# Patient Record
Sex: Female | Born: 1940 | Race: White | Hispanic: No | Smoking: Never smoker
Health system: Southern US, Community
[De-identification: ages and names within clinical notes are randomized; demographics above are authoritative.]

## PROBLEM LIST (undated history)

## (undated) DIAGNOSIS — I739 Peripheral vascular disease, unspecified: Secondary | ICD-10-CM

## (undated) DIAGNOSIS — N289 Disorder of kidney and ureter, unspecified: Secondary | ICD-10-CM

## (undated) DIAGNOSIS — N3289 Other specified disorders of bladder: Secondary | ICD-10-CM

## (undated) DIAGNOSIS — E785 Hyperlipidemia, unspecified: Secondary | ICD-10-CM

## (undated) DIAGNOSIS — M1990 Unspecified osteoarthritis, unspecified site: Secondary | ICD-10-CM

## (undated) DIAGNOSIS — M359 Systemic involvement of connective tissue, unspecified: Secondary | ICD-10-CM

## (undated) DIAGNOSIS — M810 Age-related osteoporosis without current pathological fracture: Secondary | ICD-10-CM

## (undated) DIAGNOSIS — I2510 Atherosclerotic heart disease of native coronary artery without angina pectoris: Secondary | ICD-10-CM

## (undated) DIAGNOSIS — K219 Gastro-esophageal reflux disease without esophagitis: Secondary | ICD-10-CM

## (undated) DIAGNOSIS — I779 Disorder of arteries and arterioles, unspecified: Secondary | ICD-10-CM

## (undated) DIAGNOSIS — I10 Essential (primary) hypertension: Secondary | ICD-10-CM

## (undated) DIAGNOSIS — J309 Allergic rhinitis, unspecified: Secondary | ICD-10-CM

## (undated) DIAGNOSIS — E039 Hypothyroidism, unspecified: Secondary | ICD-10-CM

## (undated) DIAGNOSIS — IMO0002 Reserved for concepts with insufficient information to code with codable children: Secondary | ICD-10-CM

## (undated) DIAGNOSIS — N819 Female genital prolapse, unspecified: Secondary | ICD-10-CM

## (undated) DIAGNOSIS — J449 Chronic obstructive pulmonary disease, unspecified: Secondary | ICD-10-CM

## (undated) DIAGNOSIS — F419 Anxiety disorder, unspecified: Secondary | ICD-10-CM

## (undated) DIAGNOSIS — J45909 Unspecified asthma, uncomplicated: Secondary | ICD-10-CM

## (undated) HISTORY — DX: Allergic rhinitis, unspecified: J30.9

## (undated) HISTORY — DX: Peripheral vascular disease, unspecified: I73.9

## (undated) HISTORY — DX: Atherosclerotic heart disease of native coronary artery without angina pectoris: I25.10

## (undated) HISTORY — DX: Gastro-esophageal reflux disease without esophagitis: K21.9

## (undated) HISTORY — DX: Essential (primary) hypertension: I10

## (undated) HISTORY — PX: COLONOSCOPY: SHX174

## (undated) HISTORY — DX: Anxiety disorder, unspecified: F41.9

## (undated) HISTORY — DX: Hyperlipidemia, unspecified: E78.5

## (undated) HISTORY — DX: Chronic obstructive pulmonary disease, unspecified: J44.9

## (undated) HISTORY — PX: HERNIA REPAIR: SHX51

## (undated) HISTORY — PX: CORONARY ARTERY BYPASS GRAFT: SHX141

## (undated) HISTORY — PX: OTHER SURGICAL HISTORY: SHX169

## (undated) HISTORY — PX: CORNEA LACERATION REPAIR: SHX355

## (undated) HISTORY — DX: Hypothyroidism, unspecified: E03.9

## (undated) HISTORY — DX: Disorder of arteries and arterioles, unspecified: I77.9

## (undated) HISTORY — PX: TUBAL LIGATION: SHX77

---

## 2012-07-29 ENCOUNTER — Ambulatory Visit: Payer: Self-pay | Admitting: Cardiology

## 2012-08-12 ENCOUNTER — Encounter: Payer: Self-pay | Admitting: Cardiology

## 2012-09-01 ENCOUNTER — Encounter: Payer: Self-pay | Admitting: Cardiology

## 2012-09-01 DIAGNOSIS — E039 Hypothyroidism, unspecified: Secondary | ICD-10-CM | POA: Insufficient documentation

## 2012-09-01 DIAGNOSIS — F419 Anxiety disorder, unspecified: Secondary | ICD-10-CM | POA: Insufficient documentation

## 2012-09-01 DIAGNOSIS — I071 Rheumatic tricuspid insufficiency: Secondary | ICD-10-CM | POA: Insufficient documentation

## 2012-09-01 DIAGNOSIS — J309 Allergic rhinitis, unspecified: Secondary | ICD-10-CM | POA: Insufficient documentation

## 2012-09-01 DIAGNOSIS — E782 Mixed hyperlipidemia: Secondary | ICD-10-CM | POA: Insufficient documentation

## 2012-09-01 DIAGNOSIS — K219 Gastro-esophageal reflux disease without esophagitis: Secondary | ICD-10-CM | POA: Insufficient documentation

## 2012-09-01 DIAGNOSIS — E785 Hyperlipidemia, unspecified: Secondary | ICD-10-CM | POA: Insufficient documentation

## 2012-09-01 DIAGNOSIS — Z951 Presence of aortocoronary bypass graft: Secondary | ICD-10-CM | POA: Insufficient documentation

## 2012-09-01 DIAGNOSIS — I2510 Atherosclerotic heart disease of native coronary artery without angina pectoris: Secondary | ICD-10-CM | POA: Insufficient documentation

## 2012-09-01 DIAGNOSIS — I10 Essential (primary) hypertension: Secondary | ICD-10-CM | POA: Insufficient documentation

## 2012-09-01 DIAGNOSIS — R9430 Abnormal result of cardiovascular function study, unspecified: Secondary | ICD-10-CM | POA: Insufficient documentation

## 2012-09-04 ENCOUNTER — Ambulatory Visit (INDEPENDENT_AMBULATORY_CARE_PROVIDER_SITE_OTHER): Payer: Medicare PPO | Admitting: Cardiology

## 2012-09-04 ENCOUNTER — Encounter: Payer: Self-pay | Admitting: Cardiology

## 2012-09-04 VITALS — BP 148/83 | HR 52 | Ht 63.5 in | Wt 140.0 lb

## 2012-09-04 DIAGNOSIS — E039 Hypothyroidism, unspecified: Secondary | ICD-10-CM

## 2012-09-04 DIAGNOSIS — R0989 Other specified symptoms and signs involving the circulatory and respiratory systems: Secondary | ICD-10-CM

## 2012-09-04 DIAGNOSIS — E785 Hyperlipidemia, unspecified: Secondary | ICD-10-CM

## 2012-09-04 DIAGNOSIS — I10 Essential (primary) hypertension: Secondary | ICD-10-CM

## 2012-09-04 DIAGNOSIS — I2510 Atherosclerotic heart disease of native coronary artery without angina pectoris: Secondary | ICD-10-CM

## 2012-09-04 DIAGNOSIS — J449 Chronic obstructive pulmonary disease, unspecified: Secondary | ICD-10-CM

## 2012-09-04 DIAGNOSIS — R9430 Abnormal result of cardiovascular function study, unspecified: Secondary | ICD-10-CM

## 2012-09-04 NOTE — Assessment & Plan Note (Signed)
The patient has known coronary disease post CABG in 2006. Her last nuclear scan was February, 2011. She had no ischemia. The ejection fraction was normal. She has no significant symptoms. She does not need a stress test at this time.

## 2012-09-04 NOTE — Assessment & Plan Note (Signed)
We know her ejection fraction is 55-65% by echo in 2011.

## 2012-09-04 NOTE — Assessment & Plan Note (Signed)
She has significant COPD. She is on medications and it is controlled at this time.

## 2012-09-04 NOTE — Patient Instructions (Addendum)
Your physician recommends that you schedule a follow-up appointment in: 6 months. You will receive a reminder letter in the mail in about 4 months reminding you to call and schedule your appointment. If you don't receive this letter, please contact our office. Your physician recommends that you continue on your current medications as directed. Please refer to the Current Medication list given to you today. Your physician has requested that you have a carotid duplex. This test is an ultrasound of the carotid arteries in your neck. It looks at blood flow through these arteries that supply the brain with blood. Allow one hour for this exam. There are no restrictions or special instructions.  

## 2012-09-04 NOTE — Assessment & Plan Note (Signed)
The patient has bilateral carotid bruits. I*she had any recent carotid Dopplers because I could not find this in the transferred records. She has not had a carotid Doppler. This will be scheduled and we will be in touch with her with the information.

## 2012-09-04 NOTE — Assessment & Plan Note (Signed)
Her lipids are being treated aggressively.

## 2012-09-04 NOTE — Progress Notes (Signed)
HPI The patient is seen as a new patient today to establish her cardiology care. We have taken care of her mother in the past. She has recently moved back into this area and wanted to be taken care of by our team. She does have known coronary disease. I have received an excellent set of records from her prior cardiology team. She underwent bypass surgery in the past. More recently she had a stress nuclear scan in 2011. It did not show any significant ischemia. She has good left ventricular function. She saw her cardiologist last in January, 2014 and she was stable. She's not having any significant symptoms. She has significant COPD. If she has any problems it is usually related to her COPD. She's going about full activities at this time.  As part of today's evaluation I carefully reviewed her outside records including prior bypass surgery records and exercise records and office notes. I have carefully updated the current electronics medical record.  Allergies  Allergen Reactions  . Ivp Dye (Iodinated Diagnostic Agents) Swelling    Kidney Dye  . Betadine (Povidone Iodine) Rash  . Codeine Nausea And Vomiting  . Other Other (See Comments)    ALL NARCOTICS  . Penicillins Rash  . Sulfa Antibiotics Rash    Current Outpatient Prescriptions  Medication Sig Dispense Refill  . acetaminophen (TYLENOL) 650 MG CR tablet Take 650 mg by mouth every 4 (four) hours.      [name] albuterol (PROVENTIL HFA;VENTOLIN HFA) 108 (90 BASE) MCG/ACT inhaler Inhale 2 puffs into the lungs every 6 (six) hours as needed for wheezing.      [name] aspirin 81 MG tablet Take 81 mg by mouth daily.      [name] atenolol (TENORMIN) 25 MG tablet Take 25 mg by mouth daily.       [name] atorvastatin (LIPITOR) 80 MG tablet Take 80 mg by mouth daily.      . citalopram (CELEXA) 10 MG tablet Take 1 tablet by mouth daily.      . clonazePAM (KLONOPIN) 0.5 MG tablet Take 0.25 mg by mouth 2 (two) times daily as needed for anxiety.      [name] esomeprazole  (NEXIUM) 40 MG capsule Take 40 mg by mouth daily before breakfast.      . fish oil-omega-3 fatty acids 1000 MG capsule Take 1 g by mouth 2 (two) times a week.      . fluticasone (FLONASE) 50 MCG/ACT nasal spray Place 2 sprays into the nose daily.      . Fluticasone-Salmeterol (ADVAIR) 250-50 MCG/DOSE AEPB Inhale 1 puff into the lungs every 12 (twelve) hours.      . fosinopril (MONOPRIL) 20 MG tablet Take 20 mg by mouth daily.      . furosemide (LASIX) 40 MG tablet Take 40 mg by mouth daily.      [name] guaiFENesin (MUCINEX) 600 MG 12 hr tablet Take 1,200 mg by mouth 2 (two) times daily as needed for congestion.      . hydrochlorothiazide (HYDRODIURIL) 25 MG tablet Take 25 mg by mouth daily.      [name] ibuprofen (ADVIL,MOTRIN) 200 MG tablet Take 400 mg by mouth every 6 (six) hours as needed for pain.      [name] levothyroxine (SYNTHROID, LEVOTHROID) 75 MCG tablet Take 75 mcg by mouth daily before breakfast.      . nitroGLYCERIN (NITROSTAT) 0.4 MG SL tablet Place 0.4 mg under the tongue every 5 (five) minutes as needed for chest pain.      [name]  potassium chloride SA (K-DUR,KLOR-CON) 20 MEQ tablet Take 20 mEq by mouth daily.      . pravastatin (PRAVACHOL) 20 MG tablet Take 20 mg by mouth daily.      [name] tiotropium (SPIRIVA) 18 MCG inhalation capsule Place 18 mcg into inhaler and inhale daily.      . vitamin B-12 (CYANOCOBALAMIN) 1000 MCG tablet Take 1,000 mcg by mouth daily.      . Vitamin D, Ergocalciferol, (DRISDOL) 50000 UNITS CAPS Take 50,000 Units by mouth every 7 (seven) days.       No current facility-administered medications for this visit.    History   Social History  . Marital Status: Legally Separated    Spouse Name: N/A    Number of Children: 5  . Years of Education: N/A   Occupational History  . retired    Social History Main Topics  . Smoking status: Never Smoker   . Smokeless tobacco: Never Used  . Alcohol Use: Yes     Comment: 1 drink 1 time a year  . Drug Use: No  . Sexually Active:  Not on file   Other Topics Concern  . Not on file   Social History Narrative  . No narrative on file    Family History  Problem Relation Age of Onset  . Cirrhosis Father   . Lung disease Father   . Diabetes Mellitus II Mother   . Heart Problems Mother     Past Medical History  Diagnosis Date  . COPD (chronic obstructive pulmonary disease)   . HTN (hypertension)   . Hypothyroidism   . GERD (gastroesophageal reflux disease)   . Allergic rhinitis   . Anxiety   . Hyperlipidemia   . CAD (coronary artery disease)     CABG 2006  //   dobutamine nuclear 2007, no scar or ischemia, EF 65%  . Hx of CABG     2006 [facility]  . Ejection fraction     EF 55-65%, echo, February, 2011  . Tricuspid regurgitation     Moderate, echo, February, 2011, PA pressure 33 mmHg    Past Surgical History  Procedure Laterality Date  . Coronary artery bypass graft    . Cornea laceration repair    . Colonoscopy    . Hernia repair      Patient Active Problem List  Diagnosis  . CAD (coronary artery disease)  . COPD (chronic obstructive pulmonary disease)  . HTN (hypertension)  . Hypothyroidism  . GERD (gastroesophageal reflux disease)  . Allergic rhinitis  . Anxiety  . Hyperlipidemia  . Hx of CABG  . Ejection fraction  . Tricuspid regurgitation    ROS  Patient denies fever, chills, headache, sweats, rash, change in vision, change in hearing, chest pain, nausea vomiting, urinary symptoms. All other systems are reviewed and are negative.  PHYSICAL EXAM Patient is oriented to person time and place. Affect is normal. There is no jugulovenous distention. There are soft bilateral carotid bruits. She has some kyphosis of the spine. There is decreased breath sounds. There is no respiratory distress. Cardiac exam reveals S1 and S2. There no clicks or significant murmurs. The abdomen is soft. There is trace peripheral edema. There are no musculoskeletal deformities. There are no skin  rashes.  Filed Vitals:   09/04/12 1402  BP: 148/83  Pulse: 52  Height: 5' 3.5" (1.613 m)  Weight: 140 lb (63.504 kg)  SpO2: 95%   EKG is done today and reviewed by me. There is  mild sinus bradycardia there are mild nonspecific ST-T wave changes.  ASSESSMENT & PLAN

## 2012-09-04 NOTE — Assessment & Plan Note (Signed)
Her blood pressures controlled. No change in therapy.

## 2012-09-04 NOTE — Assessment & Plan Note (Signed)
There is a history of hypothyroidism. She is treated for this.

## 2012-09-16 ENCOUNTER — Encounter (INDEPENDENT_AMBULATORY_CARE_PROVIDER_SITE_OTHER): Payer: Medicare PPO

## 2012-09-16 DIAGNOSIS — R0989 Other specified symptoms and signs involving the circulatory and respiratory systems: Secondary | ICD-10-CM

## 2012-09-16 DIAGNOSIS — I6529 Occlusion and stenosis of unspecified carotid artery: Secondary | ICD-10-CM

## 2012-09-27 ENCOUNTER — Encounter: Payer: Self-pay | Admitting: Cardiology

## 2012-09-28 ENCOUNTER — Telehealth: Payer: Self-pay | Admitting: Cardiology

## 2012-09-28 NOTE — Telephone Encounter (Signed)
Message copied by Eustace Moore on Mon Sep 28, 2012  4:32 PM ------      Message from: [place], [place] D      Created: Sun Sep 27, 2012  5:26 PM       Please let the patient know that there is some narrowing of the carotid arteries. It will be safe to follow this carefully over time with followup Dopplers ------

## 2012-09-28 NOTE — Telephone Encounter (Signed)
Patient informed. 

## 2012-09-28 NOTE — Telephone Encounter (Signed)
requesting test results on cartoid dopplers

## 2012-10-26 LAB — PULMONARY FUNCTION TEST

## 2012-11-11 ENCOUNTER — Encounter: Payer: Self-pay | Admitting: Obstetrics and Gynecology

## 2012-12-04 ENCOUNTER — Ambulatory Visit (INDEPENDENT_AMBULATORY_CARE_PROVIDER_SITE_OTHER): Payer: Medicare PPO | Admitting: Urology

## 2012-12-04 DIAGNOSIS — N952 Postmenopausal atrophic vaginitis: Secondary | ICD-10-CM

## 2012-12-04 DIAGNOSIS — N3946 Mixed incontinence: Secondary | ICD-10-CM

## 2012-12-04 DIAGNOSIS — N8111 Cystocele, midline: Secondary | ICD-10-CM

## 2012-12-04 DIAGNOSIS — N816 Rectocele: Secondary | ICD-10-CM

## 2013-02-04 ENCOUNTER — Other Ambulatory Visit: Payer: Self-pay | Admitting: Urology

## 2013-02-05 ENCOUNTER — Other Ambulatory Visit: Payer: Self-pay | Admitting: Urology

## 2013-02-24 DIAGNOSIS — I739 Peripheral vascular disease, unspecified: Secondary | ICD-10-CM

## 2013-02-24 DIAGNOSIS — I779 Disorder of arteries and arterioles, unspecified: Secondary | ICD-10-CM | POA: Insufficient documentation

## 2013-02-25 ENCOUNTER — Ambulatory Visit (INDEPENDENT_AMBULATORY_CARE_PROVIDER_SITE_OTHER): Payer: Medicare PPO | Admitting: Cardiology

## 2013-02-25 ENCOUNTER — Encounter: Payer: Self-pay | Admitting: Cardiology

## 2013-02-25 VITALS — BP 117/73 | HR 45 | Ht 63.0 in | Wt 133.0 lb

## 2013-02-25 DIAGNOSIS — E785 Hyperlipidemia, unspecified: Secondary | ICD-10-CM

## 2013-02-25 DIAGNOSIS — I779 Disorder of arteries and arterioles, unspecified: Secondary | ICD-10-CM

## 2013-02-25 DIAGNOSIS — I2510 Atherosclerotic heart disease of native coronary artery without angina pectoris: Secondary | ICD-10-CM

## 2013-02-25 DIAGNOSIS — I10 Essential (primary) hypertension: Secondary | ICD-10-CM

## 2013-02-25 NOTE — Assessment & Plan Note (Signed)
Lipitor treated with guidelines directed therapy.

## 2013-02-25 NOTE — Assessment & Plan Note (Signed)
Coronary disease is stable. She does not need any further evaluation at this time. She is stable. I'll see her back in one year.

## 2013-02-25 NOTE — Progress Notes (Signed)
HPI  Patient is seen today to followup coronary disease. She is established with my care in April, 2014. I had taken care of her family in the past. She had had prior care out of town and I had excellent records from then. She underwent CABG in the past. She had a stress nuclear scan in 2011 revealing no significant ischemia. When I saw her I was concerned about the possibility of carotid artery disease. We arrange for carotid Doppler. It showed 40-59% R. ICA and 1-39% LICA. The plan was for a six-month followup. I discussed this with her.  She walks daily. She's not having chest pain or shortness of breath. She does have some respiratory allergic symptoms.  Allergies  Allergen Reactions  . Ivp Dye [Iodinated Diagnostic Agents] Swelling    Kidney Dye  . Betadine [Povidone Iodine] Rash  . Codeine Nausea And Vomiting  . Other Other (See Comments)    ALL NARCOTICS  . Penicillins Rash  . Sulfa Antibiotics Rash    Current Outpatient Prescriptions  Medication Sig Dispense Refill  . acetaminophen (TYLENOL) 650 MG CR tablet Take 650 mg by mouth every 4 (four) hours.      [name] albuterol (PROVENTIL HFA;VENTOLIN HFA) 108 (90 BASE) MCG/ACT inhaler Inhale 2 puffs into the lungs every 6 (six) hours as needed for wheezing.      [name] aspirin 81 MG tablet Take 81 mg by mouth daily.      [name] atenolol (TENORMIN) 25 MG tablet Take 25 mg by mouth daily.       [name] atorvastatin (LIPITOR) 80 MG tablet Take 80 mg by mouth daily.      . citalopram (CELEXA) 10 MG tablet Take 1 tablet by mouth daily.      . clonazePAM (KLONOPIN) 0.5 MG tablet Take 0.25 mg by mouth 2 (two) times daily as needed for anxiety.      [name] esomeprazole (NEXIUM) 40 MG capsule Take 40 mg by mouth daily before breakfast.      . fish oil-omega-3 fatty acids 1000 MG capsule Take 1 g by mouth 2 (two) times a week.      . fluticasone (FLONASE) 50 MCG/ACT nasal spray Place 2 sprays into the nose daily.      . Fluticasone-Salmeterol (ADVAIR) 250-50  MCG/DOSE AEPB Inhale 1 puff into the lungs every 12 (twelve) hours.      . fosinopril (MONOPRIL) 20 MG tablet Take 20 mg by mouth daily.      . furosemide (LASIX) 40 MG tablet Take 40 mg by mouth daily.      [name] guaiFENesin (MUCINEX) 600 MG 12 hr tablet Take 1,200 mg by mouth 2 (two) times daily as needed for congestion.      . hydrochlorothiazide (HYDRODIURIL) 25 MG tablet Take 25 mg by mouth daily.      [name] ibuprofen (ADVIL,MOTRIN) 200 MG tablet Take 400 mg by mouth every 6 (six) hours as needed for pain.      [name] levothyroxine (SYNTHROID, LEVOTHROID) 75 MCG tablet Take 75 mcg by mouth daily before breakfast.      . potassium chloride SA (K-DUR,KLOR-CON) 20 MEQ tablet Take 20 mEq by mouth daily.      . pravastatin (PRAVACHOL) 20 MG tablet Take 20 mg by mouth daily.      [name] tiotropium (SPIRIVA) 18 MCG inhalation capsule Place 18 mcg into inhaler and inhale daily.      . vitamin B-12 (CYANOCOBALAMIN) 1000 MCG tablet Take 1,000 mcg by mouth daily.      [name]  Vitamin D, Ergocalciferol, (DRISDOL) 50000 UNITS CAPS Take 50,000 Units by mouth every 7 (seven) days.      . nitroGLYCERIN (NITROSTAT) 0.4 MG SL tablet Place 0.4 mg under the tongue every 5 (five) minutes as needed for chest pain.       No current facility-administered medications for this visit.    History   Social History  . Marital Status: Legally Separated    Spouse Name: N/A    Number of Children: 5  . Years of Education: N/A   Occupational History  . retired    Social History Main Topics  . Smoking status: Never Smoker   . Smokeless tobacco: Never Used  . Alcohol Use: Yes     Comment: 1 drink 1 time a year  . Drug Use: No  . Sexual Activity: Not on file   Other Topics Concern  . Not on file   Social History Narrative  . No narrative on file    Family History  Problem Relation Age of Onset  . Cirrhosis Father   . Lung disease Father   . Diabetes Mellitus II Mother   . Heart Problems Mother     Past Medical History    Diagnosis Date  . COPD (chronic obstructive pulmonary disease)   . HTN (hypertension)   . Hypothyroidism   . GERD (gastroesophageal reflux disease)   . Allergic rhinitis   . Anxiety   . Hyperlipidemia   . CAD (coronary artery disease)     CABG 2006  //   dobutamine nuclear 2007, no scar or ischemia, EF 65%  . Hx of CABG     2006 [facility]  . Ejection fraction     EF 55-65%, echo, February, 2011  . Tricuspid regurgitation     Moderate, echo, February, 2011, PA pressure 33 mmHg  . Carotid artery disease     Carotid bruit April, 2014, Doppler, 40-59% R. ICA, 0-39% LICA, mild to moderate plaque, followup 6 months    Past Surgical History  Procedure Laterality Date  . Coronary artery bypass graft    . Cornea laceration repair    . Colonoscopy    . Hernia repair      Patient Active Problem List   Diagnosis Date Noted  . Carotid artery disease   . CAD (coronary artery disease)   . COPD (chronic obstructive pulmonary disease)   . HTN (hypertension)   . Hypothyroidism   . GERD (gastroesophageal reflux disease)   . Allergic rhinitis   . Anxiety   . Hyperlipidemia   . Hx of CABG   . Ejection fraction   . Tricuspid regurgitation     ROS   Patient denies fever, chills, headache, sweats, rash, change in vision, change in hearing, chest pain, cough, nausea vomiting, urinary symptoms. All other systems are reviewed and are negative other than the history of present illness.  PHYSICAL EXAM  Patient is oriented to person time and place. Affect is normal. There is no jugulovenous distention. Lungs are clear. Respiratory effort is nonlabored. Cardiac exam reveals S1 and S2. There no clicks or significant murmurs. The abdomen is soft. Is no peripheral edema.  Filed Vitals:   02/25/13 0916  BP: 117/73  Pulse: 45  Height: 5\' 3"  (1.6 m)  Weight: 133 lb (60.328 kg)  SpO2: 97%     ASSESSMENT & PLAN

## 2013-02-25 NOTE — Assessment & Plan Note (Signed)
Her carotids are being followed carefully.

## 2013-02-25 NOTE — Patient Instructions (Signed)

## 2013-02-25 NOTE — Assessment & Plan Note (Signed)
Blood pressure is controlled. No change in therapy. 

## 2013-03-29 ENCOUNTER — Encounter (HOSPITAL_COMMUNITY): Payer: Self-pay | Admitting: Pharmacy Technician

## 2013-03-30 ENCOUNTER — Other Ambulatory Visit (HOSPITAL_COMMUNITY): Payer: Self-pay | Admitting: *Deleted

## 2013-03-31 ENCOUNTER — Encounter (HOSPITAL_COMMUNITY): Payer: Self-pay

## 2013-03-31 ENCOUNTER — Encounter (HOSPITAL_COMMUNITY)
Admission: RE | Admit: 2013-03-31 | Discharge: 2013-03-31 | Disposition: A | Discharge status: Home / Self Care | Payer: Medicare PPO | Source: Ambulatory Visit | Attending: Urology | Admitting: Urology

## 2013-03-31 ENCOUNTER — Ambulatory Visit (HOSPITAL_COMMUNITY)
Admission: RE | Admit: 2013-03-31 | Discharge: 2013-03-31 | Disposition: A | Discharge status: Home / Self Care | Payer: Medicare PPO | Source: Ambulatory Visit | Attending: Urology | Admitting: Urology

## 2013-03-31 DIAGNOSIS — I509 Heart failure, unspecified: Secondary | ICD-10-CM | POA: Insufficient documentation

## 2013-03-31 DIAGNOSIS — J449 Chronic obstructive pulmonary disease, unspecified: Secondary | ICD-10-CM | POA: Insufficient documentation

## 2013-03-31 DIAGNOSIS — J4489 Other specified chronic obstructive pulmonary disease: Secondary | ICD-10-CM | POA: Insufficient documentation

## 2013-03-31 DIAGNOSIS — Z951 Presence of aortocoronary bypass graft: Secondary | ICD-10-CM | POA: Insufficient documentation

## 2013-03-31 DIAGNOSIS — I2510 Atherosclerotic heart disease of native coronary artery without angina pectoris: Secondary | ICD-10-CM | POA: Insufficient documentation

## 2013-03-31 HISTORY — DX: Unspecified osteoarthritis, unspecified site: M19.90

## 2013-03-31 HISTORY — DX: Other specified disorders of bladder: N32.89

## 2013-03-31 HISTORY — DX: Age-related osteoporosis without current pathological fracture: M81.0

## 2013-03-31 HISTORY — DX: Reserved for concepts with insufficient information to code with codable children: IMO0002

## 2013-03-31 HISTORY — DX: Female genital prolapse, unspecified: N81.9

## 2013-03-31 LAB — CBC
HCT: 40.8 % (ref 36.0–46.0)
Hemoglobin: 13.7 g/dL (ref 12.0–15.0)
MCH: 28.7 pg (ref 26.0–34.0)
MCHC: 33.6 g/dL (ref 30.0–36.0)
MCV: 85.4 fL (ref 78.0–100.0)
Platelets: 356 10*3/uL (ref 150–400)
RBC: 4.78 MIL/uL (ref 3.87–5.11)
RDW: 13.9 % (ref 11.5–15.5)
WBC: 6.1 10*3/uL (ref 4.0–10.5)

## 2013-03-31 LAB — BASIC METABOLIC PANEL
BUN: 18 mg/dL (ref 6–23)
CO2: 31 mEq/L (ref 19–32)
Calcium: 9.7 mg/dL (ref 8.4–10.5)
Chloride: 100 mEq/L (ref 96–112)
Creatinine, Ser: 1.04 mg/dL (ref 0.50–1.10)
GFR calc Af Amer: 61 mL/min — ABNORMAL LOW (ref >90)
GFR calc non Af Amer: 52 mL/min — ABNORMAL LOW (ref >90)
Glucose, Bld: 104 mg/dL — ABNORMAL HIGH (ref 70–99)
Potassium: 3.7 mEq/L (ref 3.5–5.1)
Sodium: 138 mEq/L (ref 135–145)

## 2013-03-31 LAB — PROTIME-INR
INR: 0.91 (ref 0.00–1.49)
Prothrombin Time: 12.1 seconds (ref 11.6–15.2)

## 2013-03-31 LAB — ABO/RH: ABO/RH(D): O POS

## 2013-03-31 LAB — APTT: aPTT: 29 seconds (ref 24–37)

## 2013-03-31 NOTE — Progress Notes (Signed)
03/31/13 1107  OBSTRUCTIVE SLEEP APNEA  Have you ever been diagnosed with sleep apnea through a sleep study? No  Do you snore loudly (loud enough to be heard through closed doors)?  1  Do you often feel tired, fatigued, or sleepy during the daytime? 1  Has anyone observed you stop breathing during your sleep? 1  Do you have, or are you being treated for high blood pressure? 1  BMI more than 35 kg/m2? 0  Age over 72 years old? 1  Neck circumference greater than 40 cm/18 inches? 0  Gender: 0  Obstructive Sleep Apnea Score 5  Score 4 or greater  Results sent to PCP

## 2013-03-31 NOTE — Patient Instructions (Addendum)
Debbie [name]  03/31/2013                           YOUR PROCEDURE IS SCHEDULED ON: 04/06/13               PLEASE REPORT TO SHORT STAY CENTER AT : 5:15 AM               CALL THIS NUMBER IF ANY PROBLEMS THE DAY OF SURGERY :               [phone]                      REMEMBER:   Do not eat food or drink liquids AFTER MIDNIGHT   Take these medicines the morning of surgery with A SIP OF WATER: NEXIUM   Do not wear jewelry, make-up   Do not wear lotions, powders, or perfumes.   Do not shave legs or underarms 12 hrs. before surgery (men may shave face)  Do not bring valuables to the hospital.  Contacts, dentures or bridgework may not be worn into surgery.  Leave suitcase in the car. After surgery it may be brought to your room.  For patients admitted to the hospital more than one night, checkout time is 11:00                          The day of discharge.   Patients discharged the day of surgery will not be allowed to drive home                             If going home same day of surgery, must have someone stay with you first                           24 hrs at home and arrange for some one to drive you home from hospital.    Special Instructions:   Please read over the following fact sheets that you were given:                                  1. [place] PREPARING FOR SURGERY SHEET               2. BRING INHALERS TO HOSPITAL                                                X_____________________________________________________________________        Failure to follow these instructions may result in cancellation of your surgery

## 2013-04-05 ENCOUNTER — Encounter (HOSPITAL_COMMUNITY): Payer: Self-pay | Admitting: Anesthesiology

## 2013-04-05 ENCOUNTER — Telehealth: Payer: Self-pay | Admitting: Adult Health

## 2013-04-05 MED ORDER — DEXTROSE 5 % IV SOLN
300.0000 mg | INTRAVENOUS | Status: AC
  Administered 2013-04-06: 300 mg via INTRAVENOUS
  Filled 2013-04-05: qty 7.5

## 2013-04-05 NOTE — H&P (Signed)
History of Present Illness   Debbie [name] has symptomatic prolapse. She has urge incontinence as noted. She has had an anterior repair. She has failed a pessary. She had a grade 3 cystocele with central defect exiting the introitus. She lost vaginal length as noted. She had a negative cough test with minimal posterior defect. Her frequency is aggravated by her Lasix. Her flow was variable. I thought if she ever had surgery, she likely require a transvaginal vault suspension with cystocele repair and graft and probably not a sling. I was not convinced she had an enterocele.   Review of Systems: No change in bowel or neurologic systems.   On urodynamics, she did not void and was catheterized for 75 mL. Her maximum capacity was 189 mL. Bladder was unstable reaching pressures of 17 cmH2O not associated with leakage. She did not leak with a Valsalva pressure of 107 cmH2O. During voluntary voiding she voided 132 mL with a maximum flow of 4 mL/sec. Maximum voiding pressure was initially low, below 20 cm of water but then it became elevated as high as 63 cmH2O. Flow rate was 4 mL/sec and prolonged. Residual was 50 mL. There was a question whether or not these were unstable contractions as she was voiding. She thought it was a little bit harder to suppress the contraction if she had been standing. The details of the urodynamics are signed and dictated on the urodynamic sheet.    Past Medical History Problems  1. History of  Anxiety (Symptom) 300.00 2. History of  Arthritis V13.4 3. History of  Asthma 493.90 4. History of  Chronic Obstructive Pulmonary Disease 496 5. History of  Depression 311 6. History of  Esophageal Reflux 530.81 7. History of  Glaucoma 365.9 8. History of  Heart Disease 429.9 9. History of  Hypercholesterolemia 272.0 10. History of  Hypertension 401.9 11. History of  Hypothyroidism 244.9 12. History of  Reported A Previous Heart Murmur  Surgical History Problems  1. History of   Anterior Colporrhaphy, Repair Of Cystocele 2. History of  CABG (CABG) V45.81 3. History of  Esophagogastric Fundoplasty Nissen Fundoplication 4. History of  Hysterectomy V45.77  Current Meds 1. Advair Diskus 250-50 MCG/DOSE Inhalation Aerosol Powder Breath Activated; Therapy:  (Recorded:18Jul2014) to 2. Albuterol Sulfate (2.5 MG/3ML) 0.083% Inhalation Nebulization Solution; Therapy:  (Recorded:18Jul2014) to 3. Aspirin 81 MG Oral Tablet; Therapy: (Recorded:18Jul2014) to 4. Atenolol TABS; Therapy: (Recorded:18Jul2014) to 5. Calcium + D TABS; Therapy: (Recorded:18Jul2014) to 6. ClonazePAM 0.5 MG Oral Tablet; Therapy: (Recorded:18Jul2014) to 7. Estrace 0.1 MG/GM Vaginal Cream; 1/2 applicatorful at bedtime 3 times weekly; Therapy:  18Jul2014 to (Last Rx:18Jul2014)  Requested for: 18Jul2014 8. Flonase 50 MCG/ACT Nasal Suspension; Therapy: (Recorded:18Jul2014) to 9. Hydrochlorothiazide 25 MG Oral Tablet; Therapy: (Recorded:18Jul2014) to 10. Lasix 40 MG Oral Tablet; Therapy: (Recorded:18Jul2014) to 11. Levothyroxine Sodium 88 MCG Oral Tablet; Therapy: (Recorded:18Jul2014) to 12. Monopril 20 MG TABS; Therapy: (Recorded:18Jul2014) to 13. Omeprazole 20 MG Oral Capsule Delayed Release; Therapy: (Recorded:18Jul2014) to 14. Pravastatin Sodium 20 MG Oral Tablet; Therapy: (Recorded:18Jul2014) to 15. Spiriva HandiHaler 18 MCG Inhalation Capsule; Therapy: (Recorded:18Jul2014) to 16. Vitamin D TABS; Therapy: (Recorded:18Jul2014) to 17. ZyrTEC Allergy TABS; Therapy: (Recorded:18Jul2014) to  Allergies Medication  1. Betadine MISC 2. Contrast Media Ready-Box MISC 3. Hydrocodone-Acetaminophen CAPS 4. OxyCODONE HCl CAPS 5. Penicillins 6. Sulfa Drugs  Family History Problems  1. Family history of  Cancer 2. Family history of  Death In The Family Father 68 3. Family history of  Death In The Family  Mother 23 from heart 4. Family history of  Family Health Status Number Of Children 3 sons 2  daughters 5. Family history of  Heart Disease V17.49 6. Family history of  Hematuria 7. Family history of  Hypercholesterolemia 8. Family history of  Hypertension V17.49 9. Family history of  Kidney Cancer V16.51 10. Family history of  Prostate Cancer V16.42 11. Family history of  Renal Failure  Social History Problems  1. Caffeine Use 3-4 2. Marital History - Divorced V61.03 3. Never A Smoker 4. Occupation: Retired Nurse, children's  5. History of  Alcohol Use  Assessment Assessed  1. Urge And Stress Incontinence 788.33 2. 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 Follow-up Schedule Surgery Office  Follow-up  Done: 17Sep2014  Discussion/Summary   I drew Debbie [name] a picture. We talked about watchful waiting versus pessary versus surgery.  I talked to her about transvaginal vault suspension, cystocele repair and graft.   I drew her a picture and we talked about prolapse surgery in detail. Pros, cons, general surgical and anesthetic risks, and other options including behavioral therapy, pessaries, and watchful waiting were discussed. She understands that prolapse repairs are successful in 80-85% of cases for prolapse symptoms and can recur anteriorly, posteriorly, and/or apically. She understands that in most cases I use a graft and general risks were discussed. Surgical risks were described but not limited to the discussion of injury to neighboring structures including the bowel (with possible life-threatening sepsis and colostomy), bladder, urethra, vagina (all resulting in further surgery), and ureter (resulting in re-implantation). We talked about injury to nerves/soft tissue leading to debilitating and intractable pelvic, abdominal, and lower extremity pain syndromes and neuropathies. The risks of buttock pain, intractable dyspareunia, and vaginal narrowing and shortening with sequelae were discussed. Bleeding  risks, transfusion rates, and infection were discussed. The risk of persistent, de novo, or worsening bladder and/or bowel incontinence/dysfunction was discussed. The need for CIC was described as well the usual post-operative course. The patient understands that she might not reach her treatment goal and that she might be worse following surgery.   Debbie [name] would like to proceed with surgery. We will proceed accordingly.   After a thorough review of the management options for the patient's condition the patient  elected to proceed with surgical therapy as noted above. We have discussed the potential benefits and risks of the procedure, side effects of the proposed treatment, the likelihood of the patient achieving the goals of the procedure, and any potential problems that might occur during the procedure or recuperation. Informed consent has been obtained.

## 2013-04-05 NOTE — Anesthesia Preprocedure Evaluation (Signed)
Anesthesia Evaluation  Patient identified by MRN, date of birth, ID band Patient awake    Reviewed: Allergy & Precautions, H&P , NPO status , Patient's Chart, lab work & pertinent test results  History of Anesthesia Complications (+) history of anesthetic complications  Airway Mallampati: II TM Distance: >3 FB Neck ROM: Full    Dental no notable dental hx.    Pulmonary neg pulmonary ROS, shortness of breath, COPD COPD inhaler,  breath sounds clear to auscultation  Pulmonary exam normal       Cardiovascular hypertension, Pt. on medications + CAD, + Peripheral Vascular Disease and +CHF Rhythm:Regular Rate:Normal     Neuro/Psych negative neurological ROS  negative psych ROS   GI/Hepatic Neg liver ROS, GERD-  Medicated,  Endo/Other  Hypothyroidism   Renal/GU negative Renal ROS  negative genitourinary   Musculoskeletal negative musculoskeletal ROS (+)   Abdominal   Peds negative pediatric ROS (+)  Hematology negative hematology ROS (+)   Anesthesia Other Findings   Reproductive/Obstetrics negative OB ROS                           Anesthesia Physical Anesthesia Plan  ASA: II  Anesthesia Plan: General   Post-op Pain Management:    Induction: Intravenous  Airway Management Planned: Oral ETT  Additional Equipment:   Intra-op Plan:   Post-operative Plan: Extubation in OR  Informed Consent: I have reviewed the patients History and Physical, chart, labs and discussed the procedure including the risks, benefits and alternatives for the proposed anesthesia with the patient or authorized representative who has indicated his/her understanding and acceptance.   Dental advisory given  Plan Discussed with: CRNA  Anesthesia Plan Comments:         Anesthesia Quick Evaluation

## 2013-04-06 ENCOUNTER — Encounter (HOSPITAL_COMMUNITY)
Admission: RE | Disposition: A | Discharge status: Home / Self Care | Payer: Self-pay | Source: Ambulatory Visit | Attending: Urology

## 2013-04-06 ENCOUNTER — Ambulatory Visit (HOSPITAL_COMMUNITY)
Admission: RE | Admit: 2013-04-06 | Discharge: 2013-04-07 | Disposition: A | Discharge status: Home / Self Care | Payer: Medicare PPO | Source: Ambulatory Visit | Attending: Urology | Admitting: Urology

## 2013-04-06 ENCOUNTER — Ambulatory Visit (HOSPITAL_COMMUNITY): Payer: Medicare PPO | Admitting: Anesthesiology

## 2013-04-06 ENCOUNTER — Encounter (HOSPITAL_COMMUNITY): Payer: Medicare PPO | Admitting: Anesthesiology

## 2013-04-06 ENCOUNTER — Encounter (HOSPITAL_COMMUNITY): Payer: Self-pay | Admitting: *Deleted

## 2013-04-06 DIAGNOSIS — N3946 Mixed incontinence: Secondary | ICD-10-CM | POA: Insufficient documentation

## 2013-04-06 DIAGNOSIS — N993 Prolapse of vaginal vault after hysterectomy: Secondary | ICD-10-CM | POA: Insufficient documentation

## 2013-04-06 DIAGNOSIS — I739 Peripheral vascular disease, unspecified: Secondary | ICD-10-CM | POA: Insufficient documentation

## 2013-04-06 DIAGNOSIS — Z951 Presence of aortocoronary bypass graft: Secondary | ICD-10-CM | POA: Insufficient documentation

## 2013-04-06 DIAGNOSIS — K219 Gastro-esophageal reflux disease without esophagitis: Secondary | ICD-10-CM | POA: Insufficient documentation

## 2013-04-06 DIAGNOSIS — J4489 Other specified chronic obstructive pulmonary disease: Secondary | ICD-10-CM | POA: Insufficient documentation

## 2013-04-06 DIAGNOSIS — I2510 Atherosclerotic heart disease of native coronary artery without angina pectoris: Secondary | ICD-10-CM | POA: Insufficient documentation

## 2013-04-06 DIAGNOSIS — Z79899 Other long term (current) drug therapy: Secondary | ICD-10-CM | POA: Insufficient documentation

## 2013-04-06 DIAGNOSIS — E039 Hypothyroidism, unspecified: Secondary | ICD-10-CM | POA: Insufficient documentation

## 2013-04-06 DIAGNOSIS — E7800 Pure hypercholesterolemia, unspecified: Secondary | ICD-10-CM | POA: Insufficient documentation

## 2013-04-06 DIAGNOSIS — I10 Essential (primary) hypertension: Secondary | ICD-10-CM | POA: Insufficient documentation

## 2013-04-06 DIAGNOSIS — J449 Chronic obstructive pulmonary disease, unspecified: Secondary | ICD-10-CM | POA: Insufficient documentation

## 2013-04-06 HISTORY — PX: ANTERIOR AND POSTERIOR REPAIR: SHX5121

## 2013-04-06 HISTORY — PX: CYSTOSCOPY: SHX5120

## 2013-04-06 LAB — TYPE AND SCREEN
ABO/RH(D): O POS
Antibody Screen: NEGATIVE

## 2013-04-06 LAB — HEMOGLOBIN AND HEMATOCRIT, BLOOD
HCT: 36.4 % (ref 36.0–46.0)
Hemoglobin: 12.1 g/dL (ref 12.0–15.0)

## 2013-04-06 SURGERY — CYSTOSCOPY
Anesthesia: General | Wound class: Clean Contaminated

## 2013-04-06 MED ORDER — LACTATED RINGERS IV SOLN: INTRAVENOUS | Status: DC

## 2013-04-06 MED ORDER — NEOSTIGMINE METHYLSULFATE 1 MG/ML IJ SOLN
INTRAMUSCULAR | Status: AC
  Filled 2013-04-06: qty 10

## 2013-04-06 MED ORDER — METHYLENE BLUE 1 % INJ SOLN
INTRAMUSCULAR | Status: AC
  Filled 2013-04-06: qty 10

## 2013-04-06 MED ORDER — PROPOFOL 10 MG/ML IV BOLUS
INTRAVENOUS | Status: AC
  Filled 2013-04-06: qty 20

## 2013-04-06 MED ORDER — ONDANSETRON HCL 4 MG/2ML IJ SOLN
INTRAMUSCULAR | Status: AC
  Filled 2013-04-06: qty 2

## 2013-04-06 MED ORDER — LACTATED RINGERS IV SOLN
INTRAVENOUS | Status: DC | PRN
  Administered 2013-04-06 (×2): via INTRAVENOUS

## 2013-04-06 MED ORDER — GLYCOPYRROLATE 0.2 MG/ML IJ SOLN
INTRAMUSCULAR | Status: AC
  Filled 2013-04-06: qty 5

## 2013-04-06 MED ORDER — FUROSEMIDE 40 MG PO TABS
40.0000 mg | ORAL_TABLET | Freq: Every morning | ORAL | Status: DC
  Filled 2013-04-06 (×2): qty 1

## 2013-04-06 MED ORDER — ALBUTEROL SULFATE HFA 108 (90 BASE) MCG/ACT IN AERS
2.0000 | INHALATION_SPRAY | Freq: Four times a day (QID) | RESPIRATORY_TRACT | Status: DC | PRN
  Filled 2013-04-06: qty 6.7

## 2013-04-06 MED ORDER — METHYLENE BLUE 1 % INJ SOLN
INTRAMUSCULAR | Status: DC | PRN
  Administered 2013-04-06: 10 mL via INTRAVENOUS

## 2013-04-06 MED ORDER — ESTRADIOL 0.1 MG/GM VA CREA
TOPICAL_CREAM | VAGINAL | Status: AC
  Filled 2013-04-06: qty 85

## 2013-04-06 MED ORDER — CLONAZEPAM 0.5 MG PO TABS: 0.2500 mg | ORAL_TABLET | Freq: Two times a day (BID) | ORAL | Status: DC | PRN

## 2013-04-06 MED ORDER — HYDROCODONE-ACETAMINOPHEN 5-325 MG PO TABS: 1.0000 | ORAL_TABLET | Freq: Four times a day (QID) | ORAL | Status: DC | PRN

## 2013-04-06 MED ORDER — ONDANSETRON HCL 4 MG/2ML IJ SOLN: 4.0000 mg | INTRAMUSCULAR | Status: DC | PRN

## 2013-04-06 MED ORDER — EPHEDRINE SULFATE 50 MG/ML IJ SOLN
INTRAMUSCULAR | Status: AC
  Filled 2013-04-06: qty 1

## 2013-04-06 MED ORDER — CISATRACURIUM BESYLATE 20 MG/10ML IV SOLN
INTRAVENOUS | Status: AC
  Filled 2013-04-06: qty 10

## 2013-04-06 MED ORDER — METRONIDAZOLE IN NACL 5-0.79 MG/ML-% IV SOLN
INTRAVENOUS | Status: AC
  Filled 2013-04-06: qty 100

## 2013-04-06 MED ORDER — MIDAZOLAM HCL 2 MG/2ML IJ SOLN
INTRAMUSCULAR | Status: AC
  Filled 2013-04-06: qty 2

## 2013-04-06 MED ORDER — ONDANSETRON HCL 4 MG/2ML IJ SOLN
INTRAMUSCULAR | Status: DC | PRN
  Administered 2013-04-06: 4 mg via INTRAVENOUS

## 2013-04-06 MED ORDER — SODIUM CHLORIDE 0.9 % IJ SOLN
INTRAMUSCULAR | Status: AC
  Filled 2013-04-06: qty 10

## 2013-04-06 MED ORDER — ATENOLOL 25 MG PO TABS
25.0000 mg | ORAL_TABLET | Freq: Every day | ORAL | Status: DC
  Filled 2013-04-06 (×2): qty 1

## 2013-04-06 MED ORDER — LIDOCAINE-EPINEPHRINE (PF) 1 %-1:200000 IJ SOLN
INTRAMUSCULAR | Status: AC
  Filled 2013-04-06: qty 20

## 2013-04-06 MED ORDER — SIMVASTATIN 10 MG PO TABS
10.0000 mg | ORAL_TABLET | Freq: Every day | ORAL | Status: DC
  Administered 2013-04-06: 10 mg via ORAL
  Filled 2013-04-06 (×2): qty 1

## 2013-04-06 MED ORDER — GLYCOPYRROLATE 0.2 MG/ML IJ SOLN
INTRAMUSCULAR | Status: DC | PRN
  Administered 2013-04-06: 0.2 mg via INTRAVENOUS

## 2013-04-06 MED ORDER — TIOTROPIUM BROMIDE MONOHYDRATE 18 MCG IN CAPS
18.0000 ug | ORAL_CAPSULE | Freq: Every day | RESPIRATORY_TRACT | Status: DC
  Administered 2013-04-07: 18 ug via RESPIRATORY_TRACT
  Filled 2013-04-06: qty 5

## 2013-04-06 MED ORDER — METRONIDAZOLE IN NACL 5-0.79 MG/ML-% IV SOLN
INTRAVENOUS | Status: DC | PRN
  Administered 2013-04-06: 500 mg via INTRAVENOUS

## 2013-04-06 MED ORDER — PROMETHAZINE HCL 25 MG/ML IJ SOLN: 6.2500 mg | INTRAMUSCULAR | Status: DC | PRN

## 2013-04-06 MED ORDER — DEXAMETHASONE SODIUM PHOSPHATE 10 MG/ML IJ SOLN
INTRAMUSCULAR | Status: AC
  Filled 2013-04-06: qty 1

## 2013-04-06 MED ORDER — HYDROCHLOROTHIAZIDE 25 MG PO TABS
25.0000 mg | ORAL_TABLET | Freq: Every day | ORAL | Status: DC
  Filled 2013-04-06 (×2): qty 1

## 2013-04-06 MED ORDER — FENTANYL CITRATE 0.05 MG/ML IJ SOLN
INTRAMUSCULAR | Status: AC
  Filled 2013-04-06: qty 5

## 2013-04-06 MED ORDER — MORPHINE SULFATE 2 MG/ML IJ SOLN: 2.0000 mg | INTRAMUSCULAR | Status: DC | PRN

## 2013-04-06 MED ORDER — PROPOFOL 10 MG/ML IV BOLUS
INTRAVENOUS | Status: DC | PRN
  Administered 2013-04-06: 120 mg via INTRAVENOUS
  Administered 2013-04-06: 30 mg via INTRAVENOUS

## 2013-04-06 MED ORDER — EPHEDRINE SULFATE 50 MG/ML IJ SOLN
INTRAMUSCULAR | Status: DC | PRN
  Administered 2013-04-06 (×3): 5 mg via INTRAVENOUS

## 2013-04-06 MED ORDER — ESTRADIOL 0.1 MG/GM VA CREA
1.0000 | TOPICAL_CREAM | VAGINAL | Status: DC
  Filled 2013-04-06: qty 42.5

## 2013-04-06 MED ORDER — PROMETHAZINE HCL 12.5 MG RE SUPP: 12.5000 mg | Freq: Four times a day (QID) | RECTAL | Status: DC | PRN

## 2013-04-06 MED ORDER — LIDOCAINE-EPINEPHRINE (PF) 1 %-1:200000 IJ SOLN
INTRAMUSCULAR | Status: DC | PRN
  Administered 2013-04-06: 19 mL

## 2013-04-06 MED ORDER — MONTELUKAST SODIUM 10 MG PO TABS
10.0000 mg | ORAL_TABLET | Freq: Every day | ORAL | Status: DC
  Administered 2013-04-06: 10 mg via ORAL
  Filled 2013-04-06 (×2): qty 1

## 2013-04-06 MED ORDER — ACETAMINOPHEN 325 MG PO TABS
650.0000 mg | ORAL_TABLET | ORAL | Status: DC | PRN
  Administered 2013-04-06 – 2013-04-07 (×3): 650 mg via ORAL
  Filled 2013-04-06 (×3): qty 2

## 2013-04-06 MED ORDER — DEXAMETHASONE SODIUM PHOSPHATE 10 MG/ML IJ SOLN
INTRAMUSCULAR | Status: DC | PRN
  Administered 2013-04-06: 10 mg via INTRAVENOUS

## 2013-04-06 MED ORDER — LEVOTHYROXINE SODIUM 88 MCG PO TABS
88.0000 ug | ORAL_TABLET | Freq: Every evening | ORAL | Status: DC
  Administered 2013-04-06: 88 ug via ORAL
  Filled 2013-04-06 (×2): qty 1

## 2013-04-06 MED ORDER — LISINOPRIL 20 MG PO TABS
20.0000 mg | ORAL_TABLET | Freq: Every day | ORAL | Status: DC
  Filled 2013-04-06 (×2): qty 1

## 2013-04-06 MED ORDER — MOMETASONE FURO-FORMOTEROL FUM 100-5 MCG/ACT IN AERO
2.0000 | INHALATION_SPRAY | Freq: Two times a day (BID) | RESPIRATORY_TRACT | Status: DC
  Administered 2013-04-06 – 2013-04-07 (×2): 2 via RESPIRATORY_TRACT
  Filled 2013-04-06: qty 8.8

## 2013-04-06 MED ORDER — SODIUM CHLORIDE 0.9 % IR SOLN
Status: DC | PRN
  Administered 2013-04-06: 08:00:00

## 2013-04-06 MED ORDER — CITALOPRAM HYDROBROMIDE 10 MG PO TABS
10.0000 mg | ORAL_TABLET | Freq: Every evening | ORAL | Status: DC
  Administered 2013-04-06: 10 mg via ORAL
  Filled 2013-04-06 (×2): qty 1

## 2013-04-06 MED ORDER — CIPROFLOXACIN HCL 250 MG PO TABS: 250.0000 mg | ORAL_TABLET | Freq: Two times a day (BID) | ORAL | Status: DC

## 2013-04-06 MED ORDER — PANTOPRAZOLE SODIUM 40 MG PO TBEC
40.0000 mg | DELAYED_RELEASE_TABLET | Freq: Every day | ORAL | Status: DC
  Administered 2013-04-06 – 2013-04-07 (×2): 40 mg via ORAL
  Filled 2013-04-06 (×2): qty 1

## 2013-04-06 MED ORDER — CISATRACURIUM BESYLATE (PF) 10 MG/5ML IV SOLN
INTRAVENOUS | Status: DC | PRN
  Administered 2013-04-06: 6 mg via INTRAVENOUS

## 2013-04-06 MED ORDER — POTASSIUM CHLORIDE CRYS ER 20 MEQ PO TBCR
20.0000 meq | EXTENDED_RELEASE_TABLET | ORAL | Status: DC
  Filled 2013-04-06: qty 1

## 2013-04-06 MED ORDER — DEXTROSE-NACL 5-0.45 % IV SOLN
INTRAVENOUS | Status: DC
  Administered 2013-04-06 – 2013-04-07 (×3): via INTRAVENOUS

## 2013-04-06 MED ORDER — NITROGLYCERIN 0.4 MG SL SUBL: 0.4000 mg | SUBLINGUAL_TABLET | SUBLINGUAL | Status: DC | PRN

## 2013-04-06 MED ORDER — FENTANYL CITRATE 0.05 MG/ML IJ SOLN: 25.0000 ug | INTRAMUSCULAR | Status: DC | PRN

## 2013-04-06 MED ORDER — FENTANYL CITRATE 0.05 MG/ML IJ SOLN
INTRAMUSCULAR | Status: DC | PRN
  Administered 2013-04-06: 50 ug via INTRAVENOUS
  Administered 2013-04-06: 100 ug via INTRAVENOUS

## 2013-04-06 MED ORDER — SUCCINYLCHOLINE CHLORIDE 20 MG/ML IJ SOLN
INTRAMUSCULAR | Status: AC
  Filled 2013-04-06: qty 1

## 2013-04-06 MED ORDER — ESTRADIOL 0.1 MG/GM VA CREA
TOPICAL_CREAM | VAGINAL | Status: DC | PRN
  Administered 2013-04-06: 1 via VAGINAL

## 2013-04-06 MED ORDER — HYDROCODONE-ACETAMINOPHEN 5-325 MG PO TABS: 1.0000 | ORAL_TABLET | ORAL | Status: DC | PRN

## 2013-04-06 SURGICAL SUPPLY — 50 items
ADH SKN CLS APL DERMABOND .7 (GAUZE/BANDAGES/DRESSINGS)
BAG URINE DRAINAGE (UROLOGICAL SUPPLIES) ×2 IMPLANT
BLADE HEX COATED 2.75 (ELECTRODE) ×2 IMPLANT
BLADE SURG 15 STRL LF DISP TIS (BLADE) ×2 IMPLANT
BLADE SURG 15 STRL SS (BLADE) ×2
CANISTER SUCTION 2500CC (MISCELLANEOUS) ×2 IMPLANT
CATH FOLEY 2WAY SLVR  5CC 14FR (CATHETERS) ×1
CATH FOLEY 2WAY SLVR  5CC 16FR (CATHETERS)
CATH FOLEY 2WAY SLVR 5CC 14FR (CATHETERS) ×1 IMPLANT
CATH FOLEY 2WAY SLVR 5CC 16FR (CATHETERS) ×1 IMPLANT
COVER MAYO STAND STRL (DRAPES) IMPLANT
COVER SURGICAL LIGHT HANDLE (MISCELLANEOUS) ×2 IMPLANT
DERMABOND ADVANCED (GAUZE/BANDAGES/DRESSINGS)
DERMABOND ADVANCED .7 DNX12 (GAUZE/BANDAGES/DRESSINGS) ×1 IMPLANT
DRAIN PENROSE 18X1/4 LTX STRL (WOUND CARE) ×2 IMPLANT
GAUZE PACKING 2X5 YD STERILE (GAUZE/BANDAGES/DRESSINGS) ×4 IMPLANT
GAUZE SPONGE 4X4 16PLY XRAY LF (GAUZE/BANDAGES/DRESSINGS) ×4 IMPLANT
GLOVE BIOGEL M STRL SZ7.5 (GLOVE) ×3 IMPLANT
GOWN STRL REIN XL XLG (GOWN DISPOSABLE) ×4 IMPLANT
HOLDER FOLEY CATH W/STRAP (MISCELLANEOUS) ×2 IMPLANT
IV NS 1000ML (IV SOLUTION)
IV NS 1000ML BAXH (IV SOLUTION) ×1 IMPLANT
KIT BASIN OR (CUSTOM PROCEDURE TRAY) ×2 IMPLANT
MANIFOLD NEPTUNE II (INSTRUMENTS) ×1 IMPLANT
NEEDLE HYPO 22GX1.5 SAFETY (NEEDLE) ×1 IMPLANT
NEEDLE MAYO .5 CIRCLE (NEEDLE) ×2 IMPLANT
NS IRRIG 1000ML POUR BTL (IV SOLUTION) ×1 IMPLANT
PACK CYSTO (CUSTOM PROCEDURE TRAY) ×2 IMPLANT
PACKING VAGINAL (PACKING) ×1 IMPLANT
PENCIL BUTTON HOLSTER BLD 10FT (ELECTRODE) ×2 IMPLANT
PLUG CATH AND CAP STER (CATHETERS) ×2 IMPLANT
RETRACTOR STAY HOOK 5MM (MISCELLANEOUS) ×2 IMPLANT
SHEET LAVH (DRAPES) ×2 IMPLANT
SUT MNCRL AB 4-0 PS2 18 (SUTURE) ×2 IMPLANT
SUT SILK 2 0 30  PSL (SUTURE)
SUT SILK 2 0 30 PSL (SUTURE) ×1 IMPLANT
SUT VIC AB 0 CT1 27 (SUTURE)
SUT VIC AB 0 CT1 27XBRD ANTBC (SUTURE) ×2 IMPLANT
SUT VIC AB 2-0 CT1 27 (SUTURE) ×2
SUT VIC AB 2-0 CT1 27XBRD (SUTURE) ×1 IMPLANT
SUT VIC AB 2-0 SH 27 (SUTURE) ×4
SUT VIC AB 2-0 SH 27X BRD (SUTURE) ×6 IMPLANT
SUT VIC AB 3-0 PS2 18 (SUTURE)
SUT VIC AB 3-0 PS2 18XBRD (SUTURE) IMPLANT
SUT VIC AB 3-0 SH 27 (SUTURE) ×2
SUT VIC AB 3-0 SH 27XBRD (SUTURE) ×4 IMPLANT
SUT VICRYL 0 UR6 27IN ABS (SUTURE) ×8 IMPLANT
SYRINGE 10CC LL (SYRINGE) ×2 IMPLANT
TUBING CONNECTING 10 (TUBING) ×2 IMPLANT
YANKAUER SUCT BULB TIP 10FT TU (MISCELLANEOUS) ×2 IMPLANT

## 2013-04-06 NOTE — Interval H&P Note (Signed)
History and Physical Interval Note:  04/06/2013 6:57 AM  Debbie [name]  has presented today for surgery, with the diagnosis of CYSTOCELE AND VAULT PROLAPSE  The various methods of treatment have been discussed with the patient and family. After consideration of risks, benefits and other options for treatment, the patient has consented to  Procedure(s): CYSTOSCOPY (N/A) ANTERIOR REPAIR (CYSTOCELE) VAULT PROLAPSE WITH GRAFT (N/A) as a surgical intervention .  The patient's history has been reviewed, patient examined, no change in status, stable for surgery.  I have reviewed the patient's chart and labs.  Questions were answered to the patient's satisfaction.     [name] A   

## 2013-04-06 NOTE — Progress Notes (Signed)
With medication administration, pt had several BP medications due. When vitals were checked, BP was 95/60 with HR of 50. Called MD and was instructed to hold medications. Fraser Din, RN

## 2013-04-06 NOTE — Anesthesia Postprocedure Evaluation (Signed)
  Anesthesia Post-op Note  Patient: Debbie [name]  Procedure(s) Performed: Procedure(s) (LRB): CYSTOSCOPY (N/A) CYSTOCELE REPAIR  (N/A)  Patient Location: PACU  Anesthesia Type: General  Level of Consciousness: awake and alert   Airway and Oxygen Therapy: Patient Spontanous Breathing  Post-op Pain: mild  Post-op Assessment: Post-op Vital signs reviewed, Patient's Cardiovascular Status Stable, Respiratory Function Stable, Patent Airway and No signs of Nausea or vomiting  Last Vitals:  Filed Vitals:   04/06/13 1320  BP: 95/60  Pulse: 50  Temp: 36.3 C  Resp: 16    Post-op Vital Signs: stable   Complications: No apparent anesthesia complications

## 2013-04-06 NOTE — Care Management Note (Signed)
    Page 1 of 1   04/06/2013     12:43:42 PM   CARE MANAGEMENT NOTE 04/06/2013  Patient:  Debbie [name], Debbie [name]   Account Number:  [id number]  Date Initiated:  04/06/2013  Documentation initiated by:  Lanier Clam  Subjective/Objective Assessment:   72 Y/O F ADMITTED W/MILD VAULT PROLAPSE.     Action/Plan:   FROM HOME.   Anticipated DC Date:  04/06/2013   Anticipated DC Plan:  HOME/SELF CARE      DC Planning Services  CM consult      Choice offered to / List presented to:             Status of service:  Completed, signed off Medicare Important Message given?   (If response is "NO", the following Medicare IM given date fields will be blank) Date Medicare IM given:   Date Additional Medicare IM given:    Discharge Disposition:  HOME/SELF CARE  Per UR Regulation:  Reviewed for med. necessity/level of care/duration of stay  If discussed at Long Length of Stay Meetings, dates discussed:    Comments:  04/06/13 Jenevie Casstevens RN,BSN NCM [phone] S/P CYSTOCELE.NO NEEDS OR ORDERS.

## 2013-04-06 NOTE — Op Note (Signed)
Preoperative diagnosis: Cystocele and mild vault prolapse Postoperative diagnosis: Cystocele Surgery: Cystocele repair and cystoscopy Surgeon: Dr. Lorin Picket Jalon Squier Assistant: Pecola Leisure  The patient consented to the above procedure. Preoperative antibiotics were given. Extra care was taken with leg positioning to minimize the risk of compartment syndrome and neuropathy and deep vein thrombosis. Preoperative laboratory tests were normal  Under anesthesia her vaginal cuff had excellent support. A 3-0 Vicryl was placed at the level the cuff. She had a modest central defect and no lateral defect. It was obvious that she would do well just with an anterior repair and that she did not need a vault suspension and likely not a graft. She had minimal to no posterior defect.  I made a T-shaped anterior vaginal wall incision after instilling 20 cc of a lidocaine epinephrine mixture in the usual fashion. The incision was extended to the bladder neck region not encroaching upon it. I sharply mobilize the anterior vaginal wall from the underlying pubocervical fascia with sharp and blunt dissection to the white line bilaterally. I was happy with my mobilization at the level the apex.  I did a 2 layer anterior repair using 2-0 Vicryl suture. I was careful not to imbricate the urethra and bladder neck. I used running 2-0 Vicryl and made certain that I did not place and sutures to deep or too lateral. I was careful not to shorten the length of the anterior vaginal wall. The repair went very well and there was excellent reduction of the cystocele. The repair was not under tension.  10 mL of methylene blue was given intravenously before cystoscopy. I used the higher volume hoping to get a better blue color. Cystoscopically she had nice reduction of the cystocele and I could see both small ureteral orifices laterally with no displacement. I used a 30 lens and a 70 lens and cystoscoped the patient over many minutes  at varying bladder volumes. The ureteral orifices would open and I could see a light swirl of fluid. This was reproducible on both sides. Cystoscopically and visually and by using a white sponge at best the urine was colored a little bit light blue. There was no blood noted at the ureteral orifices. I was disappointed in the color of the urine but based upon the repair and the cystoscopic findings I did not feel it was necessary to do retrograde ureterograms or to pass a glide wire.  Also before checking the ureters and afterwards the patient had good urine output.  I trimmed an appropriate amount of anterior vaginal wall and closed the anterior vaginal wall with running 2-0 Vicryl on a CT1 needle. I was very happy with the strength the repair and in my opinion she did not need a graft.  A single Estrace cream pack was applied. Leg position was good. Urine output was good. Blood loss was less than 100 mL. Hopefully the patient will reach her treatment goal

## 2013-04-06 NOTE — Interval H&P Note (Signed)
History and Physical Interval Note:  04/06/2013 6:57 AM  Debbie [name]  has presented today for surgery, with the diagnosis of CYSTOCELE AND VAULT PROLAPSE  The various methods of treatment have been discussed with the patient and family. After consideration of risks, benefits and other options for treatment, the patient has consented to  Procedure(s): CYSTOSCOPY (N/A) ANTERIOR REPAIR (CYSTOCELE) VAULT PROLAPSE WITH GRAFT (N/A) as a surgical intervention .  The patient's history has been reviewed, patient examined, no change in status, stable for surgery.  I have reviewed the patient's chart and labs.  Questions were answered to the patient's satisfaction.     Phylliss Strege A

## 2013-04-06 NOTE — Transfer of Care (Signed)
Immediate Anesthesia Transfer of Care Note  Patient: Debbie [name]  Procedure(s) Performed: Procedure(s): CYSTOSCOPY (N/A) CYSTOCELE REPAIR  (N/A)  Patient Location: PACU  Anesthesia Type:General  Level of Consciousness: awake, sedated and patient cooperative  Airway & Oxygen Therapy: Patient Spontanous Breathing and Patient connected to face mask oxygen  Post-op Assessment: Report given to PACU RN and Post -op Vital signs reviewed and stable  Post vital signs: Reviewed and stable  Complications: No apparent anesthesia complications

## 2013-04-06 NOTE — Progress Notes (Signed)
Pt's BP is 76/38, HR 57. Pt is asymptomatic and has no complaints. MD made aware and no new orders received at this time. Pt has a hx of CHF so holding off on bolus for now. Pt's BP meds and diuretics were held earlier per MD order d/t low BP. Will continue to monitor pt.   Arta Bruce [facility] 04/06/2013

## 2013-04-06 NOTE — Progress Notes (Signed)
Patient ID: Debbie [name], female   DOB: 05-26-1940, 72 y.o.   MRN: [id number] Post-op note  Subjective: The patient is doing well.  No complaints.  Denies pain and N/V.  Her BP is a little low but she is asymptomatic.  Objective: Vital signs in last 24 hours: Temp:  [95.2 F (35.1 C)-97.7 F (36.5 C)] 97.3 F (36.3 C) (11/18 1320) Pulse Rate:  [46-58] 50 (11/18 1320) Resp:  [14-18] 16 (11/18 1320) BP: (95-137)/(43-61) 95/60 mmHg (11/18 1320) SpO2:  [95 %-100 %] 99 % (11/18 1320) Weight:  [61.689 kg (136 lb)] 61.689 kg (136 lb) (11/18 1118)  Intake/Output from previous day:   Intake/Output this shift: Total I/O In: 2600 [I.V.:2600] Out: 250 [Urine:250]  Physical Exam:  General: Alert and oriented. Abdomen: Soft, Nondistended. Incisions: Clean and dry.  Lab Results: No results found for this basename: HGB, HCT,  in the last 72 hours  Assessment/Plan: POD#0   1) Continue to monitor  2) Hold BP meds today and reassess BP tomorrow  3) DVT prophy, adv diet, amb, IS    LOS: 0 days   YARBROUGH,Birtha Hatler G. 04/06/2013, 2:10 PM

## 2013-04-07 ENCOUNTER — Encounter (HOSPITAL_COMMUNITY): Payer: Self-pay | Admitting: Urology

## 2013-04-07 LAB — BASIC METABOLIC PANEL
BUN: 9 mg/dL (ref 6–23)
CO2: 27 mEq/L (ref 19–32)
Calcium: 9 mg/dL (ref 8.4–10.5)
Chloride: 99 mEq/L (ref 96–112)
Creatinine, Ser: 0.83 mg/dL (ref 0.50–1.10)
GFR calc Af Amer: 80 mL/min — ABNORMAL LOW (ref >90)
GFR calc non Af Amer: 69 mL/min — ABNORMAL LOW (ref >90)
Glucose, Bld: 153 mg/dL — ABNORMAL HIGH (ref 70–99)
Potassium: 3.5 mEq/L (ref 3.5–5.1)
Sodium: 133 mEq/L — ABNORMAL LOW (ref 135–145)

## 2013-04-07 LAB — HEMOGLOBIN AND HEMATOCRIT, BLOOD
HCT: 34.4 % — ABNORMAL LOW (ref 36.0–46.0)
Hemoglobin: 11.5 g/dL — ABNORMAL LOW (ref 12.0–15.0)

## 2013-04-07 NOTE — Progress Notes (Signed)
Vaginal packing removed per MD order without complications. Pt tolerated procedure well.   Arta Bruce [facility]

## 2013-04-07 NOTE — Discharge Instructions (Signed)
As discussed with Dr. Dakai Braithwaite ° °You may resume aspirin, vitamins, and supplements 7 days after surgery. ° °I have reviewed discharge instructions in detail with the patient. They will follow-up with me or their physician as scheduled. My nurse will also be calling the patients as per protocol.  °

## 2013-04-07 NOTE — Progress Notes (Signed)
Patient ID: Debbie [name], female   DOB: October 22, 1940, 72 y.o.   MRN: [id number] Post-op note  Subjective: The patient is doing well.  No complaints.  Has amb and tolerating diet.   Objective: Vital signs in last 24 hours: Temp:  [95.2 F (35.1 C)-98.4 F (36.9 C)] 98.4 F (36.9 C) (11/19 0541) Pulse Rate:  [47-69] 59 (11/19 0541) Resp:  [14-20] 20 (11/19 0541) BP: (73-120)/(39-60) 120/56 mmHg (11/19 0541) SpO2:  [95 %-100 %] 97 % (11/19 0821) Weight:  [61.689 kg (136 lb)] 61.689 kg (136 lb) (11/18 1118)  Intake/Output from previous day: 11/18 0701 - 11/19 0700 In: 2600 [I.V.:2600] Out: 2350 [Urine:2350] Intake/Output this shift:    Physical Exam:  General: Alert and oriented. Abdomen: Soft, Nondistended. Incisions: Clean and dry.  Lab Results:  Recent Labs  04/06/13 1617 04/07/13 0445  HGB 12.1 11.5*  HCT 36.4 34.4*    Assessment/Plan: POD#0   1) Continue to monitor  2) Continue to hold BP meds.  It has improved, however, still a little low  3) D/c vag packing.    4) Check PVRs.  Pt has not voided yet since foley removed.  5) Likely d/c later today    LOS: 1 day   YARBROUGH,Susumu Hackler G. 04/07/2013, 8:41 AM

## 2013-04-07 NOTE — Discharge Summary (Signed)
Date of admission: 04/06/2013  Date of discharge: 04/07/2013  Admission diagnosis: Cystocele  Discharge diagnosis: Cystocele  Secondary diagnoses: Mild vault prolapse  History and Physical: For full details, please see admission history and physical. Briefly, Debbie [name] is a 72 y.o. year old patient with the above diagnosis.   Hospital Course: Surgery and post op course uneventful. Stable decrease in BP. Elevated PVR. Went home with foley  Laboratory values:  Recent Labs  04/06/13 1617 04/07/13 0445  HGB 12.1 11.5*  HCT 36.4 34.4*    Recent Labs  04/07/13 0445  CREATININE 0.83    Disposition: Home  Discharge instruction: The patient was instructed to be ambulatory but told to refrain from heavy lifting, strenuous activity, or driving. Discussed  Discharge medications:    Medication List    STOP taking these medications       aspirin 81 MG tablet     ibuprofen 200 MG tablet  Commonly known as:  ADVIL,MOTRIN     vitamin B-12 1000 MCG tablet  Commonly known as:  CYANOCOBALAMIN     Vitamin D (Ergocalciferol) 50000 UNITS Caps capsule  Commonly known as:  DRISDOL      TAKE these medications       albuterol 108 (90 BASE) MCG/ACT inhaler  Commonly known as:  PROVENTIL HFA;VENTOLIN HFA  Inhale 2 puffs into the lungs every 6 (six) hours as needed for wheezing.     atenolol 25 MG tablet  Commonly known as:  TENORMIN  Take 25 mg by mouth at bedtime.     cetirizine 10 MG tablet  Commonly known as:  ZYRTEC  Take 10 mg by mouth daily.     ciprofloxacin 250 MG tablet  Commonly known as:  CIPRO  Take 1 tablet (250 mg total) by mouth 2 (two) times daily.     citalopram 10 MG tablet  Commonly known as:  CELEXA  Take 1 tablet by mouth every evening.     clonazePAM 0.5 MG tablet  Commonly known as:  KLONOPIN  Take 0.25 mg by mouth 2 (two) times daily as needed for anxiety.     esomeprazole 20 MG capsule  Commonly known as:  NEXIUM  Take 20 mg by  mouth daily at 12 noon.     fluticasone 50 MCG/ACT nasal spray  Commonly known as:  FLONASE  Place 2 sprays into the nose daily.     Fluticasone-Salmeterol 250-50 MCG/DOSE Aepb  Commonly known as:  ADVAIR  Inhale 1 puff into the lungs every 12 (twelve) hours.     fosinopril 20 MG tablet  Commonly known as:  MONOPRIL  Take 20 mg by mouth every morning.     furosemide 40 MG tablet  Commonly known as:  LASIX  Take 40 mg by mouth every morning.     guaiFENesin 600 MG 12 hr tablet  Commonly known as:  MUCINEX  Take 1,200 mg by mouth 2 (two) times daily as needed for congestion.     hydrochlorothiazide 25 MG tablet  Commonly known as:  HYDRODIURIL  Take 25 mg by mouth daily.     HYDROcodone-acetaminophen 5-325 MG per tablet  Commonly known as:  NORCO  Take 1-2 tablets by mouth every 6 (six) hours as needed.     levothyroxine 88 MCG tablet  Commonly known as:  SYNTHROID, LEVOTHROID  Take 88 mcg by mouth every evening.     montelukast 10 MG tablet  Commonly known as:  SINGULAIR  Take 10 mg by mouth  at bedtime.     nitroGLYCERIN 0.4 MG SL tablet  Commonly known as:  NITROSTAT  Place 0.4 mg under the tongue every 5 (five) minutes as needed for chest pain.     potassium chloride SA 20 MEQ tablet  Commonly known as:  K-DUR,KLOR-CON  Take 20 mEq by mouth 2 (two) times a week.     pravastatin 20 MG tablet  Commonly known as:  PRAVACHOL  Take 20 mg by mouth at bedtime.     promethazine 12.5 MG suppository  Commonly known as:  PHENERGAN  Place 1 suppository (12.5 mg total) rectally every 6 (six) hours as needed for nausea or vomiting.     tiotropium 18 MCG inhalation capsule  Commonly known as:  SPIRIVA  Place 18 mcg into inhaler and inhale daily.        Followup:      Follow-up Information   Follow up with Avamarie Crossley A, MD. (office will call you with follow up appt date and time.)    Specialty:  Urology   Contact information:   [address]  [address] [facility] [place] [address] [phone]       Follow up with Alyssamarie Mounsey A, MD.   Specialty:  Urology   Contact information:   [address] [facility] [place] [address] [phone]

## 2013-04-07 NOTE — Progress Notes (Signed)
Foley placed per MD order. Leg bag and catheter care provided. Pt demonstrated understanding. Pt reports having a follow-up appt on Friday. Patient received discharge instructions, follow-up appts, medication lists, and when to call the doctor. All questions answered and pt verbalizes understanding. Fraser Din, RN

## 2013-04-07 NOTE — Progress Notes (Signed)
After pt void and PVR checked, PA paged per order x2. No call back received. I&O and PVRs will continue to be documented in the I&O flowsheet. Will continue to monitor. Fraser Din

## 2013-04-07 NOTE — Progress Notes (Addendum)
Spoke with Dr. Lissa Hoard this evening regarding patients low BP (he called me to discuss patient). Patient continues to look fine and has no complaints. BP up in 90's systolic. HR remains in 50's to 60's. Patients last Hgb was stable at 12.1 and there are no further orders. Debbie [name]

## 2013-04-08 NOTE — Telephone Encounter (Signed)
Wrong pt

## 2014-02-16 ENCOUNTER — Telehealth: Payer: Self-pay | Admitting: *Deleted

## 2014-02-16 ENCOUNTER — Encounter: Payer: Self-pay | Admitting: Cardiovascular Disease

## 2014-02-16 ENCOUNTER — Ambulatory Visit (INDEPENDENT_AMBULATORY_CARE_PROVIDER_SITE_OTHER): Payer: Medicare PPO | Admitting: Cardiovascular Disease

## 2014-02-16 VITALS — BP 103/66 | HR 52 | Wt 138.0 lb

## 2014-02-16 DIAGNOSIS — I25709 Atherosclerosis of coronary artery bypass graft(s), unspecified, with unspecified angina pectoris: Secondary | ICD-10-CM

## 2014-02-16 DIAGNOSIS — I779 Disorder of arteries and arterioles, unspecified: Secondary | ICD-10-CM

## 2014-02-16 DIAGNOSIS — R001 Bradycardia, unspecified: Secondary | ICD-10-CM

## 2014-02-16 DIAGNOSIS — I10 Essential (primary) hypertension: Secondary | ICD-10-CM

## 2014-02-16 DIAGNOSIS — W19XXXA Unspecified fall, initial encounter: Secondary | ICD-10-CM

## 2014-02-16 DIAGNOSIS — I209 Angina pectoris, unspecified: Secondary | ICD-10-CM

## 2014-02-16 DIAGNOSIS — I25810 Atherosclerosis of coronary artery bypass graft(s) without angina pectoris: Secondary | ICD-10-CM

## 2014-02-16 DIAGNOSIS — I739 Peripheral vascular disease, unspecified: Secondary | ICD-10-CM

## 2014-02-16 DIAGNOSIS — E785 Hyperlipidemia, unspecified: Secondary | ICD-10-CM

## 2014-02-16 DIAGNOSIS — I498 Other specified cardiac arrhythmias: Secondary | ICD-10-CM

## 2014-02-16 DIAGNOSIS — J438 Other emphysema: Secondary | ICD-10-CM

## 2014-02-16 DIAGNOSIS — R42 Dizziness and giddiness: Secondary | ICD-10-CM

## 2014-02-16 MED ORDER — ATENOLOL 25 MG PO TABS: 12.5000 mg | ORAL_TABLET | Freq: Every day | ORAL | Status: DC

## 2014-02-16 NOTE — Patient Instructions (Signed)
Your physician recommends that you schedule a follow-up appointment in: 3 weeks. Your physician has recommended you make the following change in your medication:  Decrease your atenolol to 12.5 mg daily. Please break your atenolol 25 mg in half daily. Continue all other medications the same.

## 2014-02-16 NOTE — Telephone Encounter (Signed)
Patient called to inform our office that her heart rate was 45 yesterday at Dr. Pauletta BrownsBluth's office and [facility] to confirm heart rate and it was 47. Patient was informed to hold her atenolol last night. Patient is requesting an appointment for an evaluation of her low heart rate and yearly f/u. Patient is aware that Dr. Myrtis SerKatz is retiring and wants to see another provider.

## 2014-02-16 NOTE — Progress Notes (Signed)
Patient ID: Debbie [name], female   DOB: Mar 18, 1941, 73 y.o.   MRN: [id number]      SUBJECTIVE: The patient is a 73 year old woman who is normally followed by Dr. Myrtis Ser, and saw him most recently in October 2014. She has a history of coronary artery disease and CABG, bilateral carotid artery disease, COPD, hypertension, hypothyroidism and hyperlipidemia. She recently had a fall and hurt the left side of her rib cage as well as chin and forehead. She was recently seen by her primary care physician, Dr. Loney Hering, and there her heart rate was 45 beats per minute. It was noted to be 45 beats per minute when she saw Dr. Myrtis Ser in October as well, but it does not appear she was symptomatic at that time. Recent CBC was normal. Recent ECG on 02/15/2014 demonstrated sinus bradycardia, heart rate 45 beats per minute, with a nonspecific T wave abnormality.  I reviewed her chest x-ray and there was bony demineralization noted with no evidence of a displaced rib fracture, although conventional radiography can have difficulty detecting displaced rib fractures. She does not recall all the events around the time of her fall but does recall feeling weak and dizzy. She denies antecedent chest pain. She has chest tightness approximately 3 times per month which has not required the use of nitroglycerin. She does have exertional dyspnea. Her most recent stress test was on 06/30/2009 which demonstrated no evidence of myocardial ischemia or scar with soft tissue breast attenuation artifact, calculated LVEF 69%.  She currently has chest pain when she takes a deep breath. She normally lives by herself in [place] but does have a cousin staying with her.  She is saddened by the fact that her brother has been diagnosed with terminal lung cancer.  Review of Systems: As per "subjective", otherwise negative.  Allergies  Allergen Reactions  . Ivp Dye [Iodinated Diagnostic Agents] Swelling    Kidney Dye  . Betadine [Povidone Iodine]  Rash  . Codeine Nausea And Vomiting  . Other Other (See Comments)    ALL NARCOTICS  . Penicillins Rash  . Sulfa Antibiotics Rash    Current Outpatient Prescriptions  Medication Sig Dispense Refill  . albuterol (PROVENTIL HFA;VENTOLIN HFA) 108 (90 BASE) MCG/ACT inhaler Inhale 2 puffs into the lungs every 6 (six) hours as needed for wheezing.      [name] ALPRAZolam (XANAX) 0.25 MG tablet Take 0.25 mg by mouth 2 (two) times daily as needed for anxiety.      [name] aspirin 81 MG tablet Take 81 mg by mouth daily.      [name] atenolol (TENORMIN) 25 MG tablet Take 25 mg by mouth daily.       [name] atorvastatin (LIPITOR) 80 MG tablet Take 80 mg by mouth at bedtime.      . cetirizine (ZYRTEC) 10 MG tablet Take 10 mg by mouth daily.      [name] esomeprazole (NEXIUM) 20 MG capsule Take 20 mg by mouth daily at 12 noon.      . fluticasone (FLONASE) 50 MCG/ACT nasal spray Place 2 sprays into the nose daily.      . Fluticasone-Salmeterol (ADVAIR) 250-50 MCG/DOSE AEPB Inhale 1 puff into the lungs every 12 (twelve) hours.      . fosinopril (MONOPRIL) 20 MG tablet Take 20 mg by mouth every morning.       . furosemide (LASIX) 40 MG tablet Take 40 mg by mouth every morning.       [name] guaiFENesin (MUCINEX) 600 MG 12  hr tablet Take 1,200 mg by mouth 2 (two) times daily as needed for congestion.      . hydrochlorothiazide (HYDRODIURIL) 25 MG tablet Take 25 mg by mouth daily.      [name]. levothyroxine (SYNTHROID, LEVOTHROID) 88 MCG tablet Take 88 mcg by mouth every evening.      . montelukast (SINGULAIR) 10 MG tablet Take 10 mg by mouth at bedtime.      . nitroGLYCERIN (NITROSTAT) 0.4 MG SL tablet Place 0.4 mg under the tongue every 5 (five) minutes as needed for chest pain.      . potassium chloride SA (K-DUR,KLOR-CON) 20 MEQ tablet Take 20 mEq by mouth 2 (two) times a week.       . pravastatin (PRAVACHOL) 20 MG tablet Take 20 mg by mouth at bedtime.       [name]. tiotropium (SPIRIVA) 18 MCG inhalation capsule Place 18 mcg into inhaler and inhale  daily.      [name]. amLODipine (NORVASC) 10 MG tablet Take 10 mg by mouth daily. 02/16/14 Currently not taking       No current facility-administered medications for this visit.    Past Medical History  Diagnosis Date  . COPD (chronic obstructive pulmonary disease)   . HTN (hypertension)   . Hypothyroidism   . GERD (gastroesophageal reflux disease)   . Allergic rhinitis   . Anxiety   . Hyperlipidemia   . CAD (coronary artery disease)     CABG 2006  //   dobutamine nuclear 2007, no scar or ischemia, EF 65%  . [facility]     [facility] [place]  . Ejection fraction     EF 55-65%, echo, February, 2011  . Tricuspid regurgitation     Moderate, echo, February, 2011, PA pressure 33 mmHg  . Carotid artery disease     Carotid bruit April, 2014, Doppler, 40-59% R. ICA, 0-39% LICA, mild to moderate plaque, followup 6 months  . Complication of anesthesia     slow to wake up after hernia surg  . CHF (congestive heart failure)   . Shortness of breath     with exertion  . Arthritis   . Osteoporosis   . Bladder spasms   . Frequency of urination   . Nocturia   . Cystocele   . Vaginal vault prolapse   . History of transfusion   . Thyroid disease     Past Surgical History  Procedure Laterality Date  . Cornea laceration repair    . Colonoscopy    . Hernia repair    . Cataracts      REMOVED  . Coronary artery bypass graft      X 5 VESSELS  . Tubal ligation    . Cystoscopy N/A 04/06/2013    Procedure: CYSTOSCOPY;  Surgeon: Martina SinnerScott A MacDiarmid, MD;  Location: [facility];  Service: Urology;  Laterality: N/A;  . Anterior and posterior repair N/A 04/06/2013    Procedure: CYSTOCELE REPAIR ;  Surgeon: Martina SinnerScott A MacDiarmid, MD;  Location: [facility];  Service: Urology;  Laterality: N/A;    History   Social History  . Marital Status: Legally Separated    Spouse Name: N/A    Number of Children: 5  . Years of Education: N/A   Occupational History  . retired    Social History Main Topics  .  Smoking status: Never Smoker   . Smokeless tobacco: Never Used  . Alcohol Use: No     Comment: 1 drink 1 time a  year  . Drug Use: No  . Sexual Activity: Not on file   Other Topics Concern  . Not on file   Social History Narrative  . No narrative on file     Filed Vitals:   02/16/14 1058  BP: 103/66  Pulse: 52  Weight: 138 lb (62.596 kg)  SpO2: 94%    PHYSICAL EXAM General: NAD HEENT: Normal. Neck: No JVD, no thyromegaly. Lungs: Clear to auscultation bilaterally with limited respiratory effort. CV: Nondisplaced PMI.  Bradycardic, regular rhythm, normal S1/S2, no S3/S4, no murmur. No pretibial or periankle edema.  No carotid bruit.  Normal pedal pulses.  Chest wall exquisitely tender to palpation with ecchymoses extending from left upper outer quadrant into axilla. Abdomen: Soft, nontender, no hepatosplenomegaly, no distention.  Neurologic: Alert and oriented x 3.  Psych: Normal affect. Skin: Chest wall ecchymoses. Musculoskeletal: Normal range of motion, no gross deformities. Extremities: No clubbing or cyanosis.   ECG: Most recent ECG reviewed.      ASSESSMENT AND PLAN: 1. CAD/CABG: Primarily stable ischemic heart disease. She does have COPD which likely explains her baseline exertional dyspnea which has not gotten any worse. She has not had to use nitroglycerin. Continue aspirin 81 mg and high-intensity statin therapy with Lipitor 80 mg. Given her bradycardia, I will reduce atenolol to 12.5 mg daily. I will followup with her in 3 weeks and should she still be bradycardic, I will discontinue atenolol altogether. Normal nuclear MPI study in 06/2009.  2. Essential HTN: Controlled on present therapy which includes fosinopril 20 mg daily and hydrochlorothiazide 25 mg daily.  3. Hyperlipidemia: Currently on Lipitor 80 mg daily.  4. Bilateral carotid artery disease: 40-59% RICA and 0-39% LICA stenosis on 09/16/12. Will monitor with surveillance Dopplers.  5. Fall with  bradycardia and dizziness: I will reduce atenolol to 12.5 mg daily. I will followup with her in 3 weeks and should she still be bradycardic, I will discontinue atenolol altogether.  Dispo: f/u 3 weeks.  Prentice Docker, M.D., F.A.C.C.

## 2014-03-10 ENCOUNTER — Ambulatory Visit: Payer: Medicare PPO | Admitting: Cardiovascular Disease

## 2014-03-16 ENCOUNTER — Ambulatory Visit: Payer: Medicare PPO | Admitting: Cardiovascular Disease

## 2014-03-25 ENCOUNTER — Encounter: Payer: Self-pay | Admitting: Cardiovascular Disease

## 2014-03-25 ENCOUNTER — Ambulatory Visit (INDEPENDENT_AMBULATORY_CARE_PROVIDER_SITE_OTHER): Payer: Medicare PPO | Admitting: Cardiovascular Disease

## 2014-03-25 VITALS — BP 118/68 | HR 75 | Ht 63.5 in | Wt 138.0 lb

## 2014-03-25 DIAGNOSIS — I779 Disorder of arteries and arterioles, unspecified: Secondary | ICD-10-CM

## 2014-03-25 DIAGNOSIS — J438 Other emphysema: Secondary | ICD-10-CM

## 2014-03-25 DIAGNOSIS — E785 Hyperlipidemia, unspecified: Secondary | ICD-10-CM

## 2014-03-25 DIAGNOSIS — I25810 Atherosclerosis of coronary artery bypass graft(s) without angina pectoris: Secondary | ICD-10-CM

## 2014-03-25 DIAGNOSIS — I739 Peripheral vascular disease, unspecified: Secondary | ICD-10-CM

## 2014-03-25 DIAGNOSIS — I10 Essential (primary) hypertension: Secondary | ICD-10-CM

## 2014-03-25 DIAGNOSIS — R001 Bradycardia, unspecified: Secondary | ICD-10-CM

## 2014-03-25 NOTE — Addendum Note (Signed)
Addended by: Lesle ChrisHILL, ANGELA G on: 03/25/2014 03:44 PM   Modules accepted: Level of Service

## 2014-03-25 NOTE — Progress Notes (Signed)
Patient ID: Debbie [name], female   DOB: 1940-08-17, 73 y.o.   MRN: [id number]      SUBJECTIVE: The patient follows up today for bradycardia. I reduced her atenolol to 12.5 mg daily at her last visit. She previously sustained a fall and had an injury to her rib cage. She no longer has chest pain and denies shortness of breath and palpitations, as well as dizziness and lightheadedness. She is feeling well overall.  She takes care of her cousin who is 73 years old and stays with her 5 days a week.  Review of Systems: As per "subjective", otherwise negative.  Allergies  Allergen Reactions  . Ivp Dye [Iodinated Diagnostic Agents] Swelling    Kidney Dye  . Betadine [Povidone Iodine] Rash  . Codeine Nausea And Vomiting  . Other Other (See Comments)    ALL NARCOTICS  . Penicillins Rash  . Sulfa Antibiotics Rash    Current Outpatient Prescriptions  Medication Sig Dispense Refill  . albuterol (PROVENTIL HFA;VENTOLIN HFA) 108 (90 BASE) MCG/ACT inhaler Inhale 2 puffs into the lungs every 6 (six) hours as needed for wheezing.    [name]. ALPRAZolam (XANAX) 0.25 MG tablet Take 0.25 mg by mouth 2 (two) times daily as needed for anxiety.    [name]. amLODipine (NORVASC) 10 MG tablet Take 10 mg by mouth daily. 02/16/14 Currently not taking    . aspirin 81 MG tablet Take 81 mg by mouth daily.    [name]. atenolol (TENORMIN) 25 MG tablet Take 0.5 tablets (12.5 mg total) by mouth daily. 15 tablet 3  . atorvastatin (LIPITOR) 80 MG tablet Take 80 mg by mouth at bedtime.    . cetirizine (ZYRTEC) 10 MG tablet Take 10 mg by mouth daily.    [name]. esomeprazole (NEXIUM) 20 MG capsule Take 20 mg by mouth daily at 12 noon.    . fluticasone (FLONASE) 50 MCG/ACT nasal spray Place 2 sprays into the nose daily.    . Fluticasone-Salmeterol (ADVAIR) 250-50 MCG/DOSE AEPB Inhale 1 puff into the lungs every 12 (twelve) hours.    . fosinopril (MONOPRIL) 20 MG tablet Take 20 mg by mouth every morning.     . furosemide (LASIX) 40 MG tablet  Take 40 mg by mouth every morning.     [name]. guaiFENesin (MUCINEX) 600 MG 12 hr tablet Take 1,200 mg by mouth 2 (two) times daily as needed for congestion.    . hydrochlorothiazide (HYDRODIURIL) 25 MG tablet Take 25 mg by mouth daily.    [name]. levothyroxine (SYNTHROID, LEVOTHROID) 88 MCG tablet Take 88 mcg by mouth every evening.    . montelukast (SINGULAIR) 10 MG tablet Take 10 mg by mouth at bedtime.    . potassium chloride SA (K-DUR,KLOR-CON) 20 MEQ tablet Take 20 mEq by mouth 2 (two) times a week.     . tiotropium (SPIRIVA) 18 MCG inhalation capsule Place 18 mcg into inhaler and inhale daily.     No current facility-administered medications for this visit.    Past Medical History  Diagnosis Date  . COPD (chronic obstructive pulmonary disease)   . HTN (hypertension)   . Hypothyroidism   . GERD (gastroesophageal reflux disease)   . Allergic rhinitis   . Anxiety   . Hyperlipidemia   . CAD (coronary artery disease)     CABG 2006  //   dobutamine nuclear 2007, no scar or ischemia, EF 65%  . [facility]G     [facility] [place]  . Ejection fraction  EF 55-65%, echo, February, 2011  . Tricuspid regurgitation     Moderate, echo, February, 2011, PA pressure 33 mmHg  . Carotid artery disease     Carotid bruit April, 2014, Doppler, 40-59% R. ICA, 0-39% LICA, mild to moderate plaque, followup 6 months  . Complication of anesthesia     slow to wake up after hernia surg  . CHF (congestive heart failure)   . Shortness of breath     with exertion  . Arthritis   . Osteoporosis   . Bladder spasms   . Frequency of urination   . Nocturia   . Cystocele   . Vaginal vault prolapse   . History of transfusion   . Thyroid disease     Past Surgical History  Procedure Laterality Date  . Cornea laceration repair    . Colonoscopy    . Hernia repair    . Cataracts      REMOVED  . Coronary artery bypass graft      X 5 VESSELS  . Tubal ligation    . Cystoscopy N/A 04/06/2013    Procedure:  CYSTOSCOPY;  Surgeon: Martina SinnerScott A MacDiarmid, MD;  Location: [facility];  Service: Urology;  Laterality: N/A;  . Anterior and posterior repair N/A 04/06/2013    Procedure: CYSTOCELE REPAIR ;  Surgeon: Martina SinnerScott A MacDiarmid, MD;  Location: [facility];  Service: Urology;  Laterality: N/A;    History   Social History  . Marital Status: Legally Separated    Spouse Name: N/A    Number of Children: 5  . Years of Education: N/A   Occupational History  . retired    Social History Main Topics  . Smoking status: Never Smoker   . Smokeless tobacco: Never Used  . Alcohol Use: No     Comment: 1 drink 1 time a year  . Drug Use: No  . Sexual Activity: Not on file   Other Topics Concern  . Not on file   Social History Narrative     Filed Vitals:   03/25/14 1519  BP: 118/68  Pulse: 75  Height: 5' 3.5" (1.613 m)  Weight: 138 lb (62.596 kg)  SpO2: 96%    PHYSICAL EXAM General: NAD HEENT: Normal. Neck: No JVD, no thyromegaly. Lungs: Faint inspiratory wheezes with normal respiratory effort. CV: Nondisplaced PMI.  Regular rate and rhythm, normal S1/S2, no S3/S4, no murmur. No pretibial or periankle edema.  No carotid bruit.  Normal pedal pulses.  Abdomen: Soft, nontender, no hepatosplenomegaly, no distention.  Neurologic: Alert and oriented x 3.  Psych: Normal affect. Skin: Normal. Musculoskeletal: Normal range of motion, no gross deformities. Extremities: No clubbing or cyanosis.   ECG: Most recent ECG reviewed.      ASSESSMENT AND PLAN: 1. CAD/CABG: Stable ischemic heart disease. She does have COPD which likely explains her baseline exertional dyspnea which has not gotten any worse. She has not had to use nitroglycerin. Continue aspirin 81 mg and high-intensity statin therapy with Lipitor 80 mg. Continue atenolol 12.5 mg daily. Normal nuclear MPI study in 06/2009.  2. Essential HTN: Controlled on present therapy which includes fosinopril 20 mg daily and hydrochlorothiazide 25 mg  daily.  3. Hyperlipidemia: Currently on Lipitor 80 mg daily.  4. Bilateral carotid artery disease: 40-59% RICA and 0-39% LICA stenosis on 09/16/12. Will monitor with surveillance Dopplers.  5. Fall with bradycardia and dizziness: HR now normal on atenolol 12.5 mg daily.   Dispo: f/u 6 months.   Prentice DockerSuresh Kately Graffam, M.D., F.A.C.C.

## 2014-03-25 NOTE — Patient Instructions (Signed)
Continue all current medications. Your physician wants you to follow up in: 6 months.  You will receive a reminder letter in the mail one-two months in advance.  If you don't receive a letter, please call our office to schedule the follow up appointment   

## 2014-07-20 ENCOUNTER — Telehealth: Payer: Self-pay | Admitting: *Deleted

## 2014-07-20 NOTE — Telephone Encounter (Signed)
Spoke with patient and she c/o increased sob especially with activity and with talking, chest tightness rated #4, and dizziness that started this past weekend. Patient said she doesn't currently have nitroglycerin on hand. Nurse advised patient to go to the ED for an evaluation. Patient verbalized understanding of plan.

## 2014-07-29 ENCOUNTER — Encounter: Payer: Self-pay | Admitting: *Deleted

## 2014-07-29 ENCOUNTER — Telehealth: Payer: Self-pay | Admitting: *Deleted

## 2014-07-29 NOTE — Telephone Encounter (Signed)
Patient informed and verbalized understanding of plan. 

## 2014-07-29 NOTE — Telephone Encounter (Signed)
Patient called earlier in the week with the same complaints but went to see her PCP instead of going to the ED for an evaluation. Patient said she is still sob and was informed by Dr. Pauletta BrownsBluth's office that her test showed signs that she was having issues related to her CHF. Patient said she has no energy and gets sob even when talking. Patient also c/o of having a lot of dizziness. Patient placed on levaquin 750 mg by Dr. Loney HeringBluth and said she stopped it because it made her sweat and caused her heart to start racing. [facility]

## 2014-07-29 NOTE — Telephone Encounter (Signed)
CXR did NOT show signs of CHF, with hyperinflation demonstrative of her COPD. BNP only minimally elevated, confirming that her SOB is not due to CHF. Potassium was low and she needs replacement if not already done. She may need to go to ED given SOB with talking. She may need steroids if her COPD is flaring.

## 2014-08-01 ENCOUNTER — Ambulatory Visit (INDEPENDENT_AMBULATORY_CARE_PROVIDER_SITE_OTHER): Payer: Medicare PPO | Admitting: Adult Health

## 2014-08-01 ENCOUNTER — Encounter: Payer: Self-pay | Admitting: *Deleted

## 2014-08-01 ENCOUNTER — Encounter: Payer: Self-pay | Admitting: Adult Health

## 2014-08-01 VITALS — BP 114/66 | HR 64 | Ht 63.5 in | Wt 142.0 lb

## 2014-08-01 DIAGNOSIS — R5383 Other fatigue: Secondary | ICD-10-CM

## 2014-08-01 DIAGNOSIS — R0789 Other chest pain: Secondary | ICD-10-CM

## 2014-08-01 DIAGNOSIS — R0602 Shortness of breath: Secondary | ICD-10-CM | POA: Diagnosis not present

## 2014-08-01 DIAGNOSIS — I10 Essential (primary) hypertension: Secondary | ICD-10-CM | POA: Diagnosis not present

## 2014-08-01 NOTE — Progress Notes (Deleted)
Name: Debbie [name]    DOB: [birth year]  Age: 74 y.o.  MR#: [id number]       PCP:  Ernestine Conrad, MD      Insurance: Payor: [email or web address] MEDICARE / Plan: [employer] MEDICARE CHOICE PPO / Product Type: *No Product type* /   CC:    Chief Complaint  Patient presents with  . Bradycardia  . Hypertension  . Coronary Artery Disease    VS Filed Vitals:   08/01/14 1449  BP: 114/66  Pulse: 64  Height: 5' 3.5" (1.613 m)  Weight: 142 lb (64.411 kg)  SpO2: 95%    Weights Current Weight  08/01/14 142 lb (64.411 kg)  03/25/14 138 lb (62.596 kg)  02/16/14 138 lb (62.596 kg)    Blood Pressure  BP Readings from Last 3 Encounters:  08/01/14 114/66  03/25/14 118/68  02/16/14 103/66     Admit date:  (Not on file) Last encounter with RMR:  Visit date not found   Allergy Ivp dye; Betadine; Codeine; Other; Penicillins; and Sulfa antibiotics  Current Outpatient Prescriptions  Medication Sig Dispense Refill  . albuterol (PROVENTIL HFA;VENTOLIN HFA) 108 (90 BASE) MCG/ACT inhaler Inhale 2 puffs into the lungs every 6 (six) hours as needed for wheezing.    [name] ALPRAZolam (XANAX) 0.25 MG tablet Take 0.25 mg by mouth 2 (two) times daily as needed for anxiety.    [name] amLODipine (NORVASC) 10 MG tablet Take 10 mg by mouth daily. 02/16/14 Currently not taking    . aspirin 81 MG tablet Take 81 mg by mouth daily.    [name] atorvastatin (LIPITOR) 80 MG tablet Take 80 mg by mouth at bedtime.    . cetirizine (ZYRTEC) 10 MG tablet Take 10 mg by mouth daily.    [name] esomeprazole (NEXIUM) 20 MG capsule Take 20 mg by mouth daily at 12 noon.    . fluticasone (FLONASE) 50 MCG/ACT nasal spray Place 2 sprays into the nose daily.    . Fluticasone-Salmeterol (ADVAIR) 250-50 MCG/DOSE AEPB Inhale 1 puff into the lungs every 12 (twelve) hours.    . fosinopril (MONOPRIL) 20 MG tablet Take 20 mg by mouth every morning.     . furosemide (LASIX) 40 MG tablet Take 40 mg by mouth every morning.     [name] guaiFENesin (MUCINEX) 600 MG 12 hr tablet  Take 1,200 mg by mouth 2 (two) times daily as needed for congestion.    . hydrochlorothiazide (HYDRODIURIL) 25 MG tablet Take 25 mg by mouth daily.    [name] levothyroxine (SYNTHROID, LEVOTHROID) 88 MCG tablet Take 88 mcg by mouth every evening.    . montelukast (SINGULAIR) 10 MG tablet Take 10 mg by mouth at bedtime.    . potassium chloride SA (K-DUR,KLOR-CON) 20 MEQ tablet Take 20 mEq by mouth 2 (two) times a week.     . tiotropium (SPIRIVA) 18 MCG inhalation capsule Place 18 mcg into inhaler and inhale daily.     No current facility-administered medications for this visit.    Discontinued Meds:    Medications Discontinued During This Encounter  Medication Reason  . atenolol (TENORMIN) 25 MG tablet Error    Patient Active Problem List   Diagnosis Date Noted  . Carotid artery disease   . CAD (coronary artery disease)   . COPD (chronic obstructive pulmonary disease)   . HTN (hypertension)   . Hypothyroidism   . GERD (gastroesophageal reflux disease)   . Allergic rhinitis   . Anxiety   . Hyperlipidemia   . Hx  of CABG   . Ejection fraction   . Tricuspid regurgitation     LABS    Component Value Date/Time   NA 133* 04/07/2013 0445   NA 138 03/31/2013 1135   K 3.5 04/07/2013 0445   K 3.7 03/31/2013 1135   CL 99 04/07/2013 0445   CL 100 03/31/2013 1135   CO2 27 04/07/2013 0445   CO2 31 03/31/2013 1135   GLUCOSE 153* 04/07/2013 0445   GLUCOSE 104* 03/31/2013 1135   BUN 9 04/07/2013 0445   BUN 18 03/31/2013 1135   CREATININE 0.83 04/07/2013 0445   CREATININE 1.04 03/31/2013 1135   CALCIUM 9.0 04/07/2013 0445   CALCIUM 9.7 03/31/2013 1135   GFRNONAA 69* 04/07/2013 0445   GFRNONAA 52* 03/31/2013 1135   GFRAA 80* 04/07/2013 0445   GFRAA 61* 03/31/2013 1135   CMP     Component Value Date/Time   NA 133* 04/07/2013 0445   K 3.5 04/07/2013 0445   CL 99 04/07/2013 0445   CO2 27 04/07/2013 0445   GLUCOSE 153* 04/07/2013 0445   BUN 9 04/07/2013 0445   CREATININE 0.83  04/07/2013 0445   CALCIUM 9.0 04/07/2013 0445   GFRNONAA 69* 04/07/2013 0445   GFRAA 80* 04/07/2013 0445       Component Value Date/Time   WBC 6.1 03/31/2013 1135   HGB 11.5* 04/07/2013 0445   HGB 12.1 04/06/2013 1617   HGB 13.7 03/31/2013 1135   HCT 34.4* 04/07/2013 0445   HCT 36.4 04/06/2013 1617   HCT 40.8 03/31/2013 1135   MCV 85.4 03/31/2013 1135    Lipid Panel  No results found for: CHOL, TRIG, HDL, CHOLHDL, VLDL, LDLCALC, LDLDIRECT  ABG No results found for: PHART, PCO2ART, PO2ART, HCO3, TCO2, ACIDBASEDEF, O2SAT   No results found for: TSH BNP (last 3 results) No results for input(s): BNP in the last 8760 hours.  ProBNP (last 3 results) No results for input(s): PROBNP in the last 8760 hours.  Cardiac Panel (last 3 results) No results for input(s): CKTOTAL, CKMB, TROPONINI, RELINDX in the last 72 hours.  Iron/TIBC/Ferritin/ %Sat No results found for: IRON, TIBC, FERRITIN, IRONPCTSAT   EKG Orders placed or performed in visit on 02/16/14  . EKG     Prior Assessment and Plan Problem List as of 08/01/2014      Cardiovascular and Mediastinum   CAD (coronary artery disease)   Last Assessment & Plan 02/25/2013 Office Visit Written 02/25/2013  9:40 AM by Luis AbedJeffrey D Katz, MD    Coronary disease is stable. She does not need any further evaluation at this time. She is stable. I'll see her back in one year.      HTN (hypertension)   Last Assessment & Plan 02/25/2013 Office Visit Written 02/25/2013  9:39 AM by Luis AbedJeffrey D Katz, MD    Blood pressure is controlled. No change in therapy.      Tricuspid regurgitation   Carotid artery disease   Last Assessment & Plan 02/25/2013 Office Visit Written 02/25/2013  9:40 AM by Luis AbedJeffrey D Katz, MD    Her carotids are being followed carefully.        Respiratory   COPD (chronic obstructive pulmonary disease)   Last Assessment & Plan 09/04/2012 Office Visit Written 09/04/2012  2:35 PM by Luis AbedJeffrey D Katz, MD    She has significant COPD.  She is on medications and it is controlled at this time.      Allergic rhinitis     Digestive   GERD (gastroesophageal  reflux disease)     Endocrine   Hypothyroidism   Last Assessment & Plan 09/04/2012 Office Visit Written 09/04/2012  2:36 PM by Luis Abed, MD    There is a history of hypothyroidism. She is treated for this.          Other   Anxiety   Hyperlipidemia   Last Assessment & Plan 02/25/2013 Office Visit Written 02/25/2013  9:39 AM by Luis Abed, MD    Lipitor treated with guidelines directed therapy.      Hx of CABG   Ejection fraction   Last Assessment & Plan 09/04/2012 Office Visit Written 09/04/2012  2:35 PM by Luis Abed, MD    We know her ejection fraction is 55-65% by echo in 2011.          Imaging: No results found.

## 2014-08-01 NOTE — Progress Notes (Signed)
Cardiology Office Note   Date:  08/01/2014   ID:  [name], DOB 1941-02-21, MRN [id number]  PCP:  Debbie Conrad, MD  Cardiologist:  Debbie Schlatter, NP   Chief Complaint  Patient presents with  . Bradycardia  . Hypertension  . Coronary Artery Disease      History of Present Illness: Debbie [name] is a 74 y.o. female who presents for ongoing assessment and management of bradycardia, palpitations, with known history of CAD, CABG in 2006, carotid artery disease, less than 59%, and hypertension.  She was last seen in the office in November 2015 and was stable.  She is here for six-month followup.  Since being seen last she has been admitted to [facility] for dyspnea and COPD exacerbation. She states that she was told she needed to have a cardiac test and she refused to have it in [place]. She wishes to be established with Dr. Diona [name]. She was ruled out for PE. She continues to have DOE. She follows a pulmonologist in [place] but has not seen him since discharge. She states her breathing status has worsened since coming home from [place].    Past Medical History  Diagnosis Date  . COPD (chronic obstructive pulmonary disease)   . HTN (hypertension)   . Hypothyroidism   . GERD (gastroesophageal reflux disease)   . Allergic rhinitis   . Anxiety   . Hyperlipidemia   . CAD (coronary artery disease)     CABG 2006  //   dobutamine nuclear 2007, no scar or ischemia, EF 65%  . Hx of CABG     2006 [facility]  . Ejection fraction     EF 55-65%, echo, February, 2011  . Tricuspid regurgitation     Moderate, echo, February, 2011, PA pressure 33 mmHg  . Carotid artery disease     Carotid bruit April, 2014, Doppler, 40-59% R. ICA, 0-39% LICA, mild to moderate plaque, followup 6 months  . Complication of anesthesia     slow to wake up after hernia surg  . CHF (congestive heart failure)   . Shortness of breath     with exertion  . Arthritis   .  Osteoporosis   . Bladder spasms   . Frequency of urination   . Nocturia   . Cystocele   . Vaginal vault prolapse   . History of transfusion   . Thyroid disease     Past Surgical History  Procedure Laterality Date  . Cornea laceration repair    . Colonoscopy    . Hernia repair    . Cataracts      REMOVED  . Coronary artery bypass graft      X 5 VESSELS  . Tubal ligation    . Cystoscopy N/A 04/06/2013    Procedure: CYSTOSCOPY;  Surgeon: Martina Sinner, MD;  Location: [facility];  Service: Urology;  Laterality: N/A;  . Anterior and posterior repair N/A 04/06/2013    Procedure: CYSTOCELE REPAIR ;  Surgeon: Martina Sinner, MD;  Location: [facility];  Service: Urology;  Laterality: N/A;     Current Outpatient Prescriptions  Medication Sig Dispense Refill  . albuterol (PROVENTIL HFA;VENTOLIN HFA) 108 (90 BASE) MCG/ACT inhaler Inhale 2 puffs into the lungs every 6 (six) hours as needed for wheezing.    [name] ALPRAZolam (XANAX) 0.25 MG tablet Take 0.25 mg by mouth 2 (two) times daily as needed for anxiety.    [name] amLODipine (NORVASC) 10 MG tablet Take 10 mg by  mouth daily. 02/16/14 Currently not taking    . aspirin 81 MG tablet Take 81 mg by mouth daily.    [name] atorvastatin (LIPITOR) 80 MG tablet Take 80 mg by mouth at bedtime.    . cetirizine (ZYRTEC) 10 MG tablet Take 10 mg by mouth daily.    [name] esomeprazole (NEXIUM) 20 MG capsule Take 20 mg by mouth daily at 12 noon.    . fluticasone (FLONASE) 50 MCG/ACT nasal spray Place 2 sprays into the nose daily.    . Fluticasone-Salmeterol (ADVAIR) 250-50 MCG/DOSE AEPB Inhale 1 puff into the lungs every 12 (twelve) hours.    . furosemide (LASIX) 40 MG tablet Take 40 mg by mouth every morning.     [name] guaiFENesin (MUCINEX) 600 MG 12 hr tablet Take 1,200 mg by mouth 2 (two) times daily as needed for congestion.    [name] levothyroxine (SYNTHROID, LEVOTHROID) 88 MCG tablet Take 88 mcg by mouth every evening.    . montelukast (SINGULAIR) 10 MG tablet Take 10 mg  by mouth at bedtime.    . potassium chloride SA (K-DUR,KLOR-CON) 20 MEQ tablet Take 20 mEq by mouth 2 (two) times a week.     . tiotropium (SPIRIVA) 18 MCG inhalation capsule Place 18 mcg into inhaler and inhale daily.     No current facility-administered medications for this visit.    Allergies:   Ivp dye; Betadine; Codeine; Other; Penicillins; and Sulfa antibiotics    Social History:  The patient  reports that she has never smoked. She has never used smokeless tobacco. She reports that she does not drink alcohol or use illicit drugs.   Family History:  The patient's family history includes Cirrhosis in her father; Diabetes Mellitus II in her mother; Heart Problems in her mother; Lung disease in her father.    ROS: .   All other systems are reviewed and negative.Unless otherwise mentioned in H&P above.   PHYSICAL EXAM: VS:  BP 114/66 mmHg  Pulse 64  Ht 5' 3.5" (1.613 m)  Wt 142 lb (64.411 kg)  BMI 24.76 kg/m2  SpO2 95% , BMI Body mass index is 24.76 kg/(m^2). GEN: Well nourished, well developed, in no acute distress HEENT: normal Neck: no JVD,soft  carotid bruits bilaterally Cardiac: RRR; no murmurs, rubs, or gallops,no edema  Respiratory:  Clear to auscultation bilaterally, normal work of breathing until she speaks. Mild crackles in the bases.  GI: soft, nontender, nondistended, + BS MS: no deformity or atrophy Skin: warm and dry, no rash Neuro:  Strength and sensation are intact Psych: euthymic mood, tearful. States she is upset that she is feeling worse.   Recent Labs: No results found for requested labs within last 365 days.    Lipid Panel No results found for: CHOL, TRIG, HDL, CHOLHDL, VLDL, LDLCALC, LDLDIRECT    Wt Readings from Last 3 Encounters:  08/01/14 142 lb (64.411 kg)  03/25/14 138 lb (62.596 kg)  02/16/14 138 lb (62.596 kg)      Other studies Reviewed: Additional studies/ records that were reviewed today include: Hospital records from [facility].   Review of the above records demonstrates: Need to follow up with pulmonologist.    ASSESSMENT AND PLAN:  1. CAD: Worsening symptoms of dyspnea. No evidence of CHF during recent hospital visit. She was placed on coreg 6.25 mg BID but remains bradycardic. Will stop this for now as this may be affecting her breathing status. Will schedule her for echo and Lexiscan Cardiolite this week. She wishes to follow  up with Dr. Diona BrownerMcDowell in [place]. If no evidence of ischemia, will need to be back on low dose BB unless HR remains low.   2. Hypertension: Continue amlodipine. [facility]. Continues on low dose lasix. Checking echo. Check labs to include Mag and TSH.  3. COPD: Strongly recommended to see pulmonologist for ongoing management.    Current medicines are reviewed at length with the patient today.    Labs/ tests ordered today include: Echo and Lexiscan Cardiolite. TSH, Magnesium, and BMET  Orders Placed This Encounter  Procedures  . NM Myocar Multi W/Spect W/Wall Motion / EF  . Magnesium  . Basic Metabolic Panel (BMET)  . TSH  . Myocardial Perfusion Imaging  . 2D Echocardiogram with contrast     Disposition:   FU with Dr. Diona BrownerMcDowell after tests in [place]. Signed, Joni ReiningKathryn Rick Warnick, NP  08/01/2014 3:29 PM    [facility] [address], [place], [place] [address] Phone: [phone]) [phone]; Fax: [phone]

## 2014-08-01 NOTE — Patient Instructions (Signed)
Your physician recommends that you schedule a follow-up appointment after stress test and ECHO with Dr. Diona BrownerMcDowell in [place].  Your physician recommends that you return for lab work in: Today  Your physician has requested that you have an echocardiogram. Echocardiography is a painless test that uses sound waves to create images of your heart. It provides your doctor with information about the size and shape of your heart and how well your heart's chambers and valves are working. This procedure takes approximately one hour. There are no restrictions for this procedure.  Your physician has requested that you have a lexiscan myoview. For further information please visit [email or web address]. Please follow instruction sheet, as given.  Your physician recommends that you continue on your current medications as directed. Please refer to the Current Medication list given to you today.  Thank you for choosing [facility]!

## 2014-08-02 ENCOUNTER — Ambulatory Visit: Payer: Medicare PPO | Admitting: Cardiovascular Disease

## 2014-08-02 ENCOUNTER — Telehealth: Payer: Self-pay

## 2014-08-02 LAB — BASIC METABOLIC PANEL
BUN: 15 mg/dL (ref 6–23)
CO2: 29 mEq/L (ref 19–32)
Calcium: 9.1 mg/dL (ref 8.4–10.5)
Chloride: 102 mEq/L (ref 96–112)
Creat: 1 mg/dL (ref 0.50–1.10)
Glucose, Bld: 88 mg/dL (ref 70–99)
Potassium: 3.7 mEq/L (ref 3.5–5.3)
Sodium: 141 mEq/L (ref 135–145)

## 2014-08-02 LAB — TSH: TSH: 14.11 u[IU]/mL — ABNORMAL HIGH (ref 0.350–4.500)

## 2014-08-02 LAB — MAGNESIUM: Magnesium: 2 mg/dL (ref 1.5–2.5)

## 2014-08-02 NOTE — Telephone Encounter (Signed)
-----   Message from Jodelle GrossKathryn M Lawrence, NP sent at 08/02/2014  7:01 AM EDT ----- TSH is very high.She is on thyroid medication but will need to see PCP ASAP for medication adjustment. This may be cause of all of her symptoms of fatigue. Other labs are normal. She is to have stress test and echo sometime this week. Please send lab results to Dr. Sherril CroonVyas PCP and have her make appt to see him concerning thyroid medication adjustment. [email or web address]

## 2014-08-02 NOTE — Telephone Encounter (Signed)
Spoke with pt,pcp is Dr Esaw GrandchildBluth,spoke with his nurse Tammy and faxed lab

## 2014-08-03 ENCOUNTER — Encounter (HOSPITAL_COMMUNITY)
Admission: RE | Admit: 2014-08-03 | Discharge: 2014-08-03 | Disposition: A | Discharge status: Home / Self Care | Payer: Medicare HMO | Source: Ambulatory Visit | Attending: Adult Health | Admitting: Adult Health

## 2014-08-03 ENCOUNTER — Ambulatory Visit (HOSPITAL_COMMUNITY)
Admission: RE | Admit: 2014-08-03 | Discharge: 2014-08-03 | Disposition: A | Discharge status: Home / Self Care | Payer: Medicare PPO | Source: Ambulatory Visit | Attending: Adult Health | Admitting: Adult Health

## 2014-08-03 ENCOUNTER — Encounter (HOSPITAL_COMMUNITY): Payer: Self-pay

## 2014-08-03 DIAGNOSIS — R0602 Shortness of breath: Secondary | ICD-10-CM

## 2014-08-03 DIAGNOSIS — I2510 Atherosclerotic heart disease of native coronary artery without angina pectoris: Secondary | ICD-10-CM | POA: Insufficient documentation

## 2014-08-03 DIAGNOSIS — R0789 Other chest pain: Secondary | ICD-10-CM

## 2014-08-03 DIAGNOSIS — R0600 Dyspnea, unspecified: Secondary | ICD-10-CM | POA: Diagnosis not present

## 2014-08-03 DIAGNOSIS — R0609 Other forms of dyspnea: Secondary | ICD-10-CM | POA: Diagnosis not present

## 2014-08-03 HISTORY — DX: Disorder of kidney and ureter, unspecified: N28.9

## 2014-08-03 HISTORY — DX: Unspecified asthma, uncomplicated: J45.909

## 2014-08-03 HISTORY — DX: Systemic involvement of connective tissue, unspecified: M35.9

## 2014-08-03 MED ORDER — TECHNETIUM TC 99M SESTAMIBI - CARDIOLITE
10.0000 | Freq: Once | INTRAVENOUS | Status: AC | PRN
  Administered 2014-08-03: 08:00:00 10 via INTRAVENOUS

## 2014-08-03 MED ORDER — SODIUM CHLORIDE 0.9 % IJ SOLN
10.0000 mL | INTRAMUSCULAR | Status: DC | PRN
  Administered 2014-08-03: 10 mL via INTRAVENOUS
  Filled 2014-08-03: qty 10

## 2014-08-03 MED ORDER — SODIUM CHLORIDE 0.9 % IJ SOLN
INTRAMUSCULAR | Status: AC
  Administered 2014-08-03: 10 mL via INTRAVENOUS
  Filled 2014-08-03: qty 3

## 2014-08-03 MED ORDER — TECHNETIUM TC 99M SESTAMIBI GENERIC - CARDIOLITE
30.0000 | Freq: Once | INTRAVENOUS | Status: AC | PRN
  Administered 2014-08-03: 30 via INTRAVENOUS

## 2014-08-03 MED ORDER — REGADENOSON 0.4 MG/5ML IV SOLN
INTRAVENOUS | Status: AC
  Administered 2014-08-03: 0.4 mg via INTRAVENOUS
  Filled 2014-08-03: qty 5

## 2014-08-03 MED ORDER — REGADENOSON 0.4 MG/5ML IV SOLN
0.4000 mg | Freq: Once | INTRAVENOUS | Status: AC | PRN
  Administered 2014-08-03: 0.4 mg via INTRAVENOUS

## 2014-08-03 NOTE — Progress Notes (Signed)
Stress Lab Nurses Notes - Jeani Hawkingnnie Penn  Marcelino Dusterlizabeth C Emond 08/03/2014 Reason for doing test: CAD and Dyspnea Type of test: Marlane HatcherLexiscan Cardiolite Nurse performing test: Parke PoissonPhyllis Billingsly, RN Nuclear Medicine Tech: Lyndel Pleasureyan Liles Echo Tech: Not Applicable MD performing test: Branch/M.Lenze NP Family MD: Dr. Loney HeringBluth Test explained and consent signed: Yes.   IV started: Saline lock flushed, No redness or edema and Saline lock started in radiology Symptoms: dizziness & chest tightness Treatment/Intervention: None Reason test stopped: protocol completed After recovery IV was: Discontinued via X-ray tech and No redness or edema Patient to return to Nuc. Med at : 10:45 Patient discharged: Home Patient's Condition upon discharge was: stable Comments: During test BP 119/81 & HR 86 .  Recovery BP 101/72 & HR 65.  Symptoms resolved in recovery. Erskine SpeedBillingsley, Finnleigh Marchetti T

## 2014-08-03 NOTE — Progress Notes (Signed)
  Echocardiogram 2D Echocardiogram has been performed.  Debbie [name], Debbie [name] J 08/03/2014, 12:06 PM

## 2014-08-08 ENCOUNTER — Telehealth: Payer: Self-pay | Admitting: Cardiology

## 2014-08-08 NOTE — Telephone Encounter (Signed)
Patient has questions regarding her medications / tg

## 2014-08-09 NOTE — Telephone Encounter (Signed)
Pt wants to know if she should continue taking atenolol.I don't see it listed as a current medication.She also wants to change MD to Dr [facility],states she can't understand her current cardiologist

## 2014-08-11 NOTE — Telephone Encounter (Signed)
Patient was on carvediolol and atenolol in the past. These were stopped due to bradycardia. She should stop both for now.  Discuss further medication management with whoever the cardiologist she is assigned.

## 2014-08-11 NOTE — Telephone Encounter (Signed)
Spoke with patient,will remain off atenolol

## 2014-08-17 ENCOUNTER — Ambulatory Visit: Payer: Medicare PPO | Admitting: Cardiovascular Disease

## 2014-09-12 ENCOUNTER — Ambulatory Visit: Payer: Medicare PPO | Admitting: Cardiovascular Disease

## 2014-09-15 ENCOUNTER — Ambulatory Visit (INDEPENDENT_AMBULATORY_CARE_PROVIDER_SITE_OTHER): Payer: Medicare HMO | Admitting: Cardiology

## 2014-09-15 ENCOUNTER — Encounter: Payer: Self-pay | Admitting: Cardiology

## 2014-09-15 ENCOUNTER — Other Ambulatory Visit: Payer: Self-pay | Admitting: *Deleted

## 2014-09-15 VITALS — BP 102/70 | HR 81 | Ht 63.5 in | Wt 137.8 lb

## 2014-09-15 DIAGNOSIS — I10 Essential (primary) hypertension: Secondary | ICD-10-CM

## 2014-09-15 DIAGNOSIS — I25810 Atherosclerosis of coronary artery bypass graft(s) without angina pectoris: Secondary | ICD-10-CM

## 2014-09-15 DIAGNOSIS — R002 Palpitations: Secondary | ICD-10-CM

## 2014-09-15 DIAGNOSIS — I739 Peripheral vascular disease, unspecified: Secondary | ICD-10-CM

## 2014-09-15 DIAGNOSIS — R0602 Shortness of breath: Secondary | ICD-10-CM

## 2014-09-15 DIAGNOSIS — I779 Disorder of arteries and arterioles, unspecified: Secondary | ICD-10-CM

## 2014-09-15 NOTE — Patient Instructions (Signed)
Your physician recommends that you continue on your current medications as directed. Please refer to the Current Medication list given to you today. Your physician has requested that you have a carotid duplex. This test is an ultrasound of the carotid arteries in your neck. It looks at blood flow through these arteries that supply the brain with blood. Allow one hour for this exam. There are no restrictions or special instructions. Your physician has recommended that you wear an event monitor. Event monitors are medical devices that record the heart's electrical activity. Doctors most often us these monitors to diagnose arrhythmias. Arrhythmias are problems with the speed or rhythm of the heartbeat. The monitor is a small, portable device. You can wear one while you do your normal daily activities. This is usually used to diagnose what is causing palpitations/syncope (passing out). Your physician recommends that you schedule a follow-up appointment in: 1 month with Dr. Diona BrownerMcDowell.

## 2014-09-15 NOTE — Progress Notes (Signed)
Cardiology Office Note  Date: 09/15/2014   ID: Debbie [name], DOB 09-19-1940, MRN [id number]  PCP: Ernestine ConradBLUTH, KIRK, MD  Primary Cardiologist: Nona DellSamuel Izzabelle Bouley, MD   Chief Complaint  Patient presents with  . Follow-up testing    History of Present Illness: Debbie [name] is a 74 y.o. 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 She was followed in the past by Dr. Myrtis SerKatz, most recently Dr. Purvis SheffieldKoneswaran. When seen by Ms. Lawrence NP recently in March, she was reporting worsening shortness of breath. Follow-up testing was arranged including an echocardiogram as well as a [employer] Corporation[employer]an Cardiolite, results are detailed below. Lab work was also obtained, most significant abnormality being an elevated TSH of 14.  Today we discussed her recent testing which was reassuring showing normal LVEF and no significant ischemic defects. She is having her thyroid regimen adjusted by Dr. Loney HeringBluth. Shortness of breath seems to be fairly chronic, not worse recently. Her main complaint is of a feeling of intermittent palpitations that has been bothersome, sometimes associated with shortness of breath and chest pain. She has recently been noted to be in sinus rhythm by ECGs.  She states that she tries to stay active, walks a mile most days a week and also [facility].  Past Medical History  Diagnosis Date  . COPD (chronic obstructive pulmonary disease)   . Essential hypertension   . Hypothyroidism   . GERD (gastroesophageal reflux disease)   . Allergic rhinitis   . Anxiety   . Hyperlipidemia   . CAD (coronary artery disease)     [facility]  . Carotid artery disease   . Arthritis   . Osteoporosis   . Bladder spasms   . Cystocele   . Vaginal vault prolapse   . Hypothyroidism   . Renal insufficiency   . Collagen vascular disease   . Asthma     Past Surgical  History  Procedure Laterality Date  . Cornea laceration repair    . Colonoscopy    . Hernia repair    . Cataracts      REMOVED  . Coronary artery bypass graft      X 5 VESSELS  . Tubal ligation    . Cystoscopy N/A 04/06/2013    Procedure: CYSTOSCOPY;  Surgeon: Martina SinnerScott A MacDiarmid, MD;  [facility];  Service: Urology;  Laterality: N/A;  . Anterior and posterior repair N/A 04/06/2013    Procedure: CYSTOCELE REPAIR ;  Surgeon: Martina SinnerScott A MacDiarmid, MD;  Location: [facility];  Service: Urology;  Laterality: N/A;    Current Outpatient Prescriptions  Medication Sig Dispense Refill  . albuterol (PROVENTIL HFA;VENTOLIN HFA) 108 (90 BASE) MCG/ACT inhaler Inhale 2 puffs into the lungs every 6 (six) hours as needed for wheezing.    [name]. ALPRAZolam (XANAX) 0.25 MG tablet Take 0.25 mg by mouth 2 (two) times daily as needed for anxiety.    [name]. amLODipine (NORVASC) 10 MG tablet Take 10 mg by mouth daily. 02/16/14 Currently not taking    . aspirin 81 MG tablet Take 81 mg by mouth daily.    [name]. atorvastatin (LIPITOR) 80 MG tablet Take 80 mg by mouth at bedtime.    . cetirizine (ZYRTEC) 10 MG tablet Take 10 mg by mouth daily.    [name]. esomeprazole (NEXIUM) 20 MG capsule Take 20 mg by  mouth daily at 12 noon.    . fluticasone (FLONASE) 50 MCG/ACT nasal spray Place 2 sprays into the nose daily.    . Fluticasone-Salmeterol (ADVAIR) 250-50 MCG/DOSE AEPB Inhale 1 puff into the lungs every 12 (twelve) hours.    . furosemide (LASIX) 40 MG tablet Take 40 mg by mouth every morning.     [name] guaiFENesin (MUCINEX) 600 MG 12 hr tablet Take 1,200 mg by mouth 2 (two) times daily as needed for congestion.    [name] levothyroxine (SYNTHROID, LEVOTHROID) 88 MCG tablet Take 88 mcg by mouth every evening.    . montelukast (SINGULAIR) 10 MG tablet Take 10 mg by mouth at bedtime.    . potassium chloride SA (K-DUR,KLOR-CON) 20 MEQ tablet Take 20 mEq by mouth 2 (two) times a week.     . tiotropium (SPIRIVA) 18 MCG inhalation capsule Place 18 mcg  into inhaler and inhale daily.     No current facility-administered medications for this visit.    Allergies:  Ivp dye; Betadine; Codeine; Other; Penicillins; and Sulfa antibiotics   Social History: The patient  reports that she has never smoked. She has never used smokeless tobacco. She reports that she does not drink alcohol or use illicit drugs.   Family History: The patient's family history includes Cirrhosis in her father; Diabetes Mellitus II in her mother; Heart Problems in her mother; Lung disease in her father.   ROS:  Please see the history of present illness. Otherwise, complete review of systems is positive for mild arthritic pains, chronic dyspnea on exertion.  All other systems are reviewed and negative.   Physical Exam: VS:  BP 102/70 mmHg  Pulse 81  Ht 5' 3.5" (1.613 m)  Wt 137 lb 12.8 oz (62.506 kg)  BMI 24.02 kg/m2  SpO2 98%, BMI Body mass index is 24.02 kg/(m^2).  Wt Readings from Last 3 Encounters:  09/15/14 137 lb 12.8 oz (62.506 kg)  08/01/14 142 lb (64.411 kg)  03/25/14 138 lb (62.596 kg)     General: Patient appears comfortable at rest. HEENT: Conjunctiva and lids normal, oropharynx clear. Neck: Supple, no elevated JVP, right carotid bruit, no thyromegaly. Lungs: Decreased breath sounds without wheezing, nonlabored breathing at rest. Cardiac: Regular rate and rhythm, no S3 or significant systolic murmur, no pericardial rub. Abdomen: Soft, nontender, bowel sounds present, no guarding or rebound. Extremities: No pitting edema, distal pulses 2+. Skin: Warm and dry. Musculoskeletal: No kyphosis. Neuropsychiatric: Alert and oriented x3, affect grossly appropriate.   ECG: ECG is not ordered today.   Recent Labwork: 08/01/2014: BUN 15; Creatinine 1.00; Magnesium 2.0; Potassium 3.7; Sodium 141; TSH 14.110*   Other Studies Reviewed Today:  1. Echocardiogram 08/03/2014: Study Conclusions  - Left ventricle: The cavity size was normal. There was  mild concentric hypertrophy. Systolic function was normal. The estimated ejection fraction was in the range of 60% to 65%. Wall motion was normal; there were no regional wall motion abnormalities. Doppler parameters are consistent with abnormal left ventricular relaxation (grade 1 diastolic dysfunction). Doppler parameters are consistent with high ventricular filling pressure. - Aortic valve: Mildly calcified annulus. Trileaflet. - Mitral valve: Mildly calcified annulus. - Tricuspid valve: There was mild-moderate regurgitation. - Pulmonary arteries: PA peak pressure: 35 mm Hg (S). Mildly elevated pulmonary pressures.  2. Lexiscan Cardiolite 08/03/2014: FINDINGS: Stress data: The patient was stressed according to the Lexiscan protocol. The heart rate ranged from 53 up to 86 beats per min. The blood pressure averaged 133/69. The patient experienced non limiting chest  pain.  Baseline ECG demonstrated sinus rhythm with a diffuse nonspecific T-wave abnormality. There were more pronounced T-wave inversion is with Lexiscan infusion. No arrhythmias were noted this is a nondiagnostic ECG.  Perfusion: No decreased activity in the left ventricle on stress imaging to suggest reversible ischemia or infarction.  Wall Motion: Normal left ventricular wall motion. No left ventricular dilation.  Left Ventricular Ejection Fraction: 54 %  End diastolic volume 43 ml  End systolic volume 20 ml  IMPRESSION: 1. No reversible ischemia or infarction.  2. Normal left ventricular wall motion.  3. Left ventricular ejection fraction 54%  4. Low-risk stress test findings*.  3. Chest x-ray 07/26/2014 [facility]) reported hyperinflation without active process.  4. Carotid Dopplers 09/16/2012: Mild to moderate heterogeneous plaque bilaterally with 40-59% RICA stenosis and 0-39% LICA stenosis.  Assessment and Plan:  1. Shortness of breath, seems to be fairly chronic and likely  multifactorial particular with COPD. Recent ischemic workup was reassuring and LVEF remains normal range. I have recommended that she continue her regular exercise plan and follow-up with primary care. We made no specific changes to her current cardiac regimen.  2. Palpitations associated with shortness of breath and chest pain at times, no obvious precipitant. She states that this has been worse over the last 6 months. She is also off atenolol now, tells me that this was stopped due to bradycardia previously. We will obtain a cardiac monitor to further investigate.  3. Carotid artery disease, last carotid Doppler was in 2014 as outlined above. Repeat studies will be obtained.  4. Essential hypertension, blood pressure is normal today.  Current medicines were reviewed with the patient today.   Orders Placed This Encounter  Procedures  . Cardiac event monitor    Disposition: FU with me in 1 month.   Signed, Jonelle Sidle, MD, [facility] 09/15/2014 1:59 PM    [facility] at [facility] [address] [place], [place], [place] [address] Phone: [phone]; Fax: [phone]

## 2014-09-20 ENCOUNTER — Emergency Department (HOSPITAL_COMMUNITY): Payer: Medicare HMO

## 2014-09-20 ENCOUNTER — Observation Stay (HOSPITAL_COMMUNITY)
Admission: EM | Admit: 2014-09-20 | Discharge: 2014-09-21 | Disposition: A | Discharge status: Home / Self Care | Payer: Medicare HMO | Attending: Internal Medicine | Admitting: Internal Medicine

## 2014-09-20 ENCOUNTER — Encounter (HOSPITAL_COMMUNITY): Payer: Self-pay

## 2014-09-20 DIAGNOSIS — Z880 Allergy status to penicillin: Secondary | ICD-10-CM | POA: Diagnosis not present

## 2014-09-20 DIAGNOSIS — Z8742 Personal history of other diseases of the female genital tract: Secondary | ICD-10-CM | POA: Insufficient documentation

## 2014-09-20 DIAGNOSIS — R51 Headache: Secondary | ICD-10-CM | POA: Diagnosis not present

## 2014-09-20 DIAGNOSIS — E876 Hypokalemia: Secondary | ICD-10-CM | POA: Diagnosis not present

## 2014-09-20 DIAGNOSIS — M810 Age-related osteoporosis without current pathological fracture: Secondary | ICD-10-CM | POA: Insufficient documentation

## 2014-09-20 DIAGNOSIS — R531 Weakness: Secondary | ICD-10-CM | POA: Insufficient documentation

## 2014-09-20 DIAGNOSIS — E782 Mixed hyperlipidemia: Secondary | ICD-10-CM | POA: Diagnosis present

## 2014-09-20 DIAGNOSIS — Z7982 Long term (current) use of aspirin: Secondary | ICD-10-CM | POA: Diagnosis not present

## 2014-09-20 DIAGNOSIS — E785 Hyperlipidemia, unspecified: Secondary | ICD-10-CM | POA: Diagnosis not present

## 2014-09-20 DIAGNOSIS — Z79899 Other long term (current) drug therapy: Secondary | ICD-10-CM | POA: Diagnosis not present

## 2014-09-20 DIAGNOSIS — R0789 Other chest pain: Secondary | ICD-10-CM

## 2014-09-20 DIAGNOSIS — Z87448 Personal history of other diseases of urinary system: Secondary | ICD-10-CM | POA: Diagnosis not present

## 2014-09-20 DIAGNOSIS — K219 Gastro-esophageal reflux disease without esophagitis: Secondary | ICD-10-CM | POA: Diagnosis present

## 2014-09-20 DIAGNOSIS — Z951 Presence of aortocoronary bypass graft: Secondary | ICD-10-CM

## 2014-09-20 DIAGNOSIS — M1990 Unspecified osteoarthritis, unspecified site: Secondary | ICD-10-CM | POA: Insufficient documentation

## 2014-09-20 DIAGNOSIS — I2510 Atherosclerotic heart disease of native coronary artery without angina pectoris: Secondary | ICD-10-CM | POA: Insufficient documentation

## 2014-09-20 DIAGNOSIS — E039 Hypothyroidism, unspecified: Secondary | ICD-10-CM | POA: Diagnosis not present

## 2014-09-20 DIAGNOSIS — J441 Chronic obstructive pulmonary disease with (acute) exacerbation: Secondary | ICD-10-CM | POA: Insufficient documentation

## 2014-09-20 DIAGNOSIS — Z7951 Long term (current) use of inhaled steroids: Secondary | ICD-10-CM | POA: Insufficient documentation

## 2014-09-20 DIAGNOSIS — I10 Essential (primary) hypertension: Secondary | ICD-10-CM | POA: Insufficient documentation

## 2014-09-20 DIAGNOSIS — R0600 Dyspnea, unspecified: Secondary | ICD-10-CM

## 2014-09-20 DIAGNOSIS — F419 Anxiety disorder, unspecified: Secondary | ICD-10-CM | POA: Diagnosis not present

## 2014-09-20 DIAGNOSIS — R079 Chest pain, unspecified: Secondary | ICD-10-CM | POA: Diagnosis not present

## 2014-09-20 DIAGNOSIS — I071 Rheumatic tricuspid insufficiency: Secondary | ICD-10-CM | POA: Diagnosis present

## 2014-09-20 LAB — CBC WITH DIFFERENTIAL/PLATELET
Basophils Absolute: 0.1 10*3/uL (ref 0.0–0.1)
Basophils Relative: 2 % — ABNORMAL HIGH (ref 0–1)
Eosinophils Absolute: 0.4 10*3/uL (ref 0.0–0.7)
Eosinophils Relative: 6 % — ABNORMAL HIGH (ref 0–5)
HCT: 40 % (ref 36.0–46.0)
Hemoglobin: 13.4 g/dL (ref 12.0–15.0)
Lymphocytes Relative: 41 % (ref 12–46)
Lymphs Abs: 2.9 10*3/uL (ref 0.7–4.0)
MCH: 28.3 pg (ref 26.0–34.0)
MCHC: 33.5 g/dL (ref 30.0–36.0)
MCV: 84.4 fL (ref 78.0–100.0)
Monocytes Absolute: 0.8 10*3/uL (ref 0.1–1.0)
Monocytes Relative: 11 % (ref 3–12)
Neutro Abs: 2.8 10*3/uL (ref 1.7–7.7)
Neutrophils Relative %: 40 % — ABNORMAL LOW (ref 43–77)
Platelets: 383 10*3/uL (ref 150–400)
RBC: 4.74 MIL/uL (ref 3.87–5.11)
RDW: 13 % (ref 11.5–15.5)
WBC: 6.9 10*3/uL (ref 4.0–10.5)

## 2014-09-20 LAB — TSH: TSH: 2.428 u[IU]/mL (ref 0.350–4.500)

## 2014-09-20 LAB — BASIC METABOLIC PANEL
Anion gap: 10 (ref 5–15)
Anion gap: 10 (ref 5–15)
BUN: 25 mg/dL — ABNORMAL HIGH (ref 6–20)
BUN: 22 mg/dL — ABNORMAL HIGH (ref 6–20)
CO2: 35 mmol/L — ABNORMAL HIGH (ref 22–32)
CO2: 34 mmol/L — ABNORMAL HIGH (ref 22–32)
Calcium: 9.5 mg/dL (ref 8.9–10.3)
Calcium: 9.3 mg/dL (ref 8.9–10.3)
Chloride: 92 mmol/L — ABNORMAL LOW (ref 101–111)
Chloride: 93 mmol/L — ABNORMAL LOW (ref 101–111)
Creatinine, Ser: 1.24 mg/dL — ABNORMAL HIGH (ref 0.44–1.00)
Creatinine, Ser: 1.11 mg/dL — ABNORMAL HIGH (ref 0.44–1.00)
GFR calc Af Amer: 49 mL/min — ABNORMAL LOW (ref >60)
GFR calc Af Amer: 56 mL/min — ABNORMAL LOW (ref >60)
GFR calc non Af Amer: 42 mL/min — ABNORMAL LOW (ref >60)
GFR calc non Af Amer: 48 mL/min — ABNORMAL LOW (ref >60)
Glucose, Bld: 132 mg/dL — ABNORMAL HIGH (ref 70–99)
Glucose, Bld: 129 mg/dL — ABNORMAL HIGH (ref 70–99)
Potassium: 2.4 mmol/L — CL (ref 3.5–5.1)
Potassium: 2.5 mmol/L — CL (ref 3.5–5.1)
Sodium: 137 mmol/L (ref 135–145)
Sodium: 137 mmol/L (ref 135–145)

## 2014-09-20 LAB — URINALYSIS, ROUTINE W REFLEX MICROSCOPIC
Bilirubin Urine: NEGATIVE
Glucose, UA: NEGATIVE mg/dL
Hgb urine dipstick: NEGATIVE
Ketones, ur: NEGATIVE mg/dL
Nitrite: NEGATIVE
Protein, ur: NEGATIVE mg/dL
Specific Gravity, Urine: 1.01 (ref 1.005–1.030)
Urobilinogen, UA: 0.2 mg/dL (ref 0.0–1.0)
pH: 5.5 (ref 5.0–8.0)

## 2014-09-20 LAB — URINE MICROSCOPIC-ADD ON

## 2014-09-20 LAB — TROPONIN I
Troponin I: <0.03 ng/mL (ref <0.031)
Troponin I: <0.03 ng/mL (ref <0.031)
Troponin I: <0.03 ng/mL (ref <0.031)

## 2014-09-20 LAB — MAGNESIUM: Magnesium: 2 mg/dL (ref 1.7–2.4)

## 2014-09-20 MED ORDER — POTASSIUM CHLORIDE CRYS ER 20 MEQ PO TBCR
40.0000 meq | EXTENDED_RELEASE_TABLET | Freq: Every day | ORAL | Status: DC
  Administered 2014-09-21: 40 meq via ORAL
  Filled 2014-09-20: qty 2

## 2014-09-20 MED ORDER — AMLODIPINE BESYLATE 5 MG PO TABS
10.0000 mg | ORAL_TABLET | Freq: Every day | ORAL | Status: DC
  Administered 2014-09-21: 10 mg via ORAL
  Filled 2014-09-20: qty 2

## 2014-09-20 MED ORDER — POTASSIUM CHLORIDE 10 MEQ/100ML IV SOLN
10.0000 meq | INTRAVENOUS | Status: AC
  Administered 2014-09-20 – 2014-09-21 (×4): 10 meq via INTRAVENOUS
  Filled 2014-09-20 (×3): qty 100

## 2014-09-20 MED ORDER — TIOTROPIUM BROMIDE MONOHYDRATE 18 MCG IN CAPS
18.0000 ug | ORAL_CAPSULE | Freq: Every day | RESPIRATORY_TRACT | Status: DC
  Administered 2014-09-21: 18 ug via RESPIRATORY_TRACT
  Filled 2014-09-20: qty 5

## 2014-09-20 MED ORDER — ASPIRIN 81 MG PO CHEW: 324.0000 mg | CHEWABLE_TABLET | Freq: Once | ORAL | Status: DC

## 2014-09-20 MED ORDER — ALPRAZOLAM 0.25 MG PO TABS
0.2500 mg | ORAL_TABLET | Freq: Two times a day (BID) | ORAL | Status: DC | PRN
  Administered 2014-09-20: 0.25 mg via ORAL
  Filled 2014-09-20: qty 1

## 2014-09-20 MED ORDER — MONTELUKAST SODIUM 10 MG PO TABS
10.0000 mg | ORAL_TABLET | Freq: Every day | ORAL | Status: DC
  Administered 2014-09-20: 10 mg via ORAL
  Filled 2014-09-20: qty 1

## 2014-09-20 MED ORDER — POTASSIUM CHLORIDE 10 MEQ/100ML IV SOLN
10.0000 meq | Freq: Once | INTRAVENOUS | Status: AC
  Administered 2014-09-20: 10 meq via INTRAVENOUS
  Filled 2014-09-20: qty 100

## 2014-09-20 MED ORDER — MOMETASONE FURO-FORMOTEROL FUM 100-5 MCG/ACT IN AERO
2.0000 | INHALATION_SPRAY | Freq: Two times a day (BID) | RESPIRATORY_TRACT | Status: DC
  Administered 2014-09-21: 2 via RESPIRATORY_TRACT
  Filled 2014-09-20: qty 8.8

## 2014-09-20 MED ORDER — ATORVASTATIN CALCIUM 40 MG PO TABS
80.0000 mg | ORAL_TABLET | Freq: Every day | ORAL | Status: DC
  Administered 2014-09-20: 80 mg via ORAL
  Filled 2014-09-20: qty 2

## 2014-09-20 MED ORDER — POTASSIUM CHLORIDE CRYS ER 20 MEQ PO TBCR
60.0000 meq | EXTENDED_RELEASE_TABLET | Freq: Once | ORAL | Status: AC
  Administered 2014-09-20: 60 meq via ORAL
  Filled 2014-09-20: qty 3

## 2014-09-20 MED ORDER — SODIUM CHLORIDE 0.9 % IV BOLUS (SEPSIS)
500.0000 mL | Freq: Once | INTRAVENOUS | Status: AC
  Administered 2014-09-20: 500 mL via INTRAVENOUS

## 2014-09-20 MED ORDER — PANTOPRAZOLE SODIUM 40 MG PO TBEC
40.0000 mg | DELAYED_RELEASE_TABLET | Freq: Every day | ORAL | Status: DC
  Administered 2014-09-21: 40 mg via ORAL
  Filled 2014-09-20: qty 1

## 2014-09-20 MED ORDER — ASPIRIN 81 MG PO CHEW
81.0000 mg | CHEWABLE_TABLET | Freq: Every day | ORAL | Status: DC
  Administered 2014-09-21: 81 mg via ORAL
  Filled 2014-09-20: qty 1

## 2014-09-20 MED ORDER — LEVOTHYROXINE SODIUM 88 MCG PO TABS: 88.0000 ug | ORAL_TABLET | Freq: Every evening | ORAL | Status: DC

## 2014-09-20 MED ORDER — HEPARIN SODIUM (PORCINE) 5000 UNIT/ML IJ SOLN
5000.0000 [IU] | Freq: Three times a day (TID) | INTRAMUSCULAR | Status: DC
  Administered 2014-09-20 – 2014-09-21 (×2): 5000 [IU] via SUBCUTANEOUS
  Filled 2014-09-20: qty 1

## 2014-09-20 MED ORDER — FLUTICASONE PROPIONATE 50 MCG/ACT NA SUSP
2.0000 | Freq: Every day | NASAL | Status: DC
  Administered 2014-09-21: 2 via NASAL
  Filled 2014-09-20: qty 16

## 2014-09-20 MED ORDER — IPRATROPIUM-ALBUTEROL 0.5-2.5 (3) MG/3ML IN SOLN
3.0000 mL | Freq: Once | RESPIRATORY_TRACT | Status: AC
  Administered 2014-09-20: 3 mL via RESPIRATORY_TRACT
  Filled 2014-09-20: qty 3

## 2014-09-20 MED ORDER — FUROSEMIDE 40 MG PO TABS
40.0000 mg | ORAL_TABLET | Freq: Every morning | ORAL | Status: DC
  Administered 2014-09-21: 40 mg via ORAL
  Filled 2014-09-20: qty 1

## 2014-09-20 NOTE — ED Notes (Signed)
Attempted to call report. RN will call back. 

## 2014-09-20 NOTE — ED Notes (Signed)
Pt states her heart was beating fast this morning but she kept pushing herself. States she is having chest pain in the center of her chest and she has a headache. Pt was given NTG x2 prior to arrival. Rates her pain 2/10 now
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
## 2014-09-20 NOTE — ED Notes (Signed)
Notified by lab that patient's K+ 2.5

## 2014-09-20 NOTE — H&P (Signed)
[facility] History and Physical  DANELI BUTKIEWICZ ZOX:[id number] DOB: Oct 07, 1940    PCP:   Ernestine Conrad, MD   Chief Complaint: palpitation and chest pain.  HPI: Debbie [name] is an 74 y.o. female with hx of anxiety, GERD, known CAD, hypothyroidism, with hx of intermittent CP and palpitation, recently saw her cardiologist, had negative stress test 07/2014, with no reversible ischemia, planned to have holter monitor done outpatient, presented to the ER with 2 episode of transcient chest pain, no SOB, and mild palpitation.  She was found to have negative troponin, pain free, and EKG with no acute ST T changes, with U waves.  Her K was 2.4, with normal Magnesium.  She was given one run of IV KCL, and 60 mEq orally, and repeated K 3 hours afterward still showed 2.5 mEq per L.  She had no diarrhea, nausea, vomitiing, but has been on albuterol inhaler, and diuretics.  She takes 2 tablets of Kcl supplement but only 2x per week.  She does have some weakness, and muscle aches, but not severe.   Rewiew of Systems:  Constitutional: Negative for malaise, fever and chills. No significant weight loss or weight gain Eyes: Negative for eye pain, redness and discharge, diplopia, visual changes, or flashes of light. ENMT: Negative for ear pain, hoarseness, nasal congestion, sinus pressure and sore throat. No headaches; tinnitus, drooling, or problem swallowing. Cardiovascular: Negative for chest pain, palpitations, diaphoresis, dyspnea and peripheral edema. ; No orthopnea, PND Respiratory: Negative for cough, hemoptysis, wheezing and stridor. No pleuritic chestpain. Gastrointestinal: Negative for nausea, vomiting, diarrhea, constipation, abdominal pain, melena, blood in stool, hematemesis, jaundice and rectal bleeding.    Genitourinary: Negative for frequency, dysuria, incontinence,flank pain and hematuria; Musculoskeletal: Negative for back pain and neck pain. Negative for swelling and trauma.;   Skin: . Negative for pruritus, rash, abrasions, bruising and skin lesion.; ulcerations Neuro: Negative for headache, lightheadedness and neck stiffness. Negative for weakness, altered level of consciousness , altered mental status, extremity weakness, burning feet, involuntary movement, seizure and syncope.  Psych: negative for anxiety, depression, insomnia, tearfulness, panic attacks, hallucinations, paranoia, suicidal or homicidal ideation    Past Medical History  Diagnosis Date  . COPD (chronic obstructive pulmonary disease)   . Essential hypertension   . Hypothyroidism   . GERD (gastroesophageal reflux disease)   . Allergic rhinitis   . Anxiety   . Hyperlipidemia   . CAD (coronary artery disease)     CABG 2006 [facility]  . Carotid artery disease   . Arthritis   . Osteoporosis   . Bladder spasms   . Cystocele   . Vaginal vault prolapse   . Hypothyroidism   . Renal insufficiency   . Collagen vascular disease   . Asthma     Past Surgical History  Procedure Laterality Date  . Cornea laceration repair    . Colonoscopy    . Hernia repair    . Cataracts      REMOVED  . Coronary artery bypass graft      X 5 VESSELS  . Tubal ligation    . Cystoscopy N/A 04/06/2013    Procedure: CYSTOSCOPY;  Surgeon: Martina Sinner, MD;  Location: [facility];  Service: Urology;  Laterality: N/A;  . Anterior and posterior repair N/A 04/06/2013    Procedure: CYSTOCELE REPAIR ;  Surgeon: Martina Sinner, MD;  Location: [facility];  Service: Urology;  Laterality: N/A;    Medications:  HOME MEDS: Prior to Admission medications  Medication Sig Start Date End Date Taking? Authorizing Provider  albuterol (PROVENTIL HFA;VENTOLIN HFA) 108 (90 BASE) MCG/ACT inhaler Inhale 2 puffs into the lungs every 6 (six) hours as needed for wheezing.   Yes Historical Provider, MD  ALPRAZolam (XANAX) 0.25 MG tablet Take 0.25 mg by mouth 2 (two) times daily as needed for anxiety.   Yes Historical Provider,  MD  amLODipine (NORVASC) 10 MG tablet Take 10 mg by mouth daily.    Yes Historical Provider, MD  aspirin 81 MG tablet Take 81 mg by mouth daily.   Yes Historical Provider, MD  atorvastatin (LIPITOR) 80 MG tablet Take 80 mg by mouth at bedtime.   Yes Historical Provider, MD  cetirizine (ZYRTEC) 10 MG tablet Take 10 mg by mouth daily.   Yes Historical Provider, MD  esomeprazole (NEXIUM) 20 MG capsule Take 20 mg by mouth daily at 12 noon.   Yes Historical Provider, MD  fluticasone (FLONASE) 50 MCG/ACT nasal spray Place 2 sprays into the nose daily.   Yes Historical Provider, MD  Fluticasone-Salmeterol (ADVAIR) 250-50 MCG/DOSE AEPB Inhale 1 puff into the lungs every 12 (twelve) hours.   Yes Historical Provider, MD  furosemide (LASIX) 40 MG tablet Take 40 mg by mouth every morning.    Yes Historical Provider, MD  guaiFENesin (MUCINEX) 600 MG 12 hr tablet Take 1,200 mg by mouth 2 (two) times daily as needed for congestion.   Yes Historical Provider, MD  hydrochlorothiazide (HYDRODIURIL) 25 MG tablet Take 1 tablet by mouth daily. 07/01/14  Yes Historical Provider, MD  levothyroxine (SYNTHROID, LEVOTHROID) 88 MCG tablet Take 88 mcg by mouth every evening.   Yes Historical Provider, MD  montelukast (SINGULAIR) 10 MG tablet Take 10 mg by mouth at bedtime.   Yes Historical Provider, MD  potassium chloride SA (K-DUR,KLOR-CON) 20 MEQ tablet Take 20 mEq by mouth 2 (two) times a week.    Yes Historical Provider, MD  tiotropium (SPIRIVA) 18 MCG inhalation capsule Place 18 mcg into inhaler and inhale daily.   Yes Historical Provider, MD     Allergies:  Allergies  Allergen Reactions  . Ivp Dye [Iodinated Diagnostic Agents] Swelling    Kidney Dye  . Betadine [Povidone Iodine] Rash  . Codeine Nausea And Vomiting  . Other Other (See Comments)    ALL NARCOTICS  . Penicillins Rash  . Sulfa Antibiotics Rash    Social History:   reports that she has never smoked. She has never used smokeless tobacco. She  reports that she drinks alcohol. She reports that she does not use illicit drugs.  Family History: Family History  Problem Relation Age of Onset  . Cirrhosis Father   . Lung disease Father   . Diabetes Mellitus II Mother   . Heart Problems Mother      Physical Exam: Filed Vitals:   09/20/14 1715 09/20/14 1730 09/20/14 1800 09/20/14 1954  BP:  109/68 112/73 136/67  Pulse:   77 72  Temp:      TempSrc:      Resp:  16 21 20   SpO2: 96%  92% 95%   Blood pressure 136/67, pulse 72, temperature 98.4 F (36.9 C), temperature source Oral, resp. rate 20, SpO2 95 %.  GEN:  Pleasant  patient lying in the stretcher in no acute distress; cooperative with exam. PSYCH:  alert and oriented x4; does not appear anxious or depressed; affect is appropriate. HEENT: Mucous membranes pink and anicteric; PERRLA; EOM intact; no cervical lymphadenopathy nor thyromegaly or carotid bruit;  no JVD; There were no stridor. Neck is very supple. Breasts:: Not examined CHEST WALL: No tenderness CHEST: Normal respiration, clear to auscultation bilaterally.  HEART: Regular rate and rhythm.  There are no murmur, rub, or gallops.   BACK: No kyphosis or scoliosis; no CVA tenderness ABDOMEN: soft and non-tender; no masses, no organomegaly, normal abdominal bowel sounds; no pannus; no intertriginous candida. There is no rebound and no distention. Rectal Exam: Not done EXTREMITIES: No bone or joint deformity; age-appropriate arthropathy of the hands and knees; no edema; no ulcerations.  There is no calf tenderness. Genitalia: not examined PULSES: 2+ and symmetric SKIN: Normal hydration no rash or ulceration CNS: Cranial nerves 2-12 grossly intact no focal lateralizing neurologic deficit.  Speech is fluent; uvula elevated with phonation, facial symmetry and tongue midline. DTR are normal bilaterally, cerebella exam is intact, barbinski is negative and strengths are equaled bilaterally.  No sensory loss.   Labs on  Admission:  Basic Metabolic Panel:  Recent Labs Lab 09/20/14 1633 09/20/14 1920  NA 137 137  K 2.4* 2.5*  CL 92* 93*  CO2 35* 34*  GLUCOSE 132* 129*  BUN 25* 22*  CREATININE 1.24* 1.11*  CALCIUM 9.5 9.3  MG  --  2.0   CBC:  Recent Labs Lab 09/20/14 1633  WBC 6.9  NEUTROABS 2.8  HGB 13.4  HCT 40.0  MCV 84.4  PLT 383   Cardiac Enzymes:  Recent Labs Lab 09/20/14 1633 09/20/14 1920  TROPONINI <0.03 <0.03    CBG: No results for input(s): GLUCAP in the last 168 hours.   Radiological Exams on Admission: Dg Chest 2 View  09/20/2014   CLINICAL DATA:  One day history of chest tightness  EXAM: CHEST  2 VIEW  COMPARISON:  Chest radiograph July 29, 2014 and chest CT July 30, 2014  FINDINGS: There is underlying emphysematous change. There is no edema or consolidation. The heart size is normal. The pulmonary vascularity reflects underlying emphysema. Patient is status post coronary artery bypass grafting. No adenopathy. There is degenerative change in the thoracic spine.  IMPRESSION: Underlying emphysema.  No edema or consolidation.   Electronically Signed   By: Bretta Bang III M.D.   On: 09/20/2014 17:19    EKG: Independently reviewed.    Assessment/Plan Present on Admission:  . Hypokalemia . Atypical chest pain . Hyperlipidemia . GERD (gastroesophageal reflux disease) . COPD (chronic obstructive pulmonary disease) . Tricuspid regurgitation . Anxiety   PLAN:  Will admit her for hypokalemia.  I suspect her total body K deficit is several hundred mEq's. It is likely from diuretics and albuterol use.   Will give 4 runs of IV and follow her K.  She will need on going oral supplement more than what she is getting at this time.   For her chest pain, will cycle more troponins, but I don't think this is ACS.  She did have a negative stress test last month.  She is stable, full code, and will be admitted to telemetry under [facility].  Thank you for allowing me to participate in  her care.   Other plans as per orders.  Code Status: FULL Unk Lightning, MD. [facility] Pager [phone] 7pm to 7am.  09/20/2014, 9:11 PM

## 2014-09-20 NOTE — ED Notes (Signed)
CRITICAL VALUE ALERT  Critical value received: K+ 2.4 Date of notification:  09/20/14  Time of notification:  1725  Critical value read back:Yes.    Nurse who received alert:  LRT  MD notified (1st page): Zavitz Time of first page: 1726  MD notified (2nd page):  Time of second page:  Responding MD: Jodi MourningZavitz  Time MD responded:  [phone]

## 2014-09-20 NOTE — Discharge Instructions (Signed)
Call heart doctor tomorrow.  If you were given medicines take as directed.  If you are on coumadin or contraceptives realize their levels and effectiveness is altered by many different medicines.  If you have any reaction (rash, tongues swelling, other) to the medicines stop taking and see a physician.   Please follow up as directed and return to the ER or see a physician for new or worsening symptoms.  Thank you. Filed Vitals:   09/20/14 1715 09/20/14 1730 09/20/14 1800 09/20/14 1954  BP:  109/68 112/73 136/67  Pulse:   77 72  Temp:      TempSrc:      Resp:  SpO2: 96%  92% 95%    Chest Pain (Nonspecific) It is often hard to give a specific diagnosis for the cause of chest pain. There is always a chance that your pain could be related to something serious, such as a heart attack or a blood clot in the lungs. You need to follow up with your health care provider for further evaluation. CAUSES   Heartburn.  Pneumonia or bronchitis.  Anxiety or stress.  Inflammation around your heart (pericarditis) or lung (pleuritis or pleurisy).  A blood clot in the lung.  A collapsed lung (pneumothorax). It can develop suddenly on its own (spontaneous pneumothorax) or from trauma to the chest.  Shingles infection (herpes zoster virus). The chest wall is composed of bones, muscles, and cartilage. Any of these can be the source of the pain.  The bones can be bruised by injury.  The muscles or cartilage can be strained by coughing or overwork.  The cartilage can be affected by inflammation and become sore (costochondritis). DIAGNOSIS  Lab tests or other studies may be needed to find the cause of your pain. Your health care provider may have you take a test called an ambulatory electrocardiogram (ECG). An ECG records your heartbeat patterns over a 24-hour period. You may also have other tests, such as:  Transthoracic echocardiogram (TTE). During echocardiography, sound waves are used to  evaluate how blood flows through your heart.  Transesophageal echocardiogram (TEE).  Cardiac monitoring. This allows your health care provider to monitor your heart rate and rhythm in real time.  Holter monitor. This is a portable device that records your heartbeat and can help diagnose heart arrhythmias. It allows your health care provider to track your heart activity for several days, if needed.  Stress tests by exercise or by giving medicine that makes the heart beat faster. TREATMENT   Treatment depends on what may be causing your chest pain. Treatment may include:  Acid blockers for heartburn.  Anti-inflammatory medicine.  Pain medicine for inflammatory conditions.  Antibiotics if an infection is present.  You may be advised to change lifestyle habits. This includes stopping smoking and avoiding alcohol, caffeine, and chocolate.  You may be advised to keep your head raised (elevated) when sleeping. This reduces the chance of acid going backward from your stomach into your esophagus. Most of the time, nonspecific chest pain will improve within 2-3 days with rest and mild pain medicine.  HOME CARE INSTRUCTIONS   If antibiotics were prescribed, take them as directed. Finish them even if you start to feel better.  For the next few days, avoid physical activities that bring on chest pain. Continue physical activities as directed.  Do not use any tobacco products, including cigarettes, chewing tobacco, or electronic cigarettes.  Avoid drinking alcohol.  Only take medicine as directed by  your health care provider.  Follow your health care provider's suggestions for further testing if your chest pain does not go away.  Keep any follow-up appointments you made. If you do not go to an appointment, you could develop lasting (chronic) problems with pain. If there is any problem keeping an appointment, call to reschedule. SEEK MEDICAL CARE IF:   Your chest pain does not go away,  even after treatment.  You have a rash with blisters on your chest.  You have a fever. SEEK IMMEDIATE MEDICAL CARE IF:   You have increased chest pain or pain that spreads to your arm, neck, jaw, back, or abdomen.  You have shortness of breath.  You have an increasing cough, or you cough up blood.  You have severe back or abdominal pain.  You feel nauseous or vomit.  You have severe weakness.  You faint.  You have chills. This is an emergency. Do not wait to see if the pain will go away. Get medical help at once. Call your local emergency services (911 in [place] Do not drive yourself to the hospital. MAKE SURE YOU:   Understand these instructions.  Will watch your condition.  Will get help right away if you are not doing well or get worse. Document Released: 02/13/2005 Document Revised: 05/11/2013 Document Reviewed: 12/10/2007 [facility] Patient Information 2015 [employer] [place]. This information is not intended to replace advice given to you by your health care provider. Make sure you discuss any questions you have with your health care provider.

## 2014-09-21 DIAGNOSIS — R0789 Other chest pain: Secondary | ICD-10-CM | POA: Diagnosis not present

## 2014-09-21 DIAGNOSIS — E876 Hypokalemia: Secondary | ICD-10-CM | POA: Diagnosis not present

## 2014-09-21 DIAGNOSIS — R079 Chest pain, unspecified: Secondary | ICD-10-CM | POA: Diagnosis not present

## 2014-09-21 LAB — BASIC METABOLIC PANEL
Anion gap: 10 (ref 5–15)
BUN: 17 mg/dL (ref 6–20)
CO2: 29 mmol/L (ref 22–32)
Calcium: 9.3 mg/dL (ref 8.9–10.3)
Chloride: 101 mmol/L (ref 101–111)
Creatinine, Ser: 0.92 mg/dL (ref 0.44–1.00)
GFR calc Af Amer: >60 mL/min (ref >60)
GFR calc non Af Amer: >60 mL/min (ref >60)
Glucose, Bld: 101 mg/dL — ABNORMAL HIGH (ref 70–99)
Potassium: 3.3 mmol/L — ABNORMAL LOW (ref 3.5–5.1)
Sodium: 140 mmol/L (ref 135–145)

## 2014-09-21 LAB — TROPONIN I: Troponin I: <0.03 ng/mL (ref <0.031)

## 2014-09-21 MED ORDER — POTASSIUM CHLORIDE CRYS ER 20 MEQ PO TBCR: 20.0000 meq | EXTENDED_RELEASE_TABLET | Freq: Every day | ORAL | Status: DC

## 2014-09-21 NOTE — Progress Notes (Signed)
UR completed 

## 2014-09-21 NOTE — Discharge Summary (Signed)
Physician Discharge Summary  Debbie [name] WRU:[id number] DOB: 1941-05-11 DOA: 09/20/2014  PCP: Ernestine Conrad, MD  Admit date: 09/20/2014 Discharge date: 09/21/2014  Recommendations for Outpatient Follow-up:  Follow-up with primary care doctor in 1 week to repeat BMP to check potassium, creatinine Cardiology as soon as possible for possible outpatient telemetry or holtor  Discharge Diagnoses:  Principal Problem:   Hypokalemia Active Problems:   COPD (chronic obstructive pulmonary disease)   GERD (gastroesophageal reflux disease)   Anxiety   Hyperlipidemia   Hx of CABG   Tricuspid regurgitation   Atypical chest pain   Discharge Condition: Stable, improved  Diet recommendation: Low-sodium, healthy heart  Wt Readings from Last 3 Encounters:  09/20/14 62.959 kg (138 lb 12.8 oz)  09/15/14 62.506 kg (137 lb 12.8 oz)  08/01/14 64.411 kg (142 lb)    History of present illness:  Debbie [name] is a 74 y.o. female with hx of anxiety, GERD, known CAD, hypothyroidism, with hx of intermittent CP and palpitations who recently saw her cardiologist and had negative stress test 07/2014.  She had planned to have holter monitor done outpatient.   She presented to the ER with 2 episode of transient chest pain, no SOB, and mild palpitations. She was found to have negative troponin, pain free, and EKG with no acute ST T changes, with U waves. Her K was 2.4 with normal Magnesium. Her CO2, BUN, and creatinine were all mildly elevated suggesting mild dehydration. She was given IV fluids and potassium repletion with improvement in her potassium levels and kidney function.  Hospital Course:   Hypokalemia, may be responsible for her palpitations. After repletion, she felt markedly improved. -  Telemetry: Normal sinus rhythm -  Troponins: Negative 3 -  EKG, no ischemic changes -  Stopped HCTZ but continued lasix -  Started potassium daily -  PCP to please repeat BMP in 1 week and check  blood pressure -  Continue daily weights, and if she starts to gain weight, consider addition of spironolactone instead of HCTZ  Palpitations, being worked up by cardiology. Her hypokalemia may be contributing. Recommend she follow-up with cardiology for possible Holter monitor. She did not have any palpitations nor arrhythmias during hospitalization.  Rest of her medical problems remain stable, and she resumed her home medications at discharge.  Procedures:  Chest x-ray  Consultations:  None  Discharge Exam: Filed Vitals:   09/21/14 0405  BP: 127/82  Pulse: 77  Temp: 98.4 F (36.9 C)  Resp: 20   Filed Vitals:   09/20/14 1954 09/20/14 2000 09/20/14 2247 09/21/14 0405  BP: 136/67 117/69 104/66 127/82  Pulse: 72 66 58 77  Temp:   98 F (36.7 C) 98.4 F (36.9 C)  TempSrc:   Oral Oral  Resp: Height:   5' 3.5" (1.613 m)   Weight:   62.959 kg (138 lb 12.8 oz)   SpO2: 95% 97% 95% 98%    General: Average weight female, no acute distress Cardiovascular: Regular rate and rhythm, no murmurs, rubs, or gallops, 2+ pulses Respiratory: Clear to auscultation bilaterally Abdomen: NABS, soft, nondistended, nontender MSK: Normal tone and bulk, no lower extremity edema  Discharge Instructions      Discharge Instructions    (HEART FAILURE PATIENTS) Call MD:  Anytime you have any of the following symptoms: 1) 3 pound weight gain in 24 hours or 5 pounds in 1 week 2) shortness of breath, with or without a dry hacking cough 3)  swelling in the hands, feet or stomach 4) if you have to sleep on extra pillows at night in order to breathe.    Complete by:  As directed      Call MD for:  difficulty breathing, headache or visual disturbances    Complete by:  As directed      Call MD for:  extreme fatigue    Complete by:  As directed      Call MD for:  hives    Complete by:  As directed      Call MD for:  persistant dizziness or light-headedness    Complete by:  As directed       Call MD for:  persistant nausea and vomiting    Complete by:  As directed      Call MD for:  severe uncontrolled pain    Complete by:  As directed      Call MD for:  temperature >100.4    Complete by:  As directed      Diet - low sodium heart healthy    Complete by:  As directed      Discharge instructions    Complete by:  As directed   Please stop your HCTZ as this medication can lower potassium levels. Start taking potassium 20 mg every day and follow-up with her primary care doctor in approximately 1 week to have your blood work rechecked. Please weigh yourself every day and if you gain more than 3 pounds in 1 day or 5 pounds from the time of discharge, please contact your cardiologist right away. You may need an additional medication to help get rid of extra fluid that does not lower your potassium levels.  Talk to your doctor about your esomeprazole as this medication can make it hard to absorb potassium.  Please continue to follow-up with cardiology regarding your palpitations.     Increase activity slowly    Complete by:  As directed             Medication List    STOP taking these medications        hydrochlorothiazide 25 MG tablet  Commonly known as:  HYDRODIURIL      TAKE these medications        albuterol 108 (90 BASE) MCG/ACT inhaler  Commonly known as:  PROVENTIL HFA;VENTOLIN HFA  Inhale 2 puffs into the lungs every 6 (six) hours as needed for wheezing.     ALPRAZolam 0.25 MG tablet  Commonly known as:  XANAX  Take 0.25 mg by mouth 2 (two) times daily as needed for anxiety.     amLODipine 10 MG tablet  Commonly known as:  NORVASC  Take 10 mg by mouth daily.     aspirin 81 MG tablet  Take 81 mg by mouth daily.     atorvastatin 80 MG tablet  Commonly known as:  LIPITOR  Take 80 mg by mouth at bedtime.     cetirizine 10 MG tablet  Commonly known as:  ZYRTEC  Take 10 mg by mouth daily.     esomeprazole 20 MG capsule  Commonly known as:  NEXIUM  Take 20  mg by mouth daily at 12 noon.     fluticasone 50 MCG/ACT nasal spray  Commonly known as:  FLONASE  Place 2 sprays into the nose daily.     Fluticasone-Salmeterol 250-50 MCG/DOSE Aepb  Commonly known as:  ADVAIR  Inhale 1 puff into the lungs every 12 (twelve) hours.  furosemide 40 MG tablet  Commonly known as:  LASIX  Take 40 mg by mouth every morning.     guaiFENesin 600 MG 12 hr tablet  Commonly known as:  MUCINEX  Take 1,200 mg by mouth 2 (two) times daily as needed for congestion.     levothyroxine 88 MCG tablet  Commonly known as:  SYNTHROID, LEVOTHROID  Take 88 mcg by mouth every evening.     montelukast 10 MG tablet  Commonly known as:  SINGULAIR  Take 10 mg by mouth at bedtime.     potassium chloride SA 20 MEQ tablet  Commonly known as:  K-DUR,KLOR-CON  Take 1 tablet (20 mEq total) by mouth daily.     tiotropium 18 MCG inhalation capsule  Commonly known as:  SPIRIVA  Place 18 mcg into inhaler and inhale daily.       Follow-up Information    Follow up with [facility]. Call in 1 day.   Contact information:   [address] [place] [phone]      Follow up with Ernestine ConradBLUTH, KIRK, MD. Schedule an appointment as soon as possible for a visit in 1 week.   Specialty:  Family Medicine   Contact information:   [address] [place] [id number] [phone]        The results of significant diagnostics from this hospitalization (including imaging, microbiology, ancillary and laboratory) are listed below for reference.    Significant Diagnostic Studies: Dg Chest 2 View  09/20/2014   CLINICAL DATA:  One day history of chest tightness  EXAM: CHEST  2 VIEW  COMPARISON:  Chest radiograph July 29, 2014 and chest CT July 30, 2014  FINDINGS: There is underlying emphysematous change. There is no edema or consolidation. The heart size is normal. The pulmonary vascularity reflects  underlying emphysema. Patient is status post coronary artery bypass grafting. No adenopathy. There is degenerative change in the thoracic spine.  IMPRESSION: Underlying emphysema.  No edema or consolidation.   Electronically Signed   By: Bretta BangWilliam  Woodruff III M.D.   On: 09/20/2014 17:19    Microbiology: No results found for this or any previous visit (from the past 240 hour(s)).   Labs: Basic Metabolic Panel:  Recent Labs Lab 09/20/14 1633 09/20/14 1920 09/21/14 0400  NA 137 137 140  K 2.4* 2.5* 3.3*  CL 92* 93* 101  CO2 35* 34* 29  GLUCOSE 132* 129* 101*  BUN 25* 22* 17  CREATININE 1.24* 1.11* 0.92  CALCIUM 9.5 9.3 9.3  MG  --  2.0  --    Liver Function Tests: No results for input(s): AST, ALT, ALKPHOS, BILITOT, PROT, ALBUMIN in the last 168 hours. No results for input(s): LIPASE, AMYLASE in the last 168 hours. No results for input(s): AMMONIA in the last 168 hours. CBC:  Recent Labs Lab 09/20/14 1633  WBC 6.9  NEUTROABS 2.8  HGB 13.4  HCT 40.0  MCV 84.4  PLT 383   Cardiac Enzymes:  Recent Labs Lab 09/20/14 1633 09/20/14 1920 09/20/14 2234 09/21/14 0400  TROPONINI <0.03 <0.03 <0.03 <0.03   BNP: BNP (last 3 results) No results for input(s): BNP in the last 8760 hours.  ProBNP (last 3 results) No results for input(s): PROBNP in the last 8760 hours.  CBG: No results for input(s): GLUCAP in the last 168 hours.  Time coordinating discharge: 35 minutes  Signed:  Evelyne Makepeace  [facility] 09/21/2014, 12:02 PM

## 2014-09-21 NOTE — Care Management Note (Signed)
Case Management Note  Patient Details  Name: Marcelino Dusterlizabeth C Mangham MRN: [id number] Date of Birth: 29-Jan-1941  Subjective/Objective:                  Pt admitted from home with hypokalemia. Pt lives alone and will return home at discharge. Pt is independent with ADL's.   Action/Plan: No CM needs noted. Pt discharged today.  Expected Discharge Date:  09/21/14               Expected Discharge Plan:  Home/Self Care  In-House Referral:  NA  Discharge planning Services  CM Consult  Post Acute Care Choice:  NA Choice offered to:  NA  DME Arranged:    DME Agency:     HH Arranged:    HH Agency:     Status of Service:     Medicare Important Message Given:  No Date Medicare IM Given:    Medicare IM give by:    Date Additional Medicare IM Given:    Additional Medicare Important Message give by:     If discussed at Long Length of Stay Meetings, dates discussed:    Additional Comments:  Cheryl FlashBlackwell, Jayquan Bradsher Crowder, RN 09/21/2014, 12:34 PM

## 2014-09-21 NOTE — Progress Notes (Signed)
Patient with orders to be discharge home. Discharge instructions given, patient verbalized understanding. Patient stable. Patient left in private vehicle with family.  

## 2014-09-22 ENCOUNTER — Ambulatory Visit: Payer: Medicare PPO | Admitting: Cardiovascular Disease

## 2014-09-22 LAB — URINE CULTURE
Colony Count: NO GROWTH
Culture: NO GROWTH

## 2014-09-27 ENCOUNTER — Encounter: Payer: Self-pay | Admitting: Cardiology

## 2014-09-27 ENCOUNTER — Encounter: Payer: Medicare HMO | Admitting: Cardiology

## 2014-09-27 NOTE — Progress Notes (Signed)
No show  This encounter was created in error - please disregard.

## 2014-09-29 ENCOUNTER — Telehealth: Payer: Self-pay | Admitting: Cardiology

## 2014-09-29 NOTE — Telephone Encounter (Signed)
Patient is complaining of cramping. She has appt with Mcdowell  5/31 but feels she needs to address her cramping before then

## 2014-09-29 NOTE — Telephone Encounter (Signed)
Noted. She actually had a visit scheduled with me on 5/10 and did not come.

## 2014-09-29 NOTE — Telephone Encounter (Signed)
Patient c/o cramping last night and wanted to see cardiologist earlier than 10/18/14. No c/o chest pain, dizziness or sob. Patient advised that Diona BrownerMcDowell didn't have any sooner appointments and that she could contact her PCP. Patient verbalized understanding of plan.

## 2014-09-30 ENCOUNTER — Encounter: Payer: Self-pay | Admitting: Cardiology

## 2014-09-30 ENCOUNTER — Ambulatory Visit (INDEPENDENT_AMBULATORY_CARE_PROVIDER_SITE_OTHER): Payer: Medicare HMO

## 2014-09-30 ENCOUNTER — Other Ambulatory Visit: Payer: Self-pay | Admitting: *Deleted

## 2014-09-30 DIAGNOSIS — R002 Palpitations: Secondary | ICD-10-CM

## 2014-10-05 ENCOUNTER — Ambulatory Visit (INDEPENDENT_AMBULATORY_CARE_PROVIDER_SITE_OTHER): Payer: Medicare HMO

## 2014-10-05 DIAGNOSIS — I739 Peripheral vascular disease, unspecified: Principal | ICD-10-CM

## 2014-10-05 DIAGNOSIS — I779 Disorder of arteries and arterioles, unspecified: Secondary | ICD-10-CM

## 2014-10-06 ENCOUNTER — Telehealth: Payer: Self-pay | Admitting: *Deleted

## 2014-10-06 NOTE — Telephone Encounter (Signed)
-----   Message from Jonelle SidleSamuel G McDowell, MD sent at 10/06/2014  7:38 AM EDT ----- Reviewed report.1-39% RICA stenosis, with has decreased from prior study. Stable 1-39% LICA stenosis. Continue medical therapy.

## 2014-10-06 NOTE — Telephone Encounter (Signed)
Patient informed. 

## 2014-10-18 ENCOUNTER — Ambulatory Visit (INDEPENDENT_AMBULATORY_CARE_PROVIDER_SITE_OTHER): Payer: Medicare HMO | Admitting: Cardiology

## 2014-10-18 ENCOUNTER — Encounter: Payer: Self-pay | Admitting: Cardiology

## 2014-10-18 VITALS — BP 97/62 | HR 59 | Ht 63.0 in | Wt 139.1 lb

## 2014-10-18 DIAGNOSIS — I25709 Atherosclerosis of coronary artery bypass graft(s), unspecified, with unspecified angina pectoris: Secondary | ICD-10-CM | POA: Diagnosis not present

## 2014-10-18 DIAGNOSIS — R0602 Shortness of breath: Secondary | ICD-10-CM | POA: Diagnosis not present

## 2014-10-18 DIAGNOSIS — I779 Disorder of arteries and arterioles, unspecified: Secondary | ICD-10-CM | POA: Diagnosis not present

## 2014-10-18 DIAGNOSIS — R002 Palpitations: Secondary | ICD-10-CM | POA: Diagnosis not present

## 2014-10-18 DIAGNOSIS — I739 Peripheral vascular disease, unspecified: Secondary | ICD-10-CM

## 2014-10-18 NOTE — Progress Notes (Signed)
Cardiology Office Note  Date: 10/18/2014   ID: Debbie [name], DOB 1940/06/11, MRN [id number]  PCP: Ernestine ConradBLUTH, KIRK, MD  Primary Cardiologist: Nona DellSamuel McDowell, MD   Chief Complaint  Patient presents with  . Follow-up testing    History of Present Illness: Debbie [name] is a 74 y.o. female seen by me for the first time in April. At that time we referred her for cardiac monitoring with complaints of intermittent chest discomfort (recent reassuring cardiac structural and ischemic workup) and palpitations. She was also referred for follow-up carotid Dopplers.  Interval records indicate [facility] with significant hypokalemia, potassium down to 2.4. Potassium supplements were advanced and she was also hydrated.  Seven-day cardiac monitor showed sinus rhythm throughout. Of note, monitoring during hospital stay also showed sinus rhythm.  Today we discussed the results of her recent testing and also her hospital stay. She states that she has followed up with Dr. Loney HeringBluth and that her potassium had come up some since hospital stay. She is now taking KCl 40 mEq total daily.  She reports fatigue, has not gotten back to her regular exercise plan yet. I encouraged her to [facility]. Otherwise no chest pain symptoms.   Past Medical History  Diagnosis Date  . COPD (chronic obstructive pulmonary disease)   . Essential hypertension   . Hypothyroidism   . GERD (gastroesophageal reflux disease)   . Allergic rhinitis   . Anxiety   . Hyperlipidemia   . CAD (coronary artery disease)     [facility] [place]  . Carotid artery disease   . Arthritis   . Osteoporosis   . Bladder spasms   . Cystocele   . Vaginal vault prolapse   . Hypothyroidism   . Renal insufficiency   . Collagen vascular disease   . Asthma     Past Surgical History  Procedure Laterality Date  . Cornea laceration repair    . Colonoscopy    . Hernia repair    . Cataracts        REMOVED  . Coronary artery bypass graft      X 5 VESSELS  . Tubal ligation    . Cystoscopy N/A 04/06/2013    Procedure: CYSTOSCOPY;  Surgeon: Martina SinnerScott A MacDiarmid, MD;  Location: [facility];  Service: Urology;  Laterality: N/A;  . Anterior and posterior repair N/A 04/06/2013    Procedure: CYSTOCELE REPAIR ;  Surgeon: Martina SinnerScott A MacDiarmid, MD;  Location: [facility];  Service: Urology;  Laterality: N/A;    Current Outpatient Prescriptions  Medication Sig Dispense Refill  . albuterol (PROVENTIL HFA;VENTOLIN HFA) 108 (90 BASE) MCG/ACT inhaler Inhale 2 puffs into the lungs every 6 (six) hours as needed for wheezing.    [name]. ALPRAZolam (XANAX) 0.25 MG tablet Take 0.25 mg by mouth 2 (two) times daily as needed for anxiety.    [name]. amLODipine (NORVASC) 10 MG tablet Take 10 mg by mouth daily.     [name]. aspirin 81 MG tablet Take 81 mg by mouth daily.    [name]. atorvastatin (LIPITOR) 80 MG tablet Take 80 mg by mouth at bedtime.    . cetirizine (ZYRTEC) 10 MG tablet Take 10 mg by mouth daily.    [name]. esomeprazole (NEXIUM) 20 MG capsule Take 20 mg by mouth daily at 12 noon.    . fluticasone (FLONASE) 50 MCG/ACT nasal spray Place 2 sprays into the nose daily.    . Fluticasone-Salmeterol (ADVAIR) 250-50 MCG/DOSE AEPB  Inhale 1 puff into the lungs every 12 (twelve) hours.    . furosemide (LASIX) 40 MG tablet Take 40 mg by mouth every morning.     [name] guaiFENesin (MUCINEX) 600 MG 12 hr tablet Take 1,200 mg by mouth 2 (two) times daily as needed for congestion.    [name] levothyroxine (SYNTHROID, LEVOTHROID) 88 MCG tablet Take 88 mcg by mouth every evening.    . montelukast (SINGULAIR) 10 MG tablet Take 10 mg by mouth at bedtime.    . potassium chloride SA (K-DUR,KLOR-CON) 20 MEQ tablet Take 1 tablet (20 mEq total) by mouth daily. (Patient taking differently: Take 40 mEq by mouth 2 (two) times daily. ) 30 tablet 0  . tiotropium (SPIRIVA) 18 MCG inhalation capsule Place 18 mcg into inhaler and inhale daily.     No current  facility-administered medications for this visit.    Allergies:  Ivp dye; Betadine; Codeine; Other; Penicillins; and Sulfa antibiotics   Social History: The patient  reports that she has never smoked. She has never used smokeless tobacco. She reports that she drinks alcohol. She reports that she does not use illicit drugs.   ROS:  Please see the history of present illness. Otherwise, complete review of systems is positive for NYHA class 2-3 dyspnea.  All other systems are reviewed and negative.   Physical Exam: VS:  BP 97/62 mmHg  Pulse 59  Ht  (1.6 m)  Wt 139 lb 1.9 oz (63.104 kg)  BMI 24.65 kg/m2  SpO2 96%, BMI Body mass index is 24.65 kg/(m^2).  Wt Readings from Last 3 Encounters:  10/18/14 139 lb 1.9 oz (63.104 kg)  09/20/14 138 lb 12.8 oz (62.959 kg)  09/15/14 137 lb 12.8 oz (62.506 kg)     General: Patient appears comfortable at rest. HEENT: Conjunctiva and lids normal, oropharynx clear. Neck: Supple, no elevated JVP, right carotid bruit, no thyromegaly. Lungs: Decreased breath sounds without wheezing, nonlabored breathing at rest. Cardiac: Regular rate and rhythm, no S3 or significant systolic murmur, no pericardial rub. Abdomen: Soft, nontender, bowel sounds present, no guarding or rebound. Extremities: No pitting edema, distal pulses 2+. Skin: Warm and dry. Musculoskeletal: No kyphosis. Neuropsychiatric: Alert and oriented x3, affect grossly appropriate.   ECG: Tracing from 09/21/2014 showed sinus rhythm with left atrial enlargement, LVH and repolarization abnormalities.   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   Assessment and Plan:  1. Palpitations, no definitive arrhythmia noted by cardiac monitoring. Would continue  observation for now. As noted previously, she had to be taken off atenolol the past related to bradycardia.  2. CAD status post CABG in 2006. Recent ischemic testing was low risk. Continue medical therapy including aspirin and Lipitor.  3. Chronic shortness of breath, multifactorial with COPD.  4. Recently documented severe hypokalemia in the setting of diuretic therapy, potassium supplements have been advanced. Keep follow-up with Dr. Loney Hering.  5. Carotid artery disease, mild by recent follow-up testing.  Current medicines were reviewed with the patient today.  Disposition: FU with me in 6 months.   Signed, Jonelle Sidle, MD, [facility] 10/18/2014 8:25 AM    [facility] [address] [place], [place], [place] [address] Phone: [phone]; Fax: [phone]

## 2014-10-18 NOTE — Patient Instructions (Signed)
Your physician recommends that you continue on your current medications as directed. Please refer to the Current Medication list given to you today. Your physician recommends that you schedule a follow-up appointment in: 6 months. You will receive a reminder letter in the mail in about 4 months reminding you to call and schedule your appointment. If you don't receive this letter, please contact our office. 

## 2015-01-24 ENCOUNTER — Ambulatory Visit (INDEPENDENT_AMBULATORY_CARE_PROVIDER_SITE_OTHER): Payer: Medicare Other | Admitting: Internal Medicine

## 2015-01-24 ENCOUNTER — Encounter: Payer: Self-pay | Admitting: Internal Medicine

## 2015-01-24 VITALS — BP 102/60 | HR 72 | Ht 63.5 in | Wt 135.6 lb

## 2015-01-24 DIAGNOSIS — R0600 Dyspnea, unspecified: Secondary | ICD-10-CM | POA: Insufficient documentation

## 2015-01-24 DIAGNOSIS — I10 Essential (primary) hypertension: Secondary | ICD-10-CM

## 2015-01-24 MED ORDER — FAMOTIDINE 20 MG PO TABS: ORAL_TABLET | ORAL | Status: DC

## 2015-01-24 MED ORDER — ESOMEPRAZOLE MAGNESIUM 20 MG PO CPDR: DELAYED_RELEASE_CAPSULE | ORAL | Status: DC

## 2015-01-24 MED ORDER — MOMETASONE FURO-FORMOTEROL FUM 100-5 MCG/ACT IN AERO: INHALATION_SPRAY | RESPIRATORY_TRACT | Status: DC

## 2015-01-24 NOTE — Progress Notes (Signed)
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 Pulmonary Consult    Referred by Dr. Ernestine Conrad. Pt c/o dyspnea "for years". She states that she gets SOB with or without exertion. She also c/o cough at night-  non prod.  She uses o2 with sleep at 2lpm.   3-4 am's per week difficulty breathing at rest so uses proventil with some benefit  And noct lots of cough but dry   Prednisone makes it better but nothing really corrects it completely/ discouraged she's been told it's copd but never had pfts. As of 12/20/14 was on ACEi but did not disclose this at time of ov. Has been on advair not helping  No obvious patterns in day to day or daytime variabilty or assoc   cp or chest tightness, subjective wheeze overt sinus or hb symptoms. No unusual exp hx or h/o childhood pna/ asthma    Sleeping ok without nocturnal  or early am exacerbation  of respiratory  c/o's or need for noct saba. Also denies any obvious fluctuation of symptoms with weather or environmental changes or other aggravating or alleviating factors except as outlined above   Current Medications, Allergies, Complete Past Medical History, Past Surgical History, Family History, and Social History were reviewed in [employer] record.           Review of Systems  Constitutional: Negative for fever, chills and unexpected weight change.  HENT: Positive for postnasal drip, sinus pressure and trouble swallowing. Negative for congestion, dental problem, ear pain, nosebleeds,  rhinorrhea, sneezing, sore throat and voice change.   Eyes: Negative for visual disturbance.  Respiratory: Positive for cough and shortness of breath. Negative for choking.   Cardiovascular: Negative for chest pain and leg swelling.  Gastrointestinal: Negative for vomiting, abdominal pain and diarrhea.  Genitourinary: Negative for difficulty urinating.  Musculoskeletal: Negative for arthralgias.  Skin: Negative for rash.  Neurological: Negative for tremors, syncope and headaches.  Hematological: Does not bruise/bleed easily.       Objective:   Physical Exam  amb wf nad  Wt Readings from Last 3 Encounters:  01/24/15 135 lb 9.6 oz (61.508 kg)  10/18/14 139 lb 1.9 oz (63.104 kg)  09/20/14 138 lb 12.8 oz (62.959 kg)    Vital signs reviewed   HEENT: top dentures/ partial lower   turbinates, and orophanx. Nl external ear canals without cough reflex   NECK :  without JVD/Nodes/TM/ nl carotid upstrokes bilaterally   LUNGS: no acc muscle use, clear to A and P bilaterally without cough on insp or exp maneuvers   CV:  RRR  no s3 or murmur or increase in P2, no edema   ABD:  soft and nontender with nl excursion in the supine position. No bruits or organomegaly, bowel sounds nl  MS:  warm without deformities, calf tenderness, cyanosis or clubbing  SKIN: warm and dry without lesions    NEURO:  alert, approp, no deficits  I personally reviewed images and agree with radiology impression as follows:  CXR:  09/20/14  Underlying emphysema. No edema or consolidation.       Assessment & Plan:

## 2015-01-24 NOTE — Patient Instructions (Addendum)
Dulera 100 Take 2 puffs first thing in am and then another 2 puffs about 12 hours later and stop singulair   Take nexium 2 capsules at least 30-60 minutes before first meal of the day  Work on inhaler technique:  relax and gently blow all the way out then take a nice smooth deep breath back in, triggering the inhaler at same time you start breathing in.  Hold for up to 5 seconds if you can. Blow out thru nose. Rinse and gargle with water when done  Only use your albuterol as a rescue medication to be used if you can't catch your breath by resting or doing a relaxed purse lip breathing pattern.  - The less you use it, the better it will work when you need it. - Ok to use up to 2 puffs  every 4 hours if you must but call for immediate appointment if use goes up over your usual need - Don't leave home without it !!  (think of it like the spare tire for your car)   Pepcid ac 20 mg at bedtime along chlorpheniramine 4 mg 1-2 at bedtime   GERD (REFLUX)  is an extremely common cause of respiratory symptoms just like yours , many times with no obvious heartburn at all.    It can be treated with medication, but also with lifestyle changes including elevation of the head of your bed (ideally with 6 inch  bed blocks),  Smoking cessation, avoidance of late meals, excessive alcohol, and avoid fatty foods, chocolate, peppermint, colas, red wine, and acidic juices such as orange juice.  NO MINT OR MENTHOL PRODUCTS SO NO COUGH DROPS  USE SUGARLESS CANDY INSTEAD (Jolley ranchers or Stover's or Life Savers) or even ice chips will also do - the key is to swallow to prevent all throat clearing. NO OIL BASED VITAMINS - use powdered substitutes.    Please schedule a follow up office visit in 2  weeks, sooner if needed  Late add: If she is on fisinopril she will need a substitute. diovan 160 generic one daily should do

## 2015-01-25 ENCOUNTER — Telehealth: Payer: Self-pay | Admitting: *Deleted

## 2015-01-25 ENCOUNTER — Encounter: Payer: Self-pay | Admitting: Internal Medicine

## 2015-01-25 NOTE — Assessment & Plan Note (Signed)
Record is unclear whether she is on an ACE inhibitor or not.  ACE inhibitors are problematic in  pts with airway complaints because  even experienced pulmonologists can't always distinguish ace effects from copd/asthma.  By themselves they don't actually cause a problem, much like oxygen can't by itself start a fire, but they certainly serve as a powerful catalyst or enhancer for any "fire"  or inflammatory process in the upper airway, be it caused by an ET  tube or more commonly reflux (especially in the obese or pts with known GERD or who are on biphoshonates).    In the era of ARB near equivalency until we have a better handle on the reversibility of the airway problem, it just makes sense to avoid ACEI  entirely in the short run and then decide later, having established a level of airway control using a reasonable limited regimen, whether to add back ace but even then being very careful to observe the pt for worsening airway control and number of meds used/ needed to control symptoms.

## 2015-01-25 NOTE — Assessment & Plan Note (Signed)
-   echo 07/2014  Left ventricle: The cavity size was normal. There was mild concentric hypertrophy. Systolic function was normal. The estimated ejection fraction was in the range of 60% to 65%. Wall motion was normal; there were no regional wall motion abnormalities. Doppler parameters are consistent with abnormal left ventricular relaxation (grade 1 diastolic dysfunction). Doppler parameters are consistent with high ventricular filling pressure. - Aortic valve: Mildly calcified annulus. Trileaflet. - Mitral valve: Mildly calcified annulus. - Tricuspid valve: There was mild-moderate regurgitation. - Pulmonary arteries: PA peak pressure: 35 mm Hg (S). Mildly elevated pulmonary pressures - 01/24/2015  Walked RA x 3 laps @ 185 ft each stopped due to min sob, nl pace, no desats   Symptoms are markedly disproportionate to objective findings and not clear this is a lung problem but pt does appear to have difficult airway management issues.  Despite changes on cxr (which may be related to premature birth) this is clearly not typical copd.  DDX of  difficult airways management all start with A and  include Adherence, Ace Inhibitors, Acid Reflux, Active Sinus Disease, Alpha 1 Antitripsin deficiency, Anxiety masquerading as Airways dz,  ABPA,  allergy(esp in young), Aspiration (esp in elderly), Adverse effects of meds,  Active smokers, A bunch of PE's (a small clot burden can't cause this syndrome unless there is already severe underlying pulm or vascular dz with poor reserve) plus two Bs  = Bronchiectasis and Beta blocker use..and one C= CHF   Adherence is always the initial "prime suspect" and is a multilayered concern that requires a "trust but verify" approach in every patient - starting with knowing how to use medications, especially inhalers, correctly, keeping up with refills and understanding the fundamental difference between maintenance and prns vs those medications only taken for a  very short course and then stopped and not refilled.  - The proper method of use, as well as anticipated side effects, of a metered-dose inhaler are discussed and demonstrated to the patient. Improved effectiveness after extensive coaching during this visit to a level of approximately  75% so try dulera 100 2bid x 2 weeks  ? ACEi > if on them needs trial off > see hbp  ? Acid (or non-acid) GERD > always difficult to exclude as up to 75% of pts in some series report no assoc GI/ Heartburn symptoms> rec max (24h)  acid suppression and diet restrictions/ reviewed and instructions given in writing.   ? Anxiety ? Dx of exclusion  ? chf > does have mild diastolic dysfunction but doubt it's the cause of her symptoms  I had an extended discussion with the patient reviewing all relevant studies completed to date and  lasting 35 m    Each maintenance medication was reviewed in detail including most importantly the difference between maintenance and prns and under what circumstances the prns are to be triggered using an action plan format that is not reflected in the computer generated alphabetically organized AVS.    Please see instructions for details which were reviewed in writing and the patient given a copy highlighting the part that I personally wrote and discussed at today's ov.

## 2015-01-25 NOTE — Telephone Encounter (Signed)
Spoke with the pt and confirmed that she is not taking lisinopril

## 2015-01-25 NOTE — Telephone Encounter (Signed)
-----   Message from Nyoka Cowden, MD sent at 01/25/2015  7:02 AM EDT ----- : If she is on fisinopril she will need a substitute. diovan 160 generic one daily should do

## 2015-01-30 ENCOUNTER — Telehealth: Payer: Self-pay | Admitting: Cardiology

## 2015-01-30 ENCOUNTER — Encounter: Payer: Self-pay | Admitting: *Deleted

## 2015-01-30 NOTE — Telephone Encounter (Signed)
WANTS to discuss her feet swelling

## 2015-01-30 NOTE — Telephone Encounter (Signed)
Pt c/o swelling in feet/hands and legs X 3 weeks. Pt says Dr. Loney Hering d/c'd lasix last week but pt is still taking 60 mg daily due to swelling. Pt denies CP/SOB/dizziness. Says Dr. Loney Hering increase amlodipine 20 mg daily but pt has not taken in 1 week. Swelling has not improved. Will request labs/office notes to confirm med changes and forward to DOD as Dr. [name] is off today

## 2015-02-01 NOTE — Telephone Encounter (Signed)
Spoke with patient and advised her that since Dr. Pauletta Browns office made recent changes to her medications that now is causing her problems with swelling that she should contact their office about this issue. Patient advised if Dr. Pauletta Browns office thinks she need to see the cardiologist, they should contact our office for an appointment. Patient verbalized understanding of plan.

## 2015-02-01 NOTE — Telephone Encounter (Signed)
Please forward to Dr [name] as this is his patient.

## 2015-02-07 ENCOUNTER — Encounter: Payer: Self-pay | Admitting: Internal Medicine

## 2015-02-07 ENCOUNTER — Ambulatory Visit (INDEPENDENT_AMBULATORY_CARE_PROVIDER_SITE_OTHER): Payer: Medicare Other | Admitting: Internal Medicine

## 2015-02-07 VITALS — BP 120/80 | HR 55 | Ht 63.5 in | Wt 135.0 lb

## 2015-02-07 DIAGNOSIS — R0600 Dyspnea, unspecified: Secondary | ICD-10-CM | POA: Diagnosis not present

## 2015-02-07 DIAGNOSIS — Z23 Encounter for immunization: Secondary | ICD-10-CM | POA: Diagnosis not present

## 2015-02-07 NOTE — Progress Notes (Signed)
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 Pulmonary Consult    Referred by Dr. Ernestine Conrad. Pt c/o dyspnea "for years". She states that she gets SOB with or without exertion. She also c/o cough at night-  non prod.  She uses o2 with sleep at 2lpm.   3-4 am's per week difficulty breathing at rest so uses proventil with some benefit  And noct lots of cough but dry   Prednisone makes it better but nothing really corrects it completely/ discouraged she's been told it's copd but never had pfts. As of 12/20/14 was on ACEi but did not disclose this at time of ov. Has been on advair not helping rec Dulera 100 Take 2 puffs first thing in am and then another 2 puffs about 12 hours later and stop singulair  Take nexium 2 capsules at least 30-60 minutes before first meal of the day Work on inhaler technique:    Only use your albuterol as a rescue medication  Pepcid ac 20 mg at bedtime along chlorpheniramine 4 mg 1-2 at bedtime  GERD diet   Please schedule a follow up office visit in 2  weeks, sooner if needed  Late add: If she is on fisinopril she will need a substitute. diovan 160 generic one daily should do   02/07/2015 f/u ov/Tryston Gilliam re: chronic cough / pt stopped dulera and not better on vs off  Chief Complaint  Patient presents with  . Follow-up    Pt states her breathing is much improved. She is walking 1 mile every am and pm with no problem and has not had to use rescue inhaler. Cough has  improved some but has not resolved.       Cough tends to wake her up in am and does not take h1 at hs as rec/ not really coughing much rest of the day unless notes pnds s purulent or excess  sputum production   Only using hydrodiuril for bp now  No obvious day to day or daytime variability or assoc sob or cp or chest tightness, subjective wheeze or overt sinus or hb symptoms. No unusual exp hx or h/o childhood pna/ asthma or knowledge of premature birth.  Sleeping ok without nocturnal exacerbation  of respiratory  c/o's or need for noct saba. Also denies any obvious fluctuation of symptoms with weather or environmental changes or other aggravating or alleviating factors except as outlined above   Current Medications, Allergies, Complete Past Medical History, Past Surgical History, Family History, and Social History were reviewed in [employer] record.  ROS  The following are not active complaints unless bolded sore throat, dysphagia, dental problems, itching, sneezing,  nasal congestion or excess/ purulent secretions, ear ache,   fever, chills, sweats, unintended wt loss, classically pleuritic or exertional cp, hemoptysis,  orthopnea pnd or leg swelling, presyncope, palpitations, abdominal pain, anorexia, nausea, vomiting, diarrhea  or change in bowel or bladder habits, change in stools or urine, dysuria,hematuria,  rash, arthralgias, visual complaints, headache, numbness, weakness or ataxia or problems with walking or coordination,  change in mood/affect or memory.                   Objective:   Physical Exam  amb wf nad   02/07/2015       135  Wt Readings from Last 3 Encounters:  01/24/15 135 lb 9.6 oz (61.508 kg)  10/18/14 139 lb 1.9 oz (63.104 kg)  09/20/14 138 lb 12.8 oz (62.959 kg)    Vital signs reviewed   HEENT: top dentures/ partial lower   turbinates, and orophanx. Nl external ear canals without cough reflex   NECK :  without JVD/Nodes/TM/ nl  carotid upstrokes bilaterally   LUNGS: no acc muscle use, clear to A and P bilaterally without cough on insp or exp maneuvers   CV:  RRR  no s3 or murmur or increase in P2, no edema   ABD:  soft and nontender with nl excursion in the supine position. No bruits or organomegaly, bowel sounds nl  MS:  warm without deformities, calf tenderness, cyanosis or clubbing  SKIN: warm and dry without lesions    NEURO:  alert, approp, no deficits     I personally reviewed images and agree with radiology impression as follows:  CXR:  09/20/14  Underlying emphysema. No edema or consolidation.          Assessment & Plan:   Outpatient Encounter Prescriptions as of 02/07/2015  Medication Sig  . albuterol (PROVENTIL HFA;VENTOLIN HFA) 108 (90 BASE) MCG/ACT inhaler Inhale 2 puffs into the lungs every 6 (six) hours as needed for wheezing.  [name] ALPRAZolam (XANAX) 0.25 MG tablet Take 0.25 mg by mouth 2 (two) times daily as needed for anxiety.  [name] aspirin 81 MG tablet Take 81 mg by mouth daily.  [name] atorvastatin (LIPITOR) 80 MG tablet Take 80 mg by mouth at bedtime.  . cetirizine (ZYRTEC) 10 MG tablet Take 10 mg by mouth daily.  [name] esomeprazole (NEXIUM) 20 MG capsule Take x 2 Take 30-60 min before first meal of the day  . famotidine (PEPCID) 20 MG tablet One at bedtime  . fluticasone (FLONASE) 50 MCG/ACT nasal spray Place 2 sprays into the nose daily.  . furosemide (LASIX) 40 MG tablet Take 40 mg by mouth every morning.   [name] guaiFENesin (MUCINEX) 600 MG 12 hr tablet Take 1,200 mg by mouth 2 (two) times daily as needed for congestion.  [name] levothyroxine (SYNTHROID, LEVOTHROID) 88 MCG tablet Take 88 mcg by mouth every evening.  . potassium chloride (MICRO-K) 10 MEQ CR capsule Take 30 mEq by mouth daily.  . [DISCONTINUED] potassium chloride SA (K-DUR,KLOR-CON) 20 MEQ tablet Take 1 tablet (20 mEq total) by mouth daily. (Patient taking differently: Take 40 mEq by mouth 2 (two) times daily. )  . [DISCONTINUED]  amLODipine (NORVASC) 10 MG tablet Take 10 mg by mouth daily.   . [DISCONTINUED] mometasone-formoterol (DULERA) 100-5 MCG/ACT AERO Take 2 puffs first thing in am and then another 2 puffs about 12 hours later. (Patient not taking: Reported on 02/07/2015)   No facility-administered encounter medications on file as of 02/07/2015.

## 2015-02-07 NOTE — Patient Instructions (Signed)
For drainage take chlorpheriamine  4 mg - take one  every 4 hours available over the counter (may cause drowsiness so good to take at bedtime and may help some of your am cough/ congestion)    GERD (REFLUX)  is an extremely common cause of respiratory symptoms just like yours , many times with no obvious heartburn at all.    It can be treated with medication, but also with lifestyle changes including elevation of the head of your bed (ideally with 6 inch  bed blocks),  Smoking cessation, avoidance of late meals, excessive alcohol, and avoid fatty foods, chocolate, peppermint, colas, red wine, and acidic juices such as orange juice.  NO MINT OR MENTHOL PRODUCTS SO NO COUGH DROPS  USE SUGARLESS CANDY INSTEAD (Jolley ranchers or Stover's or Life Savers) or even ice chips will also do - the key is to swallow to prevent all throat clearing. NO OIL BASED VITAMINS - use powdered substitutes.    No need for pulmonary follow up unless limited by your breathing or a persistent cough

## 2015-02-08 NOTE — Assessment & Plan Note (Addendum)
-   echo 07/2014  Left ventricle: The cavity size was normal. There was mild concentric hypertrophy. Systolic function was normal. The estimated ejection fraction was in the range of 60% to 65%. Wall motion was normal; there were no regional wall motion abnormalities. Doppler parameters are consistent with abnormal left ventricular relaxation (grade 1 diastolic dysfunction). Doppler parameters are consistent with high ventricular filling pressure. - Aortic valve: Mildly calcified annulus. Trileaflet. - Mitral valve: Mildly calcified annulus. - Tricuspid valve: There was mild-moderate regurgitation. - Pulmonary arteries: PA peak pressure: 35 mm Hg (S). Mildly elevated pulmonary pressures - 01/24/2015  Walked RA x 3 laps @ 185 ft each stopped due to min sob, nl pace, no desats   This is clearly improved off of ACE inhibitor's and off of all inhalers and just taking medications for reflux. 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 If she does find she has a persistent cough or limiting dyspnea while on gerd rx  we need to see her back here as we really don't have another good explanation for her symptoms.  I had an extended discussion with the patient reviewing all relevant studies completed to date and  lasting 15 to 20 minutes of a 25 minute visit    Each maintenance medication was reviewed in detail including most importantly the difference between maintenance and prns and under what circumstances the prns are to be triggered using an action plan format that is not reflected in the computer generated alphabetically organized AVS.    Please see instructions for details which were reviewed in writing and the patient given a copy highlighting the part that I personally wrote and discussed at today's ov.

## 2015-02-10 ENCOUNTER — Other Ambulatory Visit (HOSPITAL_COMMUNITY): Payer: Self-pay | Admitting: Otolaryngology

## 2015-03-22 ENCOUNTER — Other Ambulatory Visit (HOSPITAL_COMMUNITY): Payer: Medicare Other

## 2015-03-29 ENCOUNTER — Other Ambulatory Visit (HOSPITAL_COMMUNITY): Payer: Medicare Other

## 2015-04-11 ENCOUNTER — Encounter: Payer: Self-pay | Admitting: Cardiology

## 2015-04-11 ENCOUNTER — Encounter: Payer: Self-pay | Admitting: *Deleted

## 2015-04-11 ENCOUNTER — Ambulatory Visit (INDEPENDENT_AMBULATORY_CARE_PROVIDER_SITE_OTHER): Payer: Medicare Other | Admitting: Cardiology

## 2015-04-11 VITALS — BP 104/70 | HR 58 | Ht 63.0 in | Wt 129.0 lb

## 2015-04-11 DIAGNOSIS — E785 Hyperlipidemia, unspecified: Secondary | ICD-10-CM

## 2015-04-11 DIAGNOSIS — R001 Bradycardia, unspecified: Secondary | ICD-10-CM

## 2015-04-11 DIAGNOSIS — I10 Essential (primary) hypertension: Secondary | ICD-10-CM

## 2015-04-11 DIAGNOSIS — I25709 Atherosclerosis of coronary artery bypass graft(s), unspecified, with unspecified angina pectoris: Secondary | ICD-10-CM

## 2015-04-11 NOTE — Patient Instructions (Signed)
Your physician recommends that you continue on your current medications as directed. Please refer to the Current Medication list given to you today. Your physician recommends that you schedule a follow-up appointment in: 6 months. You will receive a reminder letter in the mail in about 4 months reminding you to call and schedule your appointment. If you don't receive this letter, please contact our office. 

## 2015-04-11 NOTE — Progress Notes (Signed)
Cardiology Office Note  Date: 04/11/2015   ID: Debbie Dusterlizabeth C Frink, DOB May 04, 1941, MRN [id number]  PCP: Ernestine ConradBLUTH, KIRK, MD  Primary Cardiologist: Nona DellSamuel Rasmus Preusser, MD   Chief Complaint  Patient presents with  . Coronary Artery Disease    History of Present Illness: Debbie [name] is a 74 y.o. female last seen in May. She is here for a follow-up cardiac visit. Records indicate recent hospital stay [facility] in mid November for evaluation of chest pain that was fairly sudden in onset. Troponin T levels were normal arguing against ACS. ECG showed sinus rhythm with left atrial enlargement and nonspecific T-wave changes. She had an elevated d-dimer and underwent a ventilation/perfusion lung scan which was very low probability for pulmonary embolus. Discharge summary indicates unstable angina pectoris as diagnosis, and she was placed on Imdur 30 mg daily.  She presents today stating that she has not had any recurring chest pain. We reviewed her medications which are outlined below and do include Imdur 30 mg daily at this point. She states that she has had mild headache with this but is tolerating it so far.  She does have a previous history of intolerance to beta blocker related to bradycardia, specifically atenolol.  Ischemic evaluation from March of this year was low risk as noted below.  Lab work [facility] was reviewed as well.   Past Medical History  Diagnosis Date  . COPD (chronic obstructive pulmonary disease) (HCC)   . Essential hypertension   . Hypothyroidism   . GERD (gastroesophageal reflux disease)   . Allergic rhinitis   . Anxiety   . Hyperlipidemia   . CAD (coronary artery disease)     [facility] [place]  . Carotid artery disease (HCC)   . Arthritis   . Osteoporosis   . Bladder spasms   . Cystocele   . Vaginal vault prolapse   . Hypothyroidism   . Renal insufficiency   . Collagen vascular disease (HCC)   . Asthma     Past  Surgical History  Procedure Laterality Date  . Cornea laceration repair    . Colonoscopy    . Hernia repair    . Cataracts      REMOVED  . Coronary artery bypass graft      X 5 VESSELS  . Tubal ligation    . Cystoscopy N/A 04/06/2013    Procedure: CYSTOSCOPY;  Surgeon: Martina SinnerScott A MacDiarmid, MD;  Location: [facility];  Service: Urology;  Laterality: N/A;  . Anterior and posterior repair N/A 04/06/2013    Procedure: CYSTOCELE REPAIR ;  Surgeon: Martina SinnerScott A MacDiarmid, MD;  Location: [facility];  Service: Urology;  Laterality: N/A;    Current Outpatient Prescriptions  Medication Sig Dispense Refill  . albuterol (PROVENTIL HFA;VENTOLIN HFA) 108 (90 BASE) MCG/ACT inhaler Inhale 2 puffs into the lungs every 6 (six) hours as needed for wheezing.    [name]. ALPRAZolam (XANAX) 0.25 MG tablet Take 0.25 mg by mouth 2 (two) times daily as needed for anxiety.    [name]. aspirin 81 MG tablet Take 81 mg by mouth daily.    [name]. atorvastatin (LIPITOR) 80 MG tablet Take 80 mg by mouth at bedtime.    . cetirizine (ZYRTEC) 10 MG tablet Take 10 mg by mouth daily.    [name]. esomeprazole (NEXIUM) 20 MG capsule Take x 2 Take 30-60 min before first meal of the day    . famotidine (PEPCID) 20 MG tablet One at bedtime 30 tablet  2  . fluticasone (FLONASE) 50 MCG/ACT nasal spray Place 2 sprays into the nose daily.    . furosemide (LASIX) 40 MG tablet Take 40 mg by mouth every morning.     [name] guaiFENesin (MUCINEX) 600 MG 12 hr tablet Take 1,200 mg by mouth 2 (two) times daily as needed for congestion.    . isosorbide mononitrate (IMDUR) 30 MG 24 hr tablet Take 30 mg by mouth daily.    [name] levothyroxine (SYNTHROID, LEVOTHROID) 112 MCG tablet Take 112 mcg by mouth daily before breakfast.    . montelukast (SINGULAIR) 10 MG tablet Take 10 mg by mouth at bedtime.    . nitroGLYCERIN (NITROSTAT) 0.4 MG SL tablet Place 0.4 mg under the tongue every 5 (five) minutes as needed for chest pain.    . potassium chloride (MICRO-K) 10 MEQ CR capsule Take 30 mEq by  mouth daily.     No current facility-administered medications for this visit.    Allergies:  Ivp dye; Betadine; Codeine; Other; Penicillins; and Sulfa antibiotics   Social History: The patient  reports that she has never smoked. She has never used smokeless tobacco. She reports that she drinks alcohol. She reports that she does not use illicit drugs.   ROS:  Please see the history of present illness. Otherwise, complete review of systems is positive for mild headaches.  All other systems are reviewed and negative.   Physical Exam: VS:  BP 104/70 mmHg  Pulse 58  Ht  (1.6 m)  Wt 129 lb (58.514 kg)  BMI 22.86 kg/m2  SpO2 93%, BMI Body mass index is 22.86 kg/(m^2).  Wt Readings from Last 3 Encounters:  04/11/15 129 lb (58.514 kg)  02/07/15 135 lb (61.236 kg)  01/24/15 135 lb 9.6 oz (61.508 kg)     General: Patient appears comfortable at rest. HEENT: Conjunctiva and lids normal, oropharynx clear. Neck: Supple, no elevated JVP, right carotid bruit, no thyromegaly. Lungs: Decreased breath sounds without wheezing, nonlabored breathing at rest. Cardiac: Regular rate and rhythm, no S3 or significant systolic murmur, no pericardial rub. Abdomen: Soft, nontender, bowel sounds present, no guarding or rebound. Extremities: No pitting edema, distal pulses 2+. Skin: Warm and dry. Musculoskeletal: No kyphosis. Neuropsychiatric: Alert and oriented x3, affect grossly appropriate.   ECG: Tracing from 09/21/2014 showed sinus rhythm with diffuse nonspecific ST-T changes and borderline prolonged QT interval.  Recent Labwork: 09/20/2014: Hemoglobin 13.4; Magnesium 2.0; Platelets 383; TSH 2.428 09/21/2014: BUN 17; Creatinine, Ser 0.92; Potassium 3.3*; Sodium 140  November 2016: BUN 19, creatinine 0.9, AST 11, ALT 14, potassium 3.7, magnesium 2.2, d-dimer 0.92, hemoglobin A1c 6.6, troponin T negative 3, NT proBNP 518, hemoglobin 13.3, platelets 385  Other Studies Reviewed Today:  Carotid Dopplers  10/05/2014: 1-39% bilateral ICA stenoses, greater than 50% right subclavian stenosis.  Lexiscan Cardiolite 08/03/2014: FINDINGS: Stress data: The patient was stressed according to the Lexiscan protocol. The heart rate ranged from 53 up to 86 beats per min. The blood pressure averaged 133/69. The patient experienced non limiting chest pain.  Baseline ECG demonstrated sinus rhythm with a diffuse nonspecific T-wave abnormality. There were more pronounced T-wave inversion is with Lexiscan infusion. No arrhythmias were noted this is a nondiagnostic ECG.  Perfusion: No decreased activity in the left ventricle on stress imaging to suggest reversible ischemia or infarction.  Wall Motion: Normal left ventricular wall motion. No left ventricular dilation.  Left Ventricular Ejection Fraction: 54 %  End diastolic volume 43 ml  End systolic volume 20 ml  IMPRESSION: 1. No reversible ischemia or infarction.  2. Normal left ventricular wall motion.  3. Left ventricular ejection fraction 54%  4. Low-risk stress test findings*.  Assessment and Plan:  1. CAD status post CABG in 2006. Cardiolite study from earlier this year was low risk as outlined above. She did have a recent hospital stay at [facility] secondary to chest pain, although troponin T levels were completely normal and her ECG was nonspecific. Agree with addition of low-dose Imdur for now. Continue observation on medical therapy.  2. Essential hypertension, blood pressure is well controlled today on current regimen.  3. Hyperlipidemia, continues on statin therapy.  4. Sinus bradycardia, asymptomatic at this time. Had prior intolerance to atenolol.  Current medicines were reviewed with the patient today.  Disposition: FU with me in 6 months.   Signed, Jonelle Sidle, MD, [facility] 04/11/2015 11:20 AM    [facility] at [facility] [address] [place], [place], [place] [address] Phone: [phone]; Fax:  [phone]

## 2015-04-20 ENCOUNTER — Encounter (HOSPITAL_COMMUNITY)
Admission: RE | Admit: 2015-04-20 | Discharge: 2015-04-20 | Disposition: A | Discharge status: Home / Self Care | Payer: Medicare Other | Source: Ambulatory Visit | Attending: Otolaryngology | Admitting: Otolaryngology

## 2015-04-20 ENCOUNTER — Other Ambulatory Visit (HOSPITAL_COMMUNITY): Payer: Self-pay | Admitting: Otolaryngology

## 2015-04-20 ENCOUNTER — Encounter (HOSPITAL_COMMUNITY): Payer: Self-pay

## 2015-04-20 DIAGNOSIS — Z7982 Long term (current) use of aspirin: Secondary | ICD-10-CM | POA: Diagnosis not present

## 2015-04-20 DIAGNOSIS — K219 Gastro-esophageal reflux disease without esophagitis: Secondary | ICD-10-CM | POA: Diagnosis not present

## 2015-04-20 DIAGNOSIS — J342 Deviated nasal septum: Secondary | ICD-10-CM | POA: Diagnosis not present

## 2015-04-20 DIAGNOSIS — J449 Chronic obstructive pulmonary disease, unspecified: Secondary | ICD-10-CM | POA: Diagnosis not present

## 2015-04-20 DIAGNOSIS — Z01818 Encounter for other preprocedural examination: Secondary | ICD-10-CM | POA: Insufficient documentation

## 2015-04-20 DIAGNOSIS — I10 Essential (primary) hypertension: Secondary | ICD-10-CM | POA: Diagnosis not present

## 2015-04-20 DIAGNOSIS — Z79899 Other long term (current) drug therapy: Secondary | ICD-10-CM | POA: Insufficient documentation

## 2015-04-20 DIAGNOSIS — J45909 Unspecified asthma, uncomplicated: Secondary | ICD-10-CM | POA: Diagnosis not present

## 2015-04-20 DIAGNOSIS — I2510 Atherosclerotic heart disease of native coronary artery without angina pectoris: Secondary | ICD-10-CM | POA: Diagnosis not present

## 2015-04-20 DIAGNOSIS — E039 Hypothyroidism, unspecified: Secondary | ICD-10-CM | POA: Insufficient documentation

## 2015-04-20 DIAGNOSIS — N289 Disorder of kidney and ureter, unspecified: Secondary | ICD-10-CM | POA: Diagnosis not present

## 2015-04-20 DIAGNOSIS — Z951 Presence of aortocoronary bypass graft: Secondary | ICD-10-CM | POA: Insufficient documentation

## 2015-04-20 DIAGNOSIS — Z01812 Encounter for preprocedural laboratory examination: Secondary | ICD-10-CM | POA: Diagnosis not present

## 2015-04-20 DIAGNOSIS — J329 Chronic sinusitis, unspecified: Secondary | ICD-10-CM | POA: Diagnosis not present

## 2015-04-20 DIAGNOSIS — E785 Hyperlipidemia, unspecified: Secondary | ICD-10-CM | POA: Insufficient documentation

## 2015-04-20 LAB — CBC
HCT: 40.1 % (ref 36.0–46.0)
Hemoglobin: 12.8 g/dL (ref 12.0–15.0)
MCH: 27.6 pg (ref 26.0–34.0)
MCHC: 31.9 g/dL (ref 30.0–36.0)
MCV: 86.4 fL (ref 78.0–100.0)
Platelets: 356 10*3/uL (ref 150–400)
RBC: 4.64 MIL/uL (ref 3.87–5.11)
RDW: 14.3 % (ref 11.5–15.5)
WBC: 6.5 10*3/uL (ref 4.0–10.5)

## 2015-04-20 LAB — BASIC METABOLIC PANEL
Anion gap: 7 (ref 5–15)
BUN: 8 mg/dL (ref 6–20)
CO2: 31 mmol/L (ref 22–32)
Calcium: 9.5 mg/dL (ref 8.9–10.3)
Chloride: 104 mmol/L (ref 101–111)
Creatinine, Ser: 1.01 mg/dL — ABNORMAL HIGH (ref 0.44–1.00)
GFR calc Af Amer: >60 mL/min (ref >60)
GFR calc non Af Amer: 53 mL/min — ABNORMAL LOW (ref >60)
Glucose, Bld: 97 mg/dL (ref 65–99)
Potassium: 3.9 mmol/L (ref 3.5–5.1)
Sodium: 142 mmol/L (ref 135–145)

## 2015-04-20 NOTE — Pre-Procedure Instructions (Signed)
    Debbie [name]  04/20/2015      [facility] [address] - [place], [place] - [address] [place] [id number] Phone: [phone] Fax: [phone]    Your procedure is scheduled on 04/28/15.  [facility] at [facility].M.  Call this number if you have problems the morning of surgery:  [phone]   Remember:  Do not eat food or drink liquids after midnight.  Take these medicines the morning of surgery with A SIP OF WATER --all inhalers,xanax,zyrtec,nexium,imdur,synthroid   Do not wear jewelry, make-up or nail polish.  Do not wear lotions, powders, or perfumes.  You may wear deodorant.  Do not shave 48 hours prior to surgery.  Men may shave face and neck.  Do not bring valuables to the hospital.  [facility] is not responsible for any belongings or valuables.  Contacts, dentures or bridgework may not be worn into surgery.  Leave your suitcase in the car.  After surgery it may be brought to your room.  For patients admitted to the hospital, discharge time will be determined by your treatment team.  Patients discharged the day of surgery will not be allowed to drive home.   Name and phone number of your driver:    Special instructions:    Please read over the following fact sheets that you were given. Pain Booklet, Coughing and Deep Breathing and Surgical Site Infection Prevention

## 2015-04-21 NOTE — Progress Notes (Signed)
Anesthesia Chart Review:  Pt is 74 year old female scheduled for B endoscopic sinus surgery with navigation, septoplasty, possible tonsillectomy on 04/28/2015 with Dr. Emeline DarlingGore.   Cardiologist is Dr. Nona DellSamuel McDowell, last office visit 04/11/15. Pulmonologist is Dr. Sandrea HughsMichael Wert, last office visit 02/07/15.   PMH includes:  CAD (CABG 2006), carotid artery disease, HTN, hyperlipidemia, COPD, asthma, hypothyroidism, renal insufficiency, GERD.  Never smoker. BMI 23. S/p cystoscopy, cystocele repair 04/06/13.   Medications include: albuterol, ASA, lipitor, nexium, pepcid, lasix, imdur, levothyroxine, potassium.   Preoperative labs reviewed.    Chest x-ray 09/20/14 reviewed. Underlying emphysema. No edema of consolidation.   Carotid duplex US 10/05/14: 1-39% RICA stenosis, with has decreased from prior study. Stable 1-39% LICA stenosis.  Cardiac event monitor 09/30/14: Sinus rhythm present throughout. No obvious arrhythmias or pauses.  Nuclear stress test 08/03/14:  1. No reversible ischemia or infarction. 2. Normal left ventricular wall motion.  3. Left ventricular ejection fraction 54% 4. Low-risk stress test findings  Echo 08/03/14:  - Left ventricle: The cavity size was normal. There was mild concentric hypertrophy. Systolic function was normal. The estimated ejection fraction was in the range of 60% to 65%. Wall motion was normal; there were no regional wall motion abnormalities. Doppler parameters are consistent with abnormal left ventricular relaxation (grade 1 diastolic dysfunction). Doppler parameters are consistent with high ventricular filling pressure. - Aortic valve: Mildly calcified annulus. Trileaflet. - Mitral valve: Mildly calcified annulus. - Tricuspid valve: There was mild-moderate regurgitation. - Pulmonary arteries: PA peak pressure: 35 mm Hg (S). Mildly elevated pulmonary pressures.  Pt had recent episode chest pain, was [facility]. Saw Dr. Diona BrownerMcDowell 11/22 (after  hospital visit), whose notes indicate pt's troponin levels were normal and her EKG was nonspecific. Imdur was added to medication regimen.   Reviewed with Dr. Okey Dupreose.   If no changes, I anticipate pt can proceed with surgery as scheduled.   Rica Mastngela Kabbe, FNP-BC [facility]An[facility]iology Phone: [phone] 04/21/2015 2:08 PM

## 2015-04-24 NOTE — H&P (Addendum)
04/28/2015 11:56 AM  Melvenia BeamGore, Aliece Honold  PREOPERATIVE HISTORY AND PHYSICAL  CHIEF COMPLAINT: chronic/recurrent sinusitis, septal deviation  HISTORY: This is a 74 year old who presents with chronic/recurrent sinusitis, septal deviation, tonsillar hypertrophy.  She now presents for endoscopic sinus surgery, septoplasty.  Dr. Emeline DarlingGore, Clovis RileyMitchell has discussed the risks (bleeding, infection, CSF leak, orbital injury/blindness, scarring, anesthesia risks, etc.), benefits, and alternatives of this procedure. The patient understands the risks and would like to proceed with the procedure. The chances of success of the procedure are >75% and the patient understands this. I personally performed an examination of the patient within 24 hours of the procedure.  PAST MEDICAL HISTORY: Past Medical History  Diagnosis Date  . COPD (chronic obstructive pulmonary disease) (HCC)   . Essential hypertension   . Hypothyroidism   . GERD (gastroesophageal reflux disease)   . Allergic rhinitis   . Anxiety   . Hyperlipidemia   . CAD (coronary artery disease)     [facility] [place]  . Carotid artery disease (HCC)   . Arthritis   . Osteoporosis   . Bladder spasms   . Cystocele   . Vaginal vault prolapse   . Hypothyroidism   . Renal insufficiency   . Collagen vascular disease (HCC)   . Asthma     PAST SURGICAL HISTORY: Past Surgical History  Procedure Laterality Date  . Cornea laceration repair    . Colonoscopy    . Hernia repair    . Cataracts      REMOVED  . Coronary artery bypass graft      X 5 VESSELS  . Tubal ligation    . Cystoscopy N/A 04/06/2013    Procedure: CYSTOSCOPY;  Surgeon: Martina SinnerScott A MacDiarmid, MD;  Location: [facility];  Service: Urology;  Laterality: N/A;  . Anterior and posterior repair N/A 04/06/2013    Procedure: CYSTOCELE REPAIR ;  Surgeon: Martina SinnerScott A MacDiarmid, MD;  Location: [facility];  Service: Urology;  Laterality: N/A;    MEDICATIONS: No current facility-administered medications  on file prior to encounter.   Current Outpatient Prescriptions on File Prior to Encounter  Medication Sig Dispense Refill  . albuterol (PROVENTIL HFA;VENTOLIN HFA) 108 (90 BASE) MCG/ACT inhaler Inhale 2 puffs into the lungs every 6 (six) hours as needed for wheezing.    [name]. ALPRAZolam (XANAX) 0.25 MG tablet Take 0.25 mg by mouth 2 (two) times daily as needed for anxiety.    [name]. aspirin 81 MG tablet Take 81 mg by mouth daily.    [name]. atorvastatin (LIPITOR) 80 MG tablet Take 80 mg by mouth at bedtime.    . cetirizine (ZYRTEC) 10 MG tablet Take 10 mg by mouth daily.    [name]. esomeprazole (NEXIUM) 20 MG capsule Take x 2 Take 30-60 min before first meal of the day (Patient taking differently: Take 40 mg by mouth daily before breakfast. Take x 2 Take 30-60 min before first meal of the day)    . famotidine (PEPCID) 20 MG tablet One at bedtime (Patient taking differently: Take 20 mg by mouth at bedtime. One at bedtime) 30 tablet 2  . fluticasone (FLONASE) 50 MCG/ACT nasal spray Place 2 sprays into the nose daily.    . furosemide (LASIX) 40 MG tablet Take 40 mg by mouth every morning.     [name]. guaiFENesin (MUCINEX) 600 MG 12 hr tablet Take 1,200 mg by mouth 2 (two) times daily as needed for congestion.    . potassium chloride (MICRO-K) 10 MEQ CR capsule Take 30 mEq by  mouth 2 (two) times daily.       ALLERGIES: Allergies  Allergen Reactions  . Ivp Dye [Iodinated Diagnostic Agents] Swelling    Kidney Dye  . Betadine [Povidone Iodine] Rash  . Codeine Nausea And Vomiting  . Other Other (See Comments)    ALL NARCOTICS  . Penicillins Rash  . Sulfa Antibiotics Rash    SOCIAL HISTORY: Social History   Social History  . Marital Status: Legally Separated    Spouse Name: N/A  . Number of Children: 5  . Years of Education: N/A   Occupational History  . Retired    Social History Main Topics  . Smoking status: Never Smoker   . Smokeless tobacco: Never Used  . Alcohol Use: 0.0 oz/week    0 Standard drinks  or equivalent per week     Comment: 1 drink 1 time a year  . Drug Use: No  . Sexual Activity: Not on file   Other Topics Concern  . Not on file   Social History Narrative    FAMILY HISTORY:  Family History  Problem Relation Age of Onset  . Cirrhosis Father   . Lung disease Father   . Diabetes Mellitus II Mother   . Heart Problems Mother     REVIEW OF SYSTEMS:  HEENT: sinus pressure, nasal congestion, otherwise negative x 12 systems except per HPI  PHYSICAL EXAM:  GENERAL:  NAD VITAL SIGNS:    Filed Vitals:   04/28/15 1021  BP: 166/67  Pulse: 51  Temp: 98.2 F (36.8 C)  Resp: 18   SKIN:  Warm, dry HEENT:  Very small 1+ symmetric tonsils with no exudate or infection, septal deviation NECK:  supple LYMPH:  No LAD ABDOMEN:  soft MUSCULOSKELETAL: normal strength PSYCH:  Normal affect NEUROLOGIC:  CN 2-12 intact and symmetric  DIAGNOSTIC STUDIES: CT sinuses shows chronic right maxillary opacification with mild inferior left sphenoid mucosal inflammation with septal deviation. CT spine shows symmetric 1+ tonsils  ASSESSMENT AND PLAN: Plan to proceed with functional endoscopic sinus surgery, endoscopic septoplasty. (We discussed tonsillectomy and patient decided not to have this performed today as her tonsils are small and not inflammed. Patient understands the risks, benefits, and alternatives. Informed written consent signed, witnessed, and on chart. 04/28/2015  11:57 AM Melvenia Beam

## 2015-04-28 ENCOUNTER — Ambulatory Visit (HOSPITAL_COMMUNITY): Payer: Medicare Other | Admitting: Anesthesiology

## 2015-04-28 ENCOUNTER — Ambulatory Visit (HOSPITAL_COMMUNITY)
Admission: RE | Admit: 2015-04-28 | Discharge: 2015-04-28 | Disposition: A | Discharge status: Home / Self Care | Payer: Medicare Other | Source: Ambulatory Visit | Attending: Otolaryngology | Admitting: Otolaryngology

## 2015-04-28 ENCOUNTER — Encounter (HOSPITAL_COMMUNITY)
Admission: RE | Disposition: A | Discharge status: Home / Self Care | Payer: Self-pay | Source: Ambulatory Visit | Attending: Otolaryngology

## 2015-04-28 ENCOUNTER — Ambulatory Visit (HOSPITAL_COMMUNITY): Payer: Medicare Other | Admitting: Emergency Medicine

## 2015-04-28 ENCOUNTER — Encounter (HOSPITAL_COMMUNITY): Payer: Self-pay | Admitting: *Deleted

## 2015-04-28 DIAGNOSIS — Z955 Presence of coronary angioplasty implant and graft: Secondary | ICD-10-CM | POA: Insufficient documentation

## 2015-04-28 DIAGNOSIS — J320 Chronic maxillary sinusitis: Secondary | ICD-10-CM | POA: Diagnosis not present

## 2015-04-28 DIAGNOSIS — E039 Hypothyroidism, unspecified: Secondary | ICD-10-CM | POA: Insufficient documentation

## 2015-04-28 DIAGNOSIS — M810 Age-related osteoporosis without current pathological fracture: Secondary | ICD-10-CM | POA: Insufficient documentation

## 2015-04-28 DIAGNOSIS — J322 Chronic ethmoidal sinusitis: Secondary | ICD-10-CM | POA: Insufficient documentation

## 2015-04-28 DIAGNOSIS — J323 Chronic sphenoidal sinusitis: Secondary | ICD-10-CM | POA: Diagnosis not present

## 2015-04-28 DIAGNOSIS — Z7982 Long term (current) use of aspirin: Secondary | ICD-10-CM | POA: Diagnosis not present

## 2015-04-28 DIAGNOSIS — J321 Chronic frontal sinusitis: Secondary | ICD-10-CM | POA: Diagnosis not present

## 2015-04-28 DIAGNOSIS — J342 Deviated nasal septum: Secondary | ICD-10-CM | POA: Diagnosis present

## 2015-04-28 DIAGNOSIS — I2510 Atherosclerotic heart disease of native coronary artery without angina pectoris: Secondary | ICD-10-CM | POA: Diagnosis not present

## 2015-04-28 DIAGNOSIS — Z79899 Other long term (current) drug therapy: Secondary | ICD-10-CM | POA: Insufficient documentation

## 2015-04-28 DIAGNOSIS — Z880 Allergy status to penicillin: Secondary | ICD-10-CM | POA: Insufficient documentation

## 2015-04-28 DIAGNOSIS — F419 Anxiety disorder, unspecified: Secondary | ICD-10-CM | POA: Diagnosis not present

## 2015-04-28 DIAGNOSIS — J45909 Unspecified asthma, uncomplicated: Secondary | ICD-10-CM | POA: Diagnosis not present

## 2015-04-28 DIAGNOSIS — I10 Essential (primary) hypertension: Secondary | ICD-10-CM | POA: Diagnosis not present

## 2015-04-28 DIAGNOSIS — K219 Gastro-esophageal reflux disease without esophagitis: Secondary | ICD-10-CM | POA: Diagnosis not present

## 2015-04-28 DIAGNOSIS — J449 Chronic obstructive pulmonary disease, unspecified: Secondary | ICD-10-CM | POA: Insufficient documentation

## 2015-04-28 DIAGNOSIS — E785 Hyperlipidemia, unspecified: Secondary | ICD-10-CM | POA: Diagnosis not present

## 2015-04-28 DIAGNOSIS — Z882 Allergy status to sulfonamides status: Secondary | ICD-10-CM | POA: Insufficient documentation

## 2015-04-28 DIAGNOSIS — Z91041 Radiographic dye allergy status: Secondary | ICD-10-CM | POA: Diagnosis not present

## 2015-04-28 HISTORY — PX: SEPTOPLASTY: SHX2393

## 2015-04-28 HISTORY — PX: SINUS ENDO W/FUSION: SHX777

## 2015-04-28 SURGERY — SINUS SURGERY, ENDOSCOPIC, USING COMPUTER-ASSISTED NAVIGATION
Anesthesia: General | Site: Nose

## 2015-04-28 MED ORDER — BACITRACIN ZINC 500 UNIT/GM EX OINT
TOPICAL_OINTMENT | CUTANEOUS | Status: DC | PRN
  Administered 2015-04-28: 1 via TOPICAL

## 2015-04-28 MED ORDER — LIDOCAINE HCL (CARDIAC) 20 MG/ML IV SOLN
INTRAVENOUS | Status: AC
  Filled 2015-04-28: qty 5

## 2015-04-28 MED ORDER — LIDOCAINE HCL (CARDIAC) 20 MG/ML IV SOLN
INTRAVENOUS | Status: DC | PRN
  Administered 2015-04-28: 100 mg via INTRAVENOUS

## 2015-04-28 MED ORDER — ONDANSETRON HCL 4 MG/2ML IJ SOLN
INTRAMUSCULAR | Status: DC | PRN
  Administered 2015-04-28: 4 mg via INTRAVENOUS

## 2015-04-28 MED ORDER — LIDOCAINE HCL 4 % MT SOLN
OROMUCOSAL | Status: DC | PRN
  Administered 2015-04-28: 4 mL via TOPICAL

## 2015-04-28 MED ORDER — ROCURONIUM BROMIDE 50 MG/5ML IV SOLN
INTRAVENOUS | Status: AC
  Filled 2015-04-28: qty 1

## 2015-04-28 MED ORDER — SUGAMMADEX SODIUM 200 MG/2ML IV SOLN
INTRAVENOUS | Status: DC | PRN
  Administered 2015-04-28: 200 mg via INTRAVENOUS

## 2015-04-28 MED ORDER — PROPOFOL 10 MG/ML IV BOLUS
INTRAVENOUS | Status: AC
  Filled 2015-04-28: qty 20

## 2015-04-28 MED ORDER — GLYCOPYRROLATE 0.2 MG/ML IJ SOLN
INTRAMUSCULAR | Status: DC | PRN
  Administered 2015-04-28: 0.2 mg via INTRAVENOUS

## 2015-04-28 MED ORDER — LACTATED RINGERS IV SOLN
INTRAVENOUS | Status: DC | PRN
  Administered 2015-04-28 (×2): via INTRAVENOUS

## 2015-04-28 MED ORDER — MUPIROCIN CALCIUM 2 % EX CREA
TOPICAL_CREAM | CUTANEOUS | Status: AC
  Filled 2015-04-28: qty 15

## 2015-04-28 MED ORDER — MIDAZOLAM HCL 2 MG/2ML IJ SOLN
INTRAMUSCULAR | Status: AC
  Filled 2015-04-28: qty 2

## 2015-04-28 MED ORDER — LACTATED RINGERS IV SOLN
INTRAVENOUS | Status: DC
  Administered 2015-04-28: 11:00:00 via INTRAVENOUS

## 2015-04-28 MED ORDER — OXYMETAZOLINE HCL 0.05 % NA SOLN
NASAL | Status: AC
  Filled 2015-04-28: qty 15

## 2015-04-28 MED ORDER — OXYMETAZOLINE HCL 0.05 % NA SOLN
NASAL | Status: DC | PRN
  Administered 2015-04-28: 1

## 2015-04-28 MED ORDER — PROPOFOL 10 MG/ML IV BOLUS
INTRAVENOUS | Status: DC | PRN
  Administered 2015-04-28: 110 mg via INTRAVENOUS

## 2015-04-28 MED ORDER — MIDAZOLAM HCL 5 MG/5ML IJ SOLN
INTRAMUSCULAR | Status: DC | PRN
  Administered 2015-04-28: 2 mg via INTRAVENOUS

## 2015-04-28 MED ORDER — 0.9 % SODIUM CHLORIDE (POUR BTL) OPTIME
TOPICAL | Status: DC | PRN
  Administered 2015-04-28: 1000 mL

## 2015-04-28 MED ORDER — SODIUM CHLORIDE 0.9 % IR SOLN
Status: DC | PRN
  Administered 2015-04-28 (×2): 1000 mL

## 2015-04-28 MED ORDER — SUGAMMADEX SODIUM 200 MG/2ML IV SOLN
200.0000 mg | Freq: Once | INTRAVENOUS | Status: DC
  Filled 2015-04-28: qty 2

## 2015-04-28 MED ORDER — FENTANYL CITRATE (PF) 250 MCG/5ML IJ SOLN
INTRAMUSCULAR | Status: AC
  Filled 2015-04-28: qty 5

## 2015-04-28 MED ORDER — ROCURONIUM BROMIDE 100 MG/10ML IV SOLN
INTRAVENOUS | Status: DC | PRN
  Administered 2015-04-28: 40 mg via INTRAVENOUS

## 2015-04-28 MED ORDER — HYDROMORPHONE HCL 1 MG/ML IJ SOLN: 0.2500 mg | INTRAMUSCULAR | Status: DC | PRN

## 2015-04-28 MED ORDER — CLINDAMYCIN PHOSPHATE 600 MG/50ML IV SOLN: 600.0000 mg | INTRAVENOUS | Status: DC

## 2015-04-28 MED ORDER — FENTANYL CITRATE (PF) 100 MCG/2ML IJ SOLN
INTRAMUSCULAR | Status: DC | PRN
  Administered 2015-04-28: 150 ug via INTRAVENOUS

## 2015-04-28 MED ORDER — ONDANSETRON HCL 4 MG/2ML IJ SOLN: 4.0000 mg | Freq: Once | INTRAMUSCULAR | Status: DC | PRN

## 2015-04-28 MED ORDER — MEPERIDINE HCL 25 MG/ML IJ SOLN: 6.2500 mg | INTRAMUSCULAR | Status: DC | PRN

## 2015-04-28 MED ORDER — LIDOCAINE-EPINEPHRINE 1 %-1:100000 IJ SOLN
INTRAMUSCULAR | Status: AC
  Filled 2015-04-28: qty 1

## 2015-04-28 MED ORDER — ONDANSETRON HCL 4 MG/2ML IJ SOLN
INTRAMUSCULAR | Status: AC
  Filled 2015-04-28: qty 2

## 2015-04-28 MED ORDER — LIDOCAINE-EPINEPHRINE 1 %-1:100000 IJ SOLN
INTRAMUSCULAR | Status: DC | PRN
  Administered 2015-04-28: 20 mL

## 2015-04-28 MED ORDER — EPHEDRINE SULFATE 50 MG/ML IJ SOLN
INTRAMUSCULAR | Status: DC | PRN
  Administered 2015-04-28 (×3): 5 mg via INTRAVENOUS
  Administered 2015-04-28: 10 mg via INTRAVENOUS
  Administered 2015-04-28: 5 mg via INTRAVENOUS

## 2015-04-28 MED ORDER — DEXAMETHASONE SODIUM PHOSPHATE 10 MG/ML IJ SOLN
10.0000 mg | INTRAMUSCULAR | Status: AC
  Administered 2015-04-28: 10 mg via INTRAVENOUS

## 2015-04-28 MED ORDER — CLINDAMYCIN PHOSPHATE 600 MG/50ML IV SOLN
600.0000 mg | INTRAVENOUS | Status: AC
  Administered 2015-04-28: 600 mg via INTRAVENOUS
  Filled 2015-04-28: qty 50

## 2015-04-28 SURGICAL SUPPLY — 80 items
ALLODERM MED 3X7 (Tissue) IMPLANT
APL SKNCLS STERI-STRIP NONHPOA (GAUZE/BANDAGES/DRESSINGS) ×3
ATTRACTOMAT 16X20 MAGNETIC DRP (DRAPES) IMPLANT
BENZOIN TINCTURE PRP APPL 2/3 (GAUZE/BANDAGES/DRESSINGS) ×4 IMPLANT
BLADE INF TURB ROT M4 2 5PK (BLADE) IMPLANT
BLADE RAD 40 CVD SINUS 4MM (BLADE) IMPLANT
BLADE RAD60 ROTATE M4 4 5PK (BLADE) ×2 IMPLANT
BLADE ROTATE RAD 40 4 M4 (BLADE) IMPLANT
BLADE ROTATE TRICUT 4X13 M4 (BLADE) ×4 IMPLANT
BLADE SURG 15 STRL LF DISP TIS (BLADE) IMPLANT
BLADE SURG 15 STRL SS (BLADE)
BUR DIAMOND 13X5 70D (BURR) IMPLANT
BUR DIAMOND CURV 15X5 15D (BURR) IMPLANT
CANISTER SUCTION 2500CC (MISCELLANEOUS) ×4 IMPLANT
CATH ROBINSON RED A/P 10FR (CATHETERS) IMPLANT
CLEANER TIP ELECTROSURG 2X2 (MISCELLANEOUS) ×4 IMPLANT
COAGULATOR SUCT SWTCH 10FR 6 (ELECTROSURGICAL) ×4 IMPLANT
CONT SPEC 4OZ CLIKSEAL STRL BL (MISCELLANEOUS) ×4 IMPLANT
CORDS BIPOLAR (ELECTRODE) IMPLANT
COVER SURGICAL LIGHT HANDLE (MISCELLANEOUS) ×2 IMPLANT
CRADLE DONUT ADULT HEAD (MISCELLANEOUS) IMPLANT
DECANTER SPIKE VIAL GLASS SM (MISCELLANEOUS) IMPLANT
DRAPE PROXIMA HALF (DRAPES) IMPLANT
DRESSING NASAL POPE 10X1.5X2.5 (GAUZE/BANDAGES/DRESSINGS) ×2 IMPLANT
DRSG NASAL POPE 10X1.5X2.5 (GAUZE/BANDAGES/DRESSINGS) ×8
DRSG NASOPORE 8CM (GAUZE/BANDAGES/DRESSINGS) IMPLANT
DURASEAL SPINE SEALANT 3ML (MISCELLANEOUS) IMPLANT
ELECT COATED BLADE 2.86 ST (ELECTRODE) ×4 IMPLANT
ELECT REM PT RETURN 9FT ADLT (ELECTROSURGICAL) ×4
ELECT REM PT RETURN 9FT PED (ELECTROSURGICAL)
ELECTRODE REM PT RETRN 9FT PED (ELECTROSURGICAL) IMPLANT
ELECTRODE REM PT RTRN 9FT ADLT (ELECTROSURGICAL) ×3 IMPLANT
FILTER ARTHROSCOPY CONVERTOR (FILTER) ×8 IMPLANT
FLOSEAL 10ML (HEMOSTASIS) IMPLANT
GAUZE SPONGE 4X4 16PLY XRAY LF (GAUZE/BANDAGES/DRESSINGS) ×4 IMPLANT
GLOVE BIOGEL PI IND STRL 7.0 (GLOVE) ×1 IMPLANT
GLOVE BIOGEL PI INDICATOR 7.0 (GLOVE) ×1
GLOVE SURG SS PI 7.5 STRL IVOR (GLOVE) ×4 IMPLANT
GOWN STRL REUS W/ TWL LRG LVL3 (GOWN DISPOSABLE) ×6 IMPLANT
GOWN STRL REUS W/TWL LRG LVL3 (GOWN DISPOSABLE) ×8
KIT BASIN OR (CUSTOM PROCEDURE TRAY) ×4 IMPLANT
KIT ROOM TURNOVER OR (KITS) ×4 IMPLANT
NDL HYPO 25GX1X1/2 BEV (NEEDLE) ×2 IMPLANT
NDL SPNL 20GX3.5 QUINCKE YW (NEEDLE) ×2 IMPLANT
NEEDLE HYPO 25GX1X1/2 BEV (NEEDLE) ×4 IMPLANT
NEEDLE SPNL 20GX3.5 QUINCKE YW (NEEDLE) ×4 IMPLANT
NS IRRIG 1000ML POUR BTL (IV SOLUTION) ×4 IMPLANT
PACK SURGICAL SETUP 50X90 (CUSTOM PROCEDURE TRAY) ×4 IMPLANT
PAD ARMBOARD 7.5X6 YLW CONV (MISCELLANEOUS) ×8 IMPLANT
PAD ENT ADHESIVE 25PK (MISCELLANEOUS) ×4 IMPLANT
PATTIES SURGICAL .5 X3 (DISPOSABLE) ×4 IMPLANT
PENCIL BUTTON HOLSTER BLD 10FT (ELECTRODE) ×4 IMPLANT
SHEATH ENDOSCRUB 0 DEG (SHEATH) ×4 IMPLANT
SHEATH ENDOSCRUB 30 DEG (SHEATH) IMPLANT
SHEATH ENDOSCRUB 45 DEG (SHEATH) ×4 IMPLANT
SOLUTION ANTI FOG 6CC (MISCELLANEOUS) ×4 IMPLANT
SPECIMEN JAR SMALL (MISCELLANEOUS) ×8 IMPLANT
SPLINT NASAL DOYLE BI-VL (GAUZE/BANDAGES/DRESSINGS) IMPLANT
SPONGE TONSIL 1 RF SGL (DISPOSABLE) ×2 IMPLANT
STRIP CLOSURE SKIN 1/2X4 (GAUZE/BANDAGES/DRESSINGS) ×4 IMPLANT
SUT CHROMIC GUT 2 0 PS 2 27 (SUTURE) IMPLANT
SUT ETHIBOND 2 0 SH (SUTURE) IMPLANT
SUT ETHILON 3 0 PS 1 (SUTURE) ×4 IMPLANT
SUT PLAIN 4 0 ~~LOC~~ 1 (SUTURE) ×4 IMPLANT
SUT SILK 2 0 SH (SUTURE) ×8 IMPLANT
SWAB COLLECTION DEVICE MRSA (MISCELLANEOUS) ×2 IMPLANT
SYR 50ML SLIP (SYRINGE) IMPLANT
SYR BULB 3OZ (MISCELLANEOUS) ×4 IMPLANT
SYRINGE 10CC LL (SYRINGE) ×4 IMPLANT
TOWEL OR 17X24 6PK STRL BLUE (TOWEL DISPOSABLE) ×8 IMPLANT
TRACKER ENT INSTRUMENT (MISCELLANEOUS) ×4 IMPLANT
TRACKER ENT PATIENT (MISCELLANEOUS) ×4 IMPLANT
TRAY ENT MC OR (CUSTOM PROCEDURE TRAY) ×4 IMPLANT
TUBE ANAEROBIC SPECIMEN COL (MISCELLANEOUS) ×2 IMPLANT
TUBE CONNECTING 12X1/4 (SUCTIONS) ×4 IMPLANT
TUBE SALEM SUMP 12R W/ARV (TUBING) IMPLANT
TUBING STRAIGHTSHOT EPS 5PK (TUBING) ×4 IMPLANT
WATER STERILE IRR 1000ML POUR (IV SOLUTION) IMPLANT
WIPE INSTRUMENT VISIWIPE 73X73 (MISCELLANEOUS) ×4 IMPLANT
YANKAUER SUCT BULB TIP NO VENT (SUCTIONS) ×4 IMPLANT

## 2015-04-28 NOTE — Discharge Instructions (Signed)
Follow up in one week, no nose blowing x 2 weeks, Rx for zpack, hydrocodone, and zofran on chart.

## 2015-04-28 NOTE — Op Note (Signed)
2:40 PM  04/28/2015  Surgeon: Melvenia BeamGore, Adali Pennings, MD  Procedures Performed: [phone] [id number] image guidance 30520-endoscopic septoplasty [phone] bilateral total ethmoidectomies [phone] bilateral maxillary antrostomies 31276-50 bilateral Draf 2A frontal sinusotomies [id number] bilateral sphenoidotomies  Operative Findings: bilateral supraorbital ethmoid cells communicated with the true frontal sinuses. No skull base injury or CSF leak BL. No vascular injury BL. No orbital, globe, or lamina papyrecea injury BL. Right maxillary sinus silent sinus syndrome with bilateral copious maxillary sinus purulence and bilateral chronic sinusitis.  Specimens: right and left sinus contents.  PREOPERATIVE DIAGNOSIS: Chronic sinusitis and septal deviation.   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     ANESTHESIA: General endotracheal.  ESTIMATED BLOOD LOSS: less than 100 mL.  HISTORY OF PRESENT ILLNESS: The patient is a  74yo female who has had chronic sinusitis rightward septal deviation for at least 2 years refractory to maximal medical treatment.  PROCEDURE: The patient was brought to the operating room and placed in the supine position. After adequate endotracheal anesthesia was obtained, the skin was draped in sterile fashion. Lidocaine 1% with 1:100,000 epinephrine was injected into the bilateral greater palatine foramina transorally and the septum transnasally. Approximately 10 cc total was used. The CT imahe guidance hardware was placed on the patient and she was registered to the medtronic image guidance system and the image guidance was used throughout the case to identify the maxillary, ethmoid, frontal, and sphenoid ostia, lamina papyracea, skull base, etc.  Septoplasty was performed using the 0 degree endoscope by making a right anterior Killian incision, elevating a right submucous flap back to the sphenoid rostrum using the Cottle,  and then making a vertical cartilaginous incision with the 15 blade. I then elevated a left sided submucous flap with the Cottle, and then used the takahashi forceps to remove the deviated portions of the septal cartilage and bone, correcting the severe rightward septal deviation to a nicely midline septum. I then sewed the septal flaps with a 4-0 plain gut whip stitch.   Attention then was directed toward the left sinuses. Lidocaine 1% with 1:100,000 epinephrine was injected in the region of the anterior portion of the left middle turbinate and uncinate process. The uncinate process was identified using the 0 degree endoscope and the left uncinate process was  removed systematically superiorly to inferiorly with back-biting forceps. Next, the left maxillary sinus was identified and the medial wall of the left maxillary sinus was widely opened using the microdebrider. There was copious left maxillary purulence and mucous that was cultured and irrigated out/suctioned. I then switched to the 45 degree scope and inspected the maxillary sinus. The left maxillary natural ostium was widely opened using the backbiter.  The anterior and posterior ethmoid air cells were entered after identifying the left ethmoid bulla using the image guidance suction and dissected up to the skull base and out to the lamina papyrecea using the 55 degree curette and the 3mm Kerrison punch. All of the ethmoid cells were meticulously dissected out using the Kerrison and debrider. The skull base and lamina papyracea were preserved throughout.   The left sphenoid natural ostium was then identified using the image guidance suction and the left sphenoid was widely opened using the 3mm Kerrison all the way to the skull base and lamina papyracea. The sphenoid was widely opened using the 3mm Kerrison.  Next I switched back to the 45 degree endoscope, 60 degree microdebrider, and curved image guidance suction. The skull base was identified and  dissected anteriorly using the 90 degree Kuhn-Bolger curette. The Agger Nasi cell was taken down and then the left frontal sinus was widely opened using the curved debrider, Stammberger 4mm frontal mushroom punch, and the 90 degree curette and the left supraorbital ethmoid cells were also communicated with the true frontal. The left middle turbinate was then Bolgerized to the septum in the standard fashion.  Attention then was directed toward the right sinuses. Lidocaine 1% with 1:100,000 epinephrine was injected in the region of the anterior portion of the right middle turbinate and uncinate process. The uncinate process was identified using the 0 degree endoscope and the right uncinate process was removed systematically superiorly to inferiorly with back-biting forceps. Next, the right maxillary sinus was identified and the medial wall of the right maxillary sinus was widely opened using the microdebrider. Of note the right maxillary sinus was atelectatic and very small consistent with right maxillary silent sinus syndrome, and there was copious right maxillary purulence and mucous that was cultured and irrigated out/suctioned. I then switched to the 45 degree scope and inspected the right  maxillary sinus. The right maxillary natural ostium was widely opened using the backbiter.  The anterior and posterior right ethmoid air cells were entered after identifying the right ethmoid bulla using the image guidance suction and dissected up to the skull base and out to the lamina papyrecea using the 55 degree curette and the 3mm Kerrison punch. All of the ethmoid cells were meticulously dissected out using the Kerrison and debrider. The skull base and lamina papyracea were preserved throughout.   The right sphenoid natural ostium was then identified using the image guidance suction and the right sphenoid was widely opened using the 3mm Kerrison all the way to the skull base and lamina papyracea. The sphenoid was  widely opened using the 3mm Kerrison.  Next I switched back to the 45 degree endoscope, 60 degree microdebrider, and curved image guidance suction. The skull base was identified and dissected anteriorly using the 90 degree Kuhn-Bolger curette. The Agger Nasi cell was taken down and then the right frontal sinus was widely opened using the curved debrider, Stammberger 4mm frontal mushroom punch, and the 90 degree curette and the right supraorbital ethmoid cells were also communicated with the true frontal. The right middle turbinate was then Bolgerized to the septum in the standard fashion.  A thorough irrigation was then carried out in the nasal cavity, and bilateral 8cm fingercot spacers were placed in the  middle meatal cavities and secured to the patient's cheeks. The patient's stomach was suctioned out using a flexible OGtube. The patient was awakened from anesthesia and extubated without difficulty. The patient tolerated the procedure well and returned to the recovery room in stable condition.   Dr. Melvenia Beam was present and performed the entire procedure. 2:40 PM  04/28/2015 Melvenia Beam

## 2015-04-28 NOTE — Anesthesia Postprocedure Evaluation (Signed)
Anesthesia Post Note  Patient: Debbie [name]  Procedure(s) Performed: Procedure(s) (LRB): ENDOSCOPIC SINUS SURGERY WITH NAVIGATION (Bilateral) SEPTOPLASTY (N/A)  Patient location during evaluation: PACU Anesthesia Type: General Level of consciousness: awake and alert Pain management: pain level controlled Vital Signs Assessment: post-procedure vital signs reviewed and stable Respiratory status: spontaneous breathing, nonlabored ventilation, respiratory function stable and patient connected to nasal cannula oxygen Cardiovascular status: blood pressure returned to baseline and stable Postop Assessment: no signs of nausea or vomiting Anesthetic complications: no    Last Vitals:  Filed Vitals:   04/28/15 1545 04/28/15 1552  BP:    Pulse: 81 79  Temp:    Resp: 18 18    Last Pain:  Filed Vitals:   04/28/15 1557  PainSc: 0-No pain                 Dillion Stowers DAVID

## 2015-04-28 NOTE — Transfer of Care (Signed)
Immediate Anesthesia Transfer of Care Note  Patient: Debbie [name]  Procedure(s) Performed: Procedure(s): ENDOSCOPIC SINUS SURGERY WITH NAVIGATION (Bilateral) SEPTOPLASTY (N/A)  Patient Location: PACU  Anesthesia Type:General  Level of Consciousness: awake, oriented, sedated, patient cooperative and responds to stimulation  Airway & Oxygen Therapy: Patient Spontanous Breathing and Patient connected to face mask oxygen  Post-op Assessment: Report given to RN, Post -op Vital signs reviewed and stable, Patient moving all extremities and Patient moving all extremities X 4  Post vital signs: Reviewed and stable  Last Vitals:  Filed Vitals:   04/28/15 1021  BP: 166/67  Pulse: 51  Temp: 36.8 C  Resp: 18    Complications: No apparent anesthesia complications

## 2015-04-28 NOTE — Anesthesia Procedure Notes (Signed)
Procedure Name: Intubation Date/Time: 04/28/2015 12:18 PM Performed by: Wray KearnsFOLEY, Collene Massimino A Pre-anesthesia Checklist: Patient identified, Emergency Drugs available, Suction available, Patient being monitored and Timeout performed Patient Re-evaluated:Patient Re-evaluated prior to inductionOxygen Delivery Method: Circle system utilized Preoxygenation: Pre-oxygenation with 100% oxygen Intubation Type: IV induction Ventilation: Mask ventilation without difficulty Laryngoscope Size: Miller and 2 Grade View: Grade I Tube type: Oral Tube size: 7.0 mm Number of attempts: 1 Airway Equipment and Method: Stylet Placement Confirmation: ETT inserted through vocal cords under direct vision,  positive ETCO2 and breath sounds checked- equal and bilateral Secured at: 22 cm Tube secured with: Tape Dental Injury: Teeth and Oropharynx as per pre-operative assessment

## 2015-04-28 NOTE — Anesthesia Preprocedure Evaluation (Signed)
Anesthesia Evaluation  Patient identified by MRN, date of birth, ID band Patient awake    Reviewed: Allergy & Precautions, NPO status , Patient's Chart, lab work & pertinent test results  Airway Mallampati: I  TM Distance: >3 FB Neck ROM: Full    Dental   Pulmonary COPD,    Pulmonary exam normal        Cardiovascular hypertension, Pt. on medications + CAD  Normal cardiovascular exam     Neuro/Psych Anxiety    GI/Hepatic GERD  Medicated and Controlled,  Endo/Other    Renal/GU      Musculoskeletal   Abdominal   Peds  Hematology   Anesthesia Other Findings   Reproductive/Obstetrics                             Anesthesia Physical Anesthesia Plan  ASA: II  Anesthesia Plan: General   Post-op Pain Management:    Induction: Intravenous  Airway Management Planned: Oral ETT  Additional Equipment:   Intra-op Plan:   Post-operative Plan: Extubation in OR  Informed Consent: I have reviewed the patients History and Physical, chart, labs and discussed the procedure including the risks, benefits and alternatives for the proposed anesthesia with the patient or authorized representative who has indicated his/her understanding and acceptance.     Plan Discussed with: CRNA and Surgeon  Anesthesia Plan Comments:         Anesthesia Quick Evaluation

## 2015-05-01 ENCOUNTER — Encounter (HOSPITAL_COMMUNITY): Payer: Self-pay | Admitting: Otolaryngology

## 2015-05-01 LAB — CULTURE, ROUTINE-SINUS
Culture: NO GROWTH
Culture: NO GROWTH

## 2015-05-03 LAB — ANAEROBIC CULTURE
Gram Stain: NONE SEEN
Gram Stain: NONE SEEN
Gram Stain: NONE SEEN

## 2015-05-21 IMAGING — DX DG CHEST 2V
2 series · 2 of 2 positions shown · non-contrast
Comparison: Chest radiograph July 29, 2014 and chest CT July 30, 2014

CLINICAL DATA: One day history of chest tightness

EXAM:
CHEST  2 VIEW

[chest pa]
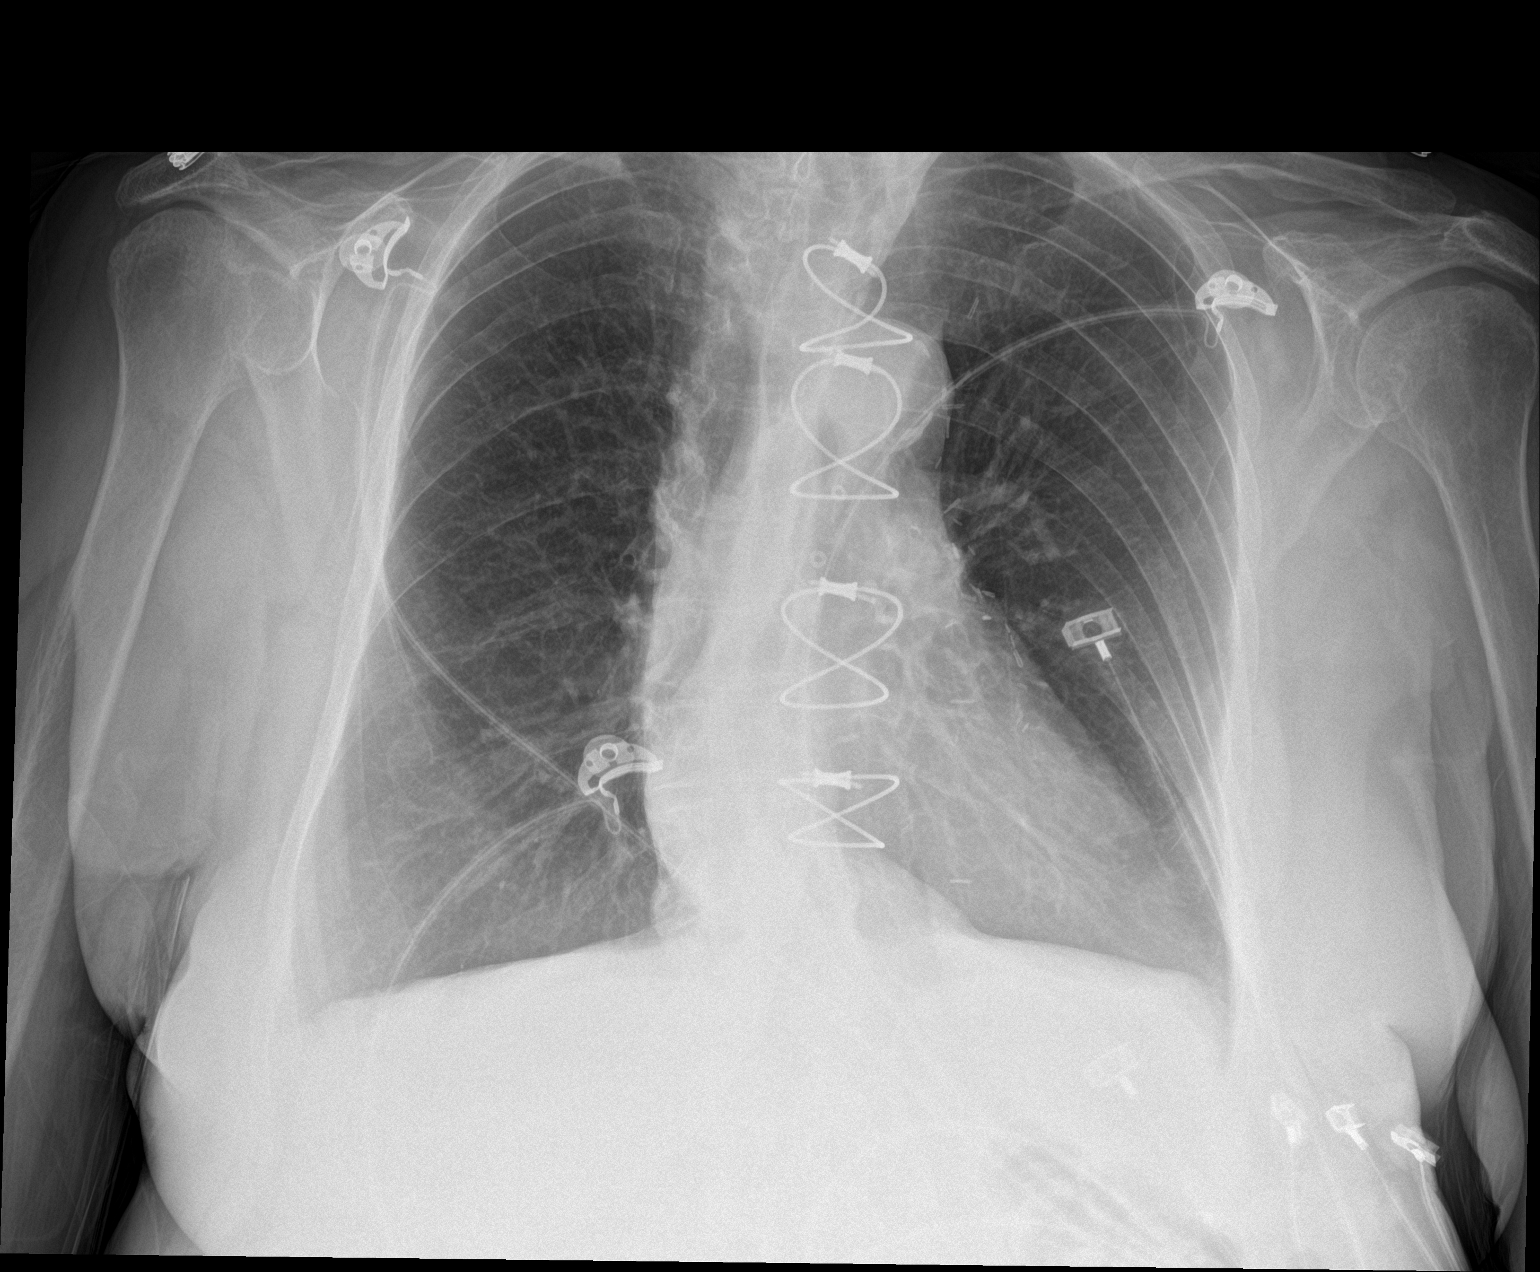

[chest lat]
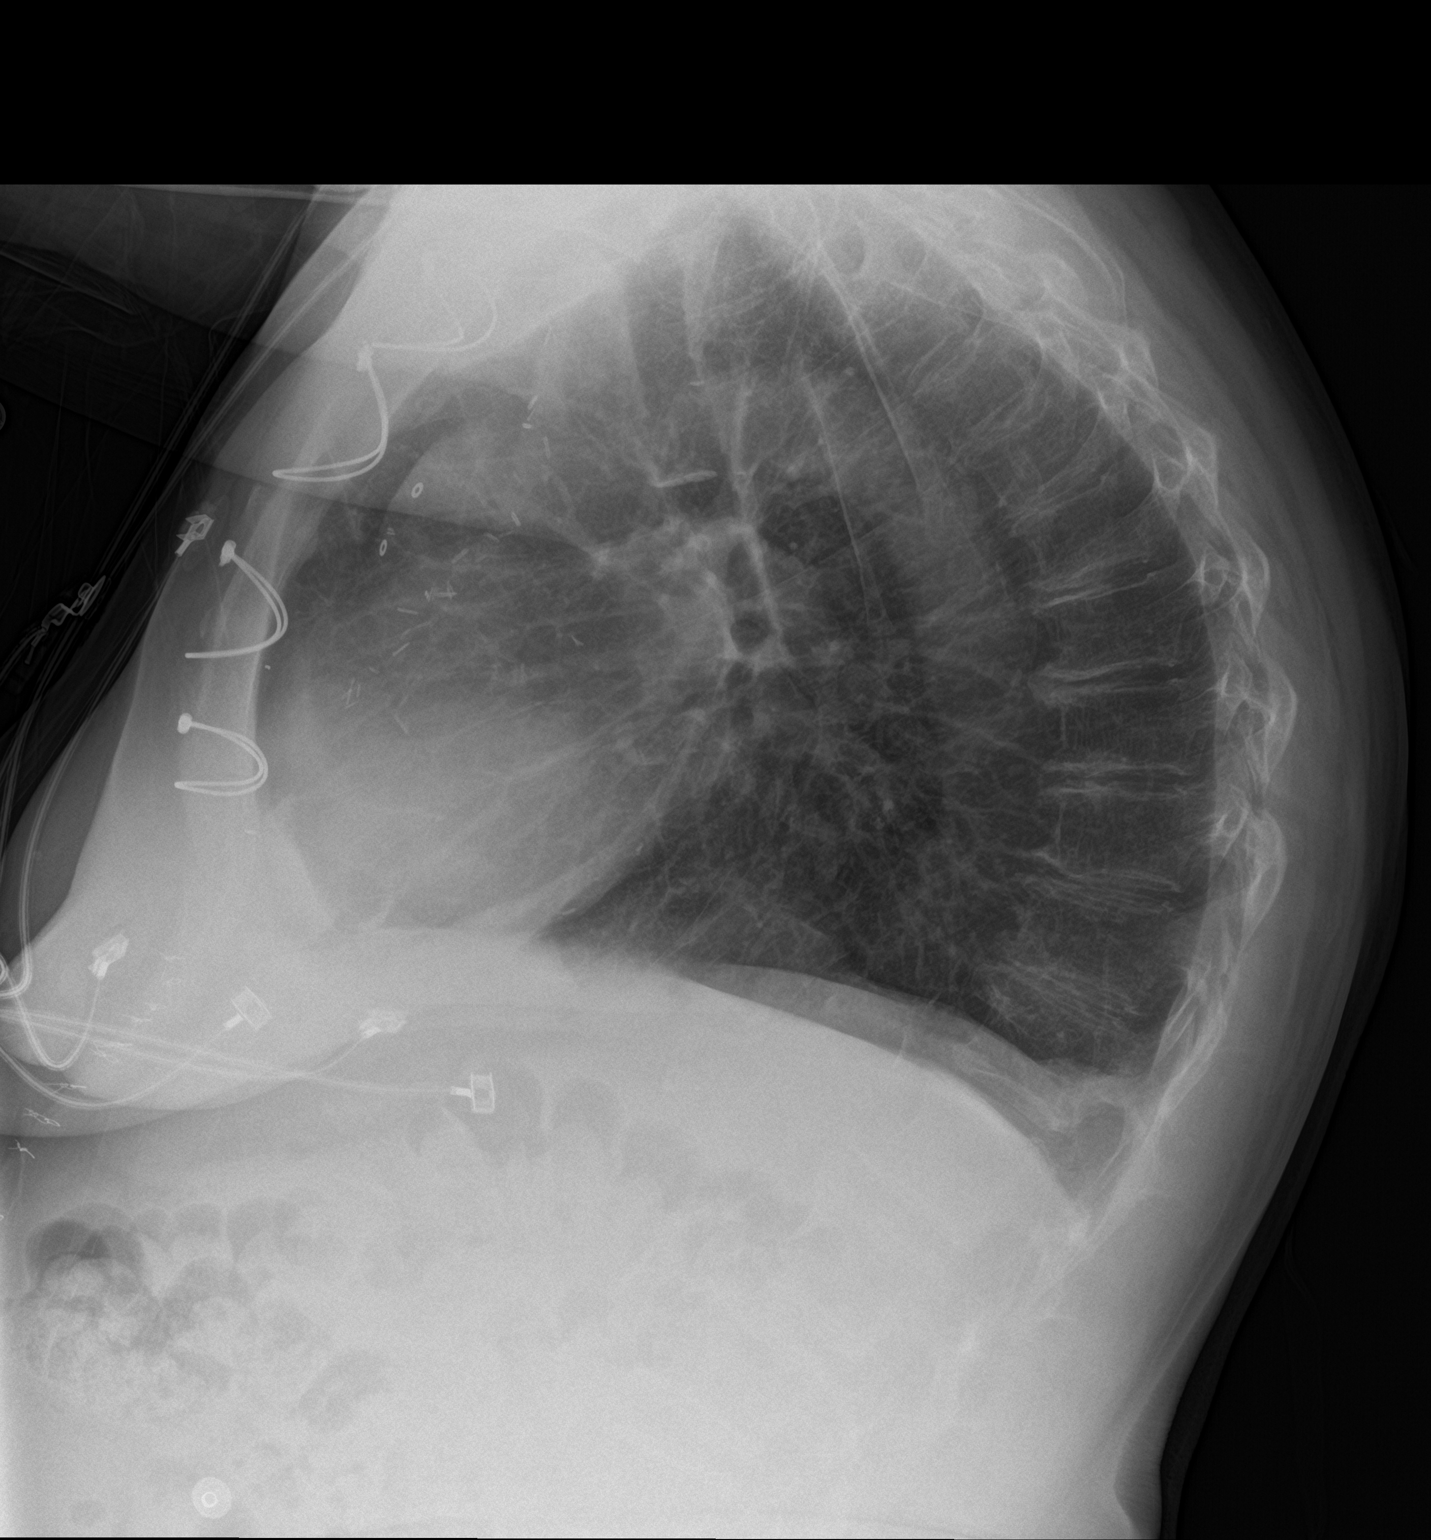

[2 of 2 positions shown; findings below may reference images not displayed]

FINDINGS: There is underlying emphysematous change. There is no edema or
consolidation. The heart size is normal. The pulmonary vascularity
reflects underlying emphysema. Patient is status post coronary
artery bypass grafting. No adenopathy. There is degenerative change
in the thoracic spine.
IMPRESSION: Underlying emphysema.  No edema or consolidation.

## 2015-08-24 ENCOUNTER — Other Ambulatory Visit: Payer: Self-pay

## 2015-10-17 ENCOUNTER — Ambulatory Visit (INDEPENDENT_AMBULATORY_CARE_PROVIDER_SITE_OTHER): Payer: Medicare Other | Admitting: Orthopedic Surgery

## 2015-10-17 ENCOUNTER — Ambulatory Visit (INDEPENDENT_AMBULATORY_CARE_PROVIDER_SITE_OTHER): Payer: Medicare Other

## 2015-10-17 VITALS — BP 143/83 | HR 67 | Ht 63.5 in | Wt 132.0 lb

## 2015-10-17 DIAGNOSIS — M5441 Lumbago with sciatica, right side: Secondary | ICD-10-CM

## 2015-10-17 DIAGNOSIS — M25551 Pain in right hip: Secondary | ICD-10-CM

## 2015-10-17 DIAGNOSIS — M1990 Unspecified osteoarthritis, unspecified site: Secondary | ICD-10-CM | POA: Diagnosis not present

## 2015-10-17 DIAGNOSIS — M1610 Unilateral primary osteoarthritis, unspecified hip: Secondary | ICD-10-CM

## 2015-10-17 MED ORDER — PREDNISONE 5 MG (48) PO TBPK: ORAL_TABLET | ORAL | Status: DC

## 2015-10-17 MED ORDER — GABAPENTIN 100 MG PO CAPS
100.0000 mg | ORAL_CAPSULE | Freq: Three times a day (TID) | ORAL | Status: DC
Start: 2015-10-17 — End: 2015-12-18

## 2015-10-17 NOTE — Progress Notes (Signed)
Patient ID: Debbie [name], female   DOB: August 08, 1940, 75 y.o.   MRN: [id number]  Chief Complaint  Patient presents with  . Hip Pain    Right hip pain    HPI Debbie [name] is a 75 y.o. female.  C/o back pain right hip pain with 3 episodes of numbness in the right leg associated with a weakness in the right lower extremity and a catching sensation in the back x ~1 month. No prior treatment   Review of Systems Review of Systems  Constitutional: Negative for fever and fatigue.  Respiratory: Positive for shortness of breath.   Cardiovascular: Positive for palpitations.  Gastrointestinal: Positive for constipation.  Genitourinary: Positive for frequency.  Musculoskeletal: Positive for back pain, joint swelling and arthralgias.  Allergic/Immunologic: Positive for environmental allergies.  All other systems reviewed and are negative.   Past Medical History  Diagnosis Date  . COPD (chronic obstructive pulmonary disease) (HCC)   . Essential hypertension   . Hypothyroidism   . GERD (gastroesophageal reflux disease)   . Allergic rhinitis   . Anxiety   . Hyperlipidemia   . CAD (coronary artery disease)     CABG 2006 [facility]  . Carotid artery disease (HCC)   . Arthritis   . Osteoporosis   . Bladder spasms   . Cystocele   . Vaginal vault prolapse   . Hypothyroidism   . Renal insufficiency   . Collagen vascular disease (HCC)   . Asthma     Past Surgical History  Procedure Laterality Date  . Cornea laceration repair    . Colonoscopy    . Hernia repair    . Cataracts      REMOVED  . Coronary artery bypass graft      X 5 VESSELS  . Tubal ligation    . Cystoscopy N/A 04/06/2013    Procedure: CYSTOSCOPY;  Surgeon: Martina SinnerScott A MacDiarmid, MD;  Location: [facility]S;  Service: Urology;  Laterality: N/A;  . Anterior and posterior repair N/A 04/06/2013    Procedure: CYSTOCELE REPAIR ;  Surgeon: Martina SinnerScott A MacDiarmid, MD;  Location: [facility];  Service: Urology;  Laterality: N/A;   . Sinus endo w/fusion Bilateral 04/28/2015    Procedure: ENDOSCOPIC SINUS SURGERY WITH NAVIGATION;  Surgeon: Melvenia BeamMitchell Gore, MD;  [facility];  Service: ENT;  Laterality: Bilateral;  . Septoplasty N/A 04/28/2015    Procedure: SEPTOPLASTY;  Surgeon: Melvenia BeamMitchell Gore, MD;  [facility];  Service: ENT;  Laterality: N/A;    Family History  Problem Relation Age of Onset  . Cirrhosis Father   . Lung disease Father   . Diabetes Mellitus II Mother   . Heart Problems Mother    was reviewed  Social History Social History  Substance Use Topics  . Smoking status: Never Smoker   . Smokeless tobacco: Never Used  . Alcohol Use: 0.0 oz/week    0 Standard drinks or equivalent per week     Comment: 1 drink 1 time a year    Allergies  Allergen Reactions  . Ivp Dye [Iodinated Diagnostic Agents] Swelling    Kidney Dye  . Betadine [Povidone Iodine] Rash  . Codeine Nausea And Vomiting  . Other Other (See Comments)    ALL NARCOTICS  . Penicillins Rash  . Sulfa Antibiotics Rash    Current Outpatient Prescriptions  Medication Sig Dispense Refill  . ALPRAZolam (XANAX) 0.25 MG tablet Take 0.25 mg by mouth 2 (two) times daily as needed for anxiety.    .[name]  aspirin 81 MG tablet Take 81 mg by mouth daily.    [name] atorvastatin (LIPITOR) 80 MG tablet Take 80 mg by mouth at bedtime.    . Biotin 5000 MCG CAPS Take by mouth.    . cetirizine (ZYRTEC) 10 MG tablet Take 10 mg by mouth daily.    [name] esomeprazole (NEXIUM) 20 MG capsule Take x 2 Take 30-60 min before first meal of the day (Patient taking differently: Take 40 mg by mouth daily before breakfast. Take x 2 Take 30-60 min before first meal of the day)    . famotidine (PEPCID) 20 MG tablet One at bedtime (Patient taking differently: Take 20 mg by mouth at bedtime. One at bedtime) 30 tablet 2  . fluticasone (FLONASE) 50 MCG/ACT nasal spray Place 2 sprays into the nose daily.    . furosemide (LASIX) 40 MG tablet Take 40 mg by mouth every morning.     [name]  guaiFENesin (MUCINEX) 600 MG 12 hr tablet Take 1,200 mg by mouth 2 (two) times daily as needed for congestion.    [name] ibuprofen (ADVIL,MOTRIN) 200 MG tablet Take 200 mg by mouth every 6 (six) hours as needed.    . isosorbide dinitrate (ISORDIL) 30 MG tablet Take 30 mg by mouth 4 (four) times daily.    . isosorbide mononitrate (IMDUR) 30 MG 24 hr tablet Take 30 mg by mouth daily.    [name] levothyroxine (SYNTHROID, LEVOTHROID) 112 MCG tablet Take 112 mcg by mouth daily before breakfast.    . montelukast (SINGULAIR) 10 MG tablet Take 10 mg by mouth at bedtime.    . nitroGLYCERIN (NITROSTAT) 0.4 MG SL tablet Place 0.4 mg under the tongue every 5 (five) minutes as needed for chest pain.    . polyvinyl alcohol (LIQUIFILM TEARS) 1.4 % ophthalmic solution Place 2 drops into both eyes as needed for dry eyes (3-4 times weekly).    . potassium chloride (MICRO-K) 10 MEQ CR capsule Take 30 mEq by mouth 2 (two) times daily.      No current facility-administered medications for this visit.       Physical Exam Physical Exam  Constitutional: She is oriented to person, place, and time. She appears well-developed and well-nourished. No distress.  Cardiovascular: Normal rate and intact distal pulses.   Neurological: She is alert and oriented to person, place, and time. She exhibits normal muscle tone. Coordination normal.  Her dtrs sre 0 ankle and knee RT AND LEFT LEG   Skin: Skin is warm and dry. No rash noted. She is not diaphoretic. No erythema. No pallor.  Psychiatric: She has a normal mood and affect. Her behavior is normal. Judgment and thought content normal.    Right Hip Exam   Tenderness  The patient is experiencing no tenderness.     Range of Motion  The patient has normal right hip ROM.  Muscle Strength  The patient has normal right hip strength.  Other  Erythema: absent Scars: absent Sensation: decreased Pulse: present  Comments:  L5 dermatome sensory decrease   Left Hip Exam    Tenderness  The patient is experiencing no tenderness.     Range of Motion  The patient has normal left hip ROM.  Muscle Strength  The patient has normal left hip strength.   Other  Erythema: absent Scars: absent Sensation: normal Pulse: present   Back Exam   Tenderness  The patient is experiencing tenderness in the lumbar.  Tests  Straight leg raise right: positive Straight leg raise  left: negative  Reflexes  Patellar: 0/4 Achilles: 0/4 Babinski's sign: normal   Other  Toe Walk: normal Heel Walk: normal Gait: normal  Erythema: no back redness Scars: absent       Neurologic examination  Reflexes were 2+ and equal  Sensation was normal in both feet and legs  Babinski's tests were down going  Straight leg raise testing was normal bilaterally  The vascular examination revealed normal dorsalis pedis pulses in both feet and both feet were warm with good capillary refill   Data Reviewed I ordered and read the xrays  xrays pelvis show mild arthritis right hip   And L spine film shows osteopenia , excessive lordosis L5-S1 disc space narrowing and facet arthritis  Assessment  Encounter Diagnoses  Name Primary?  . Right-sided low back pain with right-sided sciatica Yes  . Right hip pain   . Arthritis of hip       Plan  Meds ordered this encounter  Medications  . Biotin 5000 MCG CAPS    Sig: Take by mouth.  [name] ibuprofen (ADVIL,MOTRIN) 200 MG tablet    Sig: Take 200 mg by mouth every 6 (six) hours as needed.  . isosorbide dinitrate (ISORDIL) 30 MG tablet    Sig: Take 30 mg by mouth 4 (four) times daily.  [name] gabapentin (NEURONTIN) 100 MG capsule    Sig: Take 1 capsule (100 mg total) by mouth 3 (three) times daily.    Dispense:  90 capsule    Refill:  0  . predniSONE (STERAPRED UNI-PAK 48 TAB) 5 MG (48) TBPK tablet    Sig: Use as directed    Dispense:  48 tablet    Refill:  0   Return in 3 weeks

## 2015-10-25 ENCOUNTER — Encounter: Payer: Medicare Other | Admitting: Cardiology

## 2015-10-25 ENCOUNTER — Encounter: Payer: Self-pay | Admitting: Cardiology

## 2015-10-25 NOTE — Progress Notes (Signed)
No show  This encounter was created in error - please disregard.

## 2015-11-01 ENCOUNTER — Other Ambulatory Visit: Payer: Self-pay | Admitting: Cardiology

## 2015-11-01 DIAGNOSIS — I6523 Occlusion and stenosis of bilateral carotid arteries: Secondary | ICD-10-CM

## 2015-11-07 ENCOUNTER — Ambulatory Visit: Payer: Medicare Other | Admitting: Orthopedic Surgery

## 2015-11-09 ENCOUNTER — Ambulatory Visit: Payer: Medicare Other | Admitting: Cardiology

## 2015-11-13 ENCOUNTER — Other Ambulatory Visit: Payer: Self-pay | Admitting: Internal Medicine

## 2015-11-15 ENCOUNTER — Other Ambulatory Visit: Payer: Self-pay | Admitting: Internal Medicine

## 2015-11-16 ENCOUNTER — Ambulatory Visit (INDEPENDENT_AMBULATORY_CARE_PROVIDER_SITE_OTHER): Payer: Medicare Other | Admitting: Orthopedic Surgery

## 2015-11-16 ENCOUNTER — Encounter: Payer: Self-pay | Admitting: Orthopedic Surgery

## 2015-11-16 ENCOUNTER — Ambulatory Visit: Payer: Medicare Other

## 2015-11-16 VITALS — BP 167/74 | HR 69 | Ht 62.5 in | Wt 137.4 lb

## 2015-11-16 DIAGNOSIS — I6523 Occlusion and stenosis of bilateral carotid arteries: Secondary | ICD-10-CM | POA: Diagnosis not present

## 2015-11-16 DIAGNOSIS — M4806 Spinal stenosis, lumbar region: Secondary | ICD-10-CM

## 2015-11-16 DIAGNOSIS — M48061 Spinal stenosis, lumbar region without neurogenic claudication: Secondary | ICD-10-CM

## 2015-11-16 LAB — VAS US CAROTID
LEFT ECA DIAS: -16 cm/s
LEFT VERTEBRAL DIAS: -14 cm/s
Left CCA dist dias: -22 cm/s
Left CCA dist sys: -78 cm/s
Left CCA prox dias: 21 cm/s
Left CCA prox sys: 86 cm/s
Left ICA dist dias: -30 cm/s
Left ICA dist sys: -85 cm/s
Left ICA prox dias: -27 cm/s
Left ICA prox sys: -78 cm/s
RIGHT ECA DIAS: -11 cm/s
RIGHT VERTEBRAL DIAS: -34 cm/s
Right CCA prox dias: -21 cm/s
Right CCA prox sys: -88 cm/s
Right cca dist sys: -81 cm/s

## 2015-11-16 NOTE — Patient Instructions (Signed)
WE WILL SCHEDULE MRI FOR YOU AND CALL YOU WITH APPT 

## 2015-11-16 NOTE — Progress Notes (Signed)
Patient ID: Debbie [name], female   DOB: 31-Mar-1941, 75 y.o.   MRN: [id number]  Chief Complaint  Patient presents with  . Follow-up    right hip pain    HPI- Chief Complaint   Patient presents with   .  Hip Pain       Right hip pain     HPI Debbie [name] is a 75 y.o. female.  C/o back pain right hip pain with 3 episodes of numbness in the right leg associated with a weakness in the right lower extremity and a catching sensation in the back x ~1 month.  I treated her with the following   Meds ordered this encounter   Medications   .         .  ibuprofen (ADVIL,MOTRIN) 200 MG tablet       Sig: Take 200 mg by mouth every 6 (six) hours as needed.   .  isosorbide dinitrate (ISORDIL) 30 MG tablet       Sig: Take 30 mg by mouth 4 (four) times daily.   [name].  gabapentin (NEURONTIN) 100 MG capsule       Sig: Take 1 capsule (100 mg total) by mouth 3 (three) times daily.       Dispense:  90 capsule       Refill:  0   .  predniSONE (STERAPRED UNI-PAK 48 TAB) 5 MG (48) TBPK tablet       Sig: Use as directed       Dispense:  48 tablet       Refill:  0    She's now had 7 weeks of symptoms without improvement even with gabapentin prednisone and ibuprofen    Review of Systems Review of Systems  Constitutional: Negative for fever and fatigue.  Respiratory: Positive for shortness of breath.   Cardiovascular: Positive for palpitations.  Gastrointestinal: Positive for constipation.  Genitourinary: Positive for frequency.  Musculoskeletal: Positive for back pain, joint swelling and arthralgias.  Allergic/Immunologic: Positive for environmental allergies.  All other systems reviewed and are negative.     Past Medical History   Diagnosis  Date   .  COPD (chronic obstructive pulmonary disease) (HCC)     .  Essential hypertension     .  Hypothyroidism     .  GERD (gastroesophageal reflux disease)     .  Allergic rhinitis     .  Anxiety     .  Hyperlipidemia     .  CAD  (coronary artery disease)         [facility] [place]   .  Carotid artery disease (HCC)     .  Arthritis     .  Osteoporosis     .  Bladder spasms     .  Cystocele     .  Vaginal vault prolapse     .  Hypothyroidism     .  Renal insufficiency     .  Collagen vascular disease (HCC)     .  Asthma        ROS  Past Medical History  Diagnosis Date  . COPD (chronic obstructive pulmonary disease) (HCC)   . Essential hypertension   . Hypothyroidism   . GERD (gastroesophageal reflux disease)   . Allergic rhinitis   . Anxiety   . Hyperlipidemia   . CAD (coronary artery disease)     [facility] [place]  . Carotid artery disease (HCC)   .  Arthritis   . Osteoporosis   . Bladder spasms   . Cystocele   . Vaginal vault prolapse   . Hypothyroidism   . Renal insufficiency   . Collagen vascular disease (HCC)   . Asthma     BP 167/74 mmHg  Pulse 69  Ht 5' 2.5" (1.588 m)  Wt 137 lb 6.4 oz (62.324 kg)  BMI 24.71 kg/m2  Physical Exam Physical Exam  Constitutional: The patient appears well-developed and well-nourished. No distress.  The patient is oriented to person, place, and time.  Psychiatric: The patient has a normal mood and affect.  Cardiovascular: Intact distal pulses.   Neurological: sensation is normal  Skin: Skin is warm and dry. No rash noted. The patient is not diaphoretic. No erythema. No pallor.    Ortho Exam She is ambulatory she does not use assistive devices at this time. Her lower extremities have normal strength. We do note normal flexion in both right and left hip in both hips are stable. Leg alignment is normal in each leg and leg lengths are equal.  Spinal exam skin shows no caf au lait spots or hairy patches. She has tenderness in her lower back L4-5 L5-S1. With provocative maneuvers she has increased pain rating down the left leg at 45. .   ASSESSMENT AND PLAN   Recommend MRI L-spine spinal stenosis versus lumbar disc  Treatment  going forward will need epidural injection need an MRI to image for injection.  Fuller CanadaStanley Harrison, MD 11/16/2015 4:45 PM

## 2015-11-23 ENCOUNTER — Ambulatory Visit: Payer: Medicare Other | Admitting: Cardiology

## 2015-11-24 ENCOUNTER — Ambulatory Visit (HOSPITAL_COMMUNITY): Admission: RE | Admit: 2015-11-24 | Payer: Medicare Other | Source: Ambulatory Visit

## 2015-11-27 ENCOUNTER — Telehealth: Payer: Self-pay | Admitting: *Deleted

## 2015-11-27 NOTE — Telephone Encounter (Signed)
-----   Message from Jonelle SidleSamuel G McDowell, MD sent at 11/16/2015  9:31 PM EDT ----- Results reviewed. Stable 1-39% bilateral ICA stenoses. A copy of this test should be forwarded to Ernestine ConradBLUTH, KIRK, MD.

## 2015-11-27 NOTE — Telephone Encounter (Signed)
Pt aware, routed to pcp 

## 2015-12-05 ENCOUNTER — Ambulatory Visit (HOSPITAL_COMMUNITY)
Admission: RE | Admit: 2015-12-05 | Discharge: 2015-12-05 | Disposition: A | Discharge status: Home / Self Care | Payer: Medicare Other | Source: Ambulatory Visit | Attending: Orthopedic Surgery | Admitting: Orthopedic Surgery

## 2015-12-05 DIAGNOSIS — M48061 Spinal stenosis, lumbar region without neurogenic claudication: Secondary | ICD-10-CM

## 2015-12-05 DIAGNOSIS — M4608 Spinal enthesopathy, sacral and sacrococcygeal region: Secondary | ICD-10-CM | POA: Insufficient documentation

## 2015-12-05 DIAGNOSIS — M5126 Other intervertebral disc displacement, lumbar region: Secondary | ICD-10-CM | POA: Insufficient documentation

## 2015-12-14 NOTE — Progress Notes (Signed)
Cardiology Office Note  Date: 12/15/2015   ID: [name], DOB [birth year], MRN [id number]  PCP: Ernestine Conrad, MD  Primary Cardiologist: [name], MD   Chief Complaint  Patient presents with  . Coronary Artery Disease    History of Present Illness: Debbie [name] is a 75 y.o. female last seen in November 2016. She presents for a routine follow-up visit. Since last evaluation she has had no significant angina symptoms or required nitroglycerin. She walks approximately 2 miles each day near her home for exercise. She reports NYHA class II dyspnea.  I reviewed her cardiac medications which are outlined below. No changes from a cardiac perspective. She continues on aspirin, Lipitor, Lasix, potassium supplements, Imdur, and as needed nitroglycerin.  She reports having had recent lab work with Dr. Loney Hering, we are requesting the results for review.  Most recent cardiac structural and ischemic testing from last year is outlined below. She had recent carotid Dopplers that showed only mild atherosclerotic disease.  Past Medical History:  Diagnosis Date  . Allergic rhinitis   . Anxiety   . Arthritis   . Asthma   . Bladder spasms   . CAD (coronary artery disease)    CABG 2006 [facility]  . Carotid artery disease (HCC)   . Collagen vascular disease (HCC)   . COPD (chronic obstructive pulmonary disease) (HCC)   . Cystocele   . Essential hypertension   . GERD (gastroesophageal reflux disease)   . Hyperlipidemia   . Hypothyroidism   . Hypothyroidism   . Osteoporosis   . Renal insufficiency   . Vaginal vault prolapse     Past Surgical History:  Procedure Laterality Date  . ANTERIOR AND POSTERIOR REPAIR N/A 04/06/2013   Procedure: CYSTOCELE REPAIR ;  Surgeon: Martina Sinner, MD;  Location: [facility] ORS;  Service: Urology;  Laterality: N/A;  . CATARACTS     REMOVED  . COLONOSCOPY    . CORNEA LACERATION REPAIR    . CORONARY ARTERY BYPASS GRAFT     X 5  VESSELS  . CYSTOSCOPY N/A 04/06/2013   Procedure: CYSTOSCOPY;  Surgeon: Martina Sinner, MD;  Location: [facility];  Service: Urology;  Laterality: N/A;  . HERNIA REPAIR    . SEPTOPLASTY N/A 04/28/2015   Procedure: SEPTOPLASTY;  Surgeon: Melvenia Beam, MD;  Location: [facility] OR;  Service: ENT;  Laterality: N/A;  . SINUS ENDO W/FUSION Bilateral 04/28/2015   Procedure: ENDOSCOPIC SINUS SURGERY WITH NAVIGATION;  Surgeon: Melvenia Beam, MD;  Location: [facility] OR;  Service: ENT;  Laterality: Bilateral;  . TUBAL LIGATION      Current Outpatient Prescriptions  Medication Sig Dispense Refill  . ALPRAZolam (XANAX) 0.25 MG tablet Take 0.25 mg by mouth 2 (two) times daily as needed for anxiety.    [name] aspirin 81 MG tablet Take 81 mg by mouth daily.    [name] atorvastatin (LIPITOR) 80 MG tablet Take 80 mg by mouth at bedtime.    . Biotin 5000 MCG CAPS Take by mouth.    . cetirizine (ZYRTEC) 10 MG tablet Take 10 mg by mouth daily.    [name] esomeprazole (NEXIUM) 20 MG capsule Take x 2 Take 30-60 min before first meal of the day (Patient taking differently: Take 40 mg by mouth daily before breakfast. Take x 2 Take 30-60 min before first meal of the day)    . famotidine (PEPCID) 20 MG tablet One at bedtime (Patient taking differently: Take 20 mg by mouth at bedtime. One at  bedtime) 30 tablet 2  . fluticasone (FLONASE) 50 MCG/ACT nasal spray Place 2 sprays into the nose daily.    . furosemide (LASIX) 40 MG tablet Take 40 mg by mouth every morning.     . gabapentin (NEURONTIN) 100 MG capsule Take 1 capsule (100 mg total) by mouth 3 (three) times daily. 90 capsule 0  . guaiFENesin (MUCINEX) 600 MG 12 hr tablet Take 1,200 mg by mouth 2 (two) times daily as needed for congestion.    [name] ibuprofen (ADVIL,MOTRIN) 200 MG tablet Take 200 mg by mouth every 6 (six) hours as needed.    [name] levothyroxine (SYNTHROID, LEVOTHROID) 112 MCG tablet Take 112 mcg by mouth daily before breakfast.    . montelukast (SINGULAIR) 10 MG tablet Take 10 mg by  mouth at bedtime.    . nitroGLYCERIN (NITROSTAT) 0.4 MG SL tablet Place 0.4 mg under the tongue every 5 (five) minutes as needed for chest pain.    . polyvinyl alcohol (LIQUIFILM TEARS) 1.4 % ophthalmic solution Place 2 drops into both eyes as needed for dry eyes (3-4 times weekly).    . potassium chloride (MICRO-K) 10 MEQ CR capsule Take 30 mEq by mouth 2 (two) times daily.     . isosorbide mononitrate (IMDUR) 30 MG 24 hr tablet Take 1 tablet (30 mg total) by mouth daily. 90 tablet 3   No current facility-administered medications for this visit.    Allergies:  Ivp dye [iodinated diagnostic agents]; Betadine [povidone iodine]; Codeine; Other; Penicillins; and Sulfa antibiotics   Social History: The patient  reports that she has never smoked. She has never used smokeless tobacco. She reports that she drinks alcohol. She reports that she does not use drugs.   ROS:  Please see the history of present illness. Otherwise, complete review of systems is positive for lower back pain.  All other systems are reviewed and negative.   Physical Exam: VS:  BP (!) 148/82   Pulse 70   Ht  (1.6 m)   Wt 134 lb (60.8 kg)   SpO2 97%   BMI 23.74 kg/m , BMI Body mass index is 23.74 kg/m.  Wt Readings from Last 3 Encounters:  12/15/15 134 lb (60.8 kg)  11/16/15 137 lb 6.4 oz (62.3 kg)  10/17/15 132 lb (59.9 kg)    General: Patient appears comfortable at rest. HEENT: Conjunctiva and lids normal, oropharynx clear. Neck: Supple, no elevated JVP, right carotid bruit, no thyromegaly. Lungs: Decreased breath sounds without wheezing, nonlabored breathing at rest. Cardiac: Regular rate and rhythm, no S3 or significant systolic murmur, no pericardial rub. Abdomen: Soft, nontender, bowel sounds present, no guarding or rebound. Extremities: No pitting edema, distal pulses 2+. Skin: Warm and dry. Musculoskeletal: No kyphosis. Neuropsychiatric: Alert and oriented x3, affect grossly appropriate.  ECG: I  personally reviewed the tracing from 04/04/2015 which showed sinus rhythm with left atrial enlargement and nonspecific T-wave changes.  Recent Labwork: 04/20/2015: BUN 8; Creatinine, Ser 1.01; Hemoglobin 12.8; Platelets 356; Potassium 3.9; Sodium 142   Other Studies Reviewed Today:  Lexiscan Cardiolite 08/03/2014: FINDINGS: Stress data: The patient was stressed according to the Lexiscan protocol. The heart rate ranged from 53 up to 86 beats per min. The blood pressure averaged 133/69. The patient experienced non limiting chest pain.  Baseline ECG demonstrated sinus rhythm with a diffuse nonspecific T-wave abnormality. There were more pronounced T-wave inversion is with Lexiscan infusion. No arrhythmias were noted this is a nondiagnostic ECG.  Perfusion: No decreased activity in the  left ventricle on stress imaging to suggest reversible ischemia or infarction.  Wall Motion: Normal left ventricular wall motion. No left ventricular dilation.  Left Ventricular Ejection Fraction: 54 %  End diastolic volume 43 ml  End systolic volume 20 ml  IMPRESSION: 1. No reversible ischemia or infarction.  2. Normal left ventricular wall motion.  3. Left ventricular ejection fraction 54%  4. Low-risk stress test findings*.  Echocardiogram 08/03/2014: Study Conclusions  - Left ventricle: The cavity size was normal. There was mild concentric hypertrophy. Systolic function was normal. The estimated ejection fraction was in the range of 60% to 65%. Wall motion was normal; there were no regional wall motion abnormalities. Doppler parameters are consistent with abnormal left ventricular relaxation (grade 1 diastolic dysfunction). Doppler parameters are consistent with high ventricular filling pressure. - Aortic valve: Mildly calcified annulus. Trileaflet. - Mitral valve: Mildly calcified annulus. - Tricuspid valve: There was mild-moderate regurgitation. - Pulmonary  arteries: PA peak pressure: 35 mm Hg (S). Mildly elevated pulmonary pressures.  Carotid Dopplers 11/16/2015: Stable 1-39% bilateral ICA stenoses.  Assessment and Plan:  1. Symptomatically stable CAD status post previous CABG in 2006. Cardiolite study from last year was low risk. I encouraged her to continue her regular walking regimen for exercise. No changes made in medical therapy for now.  2. Hyperlipidemia, continues on Lipitor. Requesting recent lab work from Dr. Loney Hering.  3. Mild carotid artery atherosclerosis, stable by recent evaluation. Continue aspirin and statin therapy.  4. Hypertension. Continue medical therapy and exercise plan.  Current medicines were reviewed with the patient today.  Disposition: Follow-up with been 6 months.  Signed, Jonelle Sidle, MD, [facility] 12/15/2015 10:23 AM    [facility] [address] [place], [place], [place] [address] Phone: [phone]; Fax: [phone]

## 2015-12-15 ENCOUNTER — Ambulatory Visit (INDEPENDENT_AMBULATORY_CARE_PROVIDER_SITE_OTHER): Payer: Medicare Other | Admitting: Cardiology

## 2015-12-15 ENCOUNTER — Encounter: Payer: Self-pay | Admitting: *Deleted

## 2015-12-15 ENCOUNTER — Encounter: Payer: Self-pay | Admitting: Cardiology

## 2015-12-15 VITALS — BP 148/82 | HR 70 | Ht 63.0 in | Wt 134.0 lb

## 2015-12-15 DIAGNOSIS — I739 Peripheral vascular disease, unspecified: Secondary | ICD-10-CM

## 2015-12-15 DIAGNOSIS — I25810 Atherosclerosis of coronary artery bypass graft(s) without angina pectoris: Secondary | ICD-10-CM | POA: Diagnosis not present

## 2015-12-15 DIAGNOSIS — I10 Essential (primary) hypertension: Secondary | ICD-10-CM

## 2015-12-15 DIAGNOSIS — I779 Disorder of arteries and arterioles, unspecified: Secondary | ICD-10-CM

## 2015-12-15 DIAGNOSIS — E785 Hyperlipidemia, unspecified: Secondary | ICD-10-CM | POA: Diagnosis not present

## 2015-12-15 MED ORDER — ISOSORBIDE MONONITRATE ER 30 MG PO TB24: 30.0000 mg | ORAL_TABLET | Freq: Every day | ORAL | 3 refills | Status: DC

## 2015-12-15 NOTE — Patient Instructions (Addendum)

## 2015-12-18 ENCOUNTER — Ambulatory Visit (INDEPENDENT_AMBULATORY_CARE_PROVIDER_SITE_OTHER): Payer: Medicare Other | Admitting: Orthopedic Surgery

## 2015-12-18 ENCOUNTER — Encounter: Payer: Self-pay | Admitting: Orthopedic Surgery

## 2015-12-18 VITALS — BP 175/95 | Ht 63.0 in | Wt 135.0 lb

## 2015-12-18 DIAGNOSIS — M4806 Spinal stenosis, lumbar region: Secondary | ICD-10-CM

## 2015-12-18 DIAGNOSIS — M48061 Spinal stenosis, lumbar region without neurogenic claudication: Secondary | ICD-10-CM

## 2015-12-18 MED ORDER — IBUPROFEN 200 MG PO TABS: 200.0000 mg | ORAL_TABLET | Freq: Three times a day (TID) | ORAL | 5 refills | Status: DC | PRN

## 2015-12-18 MED ORDER — GABAPENTIN 100 MG PO CAPS: 100.0000 mg | ORAL_CAPSULE | Freq: Three times a day (TID) | ORAL | 5 refills | Status: DC

## 2015-12-18 NOTE — Progress Notes (Signed)
Patient ID: Debbie [name], female   DOB: 18-Feb-1941, 75 y.o.   MRN: [id number]  MRI FOLLOW UP  Chief Complaint  Patient presents with  . Follow-up    LOW BACK PAIN    HPI Debbie [name] is a 75 y.o. female.    MRI OF THE Lumbar spine  The patient reports that the numbness in her leg has resolved but she still has a lot of pain in the leg and lower back  Review of Systems  Constitutional: Negative for chills and fever.  Gastrointestinal: Negative for constipation and diarrhea.  Genitourinary: Negative for frequency and urgency.  Neurological: Negative for tingling.    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 No clear cause of the presenting symptoms is identified. Non-compressive disc bulges in the lumbar region. Facet degenerative change at L4-5 and to a lesser extent at L5-S1 which could possibly be associated with referred facet syndrome type pain. No visible neural compression however.     Electronically Signed   By: Paulina Fusi M.D.   On: 12/05/2015 15:57    The plan is to continue with oral medication she did not want epidural injection she will call us if her right leg goes numb and then we can set up an epidural series at L4-L5.  Fuller Canada, MD 12/18/2015 2:38 PM

## 2015-12-18 NOTE — Patient Instructions (Signed)
Call the office if any signs of numbness or tingling in the right leg otherwise continue current medications

## 2015-12-27 ENCOUNTER — Telehealth: Payer: Self-pay | Admitting: Orthopedic Surgery

## 2015-12-27 NOTE — Telephone Encounter (Signed)
Okay yet ready further questions in [facility] what kind of brace what type of back brace what style etc.

## 2015-12-27 NOTE — Telephone Encounter (Signed)
Routing to Dr Harrison for review 

## 2015-12-27 NOTE — Telephone Encounter (Signed)
Ms. Debbie [name] is asking if Dr. Romeo AppleHarrison will do a prescription for a lowerback brace [facility]

## 2015-12-28 NOTE — Telephone Encounter (Signed)
Patient called back to relay that she has not found any information on the type of back brace she would like, but [facility] and will [facility].  States just knows her back is hurting again, so she thought a brace may help.

## 2016-07-18 ENCOUNTER — Ambulatory Visit (INDEPENDENT_AMBULATORY_CARE_PROVIDER_SITE_OTHER): Payer: Medicare Other | Admitting: Adult Health

## 2016-07-18 ENCOUNTER — Other Ambulatory Visit: Payer: Self-pay | Admitting: Adult Health

## 2016-07-18 ENCOUNTER — Ambulatory Visit (HOSPITAL_COMMUNITY)
Admission: RE | Admit: 2016-07-18 | Discharge: 2016-07-18 | Disposition: A | Discharge status: Home / Self Care | Payer: Medicare Other | Source: Ambulatory Visit | Attending: Adult Health | Admitting: Adult Health

## 2016-07-18 ENCOUNTER — Encounter: Payer: Self-pay | Admitting: Adult Health

## 2016-07-18 VITALS — BP 116/84 | HR 85 | Ht 63.0 in | Wt 134.0 lb

## 2016-07-18 DIAGNOSIS — M79604 Pain in right leg: Secondary | ICD-10-CM | POA: Diagnosis present

## 2016-07-18 DIAGNOSIS — I10 Essential (primary) hypertension: Secondary | ICD-10-CM | POA: Diagnosis not present

## 2016-07-18 DIAGNOSIS — I2510 Atherosclerotic heart disease of native coronary artery without angina pectoris: Secondary | ICD-10-CM | POA: Diagnosis not present

## 2016-07-18 NOTE — Patient Instructions (Signed)
Your physician recommends that you schedule a follow-up appointment in: 1 Week   Your physician recommends that you continue on your current medications as directed. Please refer to the Current Medication list given to you today.   Your physician has requested that you have a lower  extremity venous duplex. This test is an ultrasound of the veins in the legs. It looks at venous blood flow that carries blood from the heart to the legs. Allow one hour for a Lower Venous exam. Allow thirty minutes for an Upper Venous exam. There are no restrictions or special instructions.    If you need a refill on your cardiac medications before your next appointment, please call your pharmacy.  Thank you for choosing [facility]!

## 2016-07-18 NOTE — Progress Notes (Signed)
Name: Debbie [name]    DOB: 03/17/1941  Age: 76 y.o.  MR#: [id number]       PCP:  Ernestine ConradBLUTH, KIRK, MD      Insurance: Payor: [employer] / Plan: [facility] / Product Type: *No Product type* /   CC:   No chief complaint on file.   VS Vitals:   07/18/16 1444  Pulse: 85  SpO2: 95%  Weight: 134 lb (60.8 kg)  Height: 5\' 3"  (1.6 m)    Weights Current Weight  07/18/16 134 lb (60.8 kg)  12/18/15 135 lb (61.2 kg)  12/15/15 134 lb (60.8 kg)    Blood Pressure  BP Readings from Last 3 Encounters:  12/18/15 (!) 175/95  12/15/15 (!) 148/82  11/16/15 (!) 167/74     Admit date:  (Not on file) Last encounter with RMR:  Visit date not found   Allergy Ivp dye [iodinated diagnostic agents]; Betadine [povidone iodine]; Codeine; Other; Penicillins; and Sulfa antibiotics  Current Outpatient Prescriptions  Medication Sig Dispense Refill  . ALPRAZolam (XANAX) 0.25 MG tablet Take 0.25 mg by mouth 2 (two) times daily as needed for anxiety.    [name]. aspirin 81 MG tablet Take 81 mg by mouth daily.    [name]. atorvastatin (LIPITOR) 80 MG tablet Take 80 mg by mouth at bedtime.    . Biotin 5000 MCG CAPS Take by mouth.    . cetirizine (ZYRTEC) 10 MG tablet Take 10 mg by mouth daily.    [name]. esomeprazole (NEXIUM) 20 MG capsule Take x 2 Take 30-60 min before first meal of the day (Patient taking differently: Take 40 mg by mouth daily before breakfast. Take x 2 Take 30-60 min before first meal of the day)    . famotidine (PEPCID) 20 MG tablet One at bedtime (Patient taking differently: Take 20 mg by mouth at bedtime. One at bedtime) 30 tablet 2  . fluticasone (FLONASE) 50 MCG/ACT nasal spray Place 2 sprays into the nose daily.    . furosemide (LASIX) 40 MG tablet Take 60 mg by mouth daily.    [name]. gabapentin (NEURONTIN) 100 MG capsule Take 1 capsule (100 mg total) by mouth 3 (three) times daily. 90 capsule 5  . guaiFENesin (MUCINEX) 600 MG 12 hr tablet Take 1,200 mg by mouth 2 (two) times daily as needed  for congestion.    [name]. ibuprofen (ADVIL,MOTRIN) 200 MG tablet Take 1 tablet (200 mg total) by mouth every 8 (eight) hours as needed. 90 tablet 5  . levothyroxine (SYNTHROID, LEVOTHROID) 112 MCG tablet Take 112 mcg by mouth daily before breakfast.    . montelukast (SINGULAIR) 10 MG tablet Take 10 mg by mouth at bedtime.    . nitroGLYCERIN (NITROSTAT) 0.4 MG SL tablet Place 0.4 mg under the tongue every 5 (five) minutes as needed for chest pain.    . polyvinyl alcohol (LIQUIFILM TEARS) 1.4 % ophthalmic solution Place 2 drops into both eyes as needed for dry eyes (3-4 times weekly).    . potassium chloride (MICRO-K) 10 MEQ CR capsule Take 20 mEq by mouth 2 (two) times daily. May take up to 60 meq daily     No current facility-administered medications for this visit.     Discontinued Meds:    Medications Discontinued During This Encounter  Medication Reason  . furosemide (LASIX) 40 MG tablet Error  . isosorbide mononitrate (IMDUR) 30 MG 24 hr tablet Error  . potassium chloride (MICRO-K) 10 MEQ CR capsule Error    Patient Active  Problem List   Diagnosis Date Noted  . Dyspnea 01/24/2015  . Hypokalemia 09/20/2014  . Atypical chest pain 09/20/2014  . Carotid artery disease (HCC)   . CAD (coronary artery disease)   . Essential hypertension   . Hypothyroidism   . GERD (gastroesophageal reflux disease)   . Allergic rhinitis   . Anxiety   . Hyperlipidemia   . Hx of CABG   . Ejection fraction   . Tricuspid regurgitation     LABS    Component Value Date/Time   NA 142 04/20/2015 1135   NA 140 09/21/2014 0400   NA 137 09/20/2014 1920   K 3.9 04/20/2015 1135   K 3.3 (L) 09/21/2014 0400   K 2.5 (LL) 09/20/2014 1920   CL 104 04/20/2015 1135   CL 101 09/21/2014 0400   CL 93 (L) 09/20/2014 1920   CO2 31 04/20/2015 1135   CO2 29 09/21/2014 0400   CO2 34 (H) 09/20/2014 1920   GLUCOSE 97 04/20/2015 1135   GLUCOSE 101 (H) 09/21/2014 0400   GLUCOSE 129 (H) 09/20/2014 1920   BUN 8  04/20/2015 1135   BUN 17 09/21/2014 0400   BUN 22 (H) 09/20/2014 1920   CREATININE 1.01 (H) 04/20/2015 1135   CREATININE 0.92 09/21/2014 0400   CREATININE 1.11 (H) 09/20/2014 1920   CREATININE 1.00 08/01/2014 1609   CALCIUM 9.5 04/20/2015 1135   CALCIUM 9.3 09/21/2014 0400   CALCIUM 9.3 09/20/2014 1920   GFRNONAA 53 (L) 04/20/2015 1135   GFRNONAA >60 09/21/2014 0400   GFRNONAA 48 (L) 09/20/2014 1920   GFRAA >60 04/20/2015 1135   GFRAA >60 09/21/2014 0400   GFRAA 56 (L) 09/20/2014 1920   CMP     Component Value Date/Time   NA 142 04/20/2015 1135   K 3.9 04/20/2015 1135   CL 104 04/20/2015 1135   CO2 31 04/20/2015 1135   GLUCOSE 97 04/20/2015 1135   BUN 8 04/20/2015 1135   CREATININE 1.01 (H) 04/20/2015 1135   CREATININE 1.00 08/01/2014 1609   CALCIUM 9.5 04/20/2015 1135   GFRNONAA 53 (L) 04/20/2015 1135   GFRAA >60 04/20/2015 1135       Component Value Date/Time   WBC 6.5 04/20/2015 1135   WBC 6.9 09/20/2014 1633   WBC 6.1 03/31/2013 1135   HGB 12.8 04/20/2015 1135   HGB 13.4 09/20/2014 1633   HGB 11.5 (L) 04/07/2013 0445   HCT 40.1 04/20/2015 1135   HCT 40.0 09/20/2014 1633   HCT 34.4 (L) 04/07/2013 0445   MCV 86.4 04/20/2015 1135   MCV 84.4 09/20/2014 1633   MCV 85.4 03/31/2013 1135    Lipid Panel  No results found for: CHOL, TRIG, HDL, CHOLHDL, VLDL, LDLCALC, LDLDIRECT  ABG No results found for: PHART, PCO2ART, PO2ART, HCO3, TCO2, ACIDBASEDEF, O2SAT   Lab Results  Component Value Date   TSH 2.428 09/20/2014   BNP (last 3 results) No results for input(s): BNP in the last 8760 hours.  ProBNP (last 3 results) No results for input(s): PROBNP in the last 8760 hours.  Cardiac Panel (last 3 results) No results for input(s): CKTOTAL, CKMB, TROPONINI, RELINDX in the last 72 hours.  Iron/TIBC/Ferritin/ %Sat No results found for: IRON, TIBC, FERRITIN, IRONPCTSAT   EKG Orders placed or performed in visit on 02/01/15  . EKG     Prior Assessment and  Plan Problem List as of 07/18/2016 Reviewed: 12/18/2015  2:36 PM by Fuller Canada, MD     Cardiovascular and Mediastinum  CAD (coronary artery disease)   Last Assessment & Plan 02/25/2013 Office Visit Written 02/25/2013  9:40 AM by Luis Abed, MD    Coronary disease is stable. She does not need any further evaluation at this time. She is stable. I'll see her back in one year.      Essential hypertension   Last Assessment & Plan 01/24/2015 Office Visit Written 01/25/2015  7:01 AM by Nyoka Cowden, MD    Record is unclear whether she is on an ACE inhibitor or not.  ACE inhibitors are problematic in  pts with airway complaints because  even experienced pulmonologists can't always distinguish ace effects from copd/asthma.  By themselves they don't actually cause a problem, much like oxygen can't by itself start a fire, but they certainly serve as a powerful catalyst or enhancer for any "fire"  or inflammatory process in the upper airway, be it caused by an ET  tube or more commonly reflux (especially in the obese or pts with known GERD or who are on biphoshonates).    In the era of ARB near equivalency until we have a better handle on the reversibility of the airway problem, it just makes sense to avoid ACEI  entirely in the short run and then decide later, having established a level of airway control using a reasonable limited regimen, whether to add back ace but even then being very careful to observe the pt for worsening airway control and number of meds used/ needed to control symptoms.        Tricuspid regurgitation   Carotid artery disease [facility])   Last Assessment & Plan 02/25/2013 Office Visit Written 02/25/2013  9:40 AM by Luis Abed, MD    Her carotids are being followed carefully.        Respiratory   Allergic rhinitis     Digestive   GERD (gastroesophageal reflux disease)     Endocrine   Hypothyroidism   Last Assessment & Plan 09/04/2012 Office Visit Written 09/04/2012  2:36 PM  by Luis Abed, MD    There is a history of hypothyroidism. She is treated for this.          Other   Anxiety   Hyperlipidemia   Last Assessment & Plan 02/25/2013 Office Visit Written 02/25/2013  9:39 AM by Luis Abed, MD    Lipitor treated with guidelines directed therapy.      Hx of CABG   Ejection fraction   Last Assessment & Plan 09/04/2012 Office Visit Written 09/04/2012  2:35 PM by Luis Abed, MD    We know her ejection fraction is 55-65% by echo in 2011.      Hypokalemia   Atypical chest pain   Dyspnea   Last Assessment & Plan 02/07/2015 Office Visit Edited 02/08/2015 11:52 AM by Nyoka Cowden, MD    - echo 07/2014  Left ventricle: The cavity size was normal. There was mild concentric hypertrophy. Systolic function was normal. The estimated ejection fraction was in the range of 60% to 65%. Wall motion was normal; there were no regional wall motion abnormalities. Doppler parameters are consistent with abnormal left ventricular relaxation (grade 1 diastolic dysfunction). Doppler parameters are consistent with high ventricular filling pressure. - Aortic valve: Mildly calcified annulus. Trileaflet. - Mitral valve: Mildly calcified annulus. - Tricuspid valve: There was mild-moderate regurgitation. - Pulmonary arteries: PA peak pressure: 35 mm Hg (S). Mildly elevated pulmonary pressures - 01/24/2015  Walked RA x 3 laps @ 185  ft each stopped due to min sob, nl pace, no desats   This is clearly improved off of ACE inhibitor's and off of all inhalers and just taking medications for reflux. 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 If she does find she has a persistent cough or limiting dyspnea while on gerd rx  we need to see her back here as we really don't have another good explanation for her symptoms.  I had an extended  discussion with the patient reviewing all relevant studies completed to date and  lasting 15 to 20 minutes of a 25 minute visit    Each maintenance medication was reviewed in detail including most importantly the difference between maintenance and prns and under what circumstances the prns are to be triggered using an action plan format that is not reflected in the computer generated alphabetically organized AVS.    Please see instructions for details which were reviewed in writing and the patient given a copy highlighting the part that I personally wrote and discussed at today's ov.           Imaging: No results found.

## 2016-07-18 NOTE — Progress Notes (Signed)
Cardiology Office Note   Date:  07/18/2016   ID:  Debbie [name], DOB 1941/01/10, MRN [id number]  PCP:  Ernestine ConradBLUTH, KIRK, MD  Cardiologist: McDowell-seen by:   Joni ReiningKathryn Yaeko Fazekas, NP   Chief Complaint  Patient presents with  . Leg Pain  . Coronary Artery Disease      History of Present Illness: Debbie [name] is a 76 y.o. female who presents for ongoing assessment and management for hypertension, CAD status post coronary artery bypass grafting in 2006, history of carotid artery disease, the patient has multiple medical issues. She was last seen by Dr. Diona BrownerMcDowell on 12/15/2015. On the last office visit, the patient was stable from a cardiac standpoint with no changes made in medication regimen with no planned  Testing.  She states she is having pain and swelling in the right lower leg. Has been worsening over the last 3 weeks, to the point of pain with weight bearing. She denies coldness, numbness or cramping.    Past Medical History:  Diagnosis Date  . Allergic rhinitis   . Anxiety   . Arthritis   . Asthma   . Bladder spasms   . CAD (coronary artery disease)    [facility]  . Carotid artery disease (HCC)   . Collagen vascular disease (HCC)   . COPD (chronic obstructive pulmonary disease) (HCC)   . Cystocele   . Essential hypertension   . GERD (gastroesophageal reflux disease)   . Hyperlipidemia   . Hypothyroidism   . Hypothyroidism   . Osteoporosis   . Renal insufficiency   . Vaginal vault prolapse     Past Surgical History:  Procedure Laterality Date  . ANTERIOR AND POSTERIOR REPAIR N/A 04/06/2013   Procedure: CYSTOCELE REPAIR ;  Surgeon: Martina SinnerScott A MacDiarmid, MD;  Location: [facility];  Service: Urology;  Laterality: N/A;  . CATARACTS     REMOVED  . COLONOSCOPY    . CORNEA LACERATION REPAIR    . CORONARY ARTERY BYPASS GRAFT     X 5 VESSELS  . CYSTOSCOPY N/A 04/06/2013   Procedure: CYSTOSCOPY;  Surgeon: Martina SinnerScott A MacDiarmid, MD;  Location: [facility];   Service: Urology;  Laterality: N/A;  . HERNIA REPAIR    . SEPTOPLASTY N/A 04/28/2015   Procedure: SEPTOPLASTY;  Surgeon: Melvenia BeamMitchell Gore, MD;  [facility];  Service: ENT;  Laterality: N/A;  . SINUS ENDO W/FUSION Bilateral 04/28/2015   Procedure: ENDOSCOPIC SINUS SURGERY WITH NAVIGATION;  Surgeon: Melvenia BeamMitchell Gore, MD;  [facility];  Service: ENT;  Laterality: Bilateral;  . TUBAL LIGATION       Current Outpatient Prescriptions  Medication Sig Dispense Refill  . ALPRAZolam (XANAX) 0.25 MG tablet Take 0.25 mg by mouth 2 (two) times daily as needed for anxiety.    [name]. aspirin 81 MG tablet Take 81 mg by mouth daily.    [name]. atorvastatin (LIPITOR) 80 MG tablet Take 80 mg by mouth at bedtime.    . Biotin 5000 MCG CAPS Take by mouth.    . cetirizine (ZYRTEC) 10 MG tablet Take 10 mg by mouth daily.    [name]. esomeprazole (NEXIUM) 20 MG capsule Take x 2 Take 30-60 min before first meal of the day (Patient taking differently: Take 40 mg by mouth daily before breakfast. Take x 2 Take 30-60 min before first meal of the day)    . famotidine (PEPCID) 20 MG tablet One at bedtime (Patient taking differently: Take 20 mg by mouth at bedtime. One at bedtime) 30  tablet 2  . fluticasone (FLONASE) 50 MCG/ACT nasal spray Place 2 sprays into the nose daily.    . furosemide (LASIX) 40 MG tablet Take 60 mg by mouth daily.    [name] gabapentin (NEURONTIN) 100 MG capsule Take 1 capsule (100 mg total) by mouth 3 (three) times daily. 90 capsule 5  . guaiFENesin (MUCINEX) 600 MG 12 hr tablet Take 1,200 mg by mouth 2 (two) times daily as needed for congestion.    [name] ibuprofen (ADVIL,MOTRIN) 200 MG tablet Take 1 tablet (200 mg total) by mouth every 8 (eight) hours as needed. 90 tablet 5  . levothyroxine (SYNTHROID, LEVOTHROID) 112 MCG tablet Take 112 mcg by mouth daily before breakfast.    . montelukast (SINGULAIR) 10 MG tablet Take 10 mg by mouth at bedtime.    . nitroGLYCERIN (NITROSTAT) 0.4 MG SL tablet Place 0.4 mg under the tongue  every 5 (five) minutes as needed for chest pain.    . polyvinyl alcohol (LIQUIFILM TEARS) 1.4 % ophthalmic solution Place 2 drops into both eyes as needed for dry eyes (3-4 times weekly).    . potassium chloride (MICRO-K) 10 MEQ CR capsule Take 20 mEq by mouth 2 (two) times daily. May take up to 60 meq daily     No current facility-administered medications for this visit.     Allergies:   Ivp dye [iodinated diagnostic agents]; Betadine [povidone iodine]; Codeine; Other; Penicillins; and Sulfa antibiotics    Social History:  The patient  reports that she has never smoked. She has never used smokeless tobacco. She reports that she drinks alcohol. She reports that she does not use drugs.   Family History:  The patient's family history includes Cirrhosis in her father; Diabetes Mellitus II in her mother; Heart Problems in her mother; Lung disease in her father.    ROS: All other systems are reviewed and negative. Unless otherwise mentioned in H&P    PHYSICAL EXAM: VS:  BP 116/84   Pulse 85   Ht 5\' 3"  (1.6 m)   Wt 134 lb (60.8 kg)   SpO2 95%   BMI 23.74 kg/m  , BMI Body mass index is 23.74 kg/m. GEN: Well nourished, well developed, in no acute distress  HEENT: normal  Neck: no JVD, carotid bruits, or masses Cardiac: RRR; no murmurs, rubs, or gallops,no edema  Respiratory:  Clear to auscultation bilaterally, normal work of breathing GI: soft, nontender, nondistended, + BS MS: no deformity or atrophy  Strong pulses bilateral DP, mild edema of the right leg, but not significantly different than the right leg. Pain with flexion and weight bearing. Limping.  Skin: warm and dry, no rash Neuro:  Strength and sensation are intact Psych: euthymic mood, full affect   Recent Labs: No results found for requested labs within last 8760 hours.    Lipid Panel No results found for: CHOL, TRIG, HDL, CHOLHDL, VLDL, LDLCALC, LDLDIRECT    Wt Readings from Last 3 Encounters:  07/18/16 134 lb  (60.8 kg)  12/18/15 135 lb (61.2 kg)  12/15/15 134 lb (60.8 kg)      Other studies Reviewed: Lexiscan Cardiolite 08/03/2014: FINDINGS: Stress data: The patient was stressed according to the Lexiscan protocol. The heart rate ranged from 53 up to 86 beats per min. The blood pressure averaged 133/69. The patient experienced non limiting chest pain.  Baseline ECG demonstrated sinus rhythm with a diffuse nonspecific T-wave abnormality. There were more pronounced T-wave inversion is with Lexiscan infusion. No arrhythmias were noted this  is a nondiagnostic ECG.  Perfusion: No decreased activity in the left ventricle on stress imaging to suggest reversible ischemia or infarction.  Wall Motion: Normal left ventricular wall motion. No left ventricular dilation.  Left Ventricular Ejection Fraction: 54 %  End diastolic volume 43 ml  End systolic volume 20 ml  IMPRESSION: 1. No reversible ischemia or infarction.  2. Normal left ventricular wall motion.  3. Left ventricular ejection fraction 54%  4. Low-risk stress test findings*.  Echocardiogram 08/03/2014: Study Conclusions  - Left ventricle: The cavity size was normal. There was mild concentric hypertrophy. Systolic function was normal. The estimated ejection fraction was in the range of 60% to 65%. Wall motion was normal; there were no regional wall motion abnormalities. Doppler parameters are consistent with abnormal left ventricular relaxation (grade 1 diastolic dysfunction). Doppler parameters are consistent with high ventricular filling pressure. - Aortic valve: Mildly calcified annulus. Trileaflet. - Mitral valve: Mildly calcified annulus. - Tricuspid valve: There was mild-moderate regurgitation. - Pulmonary arteries: PA peak pressure: 35 mm Hg (S). Mildly elevated pulmonary pressures.  Carotid Dopplers 11/16/2015: Stable 1-39% bilateral ICA stenoses. ASSESSMENT AND PLAN:  1. Right  lower leg pain: Will send her for doppler ultrasound to r/o DVT. 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 Will make recommendations once ultrasound is completed.   2. Hypertension: Currently well controlled. No changes in medications at this time.   3. CAD: No complaints of chest pain or dyspnea. She Continue statin therapy and ASA.   4. Hyperlipidemia; Continue statin. Labs are followed by PCP.     Current medicines are reviewed at length with the patient today.    Labs/ tests ordered today include:   Orders Placed This Encounter  Procedures  . US Venous Img Lower Unilateral Right     Disposition:   FU with 6 months.  Signed, Joni Reining, NP  07/18/2016 3:46 PM    [facility] [address], [place], [place] [address] Phone: [phone]; Fax: [phone]

## 2016-07-22 ENCOUNTER — Telehealth: Payer: Self-pay | Admitting: *Deleted

## 2016-07-22 NOTE — Telephone Encounter (Signed)
-----   Message from Jodelle GrossKathryn M Lawrence, NP sent at 07/18/2016  4:31 PM EST ----- No evidence of a blood clot in the right leg. Would place TED hose to assist in edema and reduce pain. See PCP for more recommendations and other X-rays.

## 2016-07-22 NOTE — Telephone Encounter (Signed)
Called patient with test results. No answer. Left message to call back.  

## 2016-07-25 ENCOUNTER — Ambulatory Visit: Payer: Medicare Other | Admitting: Physician Assistant

## 2016-08-04 IMAGING — MR MR LUMBAR SPINE W/O CM
4 of 5 series · 16 of 48 positions shown · non-contrast
Comparison: Radiography 10/17/2015

CLINICAL DATA: Low back pain extending to the right leg, 3 months
duration.

EXAM:
MRI LUMBAR SPINE WITHOUT CONTRAST
TECHNIQUE: Multiplanar, multisequence MR imaging of the lumbar spine was
performed. No intravenous contrast was administered.

[Series 3: T2 · sagittal · 4.0mm · 0.74mm/px · 7 of 17 slices shown (1 of 2)]
[im 1/17]
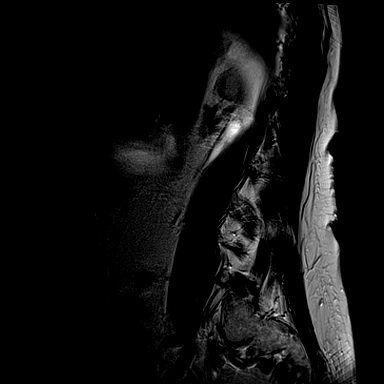
[im 3/17]
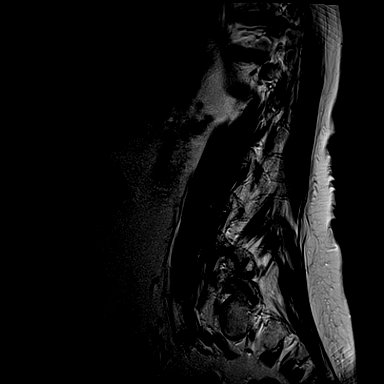
[im 6/17]
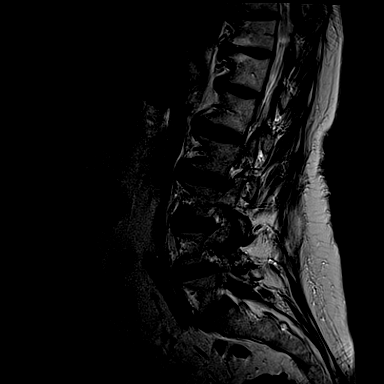
[im 9/17]
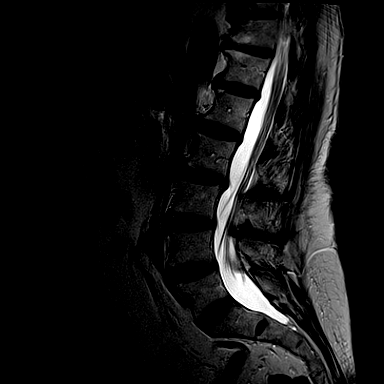
[im 11/17]
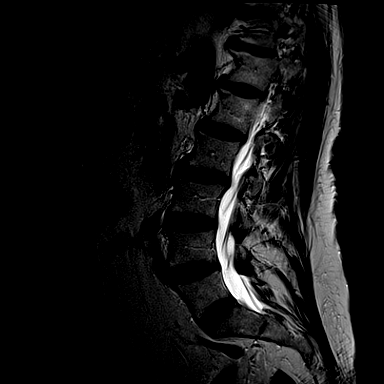
[im 14/17]
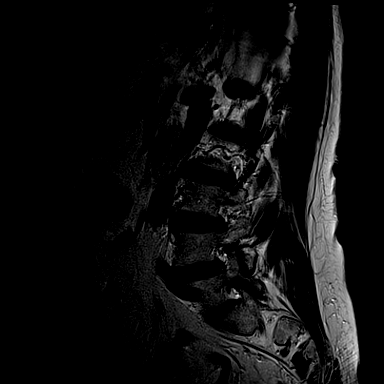
[im 17/17]
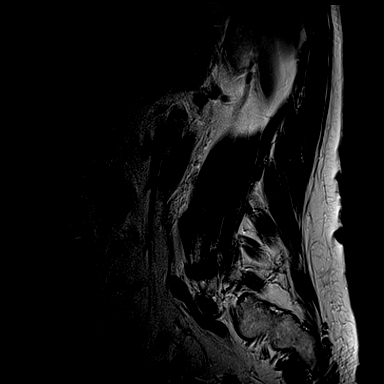

[Series 4: T1 · sagittal · 4.0mm · 0.41mm/px · 3 of 17 slices shown (1 of 2)]
[im 3/17]
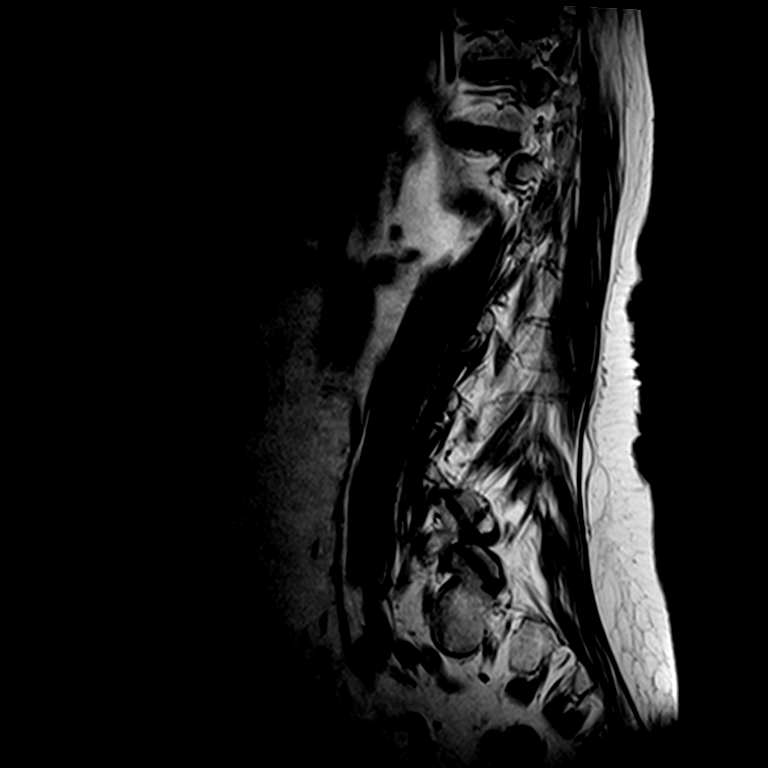
[im 9/17]
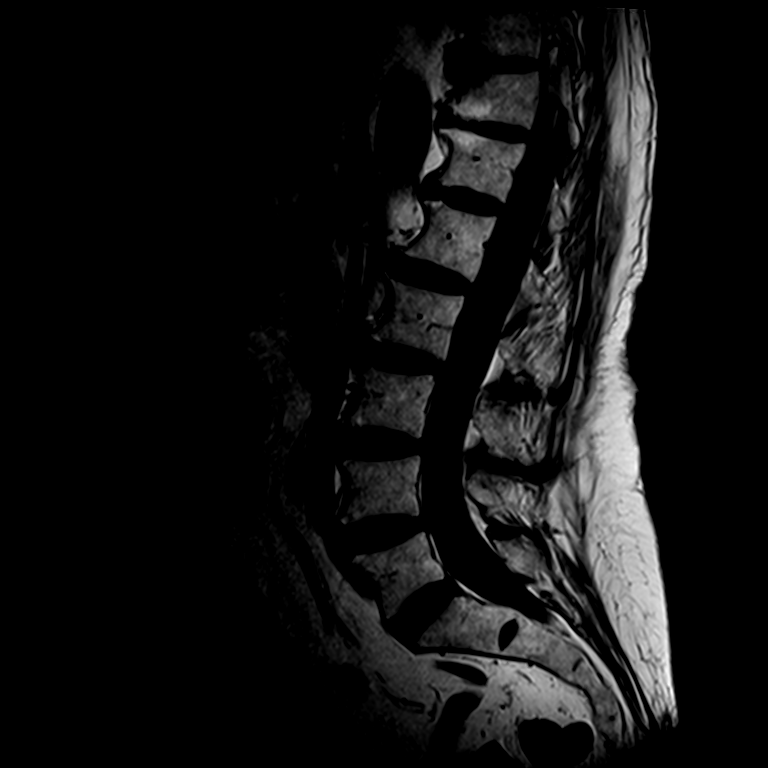
[im 14/17]
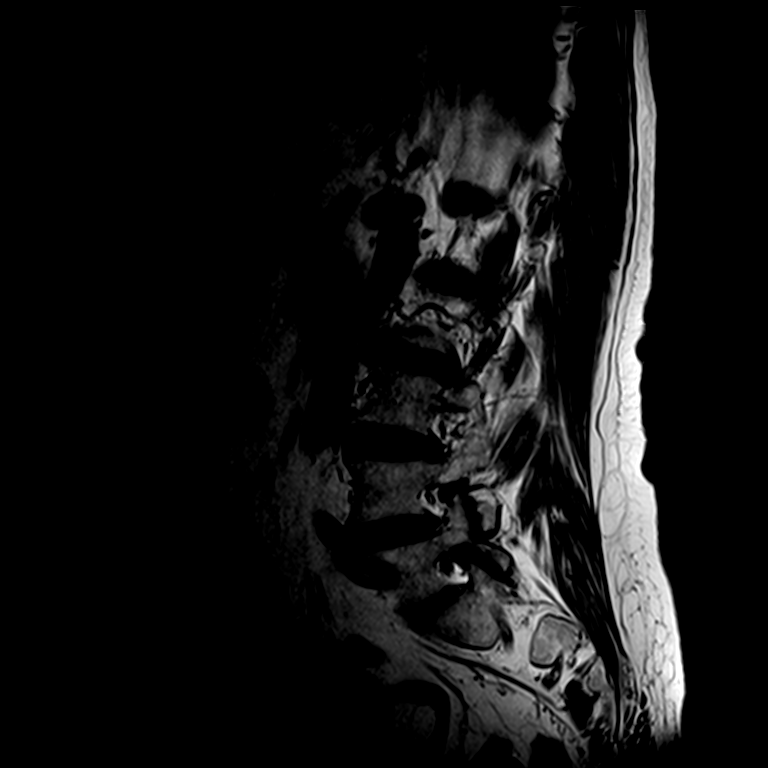

[Series 6: T2 · axial · 4.0mm · 0.23mm/px · z∈[-58,+78]mm · 3 of 38 slices shown (2 of 2)]
[im 6/38]
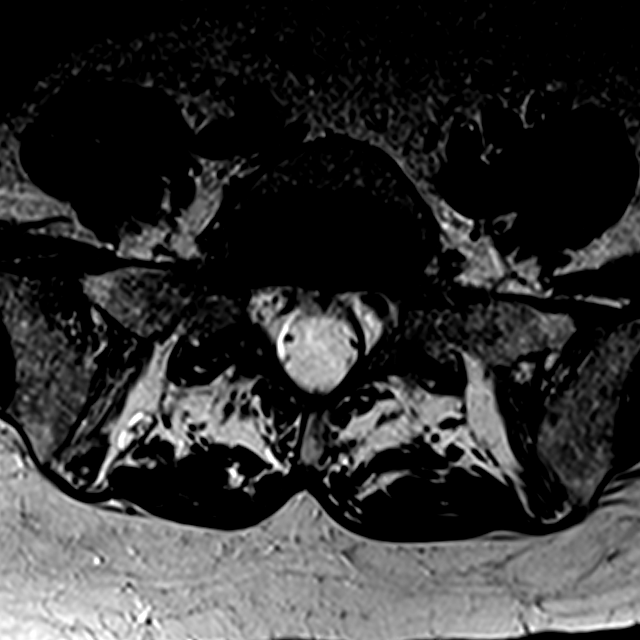
[im 20/38]
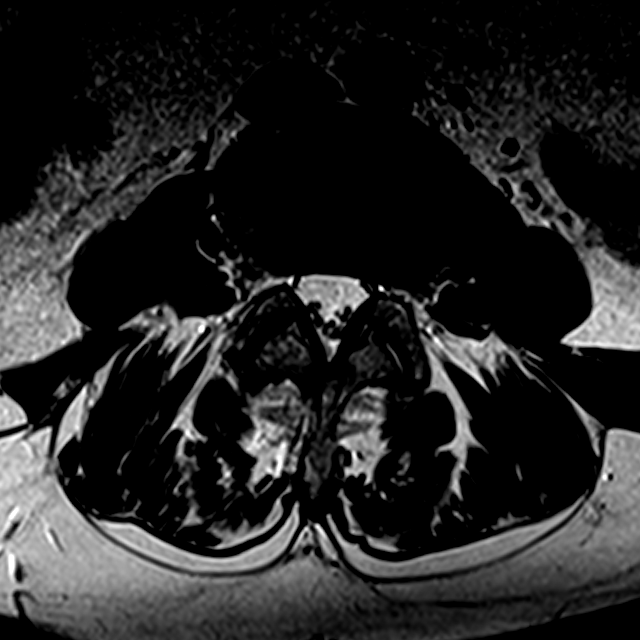
[im 32/38]
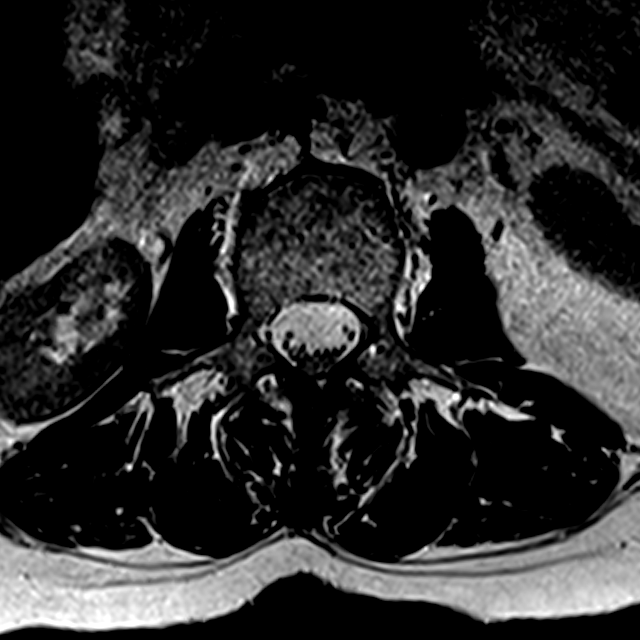

[Series 7: T1 · axial · 4.0mm · 0.23mm/px · z∈[-58,+78]mm · 3 of 38 slices shown (2 of 2)]
[im 6/38]
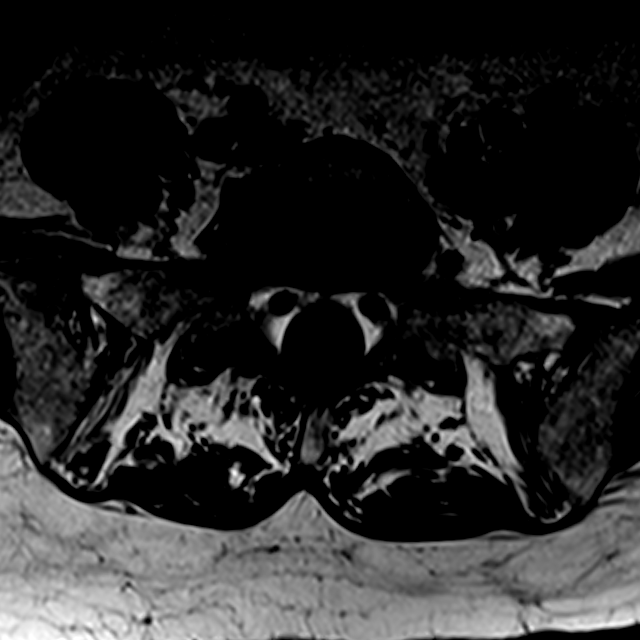
[im 20/38]
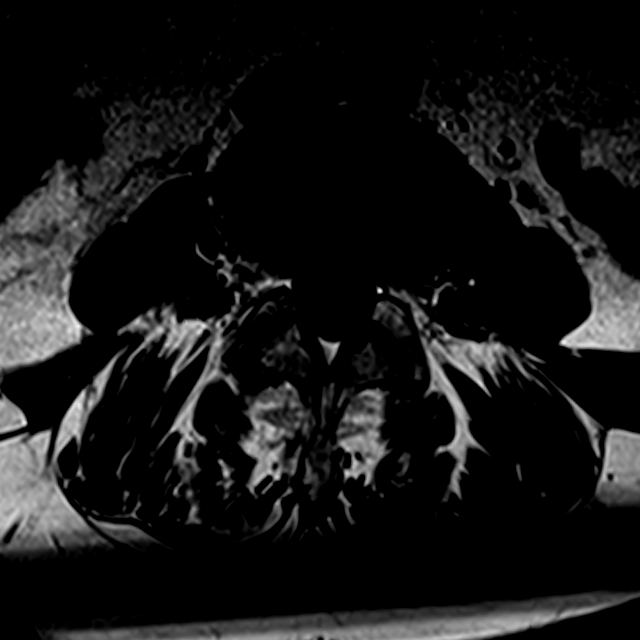
[im 32/38]
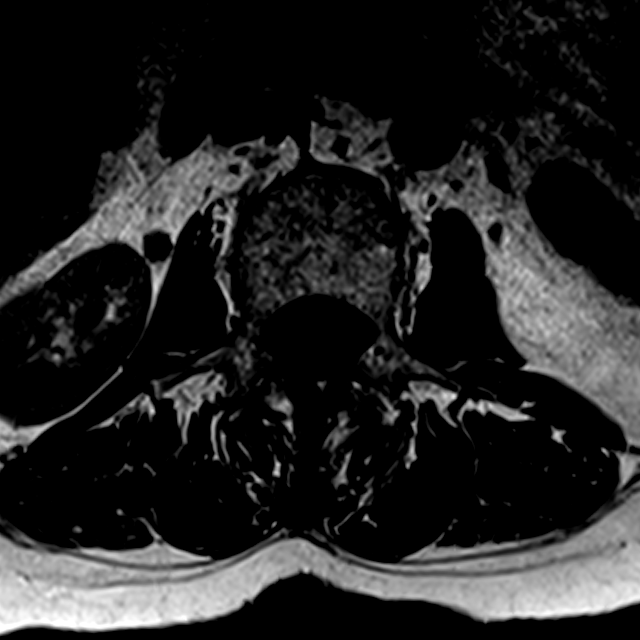

[16 of 48 positions shown; findings below may reference images not displayed]

FINDINGS: Segmentation:  5 lumbar type vertebral bodies.

Alignment:  Mild curvature convex to the left with the apex at L3.

Vertebrae:  No fracture or focal bone lesion.

Conus medullaris: Extends to the L1 level and appears normal.

Paraspinal and other soft tissues: Negative

Disc levels:

L1-2: Minimal disc bulge.  No stenosis.

L2-3: Mild disc bulge. Mild narrowing of the lateral recesses
without visible neural compression.

L3-4:  Minimal disc bulge.  No stenosis.

L4-5: Minimal disc bulge. Facet degeneration and hypertrophy. Mild
narrowing of both lateral recesses but without visible neural
compression.

L5-S1: Normal appearance of the disc. Mild facet degeneration. No
stenosis.
IMPRESSION: No advanced disease. No clear cause of the presenting symptoms is
identified. Non-compressive disc bulges in the lumbar region. Facet
degenerative change at L4-5 and to a lesser extent at L5-S1 which
could possibly be associated with referred facet syndrome type pain.
No visible neural compression however.

## 2016-08-27 ENCOUNTER — Ambulatory Visit: Payer: Medicare Other | Admitting: Cardiology

## 2016-08-27 NOTE — Progress Notes (Deleted)
Cardiology Office Note  Date: 08/27/2016   ID: Debbie [name], DOB 1940/11/21, MRN [id number]  PCP: Ernestine Conrad, MD  Primary Cardiologist: [name], MD   No chief complaint on file.   History of Present Illness: Debbie [name] is a 76 y.o. female last seen by Ms. Lawrence NP in March.  Past Medical History:  Diagnosis Date  . Allergic rhinitis   . Anxiety   . Arthritis   . Asthma   . Bladder spasms   . CAD (coronary artery disease)    CABG 2006 [facility]  . Carotid artery disease (HCC)   . Collagen vascular disease (HCC)   . COPD (chronic obstructive pulmonary disease) (HCC)   . Cystocele   . Essential hypertension   . GERD (gastroesophageal reflux disease)   . Hyperlipidemia   . Hypothyroidism   . Hypothyroidism   . Osteoporosis   . Renal insufficiency   . Vaginal vault prolapse     Past Surgical History:  Procedure Laterality Date  . ANTERIOR AND POSTERIOR REPAIR N/A 04/06/2013   Procedure: CYSTOCELE REPAIR ;  Surgeon: Martina Sinner, MD;  Location: [facility];  Service: Urology;  Laterality: N/A;  . CATARACTS     REMOVED  . COLONOSCOPY    . CORNEA LACERATION REPAIR    . CORONARY ARTERY BYPASS GRAFT     X 5 VESSELS  . CYSTOSCOPY N/A 04/06/2013   Procedure: CYSTOSCOPY;  Surgeon: Martina Sinner, MD;  Location: [facility];  Service: Urology;  Laterality: N/A;  . HERNIA REPAIR    . SEPTOPLASTY N/A 04/28/2015   Procedure: SEPTOPLASTY;  Surgeon: Melvenia Beam, MD;  [facility] OR;  Service: ENT;  Laterality: N/A;  . SINUS ENDO W/FUSION Bilateral 04/28/2015   Procedure: ENDOSCOPIC SINUS SURGERY WITH NAVIGATION;  Surgeon: Melvenia Beam, MD;  Location: [facility] OR;  Service: ENT;  Laterality: Bilateral;  . TUBAL LIGATION      Current Outpatient Prescriptions  Medication Sig Dispense Refill  . ALPRAZolam (XANAX) 0.25 MG tablet Take 0.25 mg by mouth 2 (two) times daily as needed for anxiety.    [name] aspirin 81 MG tablet Take 81 mg by mouth daily.     [name] atorvastatin (LIPITOR) 80 MG tablet Take 80 mg by mouth at bedtime.    . Biotin 5000 MCG CAPS Take by mouth.    . cetirizine (ZYRTEC) 10 MG tablet Take 10 mg by mouth daily.    [name] esomeprazole (NEXIUM) 20 MG capsule Take x 2 Take 30-60 min before first meal of the day (Patient taking differently: Take 40 mg by mouth daily before breakfast. Take x 2 Take 30-60 min before first meal of the day)    . famotidine (PEPCID) 20 MG tablet One at bedtime (Patient taking differently: Take 20 mg by mouth at bedtime. One at bedtime) 30 tablet 2  . fluticasone (FLONASE) 50 MCG/ACT nasal spray Place 2 sprays into the nose daily.    . furosemide (LASIX) 40 MG tablet Take 60 mg by mouth daily.    [name] gabapentin (NEURONTIN) 100 MG capsule Take 1 capsule (100 mg total) by mouth 3 (three) times daily. 90 capsule 5  . guaiFENesin (MUCINEX) 600 MG 12 hr tablet Take 1,200 mg by mouth 2 (two) times daily as needed for congestion.    [name] ibuprofen (ADVIL,MOTRIN) 200 MG tablet Take 1 tablet (200 mg total) by mouth every 8 (eight) hours as needed. 90 tablet 5  . levothyroxine (SYNTHROID, LEVOTHROID) 112 MCG tablet  Take 112 mcg by mouth daily before breakfast.    . montelukast (SINGULAIR) 10 MG tablet Take 10 mg by mouth at bedtime.    . nitroGLYCERIN (NITROSTAT) 0.4 MG SL tablet Place 0.4 mg under the tongue every 5 (five) minutes as needed for chest pain.    . polyvinyl alcohol (LIQUIFILM TEARS) 1.4 % ophthalmic solution Place 2 drops into both eyes as needed for dry eyes (3-4 times weekly).    . potassium chloride (MICRO-K) 10 MEQ CR capsule Take 20 mEq by mouth 2 (two) times daily. May take up to 60 meq daily     No current facility-administered medications for this visit.    Allergies:  Ivp dye [iodinated diagnostic agents]; Betadine [povidone iodine]; Codeine; Other; Penicillins; and Sulfa antibiotics   Social History: The patient  reports that she has never smoked. She has never used smokeless tobacco. She reports  that she drinks alcohol. She reports that she does not use drugs.   Family History: The patient's family history includes Cirrhosis in her father; Diabetes Mellitus II in her mother; Heart Problems in her mother; Lung disease in her father.   ROS:  Please see the history of present illness. Otherwise, complete review of systems is positive for {NONE DEFAULTED:18576::"none"}.  All other systems are reviewed and negative.   Physical Exam: VS:  There were no vitals taken for this visit., BMI There is no height or weight on file to calculate BMI.  Wt Readings from Last 3 Encounters:  07/18/16 134 lb (60.8 kg)  12/18/15 135 lb (61.2 kg)  12/15/15 134 lb (60.8 kg)    General: Patient appears comfortable at rest. HEENT: Conjunctiva and lids normal, oropharynx clear. Neck: Supple, no elevated JVP, right carotid bruit, no thyromegaly. Lungs: Decreased breath sounds without wheezing, nonlabored breathing at rest. Cardiac: Regular rate and rhythm, no S3 or significant systolic murmur, no pericardial rub. Abdomen: Soft, nontender, bowel sounds present, no guarding or rebound. Extremities: No pitting edema, distal pulses 2+. Skin: Warm and dry. Musculoskeletal: No kyphosis. Neuropsychiatric: Alert and oriented x3, affect grossly appropriate.  ECG: I personally reviewed the tracing from  Recent Labwork:  December 2016: Hemoglobin 12.8, platelets 356, potassium 3.9, BUN 8, creatinine 1.0  Other Studies Reviewed Today:  Lexiscan Cardiolite 08/03/2014: FINDINGS: Stress data: The patient was stressed according to the Lexiscan protocol. The heart rate ranged from 53 up to 86 beats per min. The blood pressure averaged 133/69. The patient experienced non limiting chest pain.  Baseline ECG demonstrated sinus rhythm with a diffuse nonspecific T-wave abnormality. There were more pronounced T-wave inversion is with Lexiscan infusion. No arrhythmias were noted this is a nondiagnostic  ECG.  Perfusion: No decreased activity in the left ventricle on stress imaging to suggest reversible ischemia or infarction.  Wall Motion: Normal left ventricular wall motion. No left ventricular dilation.  Left Ventricular Ejection Fraction: 54 %  End diastolic volume 43 ml  End systolic volume 20 ml  IMPRESSION: 1. No reversible ischemia or infarction.  2. Normal left ventricular wall motion.  3. Left ventricular ejection fraction 54%  4. Low-risk stress test findings*.  Echocardiogram 08/03/2014: Study Conclusions  - Left ventricle: The cavity size was normal. There was mild concentric hypertrophy. Systolic function was normal. The estimated ejection fraction was in the range of 60% to 65%. Wall motion was normal; there were no regional wall motion abnormalities. Doppler parameters are consistent with abnormal left ventricular relaxation (grade 1 diastolic dysfunction). Doppler parameters are consistent with high  ventricular filling pressure. - Aortic valve: Mildly calcified annulus. Trileaflet. - Mitral valve: Mildly calcified annulus. - Tricuspid valve: There was mild-moderate regurgitation. - Pulmonary arteries: PA peak pressure: 35 mm Hg (S). Mildly elevated pulmonary pressures.  Carotid Dopplers 11/16/2015: Stable 1-39% bilateral ICA stenoses.  Assessment and Plan:    Current medicines were reviewed with the patient today.  No orders of the defined types were placed in this encounter.   Disposition:  Signed, Jonelle Sidle, MD, [facility] 08/27/2016 10:20 AM    [facility] [address] [place], [place], [place] [address] Phone: [phone]; Fax: [phone]

## 2016-10-15 DIAGNOSIS — H35371 Puckering of macula, right eye: Secondary | ICD-10-CM | POA: Diagnosis not present

## 2016-10-15 DIAGNOSIS — H43813 Vitreous degeneration, bilateral: Secondary | ICD-10-CM | POA: Diagnosis not present

## 2016-10-15 DIAGNOSIS — H43392 Other vitreous opacities, left eye: Secondary | ICD-10-CM | POA: Diagnosis not present

## 2016-10-15 DIAGNOSIS — H40013 Open angle with borderline findings, low risk, bilateral: Secondary | ICD-10-CM | POA: Diagnosis not present

## 2016-10-25 ENCOUNTER — Telehealth: Payer: Self-pay | Admitting: Internal Medicine

## 2016-10-25 MED ORDER — MOMETASONE FURO-FORMOTEROL FUM 100-5 MCG/ACT IN AERO
2.0000 | INHALATION_SPRAY | Freq: Two times a day (BID) | RESPIRATORY_TRACT | 0 refills | Status: DC
Start: 2016-10-25 — End: 2016-11-06

## 2016-10-25 NOTE — Telephone Encounter (Signed)
Spoke with pt. She is needing a refill on Dulera. We have not seen her since 2016. Advised her that she would need an appointment for refills. States that her PCP can't refill this for her because he recently passed away. OV has been scheduled for 11/19/16 at 10am. Rx has been sent in to last her until this appointment. Nothing further was needed.

## 2016-10-29 ENCOUNTER — Telehealth: Payer: Self-pay | Admitting: Cardiology

## 2016-10-29 NOTE — Telephone Encounter (Signed)
Call placed to [facility] - Dr. Margo Commonapper is not able to see any extra patients at this time.  Their office will be sending out letter in the mail today notifying their patients of circumstances and to search for new provider.    Message sent to Dr. Diona BrownerMcDowell to see if he will do this for patient.

## 2016-10-29 NOTE — Telephone Encounter (Signed)
Patient notified.  Informed patient that I have left message with Dr. Rica MastYvonne Nelson [facility] to see if she is still taking new patients since she has recently come to this area.  Patient stated that she would like to stay in [place] if possible.  Her neighbor had given her Dr. Sherril CroonVyas name.  Stated she will call them first.  Suggested she call back if no luck with them & we could still try Dr. Delton SeeNelson.

## 2016-10-29 NOTE — Telephone Encounter (Signed)
I am sorry to hear that she is having this difficulty. As is my typical practice however, I do not prescribe anxiolytics or sedatives. Why don't we try to help by referring her to a another PCP since it sounds like Dr. Margo Commonapper is not going to be able to see her. If she has a preference for a new PCP, we can call that office for her and try to get her a visit there.

## 2016-10-29 NOTE — Telephone Encounter (Signed)
Patient called asking that somethig be called in to help her sleep/nerves/emotions. Her son passed away last week and she has been really upset.  Stated her PCP passed away and she has not found one to see her yet

## 2016-11-01 NOTE — Telephone Encounter (Signed)
Follow up - left message to return call.

## 2016-11-04 ENCOUNTER — Observation Stay (HOSPITAL_COMMUNITY)
Admission: EM | Admit: 2016-11-04 | Discharge: 2016-11-06 | Disposition: A | Discharge status: Home / Self Care | Payer: Medicare Other | Attending: Internal Medicine | Admitting: Internal Medicine

## 2016-11-04 ENCOUNTER — Emergency Department (HOSPITAL_COMMUNITY): Payer: Medicare Other

## 2016-11-04 ENCOUNTER — Encounter (HOSPITAL_COMMUNITY): Payer: Self-pay

## 2016-11-04 DIAGNOSIS — R51 Headache: Secondary | ICD-10-CM | POA: Insufficient documentation

## 2016-11-04 DIAGNOSIS — Z634 Disappearance and death of family member: Secondary | ICD-10-CM | POA: Diagnosis not present

## 2016-11-04 DIAGNOSIS — F419 Anxiety disorder, unspecified: Secondary | ICD-10-CM | POA: Diagnosis not present

## 2016-11-04 DIAGNOSIS — I160 Hypertensive urgency: Secondary | ICD-10-CM | POA: Diagnosis not present

## 2016-11-04 DIAGNOSIS — Z7982 Long term (current) use of aspirin: Secondary | ICD-10-CM | POA: Insufficient documentation

## 2016-11-04 DIAGNOSIS — Z79899 Other long term (current) drug therapy: Secondary | ICD-10-CM | POA: Diagnosis not present

## 2016-11-04 DIAGNOSIS — R079 Chest pain, unspecified: Secondary | ICD-10-CM | POA: Diagnosis not present

## 2016-11-04 DIAGNOSIS — R519 Headache, unspecified: Secondary | ICD-10-CM

## 2016-11-04 DIAGNOSIS — R072 Precordial pain: Secondary | ICD-10-CM | POA: Diagnosis not present

## 2016-11-04 DIAGNOSIS — E039 Hypothyroidism, unspecified: Secondary | ICD-10-CM | POA: Diagnosis present

## 2016-11-04 DIAGNOSIS — I2510 Atherosclerotic heart disease of native coronary artery without angina pectoris: Secondary | ICD-10-CM | POA: Diagnosis present

## 2016-11-04 DIAGNOSIS — F4320 Adjustment disorder, unspecified: Secondary | ICD-10-CM | POA: Diagnosis not present

## 2016-11-04 DIAGNOSIS — J449 Chronic obstructive pulmonary disease, unspecified: Secondary | ICD-10-CM | POA: Diagnosis not present

## 2016-11-04 DIAGNOSIS — I639 Cerebral infarction, unspecified: Secondary | ICD-10-CM

## 2016-11-04 DIAGNOSIS — Z951 Presence of aortocoronary bypass graft: Secondary | ICD-10-CM | POA: Insufficient documentation

## 2016-11-04 LAB — TROPONIN I: Troponin I: <0.03 ng/mL (ref <0.03)

## 2016-11-04 LAB — I-STAT TROPONIN, ED: Troponin i, poc: 0 ng/mL (ref 0.00–0.08)

## 2016-11-04 LAB — BASIC METABOLIC PANEL
Anion gap: 9 (ref 5–15)
BUN: 12 mg/dL (ref 6–20)
CO2: 28 mmol/L (ref 22–32)
Calcium: 9.4 mg/dL (ref 8.9–10.3)
Chloride: 101 mmol/L (ref 101–111)
Creatinine, Ser: 0.93 mg/dL (ref 0.44–1.00)
GFR calc Af Amer: >60 mL/min (ref >60)
GFR calc non Af Amer: 59 mL/min — ABNORMAL LOW (ref >60)
Glucose, Bld: 113 mg/dL — ABNORMAL HIGH (ref 65–99)
Potassium: 3.6 mmol/L (ref 3.5–5.1)
Sodium: 138 mmol/L (ref 135–145)

## 2016-11-04 LAB — CBC
HCT: 41.9 % (ref 36.0–46.0)
Hemoglobin: 13.3 g/dL (ref 12.0–15.0)
MCH: 27.3 pg (ref 26.0–34.0)
MCHC: 31.7 g/dL (ref 30.0–36.0)
MCV: 86 fL (ref 78.0–100.0)
Platelets: 377 10*3/uL (ref 150–400)
RBC: 4.87 MIL/uL (ref 3.87–5.11)
RDW: 13.7 % (ref 11.5–15.5)
WBC: 6.3 10*3/uL (ref 4.0–10.5)

## 2016-11-04 MED ORDER — METOPROLOL TARTRATE 25 MG PO TABS
25.0000 mg | ORAL_TABLET | Freq: Once | ORAL | Status: AC
  Administered 2016-11-04: 25 mg via ORAL
  Filled 2016-11-04: qty 1

## 2016-11-04 MED ORDER — POLYVINYL ALCOHOL 1.4 % OP SOLN
1.0000 [drp] | Freq: Every day | OPHTHALMIC | Status: DC | PRN
  Filled 2016-11-04: qty 15

## 2016-11-04 MED ORDER — HYDRALAZINE HCL 20 MG/ML IJ SOLN
10.0000 mg | Freq: Four times a day (QID) | INTRAMUSCULAR | Status: DC | PRN
  Filled 2016-11-04: qty 1

## 2016-11-04 MED ORDER — ACETAMINOPHEN 325 MG PO TABS
650.0000 mg | ORAL_TABLET | ORAL | Status: DC | PRN
  Administered 2016-11-05: 650 mg via ORAL
  Filled 2016-11-04: qty 2

## 2016-11-04 MED ORDER — LABETALOL HCL 5 MG/ML IV SOLN
10.0000 mg | Freq: Once | INTRAVENOUS | Status: AC
  Administered 2016-11-04: 10 mg via INTRAVENOUS
  Filled 2016-11-04: qty 4

## 2016-11-04 MED ORDER — HYDRALAZINE HCL 20 MG/ML IJ SOLN: 10.0000 mg | Freq: Four times a day (QID) | INTRAMUSCULAR | Status: DC | PRN

## 2016-11-04 MED ORDER — METOPROLOL TARTRATE 25 MG PO TABS
25.0000 mg | ORAL_TABLET | Freq: Two times a day (BID) | ORAL | Status: DC
  Administered 2016-11-04: 25 mg via ORAL
  Filled 2016-11-04: qty 1

## 2016-11-04 MED ORDER — ATORVASTATIN CALCIUM 80 MG PO TABS
80.0000 mg | ORAL_TABLET | Freq: Every day | ORAL | Status: DC
  Administered 2016-11-04 – 2016-11-05 (×2): 80 mg via ORAL
  Filled 2016-11-04 (×2): qty 1

## 2016-11-04 MED ORDER — LORATADINE 10 MG PO TABS
10.0000 mg | ORAL_TABLET | Freq: Every day | ORAL | Status: DC
Start: 2016-11-04 — End: 2016-11-06
  Administered 2016-11-05 – 2016-11-06 (×2): 10 mg via ORAL
  Filled 2016-11-04 (×2): qty 1

## 2016-11-04 MED ORDER — VITAMIN D 1000 UNITS PO TABS
1000.0000 [IU] | ORAL_TABLET | Freq: Every day | ORAL | Status: DC
  Administered 2016-11-05 – 2016-11-06 (×2): 1000 [IU] via ORAL
  Filled 2016-11-04 (×3): qty 1

## 2016-11-04 MED ORDER — ONDANSETRON HCL 4 MG/2ML IJ SOLN: 4.0000 mg | Freq: Four times a day (QID) | INTRAMUSCULAR | Status: DC | PRN

## 2016-11-04 MED ORDER — ISOSORBIDE MONONITRATE ER 30 MG PO TB24
30.0000 mg | ORAL_TABLET | Freq: Every day | ORAL | Status: DC
  Administered 2016-11-04 – 2016-11-06 (×3): 30 mg via ORAL
  Filled 2016-11-04 (×3): qty 1

## 2016-11-04 MED ORDER — ASPIRIN 81 MG PO CHEW
81.0000 mg | CHEWABLE_TABLET | Freq: Every day | ORAL | Status: DC
Start: 2016-11-04 — End: 2016-11-06
  Administered 2016-11-04 – 2016-11-06 (×3): 81 mg via ORAL
  Filled 2016-11-04 (×3): qty 1

## 2016-11-04 MED ORDER — MONTELUKAST SODIUM 10 MG PO TABS
10.0000 mg | ORAL_TABLET | Freq: Every day | ORAL | Status: DC
  Administered 2016-11-04 – 2016-11-05 (×2): 10 mg via ORAL
  Filled 2016-11-04 (×2): qty 1

## 2016-11-04 MED ORDER — ENOXAPARIN SODIUM 40 MG/0.4ML ~~LOC~~ SOLN
40.0000 mg | SUBCUTANEOUS | Status: DC
  Administered 2016-11-04 – 2016-11-05 (×2): 40 mg via SUBCUTANEOUS
  Filled 2016-11-04 (×2): qty 0.4

## 2016-11-04 MED ORDER — ALPRAZOLAM 0.5 MG PO TABS
0.5000 mg | ORAL_TABLET | Freq: Two times a day (BID) | ORAL | Status: DC | PRN
  Administered 2016-11-04: 0.5 mg via ORAL
  Filled 2016-11-04: qty 1

## 2016-11-04 MED ORDER — NITROGLYCERIN 2 % TD OINT
1.0000 [in_us] | TOPICAL_OINTMENT | Freq: Four times a day (QID) | TRANSDERMAL | Status: DC
  Administered 2016-11-04: 1 [in_us] via TOPICAL
  Filled 2016-11-04: qty 1

## 2016-11-04 MED ORDER — FUROSEMIDE 40 MG PO TABS
40.0000 mg | ORAL_TABLET | Freq: Every day | ORAL | Status: DC
  Administered 2016-11-05 – 2016-11-06 (×2): 40 mg via ORAL
  Filled 2016-11-04 (×3): qty 1

## 2016-11-04 MED ORDER — MOMETASONE FURO-FORMOTEROL FUM 200-5 MCG/ACT IN AERO
2.0000 | INHALATION_SPRAY | Freq: Two times a day (BID) | RESPIRATORY_TRACT | Status: DC
Start: 2016-11-04 — End: 2016-11-06
  Administered 2016-11-05 – 2016-11-06 (×2): 2 via RESPIRATORY_TRACT
  Filled 2016-11-04 (×2): qty 8.8

## 2016-11-04 MED ORDER — BACLOFEN 10 MG PO TABS
10.0000 mg | ORAL_TABLET | Freq: Every day | ORAL | Status: DC
  Administered 2016-11-04 – 2016-11-06 (×3): 10 mg via ORAL
  Filled 2016-11-04 (×3): qty 1

## 2016-11-04 MED ORDER — ALPRAZOLAM 0.25 MG PO TABS: 1.0000 mg | ORAL_TABLET | Freq: Two times a day (BID) | ORAL | Status: DC | PRN

## 2016-11-04 MED ORDER — PANTOPRAZOLE SODIUM 40 MG PO TBEC
40.0000 mg | DELAYED_RELEASE_TABLET | Freq: Every day | ORAL | Status: DC
Start: 2016-11-04 — End: 2016-11-06
  Administered 2016-11-04 – 2016-11-06 (×3): 40 mg via ORAL
  Filled 2016-11-04 (×3): qty 1

## 2016-11-04 MED ORDER — POTASSIUM CHLORIDE CRYS ER 10 MEQ PO TBCR
10.0000 meq | EXTENDED_RELEASE_TABLET | Freq: Every day | ORAL | Status: DC
  Administered 2016-11-04 – 2016-11-06 (×3): 10 meq via ORAL
  Filled 2016-11-04 (×3): qty 1

## 2016-11-04 MED ORDER — LEVOTHYROXINE SODIUM 112 MCG PO TABS
112.0000 ug | ORAL_TABLET | Freq: Every day | ORAL | Status: DC
  Administered 2016-11-05 – 2016-11-06 (×2): 112 ug via ORAL
  Filled 2016-11-04 (×2): qty 1

## 2016-11-04 NOTE — ED Notes (Signed)
Attempted report 

## 2016-11-04 NOTE — ED Provider Notes (Signed)
[facility]-EMERGENCY DEPT Provider Note   CSN: [id number] Arrival date & time: 11/04/16  1304     History   Chief Complaint Chief Complaint  Patient presents with  . Chest Pain    HPI Debbie [name] is a 76 y.o. female.  HPI Patient has been having chest pain for almost 2 weeks is been waxing and waning with tightness. She has also been experiencing headaches. Blood pressures at home have been running in the upper 180s and 190s systolic. Symptoms worsened over the past 3 days. At baseline the patient's blood pressures run 130s over 70s or 80s. Is atypical for her to have elevated pressures. She reports she is compliant with her medications. No fevers no chills. She has had some nausea but no active vomiting. No swelling of the legs or calf pain. Patient has had increased recent social stressors, her son died this month. As well she reports she has not been able to follow-up with her physician because her physician died within the past 2 months. Patient for she used to take Xanax occasionally for anxiety. She denies that she took it on any daily basis. She reports she has not been on the have any refill in about a month and a half because her physician died. Past Medical History:  Diagnosis Date  . Allergic rhinitis   . Anxiety   . Arthritis   . Asthma   . Bladder spasms   . CAD (coronary artery disease)    CABG 2006 [facility]  . Carotid artery disease (HCC)   . Collagen vascular disease (HCC)   . COPD (chronic obstructive pulmonary disease) (HCC)   . Cystocele   . Essential hypertension   . GERD (gastroesophageal reflux disease)   . Hyperlipidemia   . Hypothyroidism   . Hypothyroidism   . Osteoporosis   . Renal insufficiency   . Vaginal vault prolapse     Patient Active Problem List   Diagnosis Date Noted  . Dyspnea 01/24/2015  . Hypokalemia 09/20/2014  . Atypical chest pain 09/20/2014  . Carotid artery disease (HCC)   . CAD (coronary artery disease)   .  Essential hypertension   . Hypothyroidism   . GERD (gastroesophageal reflux disease)   . Allergic rhinitis   . Anxiety   . Hyperlipidemia   . Hx of CABG   . Ejection fraction   . Tricuspid regurgitation     Past Surgical History:  Procedure Laterality Date  . ANTERIOR AND POSTERIOR REPAIR N/A 04/06/2013   Procedure: CYSTOCELE REPAIR ;  Surgeon: Martina Sinner, MD;  Location: [facility] ORS;  Service: Urology;  Laterality: N/A;  . CATARACTS     REMOVED  . COLONOSCOPY    . CORNEA LACERATION REPAIR    . CORONARY ARTERY BYPASS GRAFT     X 5 VESSELS  . CYSTOSCOPY N/A 04/06/2013   Procedure: CYSTOSCOPY;  Surgeon: Martina Sinner, MD;  Location: [facility];  Service: Urology;  Laterality: N/A;  . HERNIA REPAIR    . SEPTOPLASTY N/A 04/28/2015   Procedure: SEPTOPLASTY;  Surgeon: Melvenia Beam, MD;  Location: [facility] OR;  Service: ENT;  Laterality: N/A;  . SINUS ENDO W/FUSION Bilateral 04/28/2015   Procedure: ENDOSCOPIC SINUS SURGERY WITH NAVIGATION;  Surgeon: Melvenia Beam, MD;  Location: [facility] OR;  Service: ENT;  Laterality: Bilateral;  . TUBAL LIGATION      OB History    No data available       Home Medications    Prior to Admission  medications   Medication Sig Start Date End Date Taking? Authorizing Provider  aspirin 81 MG tablet Take 81 mg by mouth daily.   Yes [provider]  atorvastatin (LIPITOR) 80 MG tablet Take 80 mg by mouth at bedtime.   Yes [provider]  baclofen (LIORESAL) 10 MG tablet Take 10 mg by mouth daily. 10/31/16  Yes [provider]  Biotin 5000 MCG CAPS Take 5,000 mcg by mouth daily.    Yes [provider]  cetirizine (ZYRTEC) 10 MG tablet Take 10 mg by mouth daily.   Yes [provider]  cholecalciferol (VITAMIN D) 1000 units tablet Take 1,000 Units by mouth daily.   Yes [provider]  esomeprazole (NEXIUM) 20 MG capsule Take x 2 Take 30-60 min before first meal of the day Patient taking differently: Take 20  mg by mouth daily.  01/24/15  Yes Nyoka CowdenWert, Michael B, MD  fluticasone (FLONASE) 50 MCG/ACT nasal spray Place 2 sprays into the nose daily.   Yes [provider]  Fluticasone-Salmeterol (ADVAIR) 250-50 MCG/DOSE AEPB Inhale 1 puff into the lungs every 12 (twelve) hours.   Yes [provider]  furosemide (LASIX) 40 MG tablet Take 40 mg by mouth daily.    Yes [provider]  guaiFENesin (MUCINEX) 600 MG 12 hr tablet Take 1,200 mg by mouth 2 (two) times daily as needed for congestion.   Yes [provider]  Hypromellose (ARTIFICIAL TEARS OP) Place 1 drop into both eyes daily as needed (dry eyes).   Yes [provider]  ibuprofen (ADVIL,MOTRIN) 200 MG tablet Take 1 tablet (200 mg total) by mouth every 8 (eight) hours as needed. Patient taking differently: Take 200 mg by mouth every 8 (eight) hours as needed for moderate pain.  12/18/15  Yes Vickki HearingHarrison, Stanley E, MD  isosorbide mononitrate (IMDUR) 30 MG 24 hr tablet Take 30 mg by mouth daily. 09/24/16  Yes [provider]  levothyroxine (SYNTHROID, LEVOTHROID) 112 MCG tablet Take 112 mcg by mouth daily. 09/24/16  Yes [provider]  mometasone-formoterol (DULERA) 100-5 MCG/ACT AERO Inhale 2 puffs into the lungs 2 (two) times daily. 10/25/16  Yes Nyoka CowdenWert, Michael B, MD  montelukast (SINGULAIR) 10 MG tablet Take 10 mg by mouth at bedtime.   Yes [provider]  NEXIUM 40 MG capsule Take 40 mg by mouth daily. 09/24/16  Yes [provider]  nitroGLYCERIN (NITROSTAT) 0.4 MG SL tablet Place 0.4 mg under the tongue every 5 (five) minutes as needed for chest pain.   Yes [provider]  polyvinyl alcohol (LIQUIFILM TEARS) 1.4 % ophthalmic solution Place 2 drops into both eyes as needed for dry eyes (3-4 times weekly).   Yes [provider]  potassium chloride (MICRO-K) 10 MEQ CR capsule Take 20 mEq by mouth 2 (two) times a week. May take up to 60 meq daily    Yes [provider]  famotidine (PEPCID) 20 MG tablet One at bedtime Patient not taking: Reported on 11/04/2016 01/24/15   Nyoka CowdenWert, Michael B, MD  gabapentin (NEURONTIN) 100 MG capsule Take 1 capsule (100 mg total) by mouth 3 (three) times daily. Patient not taking: Reported on 11/04/2016 12/18/15   Vickki HearingHarrison, Stanley E, MD    Family History Family History  Problem Relation Age of Onset  . Cirrhosis Father   . Lung disease Father   . Diabetes Mellitus II Mother   . Heart Problems Mother     Social History Social History  Substance  Use Topics  . Smoking status: Never Smoker  . Smokeless tobacco: Never Used  . Alcohol use 0.0 oz/week     Comment: 1 drink 1 time a year     Allergies   Ivp dye [iodinated diagnostic agents]; Betadine [povidone iodine]; Codeine; Other; Penicillins; and Sulfa antibiotics   Review of Systems Review of Systems 10 Systems reviewed and are negative for acute change except as noted in the HPI.   Physical Exam Updated Vital Signs BP (!) 187/94   Pulse (!) 56   Resp 15   Ht 5\' 3"  (1.6 m)   Wt 60.8 kg (134 lb)   SpO2 97%   BMI 23.74 kg/m   Physical Exam  Constitutional: She is oriented to person, place, and time. She appears well-developed and well-nourished. No distress.  HENT:  Head: Normocephalic and atraumatic.  Nose: Nose normal.  Mouth/Throat: Oropharynx is clear and moist.  Eyes: Conjunctivae and EOM are normal. Pupils are equal, round, and reactive to light.  Neck: Neck supple.  Cardiovascular: Normal rate and regular rhythm.   No murmur heard. Pulmonary/Chest: Effort normal and breath sounds normal. No respiratory distress.  Abdominal: Soft. There is no tenderness.  Musculoskeletal: Normal range of motion. She exhibits no edema or tenderness.  Neurological: She is alert and oriented to person, place, and time. No cranial nerve deficit. She exhibits normal muscle tone. Coordination normal.  Skin: Skin is warm and dry.  Psychiatric: She has a normal mood  and affect.  Nursing note and vitals reviewed.    ED Treatments / Results  Labs (all labs ordered are listed, but only abnormal results are displayed) Labs Reviewed  BASIC METABOLIC PANEL - Abnormal; Notable for the following:       Result Value   Glucose, Bld 113 (*)    GFR calc non Af Amer 59 (*)    All other components within normal limits  CBC  I-STAT TROPOININ, ED    EKG  EKG Interpretation  Date/Time:  Monday November 04 2016 13:15:58 EDT Ventricular Rate:  63 PR Interval:    QRS Duration: 90 QT Interval:  442 QTC Calculation: 453 R Axis:   33 Text Interpretation:  Sinus rhythm Abnormal R-wave progression, early transition Consider left ventricular hypertrophy Borderline T abnormalities, inferior leads no sig change from previous Confirmed by Arby Barrette [phone]) on 11/04/2016 3:02:40 PM       Radiology Dg Chest 2 View  Result Date: 11/04/2016 CLINICAL DATA:  Intermittent chest tightness for several days, hypertension, coronary artery disease post CABG, COPD EXAM: CHEST  2 VIEW COMPARISON:  09/20/2014 FINDINGS: Enlargement of cardiac silhouette post CABG. Atherosclerotic calcification and tortuosity of thoracic aorta. Pulmonary vascularity normal. Linear bibasilar atelectasis. Lungs otherwise clear. No acute infiltrate, pleural effusion or pneumothorax. Diffuse osseous demineralization. IMPRESSION: Enlargement of cardiac silhouette post CABG. Subsegmental atelectasis at both lung bases. Electronically Signed   By: Ulyses Southward M.D.   On: 11/04/2016 14:20    Procedures Procedures (including critical care time) CRITICAL CARE Performed by: Arby Barrette   Total critical care time: 30 minutes  Critical care time was exclusive of separately billable procedures and treating other patients.  Critical care was necessary to treat or prevent imminent or life-threatening deterioration.  Critical care was time spent personally by me on the following activities: development  of treatment plan with patient and/or surrogate as well as nursing, discussions with consultants, evaluation of patient's response to treatment, examination of patient, obtaining history from patient or surrogate, ordering  and performing treatments and interventions, ordering and review of laboratory studies, ordering and review of radiographic studies, pulse oximetry and re-evaluation of patient's condition. Medications Ordered in ED Medications  nitroGLYCERIN (NITROGLYN) 2 % ointment 1 inch (not administered)  labetalol (NORMODYNE,TRANDATE) injection 10 mg (10 mg Intravenous Given 11/04/16 1534)  metoprolol tartrate (LOPRESSOR) tablet 25 mg (25 mg Oral Given 11/04/16 1552)     Initial Impression / Assessment and Plan / ED Course  I have reviewed the triage vital signs and the nursing notes.  Pertinent labs & imaging results that were available during my care of the patient were reviewed by me and considered in my medical decision making (see chart for details).    Consult: [facility] hospitalist for admission  Final Clinical Impressions(s) / ED Diagnoses   Final diagnoses:  Hypertensive urgency  Precordial pain  Acute nonintractable headache, unspecified headache type   Patient has had elevated blood pressures for the past 2 weeks. Pressures now have reached systolic greater than 200 and diastolic high 90s. This is atypical, patient is usually normotensive on medications. Symptoms have worsened over the past several days with the patient now having waxing and waning chest tightness and headache. EKG does not show STEMI pattern our concern for possible ACS. Patient will need admission for rule out MI. Aspirin had been given at urgent care prior to arrival. Patient is given IV labetalol and by mouth metoprolol in the emergency department. She has tolerated this well. Mental status is clear and she does not have acute respiratory distress. New Prescriptions New Prescriptions   No medications on  file     Arby Barrette, MD 11/04/16 [phone]

## 2016-11-04 NOTE — ED Notes (Signed)
ED Provider at bedside. 

## 2016-11-04 NOTE — ED Triage Notes (Signed)
Pt from urgent care with chest pain that started this morning. Pt was given 324mg  ASA and 1 nitro with EMS. Pt rates pain 3-10. Pt has been hypertensive at 202/100

## 2016-11-04 NOTE — ED Notes (Signed)
Patient transported to X-ray 

## 2016-11-04 NOTE — H&P (Addendum)
History and Physical    CAMBER NINH ZOX:[id number] DOB: October 25, 1940 DOA: 11/04/2016  Referring MD/NP/PA:  PCP: Ernestine Conrad, MD- finding new PCP Outpatient Specialists: [name] Patient coming from: home  Chief Complaint: chest pain/headache  HPI: Debbie [name] is a 76 y.o. female with medical history significant of CAD- h/o partial CABG, hypothyroidism, allergies.  She has 2 recent losses in her life, both her son and her family doctor.  Her son died 2 weeks ago and symptoms started shortly after that.  She presents here from the urgent care with chest pain that has been going on for about 2 weeks.  Her BP there was found to be elevated at 202/100.  Pain has been waxing and waning in nature.  Her BP has been elevated at home above her normal.  +nausea but no vomiting.   Incidentally she has been out of her xanax for the last 2 months as her PCP died.   Patient also reports "passing out" 1 week ago when visiting friends.  ED Course: IN the ER, patient was found to have a BP of 200/90s.  EKG with no ST segment elevation.  CE negative.  She was given IV labetalol with improvement of BP.  CT scan pending   Review of Systems: all systems reviewed, negative unless stated above in HPI   Past Medical History:  Diagnosis Date  . Allergic rhinitis   . Anxiety   . Arthritis   . Asthma   . Bladder spasms   . CAD (coronary artery disease)    CABG 2006 [facility]  . Carotid artery disease (HCC)   . Collagen vascular disease (HCC)   . COPD (chronic obstructive pulmonary disease) (HCC)   . Cystocele   . Essential hypertension   . GERD (gastroesophageal reflux disease)   . Hyperlipidemia   . Hypothyroidism   . Hypothyroidism   . Osteoporosis   . Renal insufficiency   . Vaginal vault prolapse     Past Surgical History:  Procedure Laterality Date  . ANTERIOR AND POSTERIOR REPAIR N/A 04/06/2013   Procedure: CYSTOCELE REPAIR ;  Surgeon: Martina Sinner, MD;  Location:  [facility] ORS;  Service: Urology;  Laterality: N/A;  . CATARACTS     REMOVED  . COLONOSCOPY    . CORNEA LACERATION REPAIR    . CORONARY ARTERY BYPASS GRAFT     X 5 VESSELS  . CYSTOSCOPY N/A 04/06/2013   Procedure: CYSTOSCOPY;  Surgeon: Martina Sinner, MD;  Location: [facility] ORS;  Service: Urology;  Laterality: N/A;  . HERNIA REPAIR    . SEPTOPLASTY N/A 04/28/2015   Procedure: SEPTOPLASTY;  Surgeon: Melvenia Beam, MD;  Location: [facility] OR;  Service: ENT;  Laterality: N/A;  . SINUS ENDO W/FUSION Bilateral 04/28/2015   Procedure: ENDOSCOPIC SINUS SURGERY WITH NAVIGATION;  Surgeon: Melvenia Beam, MD;  Location: [facility] OR;  Service: ENT;  Laterality: Bilateral;  . TUBAL LIGATION       reports that she has never smoked. She has never used smokeless tobacco. She reports that she drinks alcohol. She reports that she does not use drugs.  Allergies  Allergen Reactions  . Ivp Dye [Iodinated Diagnostic Agents] Swelling    Kidney Dye  . Betadine [Povidone Iodine] Rash  . Codeine Nausea And Vomiting  . Other Other (See Comments)    ALL NARCOTICS  . Penicillins Rash  . Sulfa Antibiotics Rash    Family History  Problem Relation Age of Onset  . Cirrhosis Father   .  Lung disease Father   . Diabetes Mellitus II Mother   . Heart Problems Mother      Prior to Admission medications   Medication Sig Start Date End Date Taking? Authorizing Provider  aspirin 81 MG tablet Take 81 mg by mouth daily.   Yes [provider]  atorvastatin (LIPITOR) 80 MG tablet Take 80 mg by mouth at bedtime.   Yes [provider]  baclofen (LIORESAL) 10 MG tablet Take 10 mg by mouth daily. 10/31/16  Yes [provider]  Biotin 5000 MCG CAPS Take 5,000 mcg by mouth daily.    Yes [provider]  cetirizine (ZYRTEC) 10 MG tablet Take 10 mg by mouth daily.   Yes [provider]  cholecalciferol (VITAMIN D) 1000 units tablet Take 1,000 Units by mouth daily.   Yes [provider]    esomeprazole (NEXIUM) 20 MG capsule Take x 2 Take 30-60 min before first meal of the day Patient taking differently: Take 20 mg by mouth daily.  01/24/15  Yes Nyoka CowdenWert, Michael B, MD  fluticasone (FLONASE) 50 MCG/ACT nasal spray Place 2 sprays into the nose daily.   Yes [provider]  Fluticasone-Salmeterol (ADVAIR) 250-50 MCG/DOSE AEPB Inhale 1 puff into the lungs every 12 (twelve) hours.   Yes [provider]  furosemide (LASIX) 40 MG tablet Take 40 mg by mouth daily.    Yes [provider]  guaiFENesin (MUCINEX) 600 MG 12 hr tablet Take 1,200 mg by mouth 2 (two) times daily as needed for congestion.   Yes [provider]  Hypromellose (ARTIFICIAL TEARS OP) Place 1 drop into both eyes daily as needed (dry eyes).   Yes [provider]  ibuprofen (ADVIL,MOTRIN) 200 MG tablet Take 1 tablet (200 mg total) by mouth every 8 (eight) hours as needed. Patient taking differently: Take 200 mg by mouth every 8 (eight) hours as needed for moderate pain.  12/18/15  Yes Vickki HearingHarrison, Stanley E, MD  isosorbide mononitrate (IMDUR) 30 MG 24 hr tablet Take 30 mg by mouth daily. 09/24/16  Yes [provider]  levothyroxine (SYNTHROID, LEVOTHROID) 112 MCG tablet Take 112 mcg by mouth daily. 09/24/16  Yes [provider]  mometasone-formoterol (DULERA) 100-5 MCG/ACT AERO Inhale 2 puffs into the lungs 2 (two) times daily. 10/25/16  Yes Nyoka CowdenWert, Michael B, MD  montelukast (SINGULAIR) 10 MG tablet Take 10 mg by mouth at bedtime.   Yes [provider]  NEXIUM 40 MG capsule Take 40 mg by mouth daily. 09/24/16  Yes [provider]  nitroGLYCERIN (NITROSTAT) 0.4 MG SL tablet Place 0.4 mg under the tongue every 5 (five) minutes as needed for chest pain.   Yes [provider]  polyvinyl alcohol (LIQUIFILM TEARS) 1.4 % ophthalmic solution Place 2 drops into both eyes as needed for dry eyes (3-4 times weekly).   Yes [provider]  potassium chloride  (MICRO-K) 10 MEQ CR capsule Take 20 mEq by mouth 2 (two) times a week. May take up to 60 meq daily    Yes [provider]  famotidine (PEPCID) 20 MG tablet One at bedtime Patient not taking: Reported on 11/04/2016 01/24/15   Nyoka CowdenWert, Michael B, MD  gabapentin (NEURONTIN) 100 MG capsule Take 1 capsule (100 mg total) by mouth 3 (three) times daily. Patient not taking: Reported on 11/04/2016 12/18/15   Vickki HearingHarrison, Stanley E, MD    Physical Exam: Vitals:   11/04/16 1445 11/04/16 1515 11/04/16 1545 11/04/16 1551  BP: (!) 212/96 [name](!)  195/93  (!) 187/94  Pulse: 66 67 (!) 57 (!) 56  Resp: 14 18 20 15   SpO2: 99% 100% 98% 97%  Weight:      Height:          Constitutional: NAD, calm, comfortable- tearful at times Vitals:   11/04/16 1445 11/04/16 1515 11/04/16 1545 11/04/16 1551  BP: (!) 212/96 (!) 195/93  (!) 187/94  Pulse: 66 67 (!) 57 (!) 56  Resp: 14 18 20 15   SpO2: 99% 100% 98% 97%  Weight:      Height:       Eyes: PERRL, lids and conjunctivae normal ENMT: Mucous membranes are moist. Posterior pharynx clear of any exudate or lesions.Normal dentition.  Neck: normal, supple, no masses, no thyromegaly Respiratory: rhales b/l Cardiovascular: Regular rate and rhythm, no murmurs / rubs / gallops. No extremity edema. 2+ pedal pulses. No carotid bruits.  Abdomen: no tenderness, no masses palpated. No hepatosplenomegaly. Bowel sounds positive.  Musculoskeletal: no clubbing / cyanosis. No joint deformity upper and lower extremities. Good ROM, no contractures. Normal muscle tone.  Skin: senile changes Neurologic: CN 2-12 grossly intact. Sensation intact, DTR normal. Strength 5/5 in all 4.  Psychiatric: tearful, reserved    Labs on Admission: I have personally reviewed following labs and imaging studies  CBC:  Recent Labs Lab 11/04/16 1316  WBC 6.3  HGB 13.3  HCT 41.9  MCV 86.0  PLT 377   Basic Metabolic Panel:  Recent Labs Lab 11/04/16 1316  NA 138  K 3.6  CL 101  CO2 28    GLUCOSE 113*  BUN 12  CREATININE 0.93  CALCIUM 9.4   GFR: Estimated Creatinine Clearance: 43.2 mL/min (by C-G formula based on SCr of 0.93 mg/dL). Liver Function Tests: No results for input(s): AST, ALT, ALKPHOS, BILITOT, PROT, ALBUMIN in the last 168 hours. No results for input(s): LIPASE, AMYLASE in the last 168 hours. No results for input(s): AMMONIA in the last 168 hours. Coagulation Profile: No results for input(s): INR, PROTIME in the last 168 hours. Cardiac Enzymes: No results for input(s): CKTOTAL, CKMB, CKMBINDEX, TROPONINI in the last 168 hours. BNP (last 3 results) No results for input(s): PROBNP in the last 8760 hours. HbA1C: No results for input(s): HGBA1C in the last 72 hours. CBG: No results for input(s): GLUCAP in the last 168 hours. Lipid Profile: No results for input(s): CHOL, HDL, LDLCALC, TRIG, CHOLHDL, LDLDIRECT in the last 72 hours. Thyroid Function Tests: No results for input(s): TSH, T4TOTAL, FREET4, T3FREE, THYROIDAB in the last 72 hours. Anemia Panel: No results for input(s): VITAMINB12, FOLATE, FERRITIN, TIBC, IRON, RETICCTPCT in the last 72 hours. Urine analysis:    Component Value Date/Time   COLORURINE STRAW (A) 09/20/2014 1653   APPEARANCEUR CLEAR 09/20/2014 1653   LABSPEC 1.010 09/20/2014 1653   PHURINE 5.5 09/20/2014 1653   GLUCOSEU NEGATIVE 09/20/2014 1653   HGBUR NEGATIVE 09/20/2014 1653   BILIRUBINUR NEGATIVE 09/20/2014 1653   KETONESUR NEGATIVE 09/20/2014 1653   PROTEINUR NEGATIVE 09/20/2014 1653   UROBILINOGEN 0.2 09/20/2014 1653   NITRITE NEGATIVE 09/20/2014 1653   LEUKOCYTESUR SMALL (A) 09/20/2014 1653   Sepsis Labs: Invalid input(s): PROCALCITONIN, LACTICIDVEN No results found for this or any previous visit (from the past 240 hour(s)).   Radiological Exams on Admission: Dg Chest 2 View  Result Date: 11/04/2016 CLINICAL DATA:  Intermittent chest tightness for several days, hypertension, coronary artery disease post CABG, COPD  EXAM: CHEST  2 VIEW COMPARISON:  09/20/2014 FINDINGS: Enlargement of cardiac  silhouette post CABG. Atherosclerotic calcification and tortuosity of thoracic aorta. Pulmonary vascularity normal. Linear bibasilar atelectasis. Lungs otherwise clear. No acute infiltrate, pleural effusion or pneumothorax. Diffuse osseous demineralization. IMPRESSION: Enlargement of cardiac silhouette post CABG. Subsegmental atelectasis at both lung bases. Electronically Signed   By: Ulyses Southward M.D.   On: 11/04/2016 14:20    EKG: Independently reviewed. Sinus with borderline T wave abnormalities  Assessment/Plan Active Problems:   CAD (coronary artery disease)   Hypothyroidism   Anxiety   Headache   Bereavement due to life event  Chest pain -cycle CE -echo -ASA given in urgent care -tele -last stress test in 2016 was low risk HTN urgency -resume home meds -PRN added -suspect will improve with treatment of anxiety  Anxiety -has been out of xanax  Bereavement due to death of son  Headache -CT scan pending    DVT prophylaxis: lovenox Code Status: full Family Communication: at bedside Disposition Plan:  Consults called: * Admission status: tele obs   Allanna Bresee Juanetta Gosling DO [facility] Pager [phone]  If 7PM-7AM, please contact night-coverage [email or web address] Password [facility]  11/04/2016, 5:28 PM

## 2016-11-05 ENCOUNTER — Observation Stay (HOSPITAL_BASED_OUTPATIENT_CLINIC_OR_DEPARTMENT_OTHER): Payer: Medicare Other

## 2016-11-05 ENCOUNTER — Observation Stay (HOSPITAL_COMMUNITY): Payer: Medicare Other

## 2016-11-05 DIAGNOSIS — R072 Precordial pain: Secondary | ICD-10-CM | POA: Diagnosis not present

## 2016-11-05 DIAGNOSIS — F4320 Adjustment disorder, unspecified: Secondary | ICD-10-CM | POA: Diagnosis not present

## 2016-11-05 DIAGNOSIS — I340 Nonrheumatic mitral (valve) insufficiency: Secondary | ICD-10-CM

## 2016-11-05 DIAGNOSIS — I160 Hypertensive urgency: Secondary | ICD-10-CM | POA: Diagnosis not present

## 2016-11-05 DIAGNOSIS — R079 Chest pain, unspecified: Secondary | ICD-10-CM | POA: Diagnosis not present

## 2016-11-05 DIAGNOSIS — I2510 Atherosclerotic heart disease of native coronary artery without angina pectoris: Secondary | ICD-10-CM | POA: Diagnosis not present

## 2016-11-05 DIAGNOSIS — I639 Cerebral infarction, unspecified: Secondary | ICD-10-CM | POA: Diagnosis not present

## 2016-11-05 DIAGNOSIS — J449 Chronic obstructive pulmonary disease, unspecified: Secondary | ICD-10-CM | POA: Diagnosis not present

## 2016-11-05 DIAGNOSIS — R51 Headache: Secondary | ICD-10-CM | POA: Diagnosis not present

## 2016-11-05 LAB — LIPID PANEL
Cholesterol: 286 mg/dL — ABNORMAL HIGH (ref 0–200)
HDL: 39 mg/dL — ABNORMAL LOW (ref >40)
LDL Cholesterol: 203 mg/dL — ABNORMAL HIGH (ref 0–99)
Total CHOL/HDL Ratio: 7.3 RATIO
Triglycerides: 222 mg/dL — ABNORMAL HIGH (ref <150)
VLDL: 44 mg/dL — ABNORMAL HIGH (ref 0–40)

## 2016-11-05 LAB — TROPONIN I
Troponin I: <0.03 ng/mL (ref <0.03)
Troponin I: <0.03 ng/mL (ref <0.03)

## 2016-11-05 MED ORDER — ALPRAZOLAM 0.25 MG PO TABS: 0.2500 mg | ORAL_TABLET | Freq: Two times a day (BID) | ORAL | Status: DC | PRN

## 2016-11-05 MED ORDER — METOPROLOL TARTRATE 12.5 MG HALF TABLET
12.5000 mg | ORAL_TABLET | Freq: Two times a day (BID) | ORAL | Status: DC
  Administered 2016-11-05 – 2016-11-06 (×2): 12.5 mg via ORAL
  Filled 2016-11-05 (×2): qty 1

## 2016-11-05 NOTE — Care Management Obs Status (Signed)
MEDICARE OBSERVATION STATUS NOTIFICATION   Patient Details  Name: Debbie [name] MRN: [id number] Date of Birth: 09-24-1940   Medicare Observation Status Notification Given:  Yes    Lawerance Sabalebbie Jahlen Bollman, RN 11/05/2016, 2:03 PM

## 2016-11-05 NOTE — Progress Notes (Signed)
PROGRESS NOTE    Debbie [name]  NWG:[id number] DOB: Oct 21, 1940 DOA: 11/04/2016 PCP: Ernestine Conrad, MD   Outpatient Specialists:     Brief Narrative:  Debbie [name] is a 76 y.o. female with medical history significant of CAD- h/o partial CABG, hypothyroidism, allergies.  She has 2 recent losses in her life, both her son and her family doctor.  Her son died 2 weeks ago and symptoms started shortly after that.  She presents here from the urgent care with chest pain that has been going on for about 2 weeks.  Her BP there was found to be elevated at 202/100.  Pain has been waxing and waning in nature.  Her BP has been elevated at home above her normal.  +nausea but no vomiting.   Incidentally she has been out of her xanax for the last 2 months as her PCP died.   Patient also reports "passing out" 1 week ago when visiting friends.   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 CVA was seen on CT scan in ER-- I was able to acquire records from CT scan in April where the possible CVAs were not mentioned -no needs for SLP/PT/OT eval as patient is ambulating in room on own  Hypothyroid -check TSH -continue synthroid  DVT prophylaxis:  Lovenox   Code Status: Full Code   Family Communication: At bedside  Disposition Plan:  Home after echo/MRI   Consultants:      Subjective: Headache from this AM resolved  Objective: Vitals:   11/04/16 2300 11/05/16 0556  11/05/16 0847 11/05/16 1018  BP:  (!) 111/58  130/64  Pulse:  (!) 58  62  Resp:  18  18  Temp: 98.3 F (36.8 C) 97.9 F (36.6 C)    TempSrc: Oral Oral    SpO2:  95% 96% 96%  Weight:  60.5 kg (133 lb 6.4 oz)    Height:       No intake or output data in the 24 hours ending 11/05/16 1437 Filed Weights   11/04/16 1308 11/04/16 2037 11/05/16 0556  Weight: 60.8 kg (134 lb) 61.3 kg (135 lb 1.6 oz) 60.5 kg (133 lb 6.4 oz)    Examination:  General exam: resting in bed- flat affect Respiratory system: no wheezing, diminished breath sounds Cardiovascular system: rrr Gastrointestinal system: +BS, soft Central nervous system: A+Ox3. Extremities: Symmetric 5 x 5 power.     Data Reviewed: I have personally reviewed following labs and imaging studies  CBC:  Recent Labs Lab 11/04/16 1316  WBC 6.3  HGB 13.3  HCT 41.9  MCV 86.0  PLT 377   Basic Metabolic Panel:  Recent Labs Lab 11/04/16 1316  NA 138  K 3.6  CL 101  CO2 28  GLUCOSE 113*  BUN 12  CREATININE 0.93  CALCIUM 9.4   GFR: Estimated Creatinine Clearance: 43.2 mL/min (by C-G formula based on SCr of 0.93 mg/dL). Liver Function  Tests: No results for input(s): AST, ALT, ALKPHOS, BILITOT, PROT, ALBUMIN in the last 168 hours. No results for input(s): LIPASE, AMYLASE in the last 168 hours. No results for input(s): AMMONIA in the last 168 hours. Coagulation Profile: No results for input(s): INR, PROTIME in the last 168 hours. Cardiac Enzymes:  Recent Labs Lab 11/04/16 2114 11/04/16 2340 11/05/16 0246  TROPONINI <0.03 <0.03 <0.03   BNP (last 3 results) No results for input(s): PROBNP in the last 8760 hours. HbA1C: No results for input(s): HGBA1C in the last 72 hours. CBG: No results for input(s): GLUCAP in the last 168 hours. Lipid Profile:  Recent Labs  11/05/16 1055  CHOL 286*  HDL 39*  LDLCALC 203*  TRIG 222*  CHOLHDL 7.3   Thyroid Function Tests: No results for input(s): TSH, T4TOTAL, FREET4,  T3FREE, THYROIDAB in the last 72 hours. Anemia Panel: No results for input(s): VITAMINB12, FOLATE, FERRITIN, TIBC, IRON, RETICCTPCT in the last 72 hours. Urine analysis:    Component Value Date/Time   COLORURINE STRAW (A) 09/20/2014 1653   APPEARANCEUR CLEAR 09/20/2014 1653   LABSPEC 1.010 09/20/2014 1653   PHURINE 5.5 09/20/2014 1653   GLUCOSEU NEGATIVE 09/20/2014 1653   HGBUR NEGATIVE 09/20/2014 1653   BILIRUBINUR NEGATIVE 09/20/2014 1653   KETONESUR NEGATIVE 09/20/2014 1653   PROTEINUR NEGATIVE 09/20/2014 1653   UROBILINOGEN 0.2 09/20/2014 1653   NITRITE NEGATIVE 09/20/2014 1653   LEUKOCYTESUR SMALL (A) 09/20/2014 1653    )No results found for this or any previous visit (from the past 240 hour(s)).    Anti-infectives    None       Radiology Studies: Dg Chest 2 View  Result Date: 11/04/2016 CLINICAL DATA:  Intermittent chest tightness for several days, hypertension, coronary artery disease post CABG, COPD EXAM: CHEST  2 VIEW COMPARISON:  09/20/2014 FINDINGS: Enlargement of cardiac silhouette post CABG. Atherosclerotic calcification and tortuosity of thoracic aorta. Pulmonary vascularity normal. Linear bibasilar atelectasis. Lungs otherwise clear. No acute infiltrate, pleural effusion or pneumothorax. Diffuse osseous demineralization. IMPRESSION: Enlargement of cardiac silhouette post CABG. Subsegmental atelectasis at both lung bases. Electronically Signed   By: Ulyses SouthwardMark  Boles M.D.   On: 11/04/2016 14:20   Ct Head Wo Contrast  Result Date: 11/04/2016 CLINICAL DATA:  Headache, chest pain, elevated blood pressure, history COPD, essential hypertension, coronary artery disease post CABG, carotid artery disease, collagen vascular disease EXAM: CT HEAD WITHOUT CONTRAST TECHNIQUE: Contiguous axial images were obtained from the base of the skull through the vertex without intravenous contrast. Sagittal and coronal MPR images reconstructed from axial data set. COMPARISON:  None available;  prior studies from 2015 do not load in PACs for comparison FINDINGS: Brain: Mild atrophy. Normal ventricular morphology. No midline shift or mass effect. Minimal small vessel chronic ischemic changes of deep cerebral white matter. Tiny lacunar infarcts LEFT thalamus and LEFT basal ganglia, likely old. No intracranial hemorrhage, mass lesion or evidence of acute infarction. No extra-axial fluid collections. Vascular: Minimal atherosclerotic calcifications at the carotid siphons Skull: Intact Sinuses/Orbits: Prior sinus surgery. No sinus opacification. Mastoid air cells clear. Other: N/A IMPRESSION: Atrophy with minimal small vessel chronic ischemic changes of deep cerebral white matter. Tiny probably old lacunar infarcts at the LEFT basal ganglia and LEFT thalamus. No acute intracranial abnormalities. Electronically Signed   By: Ulyses SouthwardMark  Boles M.D.   On: 11/04/2016 17:42        Scheduled Meds: . aspirin  81 mg Oral Daily  . atorvastatin  80 mg Oral QHS  . baclofen  10 mg Oral Daily  . cholecalciferol  1,000 Units Oral Daily  . enoxaparin (LOVENOX) injection  40 mg Subcutaneous Q24H  . furosemide  40 mg Oral Daily  . isosorbide mononitrate  30 mg Oral Daily  . levothyroxine  112 mcg Oral Daily  . loratadine  10 mg Oral Daily  . metoprolol tartrate  12.5 mg Oral BID  . mometasone-formoterol  2 puff Inhalation BID  . montelukast  10 mg Oral QHS  . pantoprazole  40 mg Oral Daily  . potassium chloride  10 mEq Oral Daily   Continuous Infusions:   LOS: 0 days    Time spent: 35 min    Danitza Schoenfeldt U Jace Fermin, DO [facility] Pager [phone]  If 7PM-7AM, please contact night-coverage [email or web address] Password [facility] 11/05/2016, 2:37 PM

## 2016-11-05 NOTE — Progress Notes (Signed)
  Echocardiogram 2D Echocardiogram has been performed.  Delcie RochENNINGTON, Meghana Tullo 11/05/2016, 5:14 PM

## 2016-11-05 NOTE — Plan of Care (Signed)
Problem: Activity: Goal: Risk for activity intolerance will decrease Outcome: Completed/Met Date Met: 11/05/16 Ambulating in room without difficulty   

## 2016-11-06 ENCOUNTER — Encounter (HOSPITAL_COMMUNITY): Payer: Medicare Other

## 2016-11-06 ENCOUNTER — Observation Stay (HOSPITAL_BASED_OUTPATIENT_CLINIC_OR_DEPARTMENT_OTHER): Payer: Medicare Other

## 2016-11-06 DIAGNOSIS — R072 Precordial pain: Secondary | ICD-10-CM | POA: Diagnosis not present

## 2016-11-06 DIAGNOSIS — F419 Anxiety disorder, unspecified: Secondary | ICD-10-CM | POA: Diagnosis not present

## 2016-11-06 DIAGNOSIS — R55 Syncope and collapse: Secondary | ICD-10-CM

## 2016-11-06 DIAGNOSIS — R079 Chest pain, unspecified: Secondary | ICD-10-CM | POA: Diagnosis not present

## 2016-11-06 DIAGNOSIS — F4320 Adjustment disorder, unspecified: Secondary | ICD-10-CM | POA: Diagnosis not present

## 2016-11-06 DIAGNOSIS — I639 Cerebral infarction, unspecified: Secondary | ICD-10-CM

## 2016-11-06 DIAGNOSIS — I160 Hypertensive urgency: Secondary | ICD-10-CM | POA: Diagnosis not present

## 2016-11-06 LAB — VAS US CAROTID
LEFT ECA DIAS: -2 cm/s
LEFT VERTEBRAL DIAS: -12 cm/s
Left CCA dist dias: -16 cm/s
Left CCA dist sys: -80 cm/s
Left CCA prox dias: 16 cm/s
Left CCA prox sys: 66 cm/s
Left ICA dist dias: -16 cm/s
Left ICA dist sys: -45 cm/s
Left ICA prox dias: -24 cm/s
Left ICA prox sys: -62 cm/s
RIGHT ECA DIAS: -2 cm/s
RIGHT VERTEBRAL DIAS: -16 cm/s
Right CCA prox dias: -14 cm/s
Right CCA prox sys: -62 cm/s
Right cca dist sys: -65 cm/s

## 2016-11-06 LAB — ECHOCARDIOGRAM COMPLETE
E decel time: 391 msec
E/e' ratio: 9.25
FS: 33 % (ref 28–44)
Height: 63 in
IVS/LV PW RATIO, ED: 0.9
LA ID, A-P, ES: 45 mm
LA diam end sys: 45 mm
LA diam index: 2.76 cm/m2
LA vol A4C: 62.7 ml
LA vol index: 37.3 mL/m2
LA vol: 60.8 mL
LV E/e' medial: 9.25
LV E/e'average: 9.25
LV PW d: 10 mm — AB (ref 0.6–1.1)
LV e' LATERAL: 8.81 cm/s
LVOT SV: 60 mL
LVOT VTI: 26.4 cm
LVOT area: 2.27 cm2
LVOT diameter: 17 mm
LVOT peak grad rest: 3 mmHg
LVOT peak vel: 93.1 cm/s
Lateral S' vel: 10.8 cm/s
MV Dec: 391
MV Peak grad: 3 mmHg
MV pk A vel: 66.9 m/s
MV pk E vel: 81.5 m/s
RV sys press: 22 mmHg
Reg peak vel: 217 cm/s
TAPSE: 17.9 mm
TDI e' lateral: 8.81
TDI e' medial: 5.87
TR max vel: 217 cm/s
Weight: 2134.4 oz

## 2016-11-06 LAB — LIPID PANEL
Cholesterol: 276 mg/dL — ABNORMAL HIGH (ref 0–200)
HDL: 40 mg/dL — ABNORMAL LOW (ref >40)
LDL Cholesterol: 186 mg/dL — ABNORMAL HIGH (ref 0–99)
Total CHOL/HDL Ratio: 6.9 RATIO
Triglycerides: 250 mg/dL — ABNORMAL HIGH (ref <150)
VLDL: 50 mg/dL — ABNORMAL HIGH (ref 0–40)

## 2016-11-06 LAB — TSH: TSH: 5.292 u[IU]/mL — ABNORMAL HIGH (ref 0.350–4.500)

## 2016-11-06 MED ORDER — ALPRAZOLAM 0.25 MG PO TABS: 0.2500 mg | ORAL_TABLET | Freq: Two times a day (BID) | ORAL | 0 refills | Status: DC | PRN

## 2016-11-06 MED ORDER — LISINOPRIL 20 MG PO TABS: 20.0000 mg | ORAL_TABLET | Freq: Every day | ORAL | 0 refills | Status: DC

## 2016-11-06 MED ORDER — AMLODIPINE BESYLATE 5 MG PO TABS: 5.0000 mg | ORAL_TABLET | Freq: Every day | ORAL | Status: DC

## 2016-11-06 MED ORDER — LISINOPRIL 20 MG PO TABS: 20.0000 mg | ORAL_TABLET | Freq: Every day | ORAL | Status: DC

## 2016-11-06 MED ORDER — AMLODIPINE BESYLATE 5 MG PO TABS: 5.0000 mg | ORAL_TABLET | Freq: Every day | ORAL | 0 refills | Status: DC

## 2016-11-06 NOTE — Progress Notes (Signed)
   11/06/16 1020  Clinical Encounter Type  Visited With Patient and family together  Visit Type Other (Comment) ([facility] consult)  Spiritual Encounters  Spiritual Needs Emotional  Stress Factors  Patient Stress Factors Health changes  Family Stress Factors None identified  Introduction to Pt and friend. Pt reports coping and has no needs.

## 2016-11-06 NOTE — Telephone Encounter (Signed)
Attempted to follow up again.  Left detailed message to give us a call back if she needed any further assistance with establishing with pmd.

## 2016-11-06 NOTE — Progress Notes (Signed)
VASCULAR LAB PRELIMINARY  PRELIMINARY  PRELIMINARY  PRELIMINARY  Carotid duplex completed.    Preliminary report:  Bilateral:  1-39% ICA stenosis.  Vertebral artery flow is antegrade.     Mariachristina Holle, RVS 11/06/2016, 4:15 PM

## 2016-11-06 NOTE — Discharge Summary (Addendum)
Physician Discharge Summary  Debbie [name] C Blodgett VHQ:[id number] DOB: 1941/01/23 DOA: 11/04/2016  PCP: Ernestine ConradBluth, Kirk, MD  Admit date: 11/04/2016 Discharge date: 11/06/2016   Recommendations for Outpatient Follow-Up:   1. Close monitoring of BP-- medications will need to be titrated 2. BMP 1 week 3. Consider outpatient event monitor for 30 days 4. TSH 6 weeks   Discharge Diagnosis:   Active Problems:   CAD (coronary artery disease)   Hypothyroidism   Anxiety   Chest pain   Headache   Bereavement due to life event   Discharge disposition:  Home:  Discharge Condition: Improved.  Diet recommendation: Low sodium, heart healthy.  Wound care: None.   History of Present Illness:   Debbie [name] C Debbie [name] is a 76 y.o. female with medical history significant of CAD- h/o partial CABG, hypothyroidism, allergies.  She has 2 recent losses in her life, both her son and her family doctor.  Her son died 2 weeks ago and symptoms started shortly after that.  She presents here from the urgent care with chest pain that has been going on for about 2 weeks.  Her BP there was found to be elevated at 202/100.  Pain has been waxing and waning in nature.  Her BP has been elevated at home above her normal.  +nausea but no vomiting.   Incidentally she has been out of her xanax for the last 2 months as her PCP died.   Patient also reports "passing out" 1 week ago when visiting friends.   Hospital Course by Problem:   Chest pain -CE negative -echo:Left ventricle: The cavity size was normal. There was mild focal   basal hypertrophy of the septum. Systolic function was normal.   The estimated ejection fraction was in the range of 60% to 65%.   Wall motion was normal; there were no regional wall motion   abnormalities. Features are consistent with a pseudonormal left   ventricular filling pattern, with concomitant abnormal relaxation   and increased filling pressure (grade 2 diastolic dysfunction).  Doppler parameters are consistent with indeterminate ventricular   filling pressure. 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 If patient was taking medications -continue synthroid Check TSH in 6 weeks    Medical Consultants:    None.   Discharge Exam:   Vitals:   11/06/16 0947 11/06/16 1259  BP: (!) 162/78   Pulse: 61   Resp:    Temp:  98.6 F (37 C)   Vitals:   11/05/16 1920 11/06/16 0400 11/06/16 0947 11/06/16 1259  BP: (!) 132/52  (!) 162/78   Pulse: 72 (!) 58 61   Resp: 18 18    Temp: 98.3 F (36.8 C)   98.6 F (37 C)  TempSrc: Oral   Oral  SpO2: 95% 95%    Weight:  57.2 kg (126 lb 1.6 oz)    Height:        Gen:  NAD- tearful at times   The results of  significant diagnostics from this hospitalization (including imaging, microbiology, ancillary and laboratory) are listed below for reference.     Procedures and Diagnostic Studies:   Dg Chest 2 View  Result Date: 11/04/2016 CLINICAL DATA:  Intermittent chest tightness for several days, hypertension, coronary artery disease post CABG, COPD EXAM: CHEST  2 VIEW COMPARISON:  09/20/2014 FINDINGS: Enlargement of cardiac silhouette post CABG. Atherosclerotic calcification and tortuosity of thoracic aorta. Pulmonary vascularity normal. Linear bibasilar atelectasis. Lungs otherwise clear. No acute infiltrate, pleural effusion or pneumothorax. Diffuse osseous demineralization. IMPRESSION: Enlargement of  cardiac silhouette post CABG. Subsegmental atelectasis at both lung bases. Electronically Signed   By: Ulyses Southward M.D.   On: 11/04/2016 14:20   Ct Head Wo Contrast  Result Date: 11/04/2016 CLINICAL DATA:  Headache, chest pain, elevated blood pressure, history COPD, essential hypertension, coronary artery disease post CABG, carotid artery disease, collagen vascular disease EXAM: CT HEAD WITHOUT CONTRAST TECHNIQUE: Contiguous axial images were obtained from the base of the skull through the vertex without intravenous contrast. Sagittal and coronal MPR images reconstructed from axial data set. COMPARISON:  None available; prior studies from 2015 do not load in PACs for comparison FINDINGS: Brain: Mild atrophy. Normal ventricular morphology. No midline shift or mass effect. Minimal small vessel chronic ischemic changes of deep cerebral white matter. Tiny lacunar infarcts LEFT thalamus and LEFT basal ganglia, likely old. No intracranial hemorrhage, mass lesion or evidence of acute infarction. No extra-axial fluid collections. Vascular: Minimal atherosclerotic calcifications at the carotid siphons Skull: Intact Sinuses/Orbits: Prior sinus surgery. No sinus opacification. Mastoid air cells clear. Other: N/A IMPRESSION: Atrophy with minimal small vessel chronic ischemic changes of deep cerebral white matter. Tiny probably old lacunar infarcts at the LEFT basal ganglia and LEFT thalamus. No acute intracranial abnormalities. Electronically Signed   By: Ulyses Southward M.D.   On: 11/04/2016 17:42   Mr Brain Wo Contrast  Result Date: 11/05/2016 CLINICAL DATA:  Initial evaluation for headache, evaluate for stroke. EXAM: MRI HEAD WITHOUT CONTRAST TECHNIQUE: Multiplanar, multiecho pulse sequences of the brain and surrounding structures were obtained without intravenous contrast. COMPARISON:  Prior CT from 11/04/2016. FINDINGS: Brain: Age-appropriate cerebral atrophy. Mild patchy T2/FLAIR hyperintensity within the  periventricular and deep white matter both cerebral hemispheres, most consistent with chronic small vessel ischemic disease. Scatter remote lacunar infarcts present within the left basal ganglia and left thalamus. No abnormal foci of restricted diffusion to suggest acute or subacute ischemia. Gray-white matter differentiation maintained. No other areas of remote cortical infarction. Single punctate chronic microhemorrhage noted within the right cerebellar hemisphere, doubtful significance in isolation. No other evidence for acute or chronic intracranial hemorrhage. No mass lesion, midline shift or mass effect. Ventricles normal size without evidence for hydrocephalus. No extra-axial fluid collection. Major dural sinuses grossly patent. Pituitary gland suprasellar region within normal limits. Midline structures intact and normal. Vascular: Major intracranial vascular flow voids are maintained. Skull and upper cervical spine: Craniocervical junction normal. Bone marrow signal intensity within normal limits. No scalp soft tissue abnormality. Sinuses/Orbits: Globes and oval soft tissues within normal limits. Patient status post lens extraction bilaterally. Chronic mucosal thickening at the left maxillary sinus. Patient appears to be status post sinus surgery. No evidence for acute sinusitis. No mastoid effusion. Inner ear structures normal. IMPRESSION: 1. No acute intracranial infarct or other process identified. 2. Small remote lacunar infarcts involving the left basal ganglia and left thalamus. 3. 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 Electronically Signed   By: Janell Quiet.D.  On: 11/05/2016 23:12     Labs:   Basic Metabolic Panel:  Recent Labs Lab 11/04/16 1316  NA 138  K 3.6  CL 101  CO2 28  GLUCOSE 113*  BUN 12  CREATININE 0.93  CALCIUM 9.4   GFR Estimated Creatinine Clearance: 43.2 mL/min (by C-G formula based on SCr of 0.93 mg/dL). Liver Function Tests: No results  for input(s): AST, ALT, ALKPHOS, BILITOT, PROT, ALBUMIN in the last 168 hours. No results for input(s): LIPASE, AMYLASE in the last 168 hours. No results for input(s): AMMONIA in the last 168 hours. Coagulation profile No results for input(s): INR, PROTIME in the last 168 hours.  CBC:  Recent Labs Lab 11/04/16 1316  WBC 6.3  HGB 13.3  HCT 41.9  MCV 86.0  PLT 377   Cardiac Enzymes:  Recent Labs Lab 11/04/16 2114 11/04/16 2340 11/05/16 0246  TROPONINI <0.03 <0.03 <0.03   BNP: Invalid input(s): POCBNP CBG: No results for input(s): GLUCAP in the last 168 hours. D-Dimer No results for input(s): DDIMER in the last 72 hours. Hgb A1c No results for input(s): HGBA1C in the last 72 hours. Lipid Profile  Recent Labs  11/05/16 1055 11/06/16 0512  CHOL 286* 276*  HDL 39* 40*  LDLCALC 203* 161*  TRIG 222* 250*  CHOLHDL 7.3 6.9   Thyroid function studies  Recent Labs  11/06/16 0512  TSH 5.292*   Anemia work up No results for input(s): VITAMINB12, FOLATE, FERRITIN, TIBC, IRON, RETICCTPCT in the last 72 hours. Microbiology No results found for this or any previous visit (from the past 240 hour(s)).   Discharge Instructions:   Discharge Instructions    Diet - low sodium heart healthy    Complete by:  As directed    Increase activity slowly    Complete by:  As directed      Allergies as of 11/06/2016      Reactions   Ivp Dye [iodinated Diagnostic Agents] Swelling   Kidney Dye   Betadine [povidone Iodine] Rash   Codeine Nausea And Vomiting   Other Other (See Comments)   ALL NARCOTICS   Penicillins Rash   Sulfa Antibiotics Rash      Medication List    STOP taking these medications   famotidine 20 MG tablet Commonly known as:  PEPCID   gabapentin 100 MG capsule Commonly known as:  NEURONTIN   mometasone-formoterol 100-5 MCG/ACT Aero Commonly known as:  DULERA     TAKE these medications   ALPRAZolam 0.25 MG tablet Commonly known as:  XANAX Take  1 tablet (0.25 mg total) by mouth 2 (two) times daily as needed for anxiety.   amLODipine 5 MG tablet Commonly known as:  NORVASC Take 1 tablet (5 mg total) by mouth daily.   ARTIFICIAL TEARS OP Place 1 drop into both eyes daily as needed (dry eyes).   aspirin 81 MG tablet Take 81 mg by mouth daily.   atorvastatin 80 MG tablet Commonly known as:  LIPITOR Take 80 mg by mouth at bedtime.   baclofen 10 MG tablet Commonly known as:  LIORESAL Take 10 mg by mouth daily.   Biotin 5000 MCG Caps Take 5,000 mcg by mouth daily.   cetirizine 10 MG tablet Commonly known as:  ZYRTEC Take 10 mg by mouth daily.   cholecalciferol 1000 units tablet Commonly known as:  VITAMIN D Take 1,000 Units by mouth daily.   fluticasone 50 MCG/ACT nasal spray Commonly known as:  FLONASE Place 2 sprays into the  nose daily.   Fluticasone-Salmeterol 250-50 MCG/DOSE Aepb Commonly known as:  ADVAIR Inhale 1 puff into the lungs every 12 (twelve) hours.   furosemide 40 MG tablet Commonly known as:  LASIX Take 40 mg by mouth daily.   guaiFENesin 600 MG 12 hr tablet Commonly known as:  MUCINEX Take 1,200 mg by mouth 2 (two) times daily as needed for congestion.   ibuprofen 200 MG tablet Commonly known as:  ADVIL,MOTRIN Take 1 tablet (200 mg total) by mouth every 8 (eight) hours as needed. What changed:  reasons to take this   isosorbide mononitrate 30 MG 24 hr tablet Commonly known as:  IMDUR Take 30 mg by mouth daily.   levothyroxine 112 MCG tablet Commonly known as:  SYNTHROID, LEVOTHROID Take 112 mcg by mouth daily.   lisinopril 20 MG tablet Commonly known as:  PRINIVIL,ZESTRIL Take 1 tablet (20 mg total) by mouth daily.   montelukast 10 MG tablet Commonly known as:  SINGULAIR Take 10 mg by mouth at bedtime.   NEXIUM 40 MG capsule Generic drug:  esomeprazole Take 40 mg by mouth daily. What changed:  Another medication with the same name was removed. Continue taking this medication,  and follow the directions you see here.   nitroGLYCERIN 0.4 MG SL tablet Commonly known as:  NITROSTAT Place 0.4 mg under the tongue every 5 (five) minutes as needed for chest pain.   polyvinyl alcohol 1.4 % ophthalmic solution Commonly known as:  LIQUIFILM TEARS Place 2 drops into both eyes as needed for dry eyes (3-4 times weekly).   potassium chloride 10 MEQ CR capsule Commonly known as:  MICRO-K Take 20 mEq by mouth 2 (two) times a week. May take up to 60 meq daily      Follow-up Information    patient to establish with new PCP for BP checks Follow up.            Time coordinating discharge: 25 min  Signed:  Shawnae Leiva U Ariza Evans   [facility] 11/06/2016, 3:57 PM

## 2016-11-07 LAB — HEMOGLOBIN A1C
Hgb A1c MFr Bld: 6.1 % — ABNORMAL HIGH (ref 4.8–5.6)
Mean Plasma Glucose: 128 mg/dL

## 2016-11-19 ENCOUNTER — Ambulatory Visit: Payer: Medicare Other | Admitting: Internal Medicine

## 2016-12-17 NOTE — Progress Notes (Signed)
Cardiology Office Note  Date: 12/18/2016   ID: Debbie [name], DOB 11-27-1940, MRN [id number]  PCP: Kirstie PeriShah, Ashish, MD  Primary Cardiologist: Nona DellSamuel McDowell, MD   Chief Complaint  Patient presents with  . Coronary Artery Disease    History of Present Illness: Debbie [name] is a 76 y.o. female last seen by Ms. Liborio NixonLawrence DNP in March. She presents for a follow-up visit today. She has established with Dr. Sherryll BurgerShah for primary care. She tells me that her son passed away due to cancer back in May. She is still dealing with grief but states that she has good support from friends. She does not report any angina symptoms. She states that she walks outdoors up to 2 miles at a time most days of the week when the weather permits.  She was admitted to the hospital in June with hypertensive urgency and chest pain. She ruled out for ACS and was managed medically. Follow-up echocardiogram and carotid Dopplers from June are outlined below. I reviewed these with her today.  Current cardiac regimen includes aspirin, Norvasc, Lipitor, Lasix, Imdur, lisinopril, and potassium supplements. She has not had to use any sublingual nitroglycerin.  Past Medical History:  Diagnosis Date  . Allergic rhinitis   . Anxiety   . Arthritis   . Asthma   . Bladder spasms   . CAD (coronary artery disease)    [facility] [place]  . Carotid artery disease (HCC)   . Collagen vascular disease (HCC)   . COPD (chronic obstructive pulmonary disease) (HCC)   . Cystocele   . Essential hypertension   . GERD (gastroesophageal reflux disease)   . Hyperlipidemia   . Hypothyroidism   . Hypothyroidism   . Osteoporosis   . Renal insufficiency   . Vaginal vault prolapse     Past Surgical History:  Procedure Laterality Date  . ANTERIOR AND POSTERIOR REPAIR N/A 04/06/2013   Procedure: CYSTOCELE REPAIR ;  Surgeon: Martina SinnerScott A MacDiarmid, MD;  Location: [facility];  Service: Urology;  Laterality: N/A;  . CATARACTS     REMOVED  . COLONOSCOPY    . CORNEA LACERATION REPAIR    . CORONARY ARTERY BYPASS GRAFT     X 5 VESSELS  . CYSTOSCOPY N/A 04/06/2013   Procedure: CYSTOSCOPY;  Surgeon: Martina SinnerScott A MacDiarmid, MD;  Location: [facility];  Service: Urology;  Laterality: N/A;  . HERNIA REPAIR    . SEPTOPLASTY N/A 04/28/2015   Procedure: SEPTOPLASTY;  Surgeon: Melvenia BeamMitchell Gore, MD;  [facility];  Service: ENT;  Laterality: N/A;  . SINUS ENDO W/FUSION Bilateral 04/28/2015   Procedure: ENDOSCOPIC SINUS SURGERY WITH NAVIGATION;  Surgeon: Melvenia BeamMitchell Gore, MD;  [facility];  Service: ENT;  Laterality: Bilateral;  . TUBAL LIGATION      Current Outpatient Prescriptions  Medication Sig Dispense Refill  . ALPRAZolam (XANAX) 0.25 MG tablet Take 1 tablet (0.25 mg total) by mouth 2 (two) times daily as needed for anxiety. 30 tablet 0  . amLODipine (NORVASC) 5 MG tablet Take 1 tablet (5 mg total) by mouth daily. 30 tablet 0  . aspirin 81 MG tablet Take 81 mg by mouth daily.    [name]. atorvastatin (LIPITOR) 80 MG tablet Take 80 mg by mouth at bedtime.    . baclofen (LIORESAL) 10 MG tablet Take 10 mg by mouth daily.    . Biotin 5000 MCG CAPS Take 5,000 mcg by mouth daily.     . cetirizine (ZYRTEC) 10 MG tablet Take 10 mg  by mouth daily as needed.     . cholecalciferol (VITAMIN D) 1000 units tablet Take 1,000 Units by mouth daily.    . fluticasone (FLONASE) 50 MCG/ACT nasal spray Place 2 sprays into the nose daily.    . Fluticasone-Salmeterol (ADVAIR) 250-50 MCG/DOSE AEPB Inhale 1 puff into the lungs every 12 (twelve) hours.    . furosemide (LASIX) 40 MG tablet Take 60 mg by mouth daily.     [name]. guaiFENesin (MUCINEX) 600 MG 12 hr tablet Take 1,200 mg by mouth 2 (two) times daily as needed for congestion.    . Hypromellose (ARTIFICIAL TEARS OP) Place 1 drop into both eyes daily as needed (dry eyes).    [name]. ibuprofen (ADVIL,MOTRIN) 200 MG tablet Take 1 tablet (200 mg total) by mouth every 8 (eight) hours as needed. (Patient taking  differently: Take 200 mg by mouth every 8 (eight) hours as needed for moderate pain. ) 90 tablet 5  . isosorbide mononitrate (IMDUR) 30 MG 24 hr tablet Take 30 mg by mouth daily.    [name]. levothyroxine (SYNTHROID, LEVOTHROID) 112 MCG tablet Take 112 mcg by mouth daily.    [name]. lisinopril (PRINIVIL,ZESTRIL) 20 MG tablet Take 1 tablet (20 mg total) by mouth daily. 30 tablet 0  . montelukast (SINGULAIR) 10 MG tablet Take 10 mg by mouth at bedtime.    [name]. NEXIUM 40 MG capsule Take 40 mg by mouth daily.    . nitroGLYCERIN (NITROSTAT) 0.4 MG SL tablet Place 0.4 mg under the tongue every 5 (five) minutes as needed for chest pain.    . polyvinyl alcohol (LIQUIFILM TEARS) 1.4 % ophthalmic solution Place 2 drops into both eyes as needed for dry eyes (3-4 times weekly).    . potassium chloride (MICRO-K) 10 MEQ CR capsule Take 20 mEq by mouth 2 (two) times daily.      No current facility-administered medications for this visit.    Allergies:  Ivp dye [iodinated diagnostic agents]; Betadine [povidone iodine]; Codeine; Other; Penicillins; and Sulfa antibiotics   Social History: The patient  reports that she has never smoked. She has never used smokeless tobacco. She reports that she drinks alcohol. She reports that she does not use drugs.   ROS:  Please see the history of present illness. Otherwise, complete review of systems is positive for none.  All other systems are reviewed and negative.   Physical Exam: VS:  BP (!) 148/82   Pulse 69   Ht 5\' 2"  (1.575 m)   Wt 135 lb 12.8 oz (61.6 kg)   SpO2 94%   BMI 24.84 kg/m , BMI Body mass index is 24.84 kg/m.  Wt Readings from Last 3 Encounters:  12/18/16 135 lb 12.8 oz (61.6 kg)  11/06/16 126 lb 1.6 oz (57.2 kg)  07/18/16 134 lb (60.8 kg)    General: Patient appears comfortable at rest. HEENT: Conjunctiva and lids normal, oropharynx clear. Neck: Supple, no elevated JVP, right carotid bruit, no thyromegaly. Lungs: Decreased breath sounds without wheezing,  nonlabored breathing at rest. Cardiac: Regular rate and rhythm, no S3, soft systolic murmur, no pericardial rub. Abdomen: Soft, nontender, bowel sounds present. Extremities: No pitting edema, distal pulses 2+. Skin: Warm and dry. Musculoskeletal: No kyphosis. Neuropsychiatric: Alert and oriented x3, affect grossly appropriate.  ECG: I personally reviewed the tracing from 11/04/2008 which showed sinus rhythm with counterclockwise rotation and increased voltage.  Recent Labwork: 11/04/2016: BUN 12; Creatinine, Ser 0.93; Hemoglobin 13.3; Platelets 377; Potassium 3.6; Sodium 138 11/06/2016: TSH 5.292  Component Value Date/Time   CHOL 276 (H) 11/06/2016 0512   TRIG 250 (H) 11/06/2016 0512   HDL 40 (L) 11/06/2016 0512   CHOLHDL 6.9 11/06/2016 0512   VLDL 50 (H) 11/06/2016 0512   LDLCALC 186 (H) 11/06/2016 0512    Other Studies Reviewed Today:  Eugenie Birks Cardiolite 08/03/2014: FINDINGS: Stress data: The patient was stressed according to the Lexiscan protocol. The heart rate ranged from 53 up to 86 beats per min. The blood pressure averaged 133/69. The patient experienced non limiting chest pain.  Baseline ECG demonstrated sinus rhythm with a diffuse nonspecific T-wave abnormality. There were more pronounced T-wave inversion is with Lexiscan infusion. No arrhythmias were noted this is a nondiagnostic ECG.  Perfusion: No decreased activity in the left ventricle on stress imaging to suggest reversible ischemia or infarction.  Wall Motion: Normal left ventricular wall motion. No left ventricular dilation.  Left Ventricular Ejection Fraction: 54 %  End diastolic volume 43 ml  End systolic volume 20 ml  IMPRESSION: 1. No reversible ischemia or infarction.  2. Normal left ventricular wall motion.  3. Left ventricular ejection fraction 54%  4. Low-risk stress test findings*.  Echocardiogram 11/06/2016: Study Conclusions  - Left ventricle: The cavity size was  normal. There was mild focal   basal hypertrophy of the septum. Systolic function was normal.   The estimated ejection fraction was in the range of 60% to 65%.   Wall motion was normal; there were no regional wall motion   abnormalities. Features are consistent with a pseudonormal left   ventricular filling pattern, with concomitant abnormal relaxation   and increased filling pressure (grade 2 diastolic dysfunction).   Doppler parameters are consistent with indeterminate ventricular   filling pressure. - Aortic valve: Transvalvular velocity was within the normal range.   There was no stenosis. There was no regurgitation. - Mitral valve: Mildly calcified annulus. Transvalvular velocity   was within the normal range. There was no evidence for stenosis.   There was mild regurgitation. - Left atrium: The atrium was moderately dilated. - Right ventricle: The cavity size was normal. Wall thickness was   normal. Systolic function was normal. - Atrial septum: No defect or patent foramen ovale was identified   by color flow Doppler. - Tricuspid valve: There was trivial regurgitation. - Pulmonary arteries: Systolic pressure was within the normal   range. PA peak pressure: 22 mm Hg (S).  Carotid Dopplers 11/06/2016: Summary: Bilateral - 1% to 39% ICA stenosis. Vertebral artery flow is antegrade.  Assessment and Plan:  1. CAD status post CABG in 2006. Ischemic testing from 2016 was low risk. She is walking regularly for exercise and is on a stable medical regimen. Continue observation.  2. Mild carotid artery disease, on statin therapy with aspirin.  3. Mixed hyperlipidemia, reporting compliance with Lipitor at this time. Her last LDL was significantly elevated. She is established Dr. [name] and has follow-up blood work pending.  4. Essential hypertension, keep follow-up with Dr. [name].  Current medicines were reviewed with the patient today.  Disposition: Follow-up in 6  months.  Signed, Jonelle Sidle, MD, [facility] 12/18/2016 4:35 PM    [facility] [address] [place], [place], [place] [address] Phone: [phone]; Fax: [phone]

## 2016-12-18 ENCOUNTER — Ambulatory Visit (INDEPENDENT_AMBULATORY_CARE_PROVIDER_SITE_OTHER): Payer: Medicare Other | Admitting: Cardiology

## 2016-12-18 ENCOUNTER — Encounter: Payer: Self-pay | Admitting: Cardiology

## 2016-12-18 VITALS — BP 148/82 | HR 69 | Ht 62.0 in | Wt 135.8 lb

## 2016-12-18 DIAGNOSIS — I25810 Atherosclerosis of coronary artery bypass graft(s) without angina pectoris: Secondary | ICD-10-CM

## 2016-12-18 DIAGNOSIS — I10 Essential (primary) hypertension: Secondary | ICD-10-CM | POA: Diagnosis not present

## 2016-12-18 DIAGNOSIS — I779 Disorder of arteries and arterioles, unspecified: Secondary | ICD-10-CM

## 2016-12-18 DIAGNOSIS — I739 Peripheral vascular disease, unspecified: Secondary | ICD-10-CM

## 2016-12-18 DIAGNOSIS — E782 Mixed hyperlipidemia: Secondary | ICD-10-CM

## 2016-12-18 NOTE — Patient Instructions (Signed)

## 2017-03-31 ENCOUNTER — Telehealth: Payer: Self-pay | Admitting: Cardiology

## 2017-03-31 ENCOUNTER — Observation Stay (HOSPITAL_COMMUNITY): Payer: Medicare Other

## 2017-03-31 ENCOUNTER — Observation Stay (HOSPITAL_COMMUNITY)
Admission: EM | Admit: 2017-03-31 | Discharge: 2017-04-01 | Disposition: A | Discharge status: Home / Self Care | Payer: Medicare Other | Attending: Family Medicine | Admitting: Family Medicine

## 2017-03-31 ENCOUNTER — Other Ambulatory Visit: Payer: Self-pay

## 2017-03-31 ENCOUNTER — Emergency Department (HOSPITAL_COMMUNITY): Payer: Medicare Other

## 2017-03-31 ENCOUNTER — Encounter (HOSPITAL_COMMUNITY): Payer: Self-pay | Admitting: Emergency Medicine

## 2017-03-31 DIAGNOSIS — E782 Mixed hyperlipidemia: Secondary | ICD-10-CM | POA: Diagnosis not present

## 2017-03-31 DIAGNOSIS — Z951 Presence of aortocoronary bypass graft: Secondary | ICD-10-CM | POA: Insufficient documentation

## 2017-03-31 DIAGNOSIS — E785 Hyperlipidemia, unspecified: Secondary | ICD-10-CM | POA: Diagnosis present

## 2017-03-31 DIAGNOSIS — J449 Chronic obstructive pulmonary disease, unspecified: Secondary | ICD-10-CM | POA: Diagnosis not present

## 2017-03-31 DIAGNOSIS — R079 Chest pain, unspecified: Secondary | ICD-10-CM | POA: Diagnosis present

## 2017-03-31 DIAGNOSIS — I739 Peripheral vascular disease, unspecified: Secondary | ICD-10-CM

## 2017-03-31 DIAGNOSIS — K219 Gastro-esophageal reflux disease without esophagitis: Secondary | ICD-10-CM | POA: Diagnosis not present

## 2017-03-31 DIAGNOSIS — I2510 Atherosclerotic heart disease of native coronary artery without angina pectoris: Secondary | ICD-10-CM | POA: Diagnosis not present

## 2017-03-31 DIAGNOSIS — Z79899 Other long term (current) drug therapy: Secondary | ICD-10-CM | POA: Insufficient documentation

## 2017-03-31 DIAGNOSIS — R0600 Dyspnea, unspecified: Secondary | ICD-10-CM | POA: Insufficient documentation

## 2017-03-31 DIAGNOSIS — J45909 Unspecified asthma, uncomplicated: Secondary | ICD-10-CM | POA: Insufficient documentation

## 2017-03-31 DIAGNOSIS — I10 Essential (primary) hypertension: Secondary | ICD-10-CM | POA: Diagnosis present

## 2017-03-31 DIAGNOSIS — E039 Hypothyroidism, unspecified: Secondary | ICD-10-CM | POA: Insufficient documentation

## 2017-03-31 DIAGNOSIS — I071 Rheumatic tricuspid insufficiency: Secondary | ICD-10-CM | POA: Diagnosis present

## 2017-03-31 DIAGNOSIS — R5381 Other malaise: Secondary | ICD-10-CM | POA: Insufficient documentation

## 2017-03-31 DIAGNOSIS — R531 Weakness: Principal | ICD-10-CM

## 2017-03-31 DIAGNOSIS — I779 Disorder of arteries and arterioles, unspecified: Secondary | ICD-10-CM | POA: Diagnosis present

## 2017-03-31 LAB — CBC WITH DIFFERENTIAL/PLATELET
Basophils Absolute: 0.1 10*3/uL (ref 0.0–0.1)
Basophils Relative: 1 %
Eosinophils Absolute: 0.2 10*3/uL (ref 0.0–0.7)
Eosinophils Relative: 4 %
HCT: 41.3 % (ref 36.0–46.0)
Hemoglobin: 13.1 g/dL (ref 12.0–15.0)
Lymphocytes Relative: 33 %
Lymphs Abs: 1.9 10*3/uL (ref 0.7–4.0)
MCH: 26.9 pg (ref 26.0–34.0)
MCHC: 31.7 g/dL (ref 30.0–36.0)
MCV: 84.8 fL (ref 78.0–100.0)
Monocytes Absolute: 0.7 10*3/uL (ref 0.1–1.0)
Monocytes Relative: 12 %
Neutro Abs: 2.9 10*3/uL (ref 1.7–7.7)
Neutrophils Relative %: 50 %
Platelets: 336 10*3/uL (ref 150–400)
RBC: 4.87 MIL/uL (ref 3.87–5.11)
RDW: 14.1 % (ref 11.5–15.5)
WBC: 5.7 10*3/uL (ref 4.0–10.5)

## 2017-03-31 LAB — BASIC METABOLIC PANEL
Anion gap: 7 (ref 5–15)
BUN: 12 mg/dL (ref 6–20)
CO2: 31 mmol/L (ref 22–32)
Calcium: 9.5 mg/dL (ref 8.9–10.3)
Chloride: 104 mmol/L (ref 101–111)
Creatinine, Ser: 0.8 mg/dL (ref 0.44–1.00)
GFR calc Af Amer: >60 mL/min (ref >60)
GFR calc non Af Amer: >60 mL/min (ref >60)
Glucose, Bld: 124 mg/dL — ABNORMAL HIGH (ref 65–99)
Potassium: 3.8 mmol/L (ref 3.5–5.1)
Sodium: 142 mmol/L (ref 135–145)

## 2017-03-31 LAB — TROPONIN I
Troponin I: <0.03 ng/mL (ref <0.03)
Troponin I: <0.03 ng/mL (ref <0.03)
Troponin I: <0.03 ng/mL (ref <0.03)
Troponin I: <0.03 ng/mL (ref <0.03)

## 2017-03-31 MED ORDER — LISINOPRIL 10 MG PO TABS
20.0000 mg | ORAL_TABLET | Freq: Every day | ORAL | Status: DC
  Administered 2017-03-31: 20 mg via ORAL
  Administered 2017-04-01: 10 mg via ORAL
  Filled 2017-03-31 (×2): qty 2

## 2017-03-31 MED ORDER — ACETAMINOPHEN 325 MG PO TABS
650.0000 mg | ORAL_TABLET | ORAL | Status: DC | PRN
  Administered 2017-03-31 – 2017-04-01 (×2): 650 mg via ORAL
  Filled 2017-03-31 (×2): qty 2

## 2017-03-31 MED ORDER — NITROGLYCERIN 0.4 MG SL SUBL: 0.4000 mg | SUBLINGUAL_TABLET | SUBLINGUAL | Status: DC | PRN

## 2017-03-31 MED ORDER — AMLODIPINE BESYLATE 5 MG PO TABS
10.0000 mg | ORAL_TABLET | Freq: Every day | ORAL | Status: DC
  Administered 2017-03-31 – 2017-04-01 (×2): 10 mg via ORAL
  Filled 2017-03-31 (×2): qty 2

## 2017-03-31 MED ORDER — GUAIFENESIN ER 600 MG PO TB12: 1200.0000 mg | ORAL_TABLET | Freq: Two times a day (BID) | ORAL | Status: DC | PRN

## 2017-03-31 MED ORDER — FUROSEMIDE 40 MG PO TABS
40.0000 mg | ORAL_TABLET | Freq: Once | ORAL | Status: AC
  Administered 2017-03-31: 40 mg via ORAL
  Filled 2017-03-31: qty 1

## 2017-03-31 MED ORDER — ENOXAPARIN SODIUM 40 MG/0.4ML ~~LOC~~ SOLN
40.0000 mg | SUBCUTANEOUS | Status: DC
  Administered 2017-03-31: 40 mg via SUBCUTANEOUS
  Filled 2017-03-31: qty 0.4

## 2017-03-31 MED ORDER — BACLOFEN 10 MG PO TABS: 10.0000 mg | ORAL_TABLET | Freq: Every day | ORAL | Status: DC | PRN

## 2017-03-31 MED ORDER — MOMETASONE FURO-FORMOTEROL FUM 200-5 MCG/ACT IN AERO
2.0000 | INHALATION_SPRAY | Freq: Two times a day (BID) | RESPIRATORY_TRACT | Status: DC
  Filled 2017-03-31: qty 8.8

## 2017-03-31 MED ORDER — ASPIRIN 81 MG PO CHEW
162.0000 mg | CHEWABLE_TABLET | Freq: Once | ORAL | Status: AC
  Administered 2017-03-31: 162 mg via ORAL
  Filled 2017-03-31: qty 2

## 2017-03-31 MED ORDER — GI COCKTAIL ~~LOC~~: 30.0000 mL | Freq: Four times a day (QID) | ORAL | Status: DC | PRN

## 2017-03-31 MED ORDER — ASPIRIN 81 MG PO CHEW
81.0000 mg | CHEWABLE_TABLET | Freq: Every day | ORAL | Status: DC
  Administered 2017-04-01: 81 mg via ORAL
  Filled 2017-03-31 (×3): qty 1

## 2017-03-31 MED ORDER — ZOLPIDEM TARTRATE 5 MG PO TABS: 5.0000 mg | ORAL_TABLET | Freq: Every evening | ORAL | Status: DC | PRN

## 2017-03-31 MED ORDER — ATORVASTATIN CALCIUM 40 MG PO TABS
80.0000 mg | ORAL_TABLET | Freq: Every day | ORAL | Status: DC
  Administered 2017-03-31: 80 mg via ORAL
  Filled 2017-03-31: qty 2

## 2017-03-31 MED ORDER — ONDANSETRON HCL 4 MG/2ML IJ SOLN: 4.0000 mg | Freq: Four times a day (QID) | INTRAMUSCULAR | Status: DC | PRN

## 2017-03-31 MED ORDER — ALPRAZOLAM 0.25 MG PO TABS
0.2500 mg | ORAL_TABLET | Freq: Two times a day (BID) | ORAL | Status: DC | PRN
  Administered 2017-04-01: 0.25 mg via ORAL
  Filled 2017-03-31: qty 1

## 2017-03-31 MED ORDER — AMLODIPINE BESYLATE 5 MG PO TABS
5.0000 mg | ORAL_TABLET | Freq: Once | ORAL | Status: AC
  Administered 2017-03-31: 5 mg via ORAL
  Filled 2017-03-31: qty 1

## 2017-03-31 MED ORDER — ISOSORBIDE MONONITRATE ER 60 MG PO TB24
30.0000 mg | ORAL_TABLET | Freq: Every day | ORAL | Status: DC
  Administered 2017-03-31 – 2017-04-01 (×2): 30 mg via ORAL
  Filled 2017-03-31 (×2): qty 1

## 2017-03-31 MED ORDER — POLYVINYL ALCOHOL 1.4 % OP SOLN: 1.0000 [drp] | Freq: Every day | OPHTHALMIC | Status: DC | PRN

## 2017-03-31 MED ORDER — MOMETASONE FURO-FORMOTEROL FUM 200-5 MCG/ACT IN AERO
2.0000 | INHALATION_SPRAY | Freq: Two times a day (BID) | RESPIRATORY_TRACT | Status: DC
  Administered 2017-03-31 – 2017-04-01 (×2): 2 via RESPIRATORY_TRACT
  Filled 2017-03-31: qty 8.8

## 2017-03-31 MED ORDER — LEVOTHYROXINE SODIUM 112 MCG PO TABS: 112.0000 ug | ORAL_TABLET | Freq: Every day | ORAL | Status: DC

## 2017-03-31 MED ORDER — MONTELUKAST SODIUM 10 MG PO TABS
10.0000 mg | ORAL_TABLET | Freq: Every day | ORAL | Status: DC
  Administered 2017-03-31: 10 mg via ORAL
  Filled 2017-03-31: qty 1

## 2017-03-31 MED ORDER — PANTOPRAZOLE SODIUM 40 MG PO TBEC
40.0000 mg | DELAYED_RELEASE_TABLET | Freq: Every day | ORAL | Status: DC
  Administered 2017-03-31 – 2017-04-01 (×2): 40 mg via ORAL
  Filled 2017-03-31 (×2): qty 1

## 2017-03-31 MED ORDER — FLUTICASONE PROPIONATE 50 MCG/ACT NA SUSP
2.0000 | Freq: Every day | NASAL | Status: DC
  Administered 2017-03-31 – 2017-04-01 (×2): 2 via NASAL
  Filled 2017-03-31: qty 16

## 2017-03-31 MED ORDER — HYDRALAZINE HCL 20 MG/ML IJ SOLN: 10.0000 mg | INTRAMUSCULAR | Status: DC | PRN

## 2017-03-31 NOTE — ED Triage Notes (Signed)
Pt reports feeling weak this am at home, checked bp at home and elevated 214/110. Pt denies pain at present. denies numbness and tingling at present. No neuro deficits noted.

## 2017-03-31 NOTE — Telephone Encounter (Signed)
Pt says most recent BP was 201/177 - pt called EMS herself just prior to my return phone call when she checked her BP again. Remained on phone with pt until EMS arrived. Pt says she was requesting EMS to take her to [facility] ED

## 2017-03-31 NOTE — Progress Notes (Signed)
Paged doctor about abnormal EKG.

## 2017-03-31 NOTE — Telephone Encounter (Signed)
Took her BP this morning 177/101 Stated that she has been experiencing ringing in her ears and quick hard hurting in her head when BP is up

## 2017-03-31 NOTE — Progress Notes (Signed)
Respiratory Care Note: EKG done and given to RN at 1532.

## 2017-03-31 NOTE — ED Provider Notes (Signed)
[facility] EMERGENCY DEPARTMENT Provider Note   CSN: [id number] Arrival date & time: 03/31/17  4098     History   Chief Complaint Chief Complaint  Patient presents with  . Hypertension    HPI Debbie [name] is a 76 y.o. female.  HPI Complains of generalized malaise for the past 2 weeks accompanied by generalized weakness this morning and episode of shortness of breath this morning which lasted for several seconds.  She took her blood pressure at home noted to be 177/102.  She had not yet taken her morning antihypertensive medication.  She denies any chest pain.  She treated herself with 2 baby aspirins this morning.  Brought by EMS.  No further treatment prior to coming here.  Presently asymptomatic except for generalized malaise.  Nothing makes symptoms better or worse.  No other associated symptoms Past Medical History:  Diagnosis Date  . Allergic rhinitis   . Anxiety   . Arthritis   . Asthma   . Bladder spasms   . CAD (coronary artery disease)    CABG 2006 [facility]  . Carotid artery disease (HCC)   . Collagen vascular disease (HCC)   . COPD (chronic obstructive pulmonary disease) (HCC)   . Cystocele   . Essential hypertension   . GERD (gastroesophageal reflux disease)   . Hyperlipidemia   . Hypothyroidism   . Hypothyroidism   . Osteoporosis   . Renal insufficiency   . Vaginal vault prolapse     Patient Active Problem List   Diagnosis Date Noted  . Headache 11/04/2016  . Bereavement due to life event 11/04/2016  . Dyspnea 01/24/2015  . Hypokalemia 09/20/2014  . Chest pain 09/20/2014  . Carotid artery disease (HCC)   . CAD (coronary artery disease)   . Essential hypertension   . Hypothyroidism   . GERD (gastroesophageal reflux disease)   . Allergic rhinitis   . Anxiety   . Hyperlipidemia   . Hx of CABG   . Ejection fraction   . Tricuspid regurgitation     Past Surgical History:  Procedure Laterality Date  . CATARACTS     REMOVED  .  COLONOSCOPY    . CORNEA LACERATION REPAIR    . CORONARY ARTERY BYPASS GRAFT     X 5 VESSELS  . HERNIA REPAIR    . TUBAL LIGATION      OB History    No data available       Home Medications    Prior to Admission medications   Medication Sig Start Date End Date Taking? Authorizing Provider  ALPRAZolam (XANAX) 0.25 MG tablet Take 1 tablet (0.25 mg total) by mouth 2 (two) times daily as needed for anxiety. 11/06/16   Joseph Art, DO  amLODipine (NORVASC) 5 MG tablet Take 1 tablet (5 mg total) by mouth daily. 11/06/16   Joseph Art, DO  aspirin 81 MG tablet Take 81 mg by mouth daily.    [provider]  atorvastatin (LIPITOR) 80 MG tablet Take 80 mg by mouth at bedtime.    [provider]  baclofen (LIORESAL) 10 MG tablet Take 10 mg by mouth daily. 10/31/16   [provider]  Biotin 5000 MCG CAPS Take 5,000 mcg by mouth daily.     [provider]  cetirizine (ZYRTEC) 10 MG tablet Take 10 mg by mouth daily as needed.     [provider]  cholecalciferol (VITAMIN D) 1000 units tablet Take 1,000 Units by mouth daily.  [provider]  fluticasone (FLONASE) 50 MCG/ACT nasal spray Place 2 sprays into the nose daily.    [provider]  Fluticasone-Salmeterol (ADVAIR) 250-50 MCG/DOSE AEPB Inhale 1 puff into the lungs every 12 (twelve) hours.    [provider]  furosemide (LASIX) 40 MG tablet Take 60 mg by mouth daily.     [provider]  guaiFENesin (MUCINEX) 600 MG 12 hr tablet Take 1,200 mg by mouth 2 (two) times daily as needed for congestion.    [provider]  Hypromellose (ARTIFICIAL TEARS OP) Place 1 drop into both eyes daily as needed (dry eyes).    [provider]  ibuprofen (ADVIL,MOTRIN) 200 MG tablet Take 1 tablet (200 mg total) by mouth every 8 (eight) hours as needed. Patient taking differently: Take 200 mg by mouth every 8 (eight) hours as needed for moderate pain.  12/18/15    Vickki Hearing, MD  isosorbide mononitrate (IMDUR) 30 MG 24 hr tablet Take 30 mg by mouth daily. 09/24/16   [provider]  levothyroxine (SYNTHROID, LEVOTHROID) 112 MCG tablet Take 112 mcg by mouth daily. 09/24/16   [provider]  lisinopril (PRINIVIL,ZESTRIL) 20 MG tablet Take 1 tablet (20 mg total) by mouth daily. 11/06/16   Joseph Art, DO  montelukast (SINGULAIR) 10 MG tablet Take 10 mg by mouth at bedtime.    [provider]  NEXIUM 40 MG capsule Take 40 mg by mouth daily. 09/24/16   [provider]  nitroGLYCERIN (NITROSTAT) 0.4 MG SL tablet Place 0.4 mg under the tongue every 5 (five) minutes as needed for chest pain.    [provider]  polyvinyl alcohol (LIQUIFILM TEARS) 1.4 % ophthalmic solution Place 2 drops into both eyes as needed for dry eyes (3-4 times weekly).    [provider]  potassium chloride (MICRO-K) 10 MEQ CR capsule Take 20 mEq by mouth 2 (two) times daily.     [provider]    Family History Family History  Problem Relation Age of Onset  . Cirrhosis Father   . Lung disease Father   . Diabetes Mellitus II Mother   . Heart Problems Mother     Social History Social History   Tobacco Use  . Smoking status: Never Smoker  . Smokeless tobacco: Never Used  Substance Use Topics  . Alcohol use: Yes    Alcohol/week: 0.0 oz    Comment: 1 drink 1 time a year  . Drug use: No     Allergies   Ivp dye [iodinated diagnostic agents]; Betadine [povidone iodine]; Codeine; Other; Penicillins; and Sulfa antibiotics   Review of Systems Review of Systems  Constitutional:       Generalized malaise  HENT: Negative.   Respiratory: Positive for shortness of breath.   Cardiovascular: Negative.   Gastrointestinal: Negative.   Musculoskeletal: Negative.   Skin: Negative.   Neurological: Positive for weakness.       Generalized weakness  Psychiatric/Behavioral: Negative.   All other systems reviewed  and are negative.    Physical Exam Updated Vital Signs BP (!) 170/78 (BP Location: Right Arm)   Pulse 72   Temp 98.5 F (36.9 C) (Oral)   Resp 18   Ht 5' 2.5" (1.588 m)   Wt 59 kg (130 lb)   SpO2 96%   BMI 23.40 kg/m   Physical Exam  Constitutional: She appears well-developed and well-nourished.  HENT:  Head: Normocephalic and atraumatic.  Eyes: Conjunctivae are normal. Pupils are  equal, round, and reactive to light.  Neck: Neck supple. No tracheal deviation present. No thyromegaly present.  Cardiovascular: Normal rate and regular rhythm.  No murmur heard. Pulmonary/Chest: Effort normal and breath sounds normal.  Abdominal: Soft. Bowel sounds are normal. She exhibits no distension. There is no tenderness.  Musculoskeletal: Normal range of motion. She exhibits no edema or tenderness.  Neurological: She is alert. Coordination normal.  Skin: Skin is warm and dry. No rash noted.  Psychiatric: She has a normal mood and affect.  Nursing note and vitals reviewed.    ED Treatments / Results  Labs (all labs ordered are listed, but only abnormal results are displayed) Labs Reviewed  TROPONIN I  BASIC METABOLIC PANEL  CBC WITH DIFFERENTIAL/PLATELET    EKG  EKG Interpretation  Date/Time:  Monday March 31 2017 09:57:22 EST Ventricular Rate:  68 PR Interval:    QRS Duration: 90 QT Interval:  428 QTC Calculation: 456 R Axis:   37 Text Interpretation:  Sinus rhythm Abnormal R-wave progression, early transition Left ventricular hypertrophy Borderline T abnormalities, inferior leads Nonspecific T wave abnormality now evident in Inferior leads New since previous tracing Confirmed by Doug SouJacubowitz, Jodilyn Giese [id number]) on 03/31/2017 10:01:10 AM      Chest x-ray viewed by me Results for orders placed or performed during the hospital encounter of 03/31/17  Troponin I  Result Value Ref Range   Troponin I <0.03 <0.03 ng/mL  Basic metabolic panel  Result Value Ref Range   Sodium 142  135 - 145 mmol/L   Potassium 3.8 3.5 - 5.1 mmol/L   Chloride 104 101 - 111 mmol/L   CO2 31 22 - 32 mmol/L   Glucose, Bld 124 (H) 65 - 99 mg/dL   BUN 12 6 - 20 mg/dL   Creatinine, Ser 6.040.80 0.44 - 1.00 mg/dL   Calcium 9.5 8.9 - 54.010.3 mg/dL   GFR calc non Af Amer >60 >60 mL/min   GFR calc Af Amer >60 >60 mL/min   Anion gap 7 5 - 15  CBC with Differential/Platelet  Result Value Ref Range   WBC 5.7 4.0 - 10.5 K/uL   RBC 4.87 3.87 - 5.11 MIL/uL   Hemoglobin 13.1 12.0 - 15.0 g/dL   HCT 98.141.3 19.136.0 - 47.846.0 %   MCV 84.8 78.0 - 100.0 fL   MCH 26.9 26.0 - 34.0 pg   MCHC 31.7 30.0 - 36.0 g/dL   RDW 29.514.1 62.111.5 - 30.815.5 %   Platelets 336 150 - 400 K/uL   Neutrophils Relative % 50 %   Neutro Abs 2.9 1.7 - 7.7 K/uL   Lymphocytes Relative 33 %   Lymphs Abs 1.9 0.7 - 4.0 K/uL   Monocytes Relative 12 %   Monocytes Absolute 0.7 0.1 - 1.0 K/uL   Eosinophils Relative 4 %   Eosinophils Absolute 0.2 0.0 - 0.7 K/uL   Basophils Relative 1 %   Basophils Absolute 0.1 0.0 - 0.1 K/uL   Dg Chest Port 1 View  Result Date: 03/31/2017 CLINICAL DATA:  Shortness of Breath EXAM: PORTABLE CHEST 1 VIEW COMPARISON:  November 04, 2016 FINDINGS: Lungs appear hyperexpanded. No edema or consolidation. Heart is borderline prominent with pulmonary vascularity within normal limits. Patient is status post median sternotomy with internal mammary bypass grafting. There is aortic atherosclerosis. No evident bone lesions. There is a hiatal hernia. IMPRESSION: Lungs hyperexpanded without edema or consolidation. Stable cardiac silhouette. Small hiatal hernia. There is aortic atherosclerosis. Aortic Atherosclerosis (ICD10-I70.0). Electronically Signed   By: Chrissie NoaWilliam  Margarita GrizzleWoodruff III M.D.   On: 03/31/2017 11:13  Chest x-ray reviewed by me Radiology No results found.  Procedures Procedures (including critical care time)  Medications Ordered in ED Medications  aspirin chewable tablet 162 mg (not administered)  amLODipine (NORVASC) tablet 5  mg (not administered)  furosemide (LASIX) tablet 40 mg (not administered)     Initial Impression / Assessment and Plan / ED Course  I have reviewed the triage vital signs and the nursing notes.  Pertinent labs & imaging results that were available during my care of the patient were reviewed by me and considered in my medical decision making (see chart for details).     In light of dyspnea, generalized malaise weakness EKG changes, concern for anginal equivalent.  Aspirin 162 mg p.o. as well as her usual morning medications consistent Lasix and Norvasc ordered.  I consulted hospitalist who will arrange for overnight admission  Final Clinical Impressions(s) / ED Diagnoses  dx #1 generalized weakness #2 elevated blood pressure #3 dyspnea Final diagnoses:  None    ED Discharge Orders    None       Doug SouJacubowitz, Javed Cotto, MD 03/31/17 1242

## 2017-03-31 NOTE — Plan of Care (Signed)
Pt progressing

## 2017-03-31 NOTE — H&P (Signed)
History and Physical  Debbie [name] WGN:[id number] DOB: April 14, 1941 DOA: 03/31/2017  Referring physician: Felix PaciniJacobuwitz PCP: Kirstie PeriShah, Ashish, MD   Chief Complaint: malaise  HPI: Debbie [name] is a 76 y.o. female with known CAD presenting to ED by EMS with nausea and generalized malaise.  The patient says that she was also having a headache.  She was concerned about her mini stroke.  She also reports that she was concerned about having a heart attack because she normally does not have chest pain when she had her other heart attack.  The patient reports tingling in the fingers that has been intermittent since the symptoms started.  Her blood pressure was noted to be quite elevated this morning which was very concerning for her.  She had not taken her morning blood pressure medications when it was taken.  The patient denies shortness of breath.  She does have a significant cardiac history status post 5 vessel CABG in 2006.  ED course: The patient was noted to be hypertensive with nonspecific EKG changes.  Admission was requested for observation to monitor cardiac enzymes and telemetry.  Review of Systems: All systems reviewed and apart from history of presenting illness, are negative.  Past Medical History:  Diagnosis Date  . Allergic rhinitis   . Anxiety   . Arthritis   . Asthma   . Bladder spasms   . CAD (coronary artery disease)    [facility] [place]  . Carotid artery disease (HCC)   . Collagen vascular disease (HCC)   . COPD (chronic obstructive pulmonary disease) (HCC)   . Cystocele   . Essential hypertension   . GERD (gastroesophageal reflux disease)   . Hyperlipidemia   . Hypothyroidism   . Hypothyroidism   . Osteoporosis   . Renal insufficiency   . Vaginal vault prolapse    Past Surgical History:  Procedure Laterality Date  . CATARACTS     REMOVED  . COLONOSCOPY    . CORNEA LACERATION REPAIR    . CORONARY ARTERY BYPASS GRAFT     X 5 VESSELS  . HERNIA  REPAIR    . TUBAL LIGATION     Social History:  reports that  has never smoked. she has never used smokeless tobacco. She reports that she drinks alcohol. She reports that she does not use drugs.  Allergies  Allergen Reactions  . Ivp Dye [Iodinated Diagnostic Agents] Swelling    Kidney Dye  . Betadine [Povidone Iodine] Rash  . Codeine Nausea And Vomiting  . Other Other (See Comments)    ALL NARCOTICS  . Penicillins Rash  . Sulfa Antibiotics Rash    Family History  Problem Relation Age of Onset  . Cirrhosis Father   . Lung disease Father   . Diabetes Mellitus II Mother   . Heart Problems Mother     Prior to Admission medications   Medication Sig Start Date End Date Taking? Authorizing Provider  ALPRAZolam (XANAX) 0.25 MG tablet Take 1 tablet (0.25 mg total) by mouth 2 (two) times daily as needed for anxiety. 11/06/16  Yes Vann, Jessica U, DO  amLODipine (NORVASC) 5 MG tablet Take 1 tablet (5 mg total) by mouth daily. 11/06/16  Yes Marlin CanaryVann, Jessica U, DO  aspirin 81 MG tablet Take 81 mg by mouth daily.   Yes [provider]  atorvastatin (LIPITOR) 80 MG tablet Take 80 mg by mouth at bedtime.   Yes [provider]  baclofen (LIORESAL) 10 MG tablet Take  10 mg by mouth daily. 10/31/16  Yes [provider]  Biotin 5000 MCG CAPS Take 5,000 mcg by mouth daily.    Yes [provider]  cetirizine (ZYRTEC) 10 MG tablet Take 10 mg by mouth daily as needed.    Yes [provider]  cholecalciferol (VITAMIN D) 1000 units tablet Take 1,000 Units by mouth daily.   Yes [provider]  fluticasone (FLONASE) 50 MCG/ACT nasal spray Place 2 sprays into the nose daily.   Yes [provider]  Fluticasone-Salmeterol (ADVAIR) 250-50 MCG/DOSE AEPB Inhale 1 puff into the lungs every 12 (twelve) hours.   Yes [provider]  furosemide (LASIX) 40 MG tablet Take 60 mg by mouth daily.    Yes [provider]  guaiFENesin (MUCINEX) 600  MG 12 hr tablet Take 1,200 mg by mouth 2 (two) times daily as needed for congestion.   Yes [provider]  Hypromellose (ARTIFICIAL TEARS OP) Place 1 drop into both eyes daily as needed (dry eyes).   Yes [provider]  ibuprofen (ADVIL,MOTRIN) 200 MG tablet Take 1 tablet (200 mg total) by mouth every 8 (eight) hours as needed. Patient taking differently: Take 200 mg by mouth every 8 (eight) hours as needed for moderate pain.  12/18/15  Yes Vickki HearingHarrison, Stanley E, MD  isosorbide mononitrate (IMDUR) 30 MG 24 hr tablet Take 30 mg by mouth daily. 09/24/16  Yes [provider]  levothyroxine (SYNTHROID, LEVOTHROID) 112 MCG tablet Take 112 mcg by mouth daily. 09/24/16  Yes [provider]  lisinopril (PRINIVIL,ZESTRIL) 20 MG tablet Take 1 tablet (20 mg total) by mouth daily. 11/06/16  Yes Vann, Jessica U, DO  montelukast (SINGULAIR) 10 MG tablet Take 10 mg by mouth at bedtime.   Yes [provider]  NEXIUM 40 MG capsule Take 40 mg by mouth daily. 09/24/16  Yes [provider]  nitroGLYCERIN (NITROSTAT) 0.4 MG SL tablet Place 0.4 mg under the tongue every 5 (five) minutes as needed for chest pain.   Yes [provider]  polyvinyl alcohol (LIQUIFILM TEARS) 1.4 % ophthalmic solution Place 2 drops into both eyes as needed for dry eyes (3-4 times weekly).   Yes [provider]  potassium chloride (MICRO-K) 10 MEQ CR capsule Take 20 mEq by mouth 2 (two) times daily.    Yes [provider]   Physical Exam: Vitals:   03/31/17 0954 03/31/17 1002 03/31/17 1112  BP:  (!) 170/78 (!) 175/79  Pulse:  72 60  Resp:  18 18  Temp:  98.5 F (36.9 C)   TempSrc:  Oral   SpO2:  96% 96%  Weight: 59 kg (130 lb)    Height: 5' 2.5" (1.588 m)      General exam: Moderately built and nourished patient, lying comfortably supine on the gurney in no obvious distress.  Head, eyes and ENT: Nontraumatic and normocephalic. Pupils equally reacting to light  and accommodation. Oral mucosa moist.  Neck: Supple. No JVD, carotid bruit or thyromegaly.  Lymphatics: No lymphadenopathy.  Respiratory system: Clear to auscultation. No increased work of breathing.  Cardiovascular system: S1 and S2 heard, RRR. No JVD, murmurs, gallops, clicks or pedal edema.  Gastrointestinal system: Abdomen is nondistended, soft and nontender. Normal bowel sounds heard. No organomegaly or masses appreciated.  Central nervous system: Alert and oriented. No focal neurological deficits.  Extremities: Symmetric 5 x 5 power. Peripheral pulses symmetrically felt.   Skin: No rashes or acute findings.  Musculoskeletal system: Negative exam.  Psychiatry: Pleasant and cooperative.  Labs on Admission:  Basic Metabolic Panel: Recent Labs  Lab 03/31/17 1050  NA 142  K 3.8  CL 104  CO2 31  GLUCOSE 124*  BUN 12  CREATININE 0.80  CALCIUM 9.5   Liver Function Tests: No results for input(s): AST, ALT, ALKPHOS, BILITOT, PROT, ALBUMIN in the last 168 hours. No results for input(s): LIPASE, AMYLASE in the last 168 hours. No results for input(s): AMMONIA in the last 168 hours. CBC: Recent Labs  Lab 03/31/17 1050  WBC 5.7  NEUTROABS 2.9  HGB 13.1  HCT 41.3  MCV 84.8  PLT 336   Cardiac Enzymes: Recent Labs  Lab 03/31/17 1050  TROPONINI <0.03    BNP (last 3 results) No results for input(s): PROBNP in the last 8760 hours. CBG: No results for input(s): GLUCAP in the last 168 hours.  Radiological Exams on Admission: Dg Chest Port 1 View  Result Date: 03/31/2017 CLINICAL DATA:  Shortness of Breath EXAM: PORTABLE CHEST 1 VIEW COMPARISON:  November 04, 2016 FINDINGS: Lungs appear hyperexpanded. No edema or consolidation. Heart is borderline prominent with pulmonary vascularity within normal limits. Patient is status post median sternotomy with internal mammary bypass grafting. There is aortic atherosclerosis. No evident bone lesions. There is a hiatal hernia.  IMPRESSION: Lungs hyperexpanded without edema or consolidation. Stable cardiac silhouette. Small hiatal hernia. There is aortic atherosclerosis. Aortic Atherosclerosis (ICD10-I70.0). Electronically Signed   By: Bretta Bang III M.D.   On: 03/31/2017 11:13   EKG: Independently reviewed.  Nonspecific changes normal sinus rhythm  Assessment/Plan Principal Problem:   Chest pain Active Problems:   CAD (coronary artery disease)   Essential hypertension   Hypothyroidism   GERD (gastroesophageal reflux disease)   Hyperlipidemia   Hx of CABG   Tricuspid regurgitation   Carotid artery disease (HCC)   Generalized weakness  1. Hypertensive urgency-patient has been restarted on her blood pressure medications and BP is starting to improve.  Added hydralazine as needed for elevated blood pressures.  Monitor on telemetry. 2. Coronary artery disease status post CABG-with no symptoms of chest pain.  We will cycle troponin to rule out myocardial ischemia.  Follow telemetry. 3. Hypothyroidism-resume home thyroid supplement medication. 4. GERD-stable resume proton pump inhibitor for GI protection. 5. Hyperlipidemia-resume home medications.  Check lipid panel in morning. 6. Cerebrovascular Disease - check CT head Stat. Neuro checks ordered.  7. Generalized weakness- get PT eval.  CT head to rule out acute abnormalities. Neuro checks ordered.   DVT Prophylaxis: lovenox Code Status: Full   Family Communication: none present   Disposition Plan: Home tomorrow if stable    Time spent: 58 mins  Standley Dakins, MD [facility] Pager [phone]  If 7PM-7AM, please contact night-coverage [email or web address] Password [email or web address] 03/31/2017, 12:19 PM

## 2017-04-01 DIAGNOSIS — I10 Essential (primary) hypertension: Secondary | ICD-10-CM | POA: Diagnosis not present

## 2017-04-01 DIAGNOSIS — I2510 Atherosclerotic heart disease of native coronary artery without angina pectoris: Secondary | ICD-10-CM | POA: Diagnosis not present

## 2017-04-01 DIAGNOSIS — R079 Chest pain, unspecified: Secondary | ICD-10-CM | POA: Diagnosis not present

## 2017-04-01 DIAGNOSIS — R531 Weakness: Secondary | ICD-10-CM | POA: Diagnosis not present

## 2017-04-01 LAB — LIPID PANEL
Cholesterol: 149 mg/dL (ref 0–200)
HDL: 39 mg/dL — ABNORMAL LOW (ref >40)
LDL Cholesterol: 81 mg/dL (ref 0–99)
Total CHOL/HDL Ratio: 3.8 RATIO
Triglycerides: 147 mg/dL (ref <150)
VLDL: 29 mg/dL (ref 0–40)

## 2017-04-01 MED ORDER — AMLODIPINE BESYLATE 10 MG PO TABS: 10.0000 mg | ORAL_TABLET | Freq: Every day | ORAL | 0 refills | Status: DC

## 2017-04-01 MED ORDER — FUROSEMIDE 40 MG PO TABS: 40.0000 mg | ORAL_TABLET | Freq: Every day | ORAL | Status: DC

## 2017-04-01 NOTE — Discharge Instructions (Signed)
Follow with Primary MD  Shah, Ashish, MD  and other consultants as instructed your Hospitalist MD ° °Please get a complete blood count and chemistry panel checked by your Primary MD at your next visit, and again as instructed by your Primary MD. ° °Get Medicines reviewed and adjusted: °Please take all your medications with you for your next visit with your Primary MD ° °Laboratory/radiological data: °Please request your Primary MD to go over all hospital tests and procedure/radiological results at the follow up, please ask your Primary MD to get all Hospital records sent to his/her office. ° °In some cases, they will be blood work, cultures and biopsy results pending at the time of your discharge. Please request that your primary care M.D. follows up on these results. ° °Also Note the following: °If you experience worsening of your admission symptoms, develop shortness of breath, life threatening emergency, suicidal or homicidal thoughts you must seek medical attention immediately by calling 911 or calling your MD immediately  if symptoms less severe. ° °You must read complete instructions/literature along with all the possible adverse reactions/side effects for all the Medicines you take and that have been prescribed to you. Take any new Medicines after you have completely understood and accpet all the possible adverse reactions/side effects.  ° °Do not drive when taking Pain medications or sleeping medications (Benzodaizepines) ° °Do not take more than prescribed Pain, Sleep and Anxiety Medications. It is not advisable to combine anxiety,sleep and pain medications without talking with your primary care practitioner ° °Special Instructions: If you have smoked or chewed Tobacco  in the last 2 yrs please stop smoking, stop any regular Alcohol  and or any Recreational drug use. ° °Wear Seat belts while driving. ° °Please note: °You were cared for by a hospitalist during your hospital stay. Once you are discharged,  your primary care physician will handle any further medical issues. Please note that NO REFILLS for any discharge medications will be authorized once you are discharged, as it is imperative that you return to your primary care physician (or establish a relationship with a primary care physician if you do not have one) for your post hospital discharge needs so that they can reassess your need for medications and monitor your lab values. ° ° ° ° °

## 2017-04-01 NOTE — Progress Notes (Signed)
IV removed, WNL. D/C instructions given to pt. Verbalized understanding. Pt awaiting ride to transport home.  

## 2017-04-01 NOTE — Care Management Obs Status (Signed)
MEDICARE OBSERVATION STATUS NOTIFICATION   Patient Details  Name: Marcelino Dusterlizabeth C Solimine MRN: [id number] Date of Birth: Oct 01, 1940   Medicare Observation Status Notification Given:  Yes    Malcolm MetroChildress, Myson Levi Demske, RN 04/01/2017, 9:27 AM

## 2017-04-01 NOTE — Discharge Summary (Signed)
Physician Discharge Summary  Debbie [name]:[id number] DOB: 1941-03-20 DOA: 03/31/2017  PCP: [name], MD  Admit date: 03/31/2017 Discharge date: 04/01/2017  Admitted From: Home  Disposition: Home with [facility] services  Recommendations for Outpatient Follow-up:  1. Follow up with PCP in 1 weeks for recheck of BP 2. Please obtain BMP/CBC in one week  Home Health: PT, RN  Discharge Condition: STABLE   CODE STATUS: FULL    Brief Hospitalization Summary: Please see all hospital notes, images, labs for full details of the hospitalization.  HPI: Debbie [name] is a 76 y.o. female with known CAD presenting to ED by EMS with nausea and generalized malaise.  The patient says that she was also having a headache.  She was concerned about her mini stroke.  She also reports that she was concerned about having a heart attack because she normally does not have chest pain when she had her other heart attack.  The patient reports tingling in the fingers that has been intermittent since the symptoms started.  Her blood pressure was noted to be quite elevated this morning which was very concerning for her.  She had not taken her morning blood pressure medications when it was taken.  The patient denies shortness of breath.  She does have a significant cardiac history status post 5 vessel CABG in 2006.  ED course: The patient was noted to be hypertensive with nonspecific EKG changes.  Admission was requested for observation to monitor cardiac enzymes and telemetry. Hospital Course 1. Hypertensive urgency-patient has been restarted on her blood pressure medications and BP is starting to improve.  Added hydralazine as needed for elevated blood pressures.  Increased amlodipine to 10 mg po daily.  BP improved prior to discharge was 106/50.  Monitored on telemetry. 2. Coronary artery disease status post CABG-with no symptoms of chest pain.   Troponin negative x 3.   telemetry  stable. 3. Hypothyroidism-resumed home thyroid supplement medication.  Management by PCP.   4. GERD-stable resume proton pump inhibitor for GI protection. 5. Hyperlipidemia-resume home medications.  Lipids checked: LDL 81, TC 149, HDL 39 6. Cerebrovascular Disease - CT head negative for acute findings. Chronic microvascular ischemia unchanged.   7. Generalized weakness- arrange for HHPT, RN  DVT Prophylaxis: lovenox Code Status: Full   Family Communication: none present   Disposition Plan: Home    Discharge Diagnoses:  Principal Problem:   Chest pain Active Problems:   CAD (coronary artery disease)   Essential hypertension   Hypothyroidism   GERD (gastroesophageal reflux disease)   Hyperlipidemia   Hx of CABG   Tricuspid regurgitation   Carotid artery disease (HCC)   Generalized weakness  Discharge Instructions: Discharge Instructions    Call MD for:  difficulty breathing, headache or visual disturbances   Complete by:  As directed    Call MD for:  extreme fatigue   Complete by:  As directed    Call MD for:  persistant dizziness or light-headedness   Complete by:  As directed    Call MD for:  persistant nausea and vomiting   Complete by:  As directed    Call MD for:  severe uncontrolled pain   Complete by:  As directed    Diet - low sodium heart healthy   Complete by:  As directed    Increase activity slowly   Complete by:  As directed      Allergies as of 04/01/2017      Reactions   Ivp  Dye [iodinated Diagnostic Agents] Swelling   Kidney Dye   Betadine [povidone Iodine] Rash   Codeine Nausea And Vomiting   Other Other (See Comments)   ALL NARCOTICS   Penicillins Rash   Sulfa Antibiotics Rash      Medication List    TAKE these medications   ALPRAZolam 0.25 MG tablet Commonly known as:  XANAX Take 1 tablet (0.25 mg total) by mouth 2 (two) times daily as needed for anxiety.   amLODipine 10 MG tablet Commonly known as:  NORVASC Take 1 tablet (10 mg  total) daily by mouth. What changed:    medication strength  how much to take   ARTIFICIAL TEARS OP Place 1 drop into both eyes daily as needed (dry eyes).   aspirin 81 MG tablet Take 81 mg by mouth daily.   atorvastatin 80 MG tablet Commonly known as:  LIPITOR Take 80 mg by mouth at bedtime.   baclofen 10 MG tablet Commonly known as:  LIORESAL Take 10 mg by mouth daily.   Biotin 5000 MCG Caps Take 5,000 mcg by mouth daily.   cetirizine 10 MG tablet Commonly known as:  ZYRTEC Take 10 mg by mouth daily as needed.   cholecalciferol 1000 units tablet Commonly known as:  VITAMIN D Take 1,000 Units by mouth daily.   fluticasone 50 MCG/ACT nasal spray Commonly known as:  FLONASE Place 2 sprays into the nose daily.   Fluticasone-Salmeterol 250-50 MCG/DOSE Aepb Commonly known as:  ADVAIR Inhale 1 puff into the lungs every 12 (twelve) hours.   furosemide 40 MG tablet Commonly known as:  LASIX Take 1 tablet (40 mg total) daily by mouth. Start taking on:  04/02/2017 What changed:  how much to take   guaiFENesin 600 MG 12 hr tablet Commonly known as:  MUCINEX Take 1,200 mg by mouth 2 (two) times daily as needed for congestion.   ibuprofen 200 MG tablet Commonly known as:  ADVIL,MOTRIN Take 1 tablet (200 mg total) by mouth every 8 (eight) hours as needed. What changed:  reasons to take this   isosorbide mononitrate 30 MG 24 hr tablet Commonly known as:  IMDUR Take 30 mg by mouth daily.   levothyroxine 112 MCG tablet Commonly known as:  SYNTHROID, LEVOTHROID Take 112 mcg by mouth daily.   lisinopril 20 MG tablet Commonly known as:  PRINIVIL,ZESTRIL Take 1 tablet (20 mg total) by mouth daily.   montelukast 10 MG tablet Commonly known as:  SINGULAIR Take 10 mg by mouth at bedtime.   NEXIUM 40 MG capsule Generic drug:  esomeprazole Take 40 mg by mouth daily.   nitroGLYCERIN 0.4 MG SL tablet Commonly known as:  NITROSTAT Place 0.4 mg under the tongue every 5  (five) minutes as needed for chest pain.   polyvinyl alcohol 1.4 % ophthalmic solution Commonly known as:  LIQUIFILM TEARS Place 2 drops into both eyes as needed for dry eyes (3-4 times weekly).   potassium chloride 10 MEQ CR capsule Commonly known as:  MICRO-K Take 20 mEq by mouth 2 (two) times daily.      Follow-up Information    [name], MD. Schedule an appointment as soon as possible for a visit in 1 week(s).   Specialty:  Internal Medicine Why:  Hospital Follow Up  Contact information: [address] [place] [address] [phone]          Allergies  Allergen Reactions  . Ivp Dye [Iodinated Diagnostic Agents] Swelling    Kidney Dye  .  Betadine [Povidone Iodine] Rash  . Codeine Nausea And Vomiting  . Other Other (See Comments)    ALL NARCOTICS  . Penicillins Rash  . Sulfa Antibiotics Rash   Current Discharge Medication List    CONTINUE these medications which have CHANGED   Details  amLODipine (NORVASC) 10 MG tablet Take 1 tablet (10 mg total) daily by mouth. Qty: 30 tablet, Refills: 0    furosemide (LASIX) 40 MG tablet Take 1 tablet (40 mg total) daily by mouth. Qty: 30 tablet      CONTINUE these medications which have NOT CHANGED   Details  ALPRAZolam (XANAX) 0.25 MG tablet Take 1 tablet (0.25 mg total) by mouth 2 (two) times daily as needed for anxiety. Qty: 30 tablet, Refills: 0    aspirin 81 MG tablet Take 81 mg by mouth daily.    atorvastatin (LIPITOR) 80 MG tablet Take 80 mg by mouth at bedtime.    baclofen (LIORESAL) 10 MG tablet Take 10 mg by mouth daily.    Biotin 5000 MCG CAPS Take 5,000 mcg by mouth daily.     cetirizine (ZYRTEC) 10 MG tablet Take 10 mg by mouth daily as needed.     cholecalciferol (VITAMIN D) 1000 units tablet Take 1,000 Units by mouth daily.    fluticasone (FLONASE) 50 MCG/ACT nasal spray Place 2 sprays into the nose daily.    Fluticasone-Salmeterol (ADVAIR) 250-50 MCG/DOSE AEPB Inhale 1 puff into the lungs every  12 (twelve) hours.    guaiFENesin (MUCINEX) 600 MG 12 hr tablet Take 1,200 mg by mouth 2 (two) times daily as needed for congestion.    !! Hypromellose (ARTIFICIAL TEARS OP) Place 1 drop into both eyes daily as needed (dry eyes).    ibuprofen (ADVIL,MOTRIN) 200 MG tablet Take 1 tablet (200 mg total) by mouth every 8 (eight) hours as needed. Qty: 90 tablet, Refills: 5   Associated Diagnoses: Spinal stenosis of lumbar region    isosorbide mononitrate (IMDUR) 30 MG 24 hr tablet Take 30 mg by mouth daily.    levothyroxine (SYNTHROID, LEVOTHROID) 112 MCG tablet Take 112 mcg by mouth daily.    lisinopril (PRINIVIL,ZESTRIL) 20 MG tablet Take 1 tablet (20 mg total) by mouth daily. Qty: 30 tablet, Refills: 0    montelukast (SINGULAIR) 10 MG tablet Take 10 mg by mouth at bedtime.    NEXIUM 40 MG capsule Take 40 mg by mouth daily.    nitroGLYCERIN (NITROSTAT) 0.4 MG SL tablet Place 0.4 mg under the tongue every 5 (five) minutes as needed for chest pain.    !! polyvinyl alcohol (LIQUIFILM TEARS) 1.4 % ophthalmic solution Place 2 drops into both eyes as needed for dry eyes (3-4 times weekly).    potassium chloride (MICRO-K) 10 MEQ CR capsule Take 20 mEq by mouth 2 (two) times daily.      !! - Potential duplicate medications found. Please discuss with provider.      Procedures/Studies: Ct Head Wo Contrast  Result Date: 03/31/2017 CLINICAL DATA:  Hypertension with altered mental status EXAM: CT HEAD WITHOUT CONTRAST TECHNIQUE: Contiguous axial images were obtained from the base of the skull through the vertex without intravenous contrast. COMPARISON:  Head CT November 04, 2016 and brain MRI November 05, 2016 FINDINGS: Brain: There is mild diffuse atrophy. There is no intracranial mass, hemorrhage, extra-axial fluid collection, or midline shift. There are prior small lacunar infarcts in the left lentiform nucleus and anterior left thalamus. There is slight small vessel disease in the centra semiovale  bilaterally. There are no new gray-white compartment lesions evident. No acute infarct appreciable. Vascular: No hyperdense vessel. There are foci of calcification in each carotid siphon region. Skull: The bony calvarium appears intact. Sinuses/Orbits: Patient has had antrostomies bilaterally. Visualized paranasal sinuses are clear. There is rightward deviation of the nasal septum. Visualized orbits appear symmetric bilaterally. Other: Mastoid air cells are clear. IMPRESSION: Mild atrophy with mild periventricular small vessel disease. Prior small lacunar infarcts in the left lentiform nucleus and anterior left thalamus are stable. No acute infarct evident. No evident intracranial mass, hemorrhage, or extra-axial fluid collection. Foci of arterial vascular calcification noted. Deviated nasal septum. Status post antrostomies bilaterally. Electronically Signed   By: Bretta BangWilliam  Woodruff III M.D.   On: 03/31/2017 14:30   Dg Chest Port 1 View  Result Date: 03/31/2017 CLINICAL DATA:  Shortness of Breath EXAM: PORTABLE CHEST 1 VIEW COMPARISON:  November 04, 2016 FINDINGS: Lungs appear hyperexpanded. No edema or consolidation. Heart is borderline prominent with pulmonary vascularity within normal limits. Patient is status post median sternotomy with internal mammary bypass grafting. There is aortic atherosclerosis. No evident bone lesions. There is a hiatal hernia. IMPRESSION: Lungs hyperexpanded without edema or consolidation. Stable cardiac silhouette. Small hiatal hernia. There is aortic atherosclerosis. Aortic Atherosclerosis (ICD10-I70.0). Electronically Signed   By: Bretta BangWilliam  Woodruff III M.D.   On: 03/31/2017 11:13      Subjective: Pt says she is feeling a lot better now that her BP has come down. She is ambulating to bathroom.   Discharge Exam: Vitals:   04/01/17 0300 04/01/17 0748  BP: (!) 106/47   Pulse: 64   Resp: 20   Temp: 97.9 F (36.6 C)   SpO2: 95% 92%   Vitals:   03/31/17 1900 03/31/17 2010  04/01/17 0300 04/01/17 0748  BP: 120/65  (!) 106/47   Pulse: 77  64   Resp: 20  20   Temp: 98.4 F (36.9 C)  97.9 F (36.6 C)   TempSrc: Oral  Oral   SpO2: 96% 96% 95% 92%  Weight:      Height:       General: Pt is alert, awake, not in acute distress Cardiovascular: RRR, S1/S2 +, no rubs, no gallops Respiratory: CTA bilaterally, no wheezing, no rhonchi Abdominal: Soft, NT, ND, bowel sounds + Extremities: no edema, no cyanosis   The results of significant diagnostics from this hospitalization (including imaging, microbiology, ancillary and laboratory) are listed below for reference.     Microbiology: No results found for this or any previous visit (from the past 240 hour(s)).   Labs: BNP (last 3 results) No results for input(s): BNP in the last 8760 hours. Basic Metabolic Panel: Recent Labs  Lab 03/31/17 1050  NA 142  K 3.8  CL 104  CO2 31  GLUCOSE 124*  BUN 12  CREATININE 0.80  CALCIUM 9.5   Liver Function Tests: No results for input(s): AST, ALT, ALKPHOS, BILITOT, PROT, ALBUMIN in the last 168 hours. No results for input(s): LIPASE, AMYLASE in the last 168 hours. No results for input(s): AMMONIA in the last 168 hours. CBC: Recent Labs  Lab 03/31/17 1050  WBC 5.7  NEUTROABS 2.9  HGB 13.1  HCT 41.3  MCV 84.8  PLT 336   Cardiac Enzymes: Recent Labs  Lab 03/31/17 1050 03/31/17 1535 03/31/17 1833 03/31/17 2202  TROPONINI <0.03 <0.03 <0.03 <0.03   BNP: Invalid input(s): POCBNP CBG: No results for input(s): GLUCAP in the last 168 hours. D-Dimer No results for input(s):  DDIMER in the last 72 hours. Hgb A1c No results for input(s): HGBA1C in the last 72 hours. Lipid Profile Recent Labs    04/01/17 0544  CHOL 149  HDL 39*  LDLCALC 81  TRIG 161147  CHOLHDL 3.8   Thyroid function studies No results for input(s): TSH, T4TOTAL, T3FREE, THYROIDAB in the last 72 hours.  Invalid input(s): FREET3 Anemia work up No results for input(s): VITAMINB12,  FOLATE, FERRITIN, TIBC, IRON, RETICCTPCT in the last 72 hours. Urinalysis    Component Value Date/Time   COLORURINE STRAW (A) 09/20/2014 1653   APPEARANCEUR CLEAR 09/20/2014 1653   LABSPEC 1.010 09/20/2014 1653   PHURINE 5.5 09/20/2014 1653   GLUCOSEU NEGATIVE 09/20/2014 1653   HGBUR NEGATIVE 09/20/2014 1653   BILIRUBINUR NEGATIVE 09/20/2014 1653   KETONESUR NEGATIVE 09/20/2014 1653   PROTEINUR NEGATIVE 09/20/2014 1653   UROBILINOGEN 0.2 09/20/2014 1653   NITRITE NEGATIVE 09/20/2014 1653   LEUKOCYTESUR SMALL (A) 09/20/2014 1653   Sepsis Labs Invalid input(s): PROCALCITONIN,  WBC,  LACTICIDVEN Microbiology No results found for this or any previous visit (from the past 240 hour(s)).  Time coordinating discharge:   SIGNED:  Standley Dakinslanford Madisynn Plair, MD  [facility] 04/01/2017, 8:59 AM Pager [phone]  If 7PM-7AM, please contact night-coverage [email or web address] Password [email or web address]

## 2017-06-20 NOTE — Progress Notes (Deleted)
Cardiology Office Note  Date: 06/20/2017   ID: Debbie [name], DOB 21-Jul-1940, MRN [id number]  PCP: [name], MD  Primary Cardiologist: [name], MD   No chief complaint on file.   History of Present Illness: Debbie [name] is a 77 y.o. female last seen in August 2018.  Was hospitalized in November 2018 with hypertensive urgency, medications were adjusted at that time.  Past Medical History:  Diagnosis Date  . Allergic rhinitis   . Anxiety   . Arthritis   . Asthma   . Bladder spasms   . CAD (coronary artery disease)    CABG 2006 [facility]  . Carotid artery disease (HCC)   . Collagen vascular disease (HCC)   . COPD (chronic obstructive pulmonary disease) (HCC)   . Cystocele   . Essential hypertension   . GERD (gastroesophageal reflux disease)   . Hyperlipidemia   . Hypothyroidism   . Hypothyroidism   . Osteoporosis   . Renal insufficiency   . Vaginal vault prolapse     Past Surgical History:  Procedure Laterality Date  . ANTERIOR AND POSTERIOR REPAIR N/A 04/06/2013   Procedure: CYSTOCELE REPAIR ;  Surgeon: Martina Sinner, MD;  Location: [facility] ORS;  Service: Urology;  Laterality: N/A;  . CATARACTS     REMOVED  . COLONOSCOPY    . CORNEA LACERATION REPAIR    . CORONARY ARTERY BYPASS GRAFT     X 5 VESSELS  . CYSTOSCOPY N/A 04/06/2013   Procedure: CYSTOSCOPY;  Surgeon: Martina Sinner, MD;  Location: [facility] ORS;  Service: Urology;  Laterality: N/A;  . HERNIA REPAIR    . SEPTOPLASTY N/A 04/28/2015   Procedure: SEPTOPLASTY;  Surgeon: Melvenia Beam, MD;  Location: [facility] OR;  Service: ENT;  Laterality: N/A;  . SINUS ENDO W/FUSION Bilateral 04/28/2015   Procedure: ENDOSCOPIC SINUS SURGERY WITH NAVIGATION;  Surgeon: Melvenia Beam, MD;  Location[facility] OR;  Service: ENT;  Laterality: Bilateral;  . TUBAL LIGATION      Current Outpatient Medications  Medication Sig Dispense Refill  . ALPRAZolam (XANAX) 0.25 MG tablet Take 1 tablet (0.25 mg total)  by mouth 2 (two) times daily as needed for anxiety. 30 tablet 0  . amLODipine (NORVASC) 10 MG tablet Take 1 tablet (10 mg total) daily by mouth. 30 tablet 0  . aspirin 81 MG tablet Take 81 mg by mouth daily.    [name] atorvastatin (LIPITOR) 80 MG tablet Take 80 mg by mouth at bedtime.    . baclofen (LIORESAL) 10 MG tablet Take 10 mg by mouth daily.    . Biotin 5000 MCG CAPS Take 5,000 mcg by mouth daily.     . cetirizine (ZYRTEC) 10 MG tablet Take 10 mg by mouth daily as needed.     . cholecalciferol (VITAMIN D) 1000 units tablet Take 1,000 Units by mouth daily.    . fluticasone (FLONASE) 50 MCG/ACT nasal spray Place 2 sprays into the nose daily.    . Fluticasone-Salmeterol (ADVAIR) 250-50 MCG/DOSE AEPB Inhale 1 puff into the lungs every 12 (twelve) hours.    . furosemide (LASIX) 40 MG tablet Take 1 tablet (40 mg total) daily by mouth. 30 tablet   . guaiFENesin (MUCINEX) 600 MG 12 hr tablet Take 1,200 mg by mouth 2 (two) times daily as needed for congestion.    . Hypromellose (ARTIFICIAL TEARS OP) Place 1 drop into both eyes daily as needed (dry eyes).    [name] ibuprofen (ADVIL,MOTRIN) 200 MG tablet Take 1  tablet (200 mg total) by mouth every 8 (eight) hours as needed. (Patient taking differently: Take 200 mg by mouth every 8 (eight) hours as needed for moderate pain. ) 90 tablet 5  . isosorbide mononitrate (IMDUR) 30 MG 24 hr tablet Take 30 mg by mouth daily.    [name] levothyroxine (SYNTHROID, LEVOTHROID) 112 MCG tablet Take 112 mcg by mouth daily.    [name] lisinopril (PRINIVIL,ZESTRIL) 20 MG tablet Take 1 tablet (20 mg total) by mouth daily. 30 tablet 0  . montelukast (SINGULAIR) 10 MG tablet Take 10 mg by mouth at bedtime.    [name] NEXIUM 40 MG capsule Take 40 mg by mouth daily.    . nitroGLYCERIN (NITROSTAT) 0.4 MG SL tablet Place 0.4 mg under the tongue every 5 (five) minutes as needed for chest pain.    . polyvinyl alcohol (LIQUIFILM TEARS) 1.4 % ophthalmic solution Place 2 drops into both eyes as needed for  dry eyes (3-4 times weekly).    . potassium chloride (MICRO-K) 10 MEQ CR capsule Take 20 mEq by mouth 2 (two) times daily.      No current facility-administered medications for this visit.    Allergies:  Ivp dye [iodinated diagnostic agents]; Betadine [povidone iodine]; Codeine; Other; Penicillins; and Sulfa antibiotics   Social History: The patient  reports that  has never smoked. she has never used smokeless tobacco. She reports that she drinks alcohol. She reports that she does not use drugs.   Family History: The patient's family history includes Cirrhosis in her father; Diabetes Mellitus II in her mother; Heart Problems in her mother; Lung disease in her father.   ROS:  Please see the history of present illness. Otherwise, complete review of systems is positive for {NONE DEFAULTED:18576::"none"}.  All other systems are reviewed and negative.   Physical Exam: VS:  There were no vitals taken for this visit., BMI There is no height or weight on file to calculate BMI.  Wt Readings from Last 3 Encounters:  03/31/17 130 lb 1.1 oz (59 kg)  12/18/16 135 lb 12.8 oz (61.6 kg)  11/06/16 126 lb 1.6 oz (57.2 kg)    General: Patient appears comfortable at rest. HEENT: Conjunctiva and lids normal, oropharynx clear with moist mucosa. Neck: Supple, no elevated JVP or carotid bruits, no thyromegaly. Lungs: Clear to auscultation, nonlabored breathing at rest. Cardiac: Regular rate and rhythm, no S3 or significant systolic murmur, no pericardial rub. Abdomen: Soft, nontender, no hepatomegaly, bowel sounds present, no guarding or rebound. Extremities: No pitting edema, distal pulses 2+. Skin: Warm and dry. Musculoskeletal: No kyphosis. Neuropsychiatric: Alert and oriented x3, affect grossly appropriate.  ECG: I personally reviewed the tracing from 03/31/2017 which showed sinus rhythm with increased voltage and nonspecific T wave changes.  Recent Labwork: 11/06/2016: TSH 5.292 03/31/2017: BUN 12;  Creatinine, Ser 0.80; Hemoglobin 13.1; Platelets 336; Potassium 3.8; Sodium 142     Component Value Date/Time   CHOL 149 04/01/2017 0544   TRIG 147 04/01/2017 0544   HDL 39 (L) 04/01/2017 0544   CHOLHDL 3.8 04/01/2017 0544   VLDL 29 04/01/2017 0544   LDLCALC 81 04/01/2017 0544    Other Studies Reviewed Today:  Eugenie Birks Cardiolite 08/03/2014: FINDINGS: Stress data: The patient was stressed according to the Lexiscan protocol. The heart rate ranged from 53 up to 86 beats per min. The blood pressure averaged 133/69. The patient experienced non limiting chest pain.  Baseline ECG demonstrated sinus rhythm with a diffuse nonspecific T-wave abnormality. There were more pronounced T-wave  inversion is with Lexiscan infusion. No arrhythmias were noted this is a nondiagnostic ECG.  Perfusion: No decreased activity in the left ventricle on stress imaging to suggest reversible ischemia or infarction.  Wall Motion: Normal left ventricular wall motion. No left ventricular dilation.  Left Ventricular Ejection Fraction: 54 %  End diastolic volume 43 ml  End systolic volume 20 ml  IMPRESSION: 1. No reversible ischemia or infarction.  2. Normal left ventricular wall motion.  3. Left ventricular ejection fraction 54%  4. Low-risk stress test findings*.  Echocardiogram 11/06/2016: Study Conclusions  - Left ventricle: The cavity size was normal. There was mild focal basal hypertrophy of the septum. Systolic function was normal. The estimated ejection fraction was in the range of 60% to 65%. Wall motion was normal; there were no regional wall motion abnormalities. Features are consistent with a pseudonormal left ventricular filling pattern, with concomitant abnormal relaxation and increased filling pressure (grade 2 diastolic dysfunction). Doppler parameters are consistent with indeterminate ventricular filling pressure. - Aortic valve: Transvalvular  velocity was within the normal range. There was no stenosis. There was no regurgitation. - Mitral valve: Mildly calcified annulus. Transvalvular velocity was within the normal range. There was no evidence for stenosis. There was mild regurgitation. - Left atrium: The atrium was moderately dilated. - Right ventricle: The cavity size was normal. Wall thickness was normal. Systolic function was normal. - Atrial septum: No defect or patent foramen ovale was identified by color flow Doppler. - Tricuspid valve: There was trivial regurgitation. - Pulmonary arteries: Systolic pressure was within the normal range. PA peak pressure: 22 mm Hg (S).  Assessment and Plan:    Current medicines were reviewed with the patient today.  No orders of the defined types were placed in this encounter.   Disposition:  Signed, Jonelle SidleSamuel G. Tykeisha Peer, MD, [facility] InstituteFACC 06/20/2017 3:29 PM    [facility] [address], [place], [place] [address] Phone: [phone]; Fax: [phone]

## 2017-06-24 ENCOUNTER — Ambulatory Visit: Payer: Medicare Other | Admitting: Cardiology

## 2017-07-21 NOTE — Progress Notes (Signed)
Cardiology Office Note  Date: 07/22/2017   ID: Debbie [name], DOB [birth year], MRN [id number]  PCP: Kirstie PeriShah, Ashish, MD  Primary Cardiologist: Nona DellSamuel McDowell, MD   Chief Complaint  Patient presents with  . Coronary Artery Disease    History of Present Illness: Debbie [name] is a 77 y.o. female last seen in August 2018.  She presents for a routine follow-up visit.  She does not report any angina symptoms or increasing shortness of breath with typical activity.  Blood pressure is up this morning, she had not taken her morning medicines as yet.  Current cardiac regimen includes aspirin, Norvasc, Lipitor, Lasix, Imdur, lisinopril, potassium supplements, and as needed nitroglycerin.  Follow-up echocardiogram in June 2018 revealed LVEF 60-65% with moderate diastolic dysfunction.  Myoview from 2016 was low risk.  Past Medical History:  Diagnosis Date  . Allergic rhinitis   . Anxiety   . Arthritis   . Asthma   . Bladder spasms   . CAD (coronary artery disease)    [facility] [place]  . Carotid artery disease (HCC)   . Collagen vascular disease (HCC)   . COPD (chronic obstructive pulmonary disease) (HCC)   . Cystocele   . Essential hypertension   . GERD (gastroesophageal reflux disease)   . Hyperlipidemia   . Hypothyroidism   . Hypothyroidism   . Osteoporosis   . Renal insufficiency   . Vaginal vault prolapse     Past Surgical History:  Procedure Laterality Date  . ANTERIOR AND POSTERIOR REPAIR N/A 04/06/2013   Procedure: CYSTOCELE REPAIR ;  Surgeon: Martina SinnerScott A MacDiarmid, MD;  Location: [facility];  Service: Urology;  Laterality: N/A;  . CATARACTS     REMOVED  . COLONOSCOPY    . CORNEA LACERATION REPAIR    . CORONARY ARTERY BYPASS GRAFT     X 5 VESSELS  . CYSTOSCOPY N/A 04/06/2013   Procedure: CYSTOSCOPY;  Surgeon: Martina SinnerScott A MacDiarmid, MD;  Location: [facility];  Service: Urology;  Laterality: N/A;  . HERNIA REPAIR    . SEPTOPLASTY N/A 04/28/2015   Procedure:  SEPTOPLASTY;  Surgeon: Melvenia BeamMitchell Gore, MD;  [facility];  Service: ENT;  Laterality: N/A;  . SINUS ENDO W/FUSION Bilateral 04/28/2015   Procedure: ENDOSCOPIC SINUS SURGERY WITH NAVIGATION;  Surgeon: Melvenia BeamMitchell Gore, MD;  [facility];  Service: ENT;  Laterality: Bilateral;  . TUBAL LIGATION      Current Outpatient Medications  Medication Sig Dispense Refill  . ALPRAZolam (XANAX) 0.25 MG tablet Take 1 tablet (0.25 mg total) by mouth 2 (two) times daily as needed for anxiety. 30 tablet 0  . amLODipine (NORVASC) 10 MG tablet Take 1 tablet (10 mg total) daily by mouth. 30 tablet 0  . aspirin 81 MG tablet Take 81 mg by mouth daily.    [name]. atorvastatin (LIPITOR) 80 MG tablet Take 80 mg by mouth at bedtime.    . baclofen (LIORESAL) 10 MG tablet Take 10 mg by mouth daily.    . Biotin 5000 MCG CAPS Take 5,000 mcg by mouth daily.     . cetirizine (ZYRTEC) 10 MG tablet Take 10 mg by mouth daily as needed.     . cholecalciferol (VITAMIN D) 1000 units tablet Take 1,000 Units by mouth daily.    . fluticasone (FLONASE) 50 MCG/ACT nasal spray Place 2 sprays into the nose daily.    . Fluticasone-Salmeterol (ADVAIR) 250-50 MCG/DOSE AEPB Inhale 1 puff into the lungs every 12 (twelve) hours.    .[name]  furosemide (LASIX) 40 MG tablet Take 1 tablet (40 mg total) daily by mouth. 30 tablet   . guaiFENesin (MUCINEX) 600 MG 12 hr tablet Take 1,200 mg by mouth 2 (two) times daily as needed for congestion.    . Hypromellose (ARTIFICIAL TEARS OP) Place 1 drop into both eyes daily as needed (dry eyes).    [name] ibuprofen (ADVIL,MOTRIN) 200 MG tablet Take 1 tablet (200 mg total) by mouth every 8 (eight) hours as needed. (Patient taking differently: Take 200 mg by mouth every 8 (eight) hours as needed for moderate pain. ) 90 tablet 5  . isosorbide mononitrate (IMDUR) 30 MG 24 hr tablet Take 30 mg by mouth daily.    [name] levothyroxine (SYNTHROID, LEVOTHROID) 112 MCG tablet Take 112 mcg by mouth daily.    [name] lisinopril (PRINIVIL,ZESTRIL)  20 MG tablet Take 1 tablet (20 mg total) by mouth daily. 30 tablet 0  . montelukast (SINGULAIR) 10 MG tablet Take 10 mg by mouth at bedtime.    [name] NEXIUM 40 MG capsule Take 40 mg by mouth daily.    . nitroGLYCERIN (NITROSTAT) 0.4 MG SL tablet Place 0.4 mg under the tongue every 5 (five) minutes as needed for chest pain.    . polyvinyl alcohol (LIQUIFILM TEARS) 1.4 % ophthalmic solution Place 2 drops into both eyes as needed for dry eyes (3-4 times weekly).    . potassium chloride (MICRO-K) 10 MEQ CR capsule Take 20 mEq by mouth 2 (two) times daily.      No current facility-administered medications for this visit.    Allergies:  Ivp dye [iodinated diagnostic agents]; Betadine [povidone iodine]; Codeine; Other; Penicillins; and Sulfa antibiotics   Social History: The patient  reports that  has never smoked. she has never used smokeless tobacco. She reports that she drinks alcohol. She reports that she does not use drugs.   ROS:  Please see the history of present illness. Otherwise, complete review of systems is positive for occasional headaches.  All other systems are reviewed and negative.   Physical Exam: VS:  BP (!) 150/90 Comment: has not taken morning meds yet  Pulse 65   Ht 5\' 2"  (1.575 m)   Wt 134 lb 3.2 oz (60.9 kg)   SpO2 95%   BMI 24.55 kg/m , BMI Body mass index is 24.55 kg/m.  Wt Readings from Last 3 Encounters:  07/22/17 134 lb 3.2 oz (60.9 kg)  03/31/17 130 lb 1.1 oz (59 kg)  12/18/16 135 lb 12.8 oz (61.6 kg)    General: Patient appears comfortable at rest. HEENT: Conjunctiva and lids normal, oropharynx clear. Neck: Supple, no elevated JVP or carotid bruits, no thyromegaly. Lungs: No wheezing, nonlabored breathing at rest. Cardiac: Regular rate and rhythm, no S3, soft systolic murmur. Abdomen: Soft, nontender, bowel sounds present. Extremities: No pitting edema, distal pulses 2+. Skin: Warm and dry. Musculoskeletal: No kyphosis. Neuropsychiatric: Alert and oriented  x3, affect grossly appropriate.  ECG: I personally reviewed the tracing from 03/31/2017 which showed sinus rhythm with LVH and nonspecific ST changes.  Recent Labwork: 11/06/2016: TSH 5.292 03/31/2017: BUN 12; Creatinine, Ser 0.80; Hemoglobin 13.1; Platelets 336; Potassium 3.8; Sodium 142     Component Value Date/Time   CHOL 149 04/01/2017 0544   TRIG 147 04/01/2017 0544   HDL 39 (L) 04/01/2017 0544   CHOLHDL 3.8 04/01/2017 0544   VLDL 29 04/01/2017 0544   LDLCALC 81 04/01/2017 0544    Other Studies Reviewed Today:  Echocardiogram 11/05/2016: Study Conclusions  -  Left ventricle: The cavity size was normal. There was mild focal   basal hypertrophy of the septum. Systolic function was normal.   The estimated ejection fraction was in the range of 60% to 65%.   Wall motion was normal; there were no regional wall motion   abnormalities. Features are consistent with a pseudonormal left   ventricular filling pattern, with concomitant abnormal relaxation   and increased filling pressure (grade 2 diastolic dysfunction).   Doppler parameters are consistent with indeterminate ventricular   filling pressure. - Aortic valve: Transvalvular velocity was within the normal range.   There was no stenosis. There was no regurgitation. - Mitral valve: Mildly calcified annulus. Transvalvular velocity   was within the normal range. There was no evidence for stenosis.   There was mild regurgitation. - Left atrium: The atrium was moderately dilated. - Right ventricle: The cavity size was normal. Wall thickness was   normal. Systolic function was normal. - Atrial septum: No defect or patent foramen ovale was identified   by color flow Doppler. - Tricuspid valve: There was trivial regurgitation. - Pulmonary arteries: Systolic pressure was within the normal   range. PA peak pressure: 22 mm Hg (S).  Carotid Dopplers 11/06/2016: Summary: Bilateral - 1% to 39% ICA stenosis. Vertebral artery flow  is antegrade.  Assessment and Plan:  1.  CAD status post CABG in 2006.  She reports no angina on medical therapy and underwent low risk Myoview study in 2016.  Continue with observation.  I reviewed her interval ECG from November 2018.  2.  Mild carotid artery disease, asymptomatic and on aspirin with statin.  3.  Essential hypertension, blood pressure up this morning but she had not yet taken her medications.  No changes made to current regimen.  Keep follow-up with Dr. [name].  4.  Mixed hyperlipidemia, continues on Lipitor.  Last LDL 81.  Current medicines were reviewed with the patient today.  Disposition: Follow-up in 6 months.  Signed, Jonelle Sidle, MD, [facility] 07/22/2017 12:21 PM    [facility] [address] [place], [place], [place] [address] Phone: [phone]; Fax: [phone]

## 2017-07-22 ENCOUNTER — Ambulatory Visit (INDEPENDENT_AMBULATORY_CARE_PROVIDER_SITE_OTHER): Payer: Medicare Other | Admitting: Cardiology

## 2017-07-22 ENCOUNTER — Encounter: Payer: Self-pay | Admitting: Cardiology

## 2017-07-22 VITALS — BP 150/90 | HR 65 | Ht 62.0 in | Wt 134.2 lb

## 2017-07-22 DIAGNOSIS — I6523 Occlusion and stenosis of bilateral carotid arteries: Secondary | ICD-10-CM | POA: Diagnosis not present

## 2017-07-22 DIAGNOSIS — I25119 Atherosclerotic heart disease of native coronary artery with unspecified angina pectoris: Secondary | ICD-10-CM | POA: Diagnosis not present

## 2017-07-22 DIAGNOSIS — E782 Mixed hyperlipidemia: Secondary | ICD-10-CM

## 2017-07-22 DIAGNOSIS — I10 Essential (primary) hypertension: Secondary | ICD-10-CM | POA: Diagnosis not present

## 2017-07-22 NOTE — Patient Instructions (Signed)

## 2017-10-03 ENCOUNTER — Telehealth: Payer: Self-pay | Admitting: Internal Medicine

## 2017-10-03 NOTE — Telephone Encounter (Signed)
Called and spoke with patient, she wanted to know what her pulmonary diagnosis is. I advised patient that when she was last seen in 2016 she was seen for SOB. Patient has an appointment coming up in June and we can further access her then. Nothing further needed.

## 2017-12-01 ENCOUNTER — Ambulatory Visit (INDEPENDENT_AMBULATORY_CARE_PROVIDER_SITE_OTHER): Payer: Medicare Other | Admitting: Otolaryngology

## 2017-12-01 DIAGNOSIS — K122 Cellulitis and abscess of mouth: Secondary | ICD-10-CM | POA: Diagnosis not present

## 2017-12-29 ENCOUNTER — Ambulatory Visit (INDEPENDENT_AMBULATORY_CARE_PROVIDER_SITE_OTHER): Payer: Medicare Other | Admitting: Otolaryngology

## 2018-01-27 NOTE — Progress Notes (Deleted)
Cardiology Office Note  Date: 01/27/2018   ID: Debbie [name], DOB 09-01-1940, MRN [id number]  PCP: [name], MD  Primary Cardiologist: [name], MD   No chief complaint on file.   History of Present Illness: Debbie [name] is a 77 y.o. female last seen in March.   Lexiscan Myoview form 2016 is outlined below.  Past Medical History:  Diagnosis Date  . Allergic rhinitis   . Anxiety   . Arthritis   . Asthma   . Bladder spasms   . CAD (coronary artery disease)    CABG 2006 [facility]  . Carotid artery disease (HCC)   . Collagen vascular disease (HCC)   . COPD (chronic obstructive pulmonary disease) (HCC)   . Cystocele   . Essential hypertension   . GERD (gastroesophageal reflux disease)   . Hyperlipidemia   . Hypothyroidism   . Hypothyroidism   . Osteoporosis   . Renal insufficiency   . Vaginal vault prolapse     Past Surgical History:  Procedure Laterality Date  . ANTERIOR AND POSTERIOR REPAIR N/A 04/06/2013   Procedure: CYSTOCELE REPAIR ;  Surgeon: Martina Sinner, MD;  Location: [facility];  Service: Urology;  Laterality: N/A;  . CATARACTS     REMOVED  . COLONOSCOPY    . CORNEA LACERATION REPAIR    . CORONARY ARTERY BYPASS GRAFT     X 5 VESSELS  . CYSTOSCOPY N/A 04/06/2013   Procedure: CYSTOSCOPY;  Surgeon: Martina Sinner, MD;  Location: [facility];  Service: Urology;  Laterality: N/A;  . HERNIA REPAIR    . SEPTOPLASTY N/A 04/28/2015   Procedure: SEPTOPLASTY;  Surgeon: Melvenia Beam, MD;  Location[facility];  Service: ENT;  Laterality: N/A;  . SINUS ENDO W/FUSION Bilateral 04/28/2015   Procedure: ENDOSCOPIC SINUS SURGERY WITH NAVIGATION;  Surgeon: Melvenia Beam, MD;  Location: [facility] OR;  Service: ENT;  Laterality: Bilateral;  . TUBAL LIGATION      Current Outpatient Medications  Medication Sig Dispense Refill  . ALPRAZolam (XANAX) 0.25 MG tablet Take 1 tablet (0.25 mg total) by mouth 2 (two) times daily as needed for anxiety. 30  tablet 0  . amLODipine (NORVASC) 10 MG tablet Take 1 tablet (10 mg total) daily by mouth. 30 tablet 0  . aspirin 81 MG tablet Take 81 mg by mouth daily.    [name] atorvastatin (LIPITOR) 80 MG tablet Take 80 mg by mouth at bedtime.    . baclofen (LIORESAL) 10 MG tablet Take 10 mg by mouth daily.    . Biotin 5000 MCG CAPS Take 5,000 mcg by mouth daily.     . cetirizine (ZYRTEC) 10 MG tablet Take 10 mg by mouth daily as needed.     . cholecalciferol (VITAMIN D) 1000 units tablet Take 1,000 Units by mouth daily.    . fluticasone (FLONASE) 50 MCG/ACT nasal spray Place 2 sprays into the nose daily.    . Fluticasone-Salmeterol (ADVAIR) 250-50 MCG/DOSE AEPB Inhale 1 puff into the lungs every 12 (twelve) hours.    . furosemide (LASIX) 40 MG tablet Take 1 tablet (40 mg total) daily by mouth. 30 tablet   . guaiFENesin (MUCINEX) 600 MG 12 hr tablet Take 1,200 mg by mouth 2 (two) times daily as needed for congestion.    . Hypromellose (ARTIFICIAL TEARS OP) Place 1 drop into both eyes daily as needed (dry eyes).    [name] ibuprofen (ADVIL,MOTRIN) 200 MG tablet Take 1 tablet (200 mg total) by mouth every  8 (eight) hours as needed. (Patient taking differently: Take 200 mg by mouth every 8 (eight) hours as needed for moderate pain. ) 90 tablet 5  . isosorbide mononitrate (IMDUR) 30 MG 24 hr tablet Take 30 mg by mouth daily.    [name] levothyroxine (SYNTHROID, LEVOTHROID) 112 MCG tablet Take 112 mcg by mouth daily.    [name] lisinopril (PRINIVIL,ZESTRIL) 20 MG tablet Take 1 tablet (20 mg total) by mouth daily. 30 tablet 0  . montelukast (SINGULAIR) 10 MG tablet Take 10 mg by mouth at bedtime.    [name] NEXIUM 40 MG capsule Take 40 mg by mouth daily.    . nitroGLYCERIN (NITROSTAT) 0.4 MG SL tablet Place 0.4 mg under the tongue every 5 (five) minutes as needed for chest pain.    . polyvinyl alcohol (LIQUIFILM TEARS) 1.4 % ophthalmic solution Place 2 drops into both eyes as needed for dry eyes (3-4 times weekly).    . potassium chloride  (MICRO-K) 10 MEQ CR capsule Take 20 mEq by mouth 2 (two) times daily.      No current facility-administered medications for this visit.    Allergies:  Ivp dye [iodinated diagnostic agents]; Betadine [povidone iodine]; Codeine; Other; Penicillins; and Sulfa antibiotics   Social History: The patient  reports that she has never smoked. She has never used smokeless tobacco. She reports that she drinks alcohol. She reports that she does not use drugs.   Family History: The patient's family history includes Cirrhosis in her father; Diabetes Mellitus II in her mother; Heart Problems in her mother; Lung disease in her father.   ROS:  Please see the history of present illness. Otherwise, complete review of systems is positive for {NONE DEFAULTED:18576::"none"}.  All other systems are reviewed and negative.   Physical Exam: VS:  There were no vitals taken for this visit., BMI There is no height or weight on file to calculate BMI.  Wt Readings from Last 3 Encounters:  07/22/17 134 lb 3.2 oz (60.9 kg)  03/31/17 130 lb 1.1 oz (59 kg)  12/18/16 135 lb 12.8 oz (61.6 kg)    General: Patient appears comfortable at rest. HEENT: Conjunctiva and lids normal, oropharynx clear with moist mucosa. Neck: Supple, no elevated JVP or carotid bruits, no thyromegaly. Lungs: Clear to auscultation, nonlabored breathing at rest. Cardiac: Regular rate and rhythm, no S3 or significant systolic murmur, no pericardial rub. Abdomen: Soft, nontender, no hepatomegaly, bowel sounds present, no guarding or rebound. Extremities: No pitting edema, distal pulses 2+. Skin: Warm and dry. Musculoskeletal: No kyphosis. Neuropsychiatric: Alert and oriented x3, affect grossly appropriate.  ECG: I personally reviewed the tracing from 03/31/2017 which showed sinus rhythm with LVH and nonspecific ST changes.  Recent Labwork: 03/31/2017: BUN 12; Creatinine, Ser 0.80; Hemoglobin 13.1; Platelets 336; Potassium 3.8; Sodium 142       Component Value Date/Time   CHOL 149 04/01/2017 0544   TRIG 147 04/01/2017 0544   HDL 39 (L) 04/01/2017 0544   CHOLHDL 3.8 04/01/2017 0544   VLDL 29 04/01/2017 0544   LDLCALC 81 04/01/2017 0544    Other Studies Reviewed Today:  Echocardiogram 11/05/2016: Study Conclusions  - Left ventricle: The cavity size was normal. There was mild focal basal hypertrophy of the septum. Systolic function was normal. The estimated ejection fraction was in the range of 60% to 65%. Wall motion was normal; there were no regional wall motion abnormalities. Features are consistent with a pseudonormal left ventricular filling pattern, with concomitant abnormal relaxation and increased filling pressure (  grade 2 diastolic dysfunction). Doppler parameters are consistent with indeterminate ventricular filling pressure. - Aortic valve: Transvalvular velocity was within the normal range. There was no stenosis. There was no regurgitation. - Mitral valve: Mildly calcified annulus. Transvalvular velocity was within the normal range. There was no evidence for stenosis. There was mild regurgitation. - Left atrium: The atrium was moderately dilated. - Right ventricle: The cavity size was normal. Wall thickness was normal. Systolic function was normal. - Atrial septum: No defect or patent foramen ovale was identified by color flow Doppler. - Tricuspid valve: There was trivial regurgitation. - Pulmonary arteries: Systolic pressure was within the normal range. PA peak pressure: 22 mm Hg (S).  Carotid Dopplers 11/06/2016: Summary: Bilateral - 1% to 39% ICA stenosis. Vertebral artery flow is antegrade.   Lexiscan Myoview 08/03/2014: IMPRESSION: 1. No reversible ischemia or infarction.  2. Normal left ventricular wall motion.  3. Left ventricular ejection fraction 54%  4. Low-risk stress test findings*.  Assessment and Plan:    Current medicines were reviewed with the  patient today.  No orders of the defined types were placed in this encounter.   Disposition:  Signed, Jonelle Sidle, MD, [facility] 01/27/2018 11:09 AM    [facility] at [facility] [address] [place], [place], [place] [address] Phone: [phone]; Fax: [phone]

## 2018-01-28 ENCOUNTER — Ambulatory Visit: Payer: Medicare Other | Admitting: Cardiology

## 2018-01-29 NOTE — Progress Notes (Signed)
Cardiology Office Note  Date: 01/30/2018   ID: Debbie [name], DOB [birth year], MRN [id number]  PCP: [name], MD  Primary Cardiologist: [name], MD   Chief Complaint  Patient presents with  . Coronary Artery Disease    History of Present Illness: Debbie [name] is a 77 y.o. female last seen in March.  She is here for a routine follow-up visit.  She states that she has done reasonably well, although did wake up with right arm and neck discomfort over the weekend.  She states that this lasted for only a few seconds but that she felt weak and decided to stay in the bed longer.  She took aspirin but not nitroglycerin, does not have a bottle.  She is feeling better today.  I personally reviewed her ECG today which shows a sinus rhythm with short PR and nonspecific T wave changes.  We went over her medications.  Current cardiac regimen includes aspirin, Norvasc, Lipitor, Lasix, Imdur, and lisinopril.  She continues to follow with Dr. [name], had a recent physical with lab work.  Past Medical History:  Diagnosis Date  . Allergic rhinitis   . Anxiety   . Arthritis   . Asthma   . Bladder spasms   . CAD (coronary artery disease)    CABG 2006 [facility]  . Carotid artery disease (HCC)   . Collagen vascular disease (HCC)   . COPD (chronic obstructive pulmonary disease) (HCC)   . Cystocele   . Essential hypertension   . GERD (gastroesophageal reflux disease)   . Hyperlipidemia   . Hypothyroidism   . Hypothyroidism   . Osteoporosis   . Renal insufficiency   . Vaginal vault prolapse     Past Surgical History:  Procedure Laterality Date  . ANTERIOR AND POSTERIOR REPAIR N/A 04/06/2013   Procedure: CYSTOCELE REPAIR ;  Surgeon: Martina Sinner, MD;  Location: [facility] ORS;  Service: Urology;  Laterality: N/A;  . CATARACTS     REMOVED  . COLONOSCOPY    . CORNEA LACERATION REPAIR    . CORONARY ARTERY BYPASS GRAFT     X 5 VESSELS  . CYSTOSCOPY N/A  04/06/2013   Procedure: CYSTOSCOPY;  Surgeon: Martina Sinner, MD;  Location: [facility] ORS;  Service: Urology;  Laterality: N/A;  . HERNIA REPAIR    . SEPTOPLASTY N/A 04/28/2015   Procedure: SEPTOPLASTY;  Surgeon: Melvenia Beam, MD;  Location[facility];  Service: ENT;  Laterality: N/A;  . SINUS ENDO W/FUSION Bilateral 04/28/2015   Procedure: ENDOSCOPIC SINUS SURGERY WITH NAVIGATION;  Surgeon: Melvenia Beam, MD;  Location: [facility] OR;  Service: ENT;  Laterality: Bilateral;  . TUBAL LIGATION      Current Outpatient Medications  Medication Sig Dispense Refill  . ALPRAZolam (XANAX) 0.25 MG tablet Take 1 tablet (0.25 mg total) by mouth 2 (two) times daily as needed for anxiety. 30 tablet 0  . amLODipine (NORVASC) 10 MG tablet Take 1 tablet (10 mg total) daily by mouth. 30 tablet 0  . aspirin 81 MG tablet Take 81 mg by mouth daily.    [name] atorvastatin (LIPITOR) 80 MG tablet Take 80 mg by mouth at bedtime.    . baclofen (LIORESAL) 10 MG tablet Take 10 mg by mouth daily.    . Biotin 5000 MCG CAPS Take 5,000 mcg by mouth daily.     . cetirizine (ZYRTEC) 10 MG tablet Take 10 mg by mouth daily as needed.     . cholecalciferol (VITAMIN D) 1000  units tablet Take 1,000 Units by mouth daily.    . fluticasone (FLONASE) 50 MCG/ACT nasal spray Place 2 sprays into the nose daily.    . Fluticasone-Salmeterol (ADVAIR) 250-50 MCG/DOSE AEPB Inhale 1 puff into the lungs every 12 (twelve) hours.    . furosemide (LASIX) 40 MG tablet Take 1 tablet (40 mg total) daily by mouth. 30 tablet   . guaiFENesin (MUCINEX) 600 MG 12 hr tablet Take 1,200 mg by mouth 2 (two) times daily as needed for congestion.    . Hypromellose (ARTIFICIAL TEARS OP) Place 1 drop into both eyes daily as needed (dry eyes).    [name] ibuprofen (ADVIL,MOTRIN) 200 MG tablet Take 1 tablet (200 mg total) by mouth every 8 (eight) hours as needed. (Patient taking differently: Take 200 mg by mouth every 8 (eight) hours as needed for moderate pain. ) 90 tablet 5  .  isosorbide mononitrate (IMDUR) 30 MG 24 hr tablet Take 30 mg by mouth daily.    [name] levothyroxine (SYNTHROID, LEVOTHROID) 112 MCG tablet Take 112 mcg by mouth daily.    [name] lisinopril (PRINIVIL,ZESTRIL) 20 MG tablet Take 1 tablet (20 mg total) by mouth daily. 30 tablet 0  . montelukast (SINGULAIR) 10 MG tablet Take 10 mg by mouth at bedtime.    [name] NEXIUM 40 MG capsule Take 40 mg by mouth daily.    . nitroGLYCERIN (NITROSTAT) 0.4 MG SL tablet Place 1 tablet (0.4 mg total) under the tongue every 5 (five) minutes x 3 doses as needed for chest pain (if no relief after 3rd dose, call 911 or proceed to the ED for an evaluation). 25 tablet 2  . polyvinyl alcohol (LIQUIFILM TEARS) 1.4 % ophthalmic solution Place 2 drops into both eyes as needed for dry eyes (3-4 times weekly).    . potassium chloride (MICRO-K) 10 MEQ CR capsule Take 20 mEq by mouth 2 (two) times daily.      No current facility-administered medications for this visit.    Allergies:  Ivp dye [iodinated diagnostic agents]; Betadine [povidone iodine]; Codeine; Other; Penicillins; and Sulfa antibiotics   Social History: The patient  reports that she has never smoked. She has never used smokeless tobacco. She reports that she drinks alcohol. She reports that she does not use drugs.   ROS:  Please see the history of present illness. Otherwise, complete review of systems is positive for none.  All other systems are reviewed and negative.   Physical Exam: VS:  BP 116/62   Pulse 85   Ht 5' 3.5" (1.613 m)   Wt 137 lb (62.1 kg)   SpO2 97%   BMI 23.89 kg/m , BMI Body mass index is 23.89 kg/m.  Wt Readings from Last 3 Encounters:  01/30/18 137 lb (62.1 kg)  07/22/17 134 lb 3.2 oz (60.9 kg)  03/31/17 130 lb 1.1 oz (59 kg)    General: Patient appears comfortable at rest. HEENT: Conjunctiva and lids normal, oropharynx clear. Neck: Supple, no elevated JVP or carotid bruits, no thyromegaly. Lungs: Clear to auscultation, nonlabored breathing at  rest. Cardiac: Regular rate and rhythm, no S3, soft systolic murmur. Abdomen: Soft, nontender, bowel sounds present. Extremities: No pitting edema, distal pulses 2+. Skin: Warm and dry. Musculoskeletal: No kyphosis. Neuropsychiatric: Alert and oriented x3, affect grossly appropriate.  ECG: I personally reviewed the tracing from 03/31/2017 which showed sinus rhythm with LVH and nonspecific ST changes.  Recent Labwork: 03/31/2017: BUN 12; Creatinine, Ser 0.80; Hemoglobin 13.1; Platelets 336; Potassium 3.8; Sodium 142  Component Value Date/Time   CHOL 149 04/01/2017 0544   TRIG 147 04/01/2017 0544   HDL 39 (L) 04/01/2017 0544   CHOLHDL 3.8 04/01/2017 0544   VLDL 29 04/01/2017 0544   LDLCALC 81 04/01/2017 0544    Other Studies Reviewed Today:  Echocardiogram 11/05/2016: Study Conclusions  - Left ventricle: The cavity size was normal. There was mild focal basal hypertrophy of the septum. Systolic function was normal. The estimated ejection fraction was in the range of 60% to 65%. Wall motion was normal; there were no regional wall motion abnormalities. Features are consistent with a pseudonormal left ventricular filling pattern, with concomitant abnormal relaxation and increased filling pressure (grade 2 diastolic dysfunction). Doppler parameters are consistent with indeterminate ventricular filling pressure. - Aortic valve: Transvalvular velocity was within the normal range. There was no stenosis. There was no regurgitation. - Mitral valve: Mildly calcified annulus. Transvalvular velocity was within the normal range. There was no evidence for stenosis. There was mild regurgitation. - Left atrium: The atrium was moderately dilated. - Right ventricle: The cavity size was normal. Wall thickness was normal. Systolic function was normal. - Atrial septum: No defect or patent foramen ovale was identified by color flow Doppler. - Tricuspid valve: There  was trivial regurgitation. - Pulmonary arteries: Systolic pressure was within the normal range. PA peak pressure: 22 mm Hg (S).  Carotid Dopplers 11/06/2016: Summary: Bilateral - 1% to 39% ICA stenosis. Vertebral artery flow is antegrade.  Assessment and Plan:  1.  CAD status post CABG in 2006.  She had a recent episode of possible angina, although somewhat atypical in description.  This has not been recurring.  ECG reviewed and stable.  Would continue medical therapy and observation, prescription provided for sublingual nitroglycerin.  2.  Essential hypertension, blood pressure is well controlled today on current regimen.  Keep follow-up with Dr. Sherryll BurgerShah.  3.  Mixed hyperlipidemia on Lipitor.  Requesting recent lipid panel.  4.  Mild, asymptomatic carotid artery disease.  Continue aspirin and statin.  Current medicines were reviewed with the patient today.   Orders Placed This Encounter  Procedures  . EKG 12-Lead    Disposition: Follow-up in 6 months.  Signed, Jonelle SidleSamuel G. McDowell, MD, [facility]ACC 01/30/2018 2:37 PM    [facility] [address], [place], [place] [address] Phone: [phone]; Fax: [phone]

## 2018-01-30 ENCOUNTER — Encounter: Payer: Self-pay | Admitting: Cardiology

## 2018-01-30 ENCOUNTER — Ambulatory Visit (INDEPENDENT_AMBULATORY_CARE_PROVIDER_SITE_OTHER): Payer: Medicare HMO | Admitting: Cardiology

## 2018-01-30 ENCOUNTER — Encounter: Payer: Self-pay | Admitting: *Deleted

## 2018-01-30 VITALS — BP 116/62 | HR 85 | Ht 63.5 in | Wt 137.0 lb

## 2018-01-30 DIAGNOSIS — E782 Mixed hyperlipidemia: Secondary | ICD-10-CM

## 2018-01-30 DIAGNOSIS — I6523 Occlusion and stenosis of bilateral carotid arteries: Secondary | ICD-10-CM

## 2018-01-30 DIAGNOSIS — I25119 Atherosclerotic heart disease of native coronary artery with unspecified angina pectoris: Secondary | ICD-10-CM

## 2018-01-30 DIAGNOSIS — I10 Essential (primary) hypertension: Secondary | ICD-10-CM

## 2018-01-30 MED ORDER — NITROGLYCERIN 0.4 MG SL SUBL: 0.4000 mg | SUBLINGUAL_TABLET | SUBLINGUAL | 2 refills | Status: DC | PRN

## 2018-01-30 NOTE — Patient Instructions (Signed)
Medication Instructions:   Your physician recommends that you continue on your current medications as directed. Please refer to the Current Medication list given to you today.  Labwork:  NONE-recent lab work requested from your family doctor.  Testing/Procedures:  NONE  Follow-Up:  Your physician recommends that you schedule a follow-up appointment in: 6 months. You will receive a reminder letter in the mail in about 4 months reminding you to call and schedule your appointment. If you don't receive this letter, please contact our office.  Any Other Special Instructions Will Be Listed Below (If Applicable).  If you need a refill on your cardiac medications before your next appointment, please call your pharmacy.

## 2018-05-25 DIAGNOSIS — Z789 Other specified health status: Secondary | ICD-10-CM | POA: Diagnosis not present

## 2018-05-25 DIAGNOSIS — I10 Essential (primary) hypertension: Secondary | ICD-10-CM | POA: Diagnosis not present

## 2018-05-25 DIAGNOSIS — Z6825 Body mass index (BMI) 25.0-25.9, adult: Secondary | ICD-10-CM | POA: Diagnosis not present

## 2018-05-25 DIAGNOSIS — E7800 Pure hypercholesterolemia, unspecified: Secondary | ICD-10-CM | POA: Diagnosis not present

## 2018-05-25 DIAGNOSIS — I509 Heart failure, unspecified: Secondary | ICD-10-CM | POA: Diagnosis not present

## 2018-05-25 DIAGNOSIS — Z299 Encounter for prophylactic measures, unspecified: Secondary | ICD-10-CM | POA: Diagnosis not present

## 2018-05-25 DIAGNOSIS — M79606 Pain in leg, unspecified: Secondary | ICD-10-CM | POA: Diagnosis not present

## 2018-06-11 DIAGNOSIS — I10 Essential (primary) hypertension: Secondary | ICD-10-CM | POA: Diagnosis not present

## 2018-06-11 DIAGNOSIS — Z6824 Body mass index (BMI) 24.0-24.9, adult: Secondary | ICD-10-CM | POA: Diagnosis not present

## 2018-06-11 DIAGNOSIS — J449 Chronic obstructive pulmonary disease, unspecified: Secondary | ICD-10-CM | POA: Diagnosis not present

## 2018-06-11 DIAGNOSIS — I509 Heart failure, unspecified: Secondary | ICD-10-CM | POA: Diagnosis not present

## 2018-06-11 DIAGNOSIS — J069 Acute upper respiratory infection, unspecified: Secondary | ICD-10-CM | POA: Diagnosis not present

## 2018-06-11 DIAGNOSIS — Z789 Other specified health status: Secondary | ICD-10-CM | POA: Diagnosis not present

## 2018-06-11 DIAGNOSIS — E7800 Pure hypercholesterolemia, unspecified: Secondary | ICD-10-CM | POA: Diagnosis not present

## 2018-06-11 DIAGNOSIS — Z299 Encounter for prophylactic measures, unspecified: Secondary | ICD-10-CM | POA: Diagnosis not present

## 2018-06-11 DIAGNOSIS — I209 Angina pectoris, unspecified: Secondary | ICD-10-CM | POA: Diagnosis not present

## 2018-07-16 DIAGNOSIS — Z6824 Body mass index (BMI) 24.0-24.9, adult: Secondary | ICD-10-CM | POA: Diagnosis not present

## 2018-07-16 DIAGNOSIS — Z299 Encounter for prophylactic measures, unspecified: Secondary | ICD-10-CM | POA: Diagnosis not present

## 2018-07-16 DIAGNOSIS — E7800 Pure hypercholesterolemia, unspecified: Secondary | ICD-10-CM | POA: Diagnosis not present

## 2018-07-16 DIAGNOSIS — R58 Hemorrhage, not elsewhere classified: Secondary | ICD-10-CM | POA: Diagnosis not present

## 2018-07-16 DIAGNOSIS — J449 Chronic obstructive pulmonary disease, unspecified: Secondary | ICD-10-CM | POA: Diagnosis not present

## 2018-07-16 DIAGNOSIS — I509 Heart failure, unspecified: Secondary | ICD-10-CM | POA: Diagnosis not present

## 2018-07-16 DIAGNOSIS — I10 Essential (primary) hypertension: Secondary | ICD-10-CM | POA: Diagnosis not present

## 2018-07-22 DIAGNOSIS — Z299 Encounter for prophylactic measures, unspecified: Secondary | ICD-10-CM | POA: Diagnosis not present

## 2018-07-22 DIAGNOSIS — Z6824 Body mass index (BMI) 24.0-24.9, adult: Secondary | ICD-10-CM | POA: Diagnosis not present

## 2018-07-22 DIAGNOSIS — I10 Essential (primary) hypertension: Secondary | ICD-10-CM | POA: Diagnosis not present

## 2018-07-22 DIAGNOSIS — I509 Heart failure, unspecified: Secondary | ICD-10-CM | POA: Diagnosis not present

## 2018-07-22 DIAGNOSIS — M542 Cervicalgia: Secondary | ICD-10-CM | POA: Diagnosis not present

## 2018-08-17 DIAGNOSIS — Z299 Encounter for prophylactic measures, unspecified: Secondary | ICD-10-CM | POA: Diagnosis not present

## 2018-08-17 DIAGNOSIS — R61 Generalized hyperhidrosis: Secondary | ICD-10-CM | POA: Diagnosis not present

## 2018-08-17 DIAGNOSIS — M722 Plantar fascial fibromatosis: Secondary | ICD-10-CM | POA: Diagnosis not present

## 2018-08-17 DIAGNOSIS — I10 Essential (primary) hypertension: Secondary | ICD-10-CM | POA: Diagnosis not present

## 2018-08-17 DIAGNOSIS — Z6824 Body mass index (BMI) 24.0-24.9, adult: Secondary | ICD-10-CM | POA: Diagnosis not present

## 2018-08-17 DIAGNOSIS — L84 Corns and callosities: Secondary | ICD-10-CM | POA: Diagnosis not present

## 2018-10-06 DIAGNOSIS — S99911A Unspecified injury of right ankle, initial encounter: Secondary | ICD-10-CM | POA: Diagnosis not present

## 2018-10-06 DIAGNOSIS — Z299 Encounter for prophylactic measures, unspecified: Secondary | ICD-10-CM | POA: Diagnosis not present

## 2018-10-06 DIAGNOSIS — I509 Heart failure, unspecified: Secondary | ICD-10-CM | POA: Diagnosis not present

## 2018-10-06 DIAGNOSIS — M79671 Pain in right foot: Secondary | ICD-10-CM | POA: Diagnosis not present

## 2018-10-06 DIAGNOSIS — M25571 Pain in right ankle and joints of right foot: Secondary | ICD-10-CM | POA: Diagnosis not present

## 2018-10-06 DIAGNOSIS — I10 Essential (primary) hypertension: Secondary | ICD-10-CM | POA: Diagnosis not present

## 2018-10-06 DIAGNOSIS — S99921A Unspecified injury of right foot, initial encounter: Secondary | ICD-10-CM | POA: Diagnosis not present

## 2018-10-06 DIAGNOSIS — M25471 Effusion, right ankle: Secondary | ICD-10-CM | POA: Diagnosis not present

## 2018-10-06 DIAGNOSIS — Z6824 Body mass index (BMI) 24.0-24.9, adult: Secondary | ICD-10-CM | POA: Diagnosis not present

## 2018-10-06 DIAGNOSIS — R252 Cramp and spasm: Secondary | ICD-10-CM | POA: Diagnosis not present

## 2018-10-06 DIAGNOSIS — E876 Hypokalemia: Secondary | ICD-10-CM | POA: Diagnosis not present

## 2018-10-22 ENCOUNTER — Telehealth: Payer: Self-pay | Admitting: Cardiology

## 2018-10-22 NOTE — Telephone Encounter (Signed)
Virtual Visit Pre-Appointment Phone Call  "(Name), I am calling you today to discuss your upcoming appointment. We are currently trying to limit exposure to the virus that causes COVID-19 by seeing patients at home rather than in the office."  1. "What is the BEST phone number to call the day of the visit?" - include this in appointment notes  2. Do you have or have access to (through a family member/friend) a smartphone with video capability that we can use for your visit?" a. If yes - list this number in appt notes as cell (if different from BEST phone #) and list the appointment type as a VIDEO visit in appointment notes b. If no - list the appointment type as a PHONE visit in appointment notes  3. Confirm consent - "In the setting of the current Covid19 crisis, you are scheduled for a (phone or video) visit with your provider on (date) at (time).  Just as we do with many in-office visits, in order for you to participate in this visit, we must obtain consent.  If you'd like, I can send this to your mychart (if signed up) or email for you to review.  Otherwise, I can obtain your verbal consent now.  All virtual visits are billed to your insurance company just like a normal visit would be.  By agreeing to a virtual visit, we'd like you to understand that the technology does not allow for your provider to perform an examination, and thus may limit your provider's ability to fully assess your condition. If your provider identifies any concerns that need to be evaluated in person, we will make arrangements to do so.  Finally, though the technology is pretty good, we cannot assure that it will always work on either your or our end, and in the setting of a video visit, we may have to convert it to a phone-only visit.  In either situation, we cannot ensure that we have a secure connection.  Are you willing to proceed?" STAFF: Did the patient verbally acknowledge consent to telehealth visit? Document  YES/NO here: Yes  4. Advise patient to be prepared - "Two hours prior to your appointment, go ahead and check your blood pressure, pulse, oxygen saturation, and your weight (if you have the equipment to check those) and write them all down. When your visit starts, your provider will ask you for this information. If you have an Apple Watch or Kardia device, please plan to have heart rate information ready on the day of your appointment. Please have a pen and paper handy nearby the day of the visit as well."  5. Give patient instructions for MyChart download to smartphone OR [email or web address] as below if video visit (depending on what platform provider is using)  6. Inform patient they will receive a phone call 15 minutes prior to their appointment time (may be from unknown caller ID) so they should be prepared to answer    TELEPHONE CALL NOTE  Debbie Debbie [name] has been deemed a candidate for a follow-up tele-health visit to limit community exposure during the Covid-19 pandemic. I spoke with the patient via phone to ensure availability of phone/video source, confirm preferred email & phone number, and discuss instructions and expectations.  I reminded Debbie [name] to be prepared with any vital sign and/or heart rhythm information that could potentially be obtained via home monitoring, at the time of her visit. I reminded Debbie [name] to expect a phone call prior to  her visit.  Geraldine Contras 10/22/2018 2:08 PM   INSTRUCTIONS FOR DOWNLOADING THE MYCHART APP TO SMARTPHONE  - The patient must first make sure to have activated MyChart and know their login information - If Apple, go to [employer] and type in MyChart in the search bar and download the app. If Android, ask patient to go to [employer] and type in [place] in the search bar and download the app. The app is free but as with any other app downloads, their phone may require them to verify saved payment information or  Apple/Android password.  - The patient will need to then log into the app with their MyChart username and password, and select [facility] as their healthcare provider to link the account. When it is time for your visit, go to the MyChart app, find appointments, and click Begin Video Visit. Be sure to Select Allow for your device to access the Microphone and Camera for your visit. You will then be connected, and your provider will be with you shortly.  **If they have any issues connecting, or need assistance please contact MyChart service desk [phone])**  **If using a computer, in order to ensure the best quality for their visit they will need to use either of the following Internet Browsers: [employer] or Google Chrome**  IF USING DOXIMITY or DOXY.ME - The patient will receive a link just prior to their visit by text.     FULL LENGTH CONSENT FOR TELE-HEALTH VISIT   I hereby voluntarily request, consent and authorize [facility] and its employed or contracted physicians, physician assistants, nurse practitioners or other licensed health care professionals (the Practitioner), to provide me with telemedicine health care services (the Services") as deemed necessary by the treating Practitioner. I acknowledge and consent to receive the Services by the Practitioner via telemedicine. I understand that the telemedicine visit will involve communicating with the Practitioner through live audiovisual communication technology and the disclosure of certain medical information by electronic transmission. I acknowledge that I have been given the opportunity to request an in-person assessment or other available alternative prior to the telemedicine visit and am voluntarily participating in the telemedicine visit.  I understand that I have the right to withhold or withdraw my consent to the use of telemedicine in the course of my care at any time, without affecting my right to future care  or treatment, and that the Practitioner or I may terminate the telemedicine visit at any time. I understand that I have the right to inspect all information obtained and/or recorded in the course of the telemedicine visit and may receive copies of available information for a reasonable fee.  I understand that some of the potential risks of receiving the Services via telemedicine include:   Delay or interruption in medical evaluation due to technological equipment failure or disruption;  Information transmitted may not be sufficient (e.g. poor resolution of images) to allow for appropriate medical decision making by the Practitioner; and/or   In rare instances, security protocols could fail, causing a breach of personal health information.  Furthermore, I acknowledge that it is my responsibility to provide information about my medical history, conditions and care that is complete and accurate to the best of my ability. I acknowledge that Practitioner's advice, recommendations, and/or decision may be based on factors not within their control, such as incomplete or inaccurate data provided by me or distortions of diagnostic images or specimens that may result from electronic transmissions. I  understand that the practice of medicine is not an exact science and that Practitioner makes no warranties or guarantees regarding treatment outcomes. I acknowledge that I will receive a copy of this consent concurrently upon execution via email to the email address I last provided but may also request a printed copy by calling the office of [place].    I understand that my insurance will be billed for this visit.   I have read or had this consent read to me.  I understand the contents of this consent, which adequately explains the benefits and risks of the Services being provided via telemedicine.   I have been provided ample opportunity to ask questions regarding this consent and the Services and have had  my questions answered to my satisfaction.  I give my informed consent for the services to be provided through the use of telemedicine in my medical care  By participating in this telemedicine visit I agree to the above.

## 2018-10-23 ENCOUNTER — Encounter: Payer: Medicare HMO | Admitting: Cardiology

## 2018-10-23 DIAGNOSIS — Z6824 Body mass index (BMI) 24.0-24.9, adult: Secondary | ICD-10-CM | POA: Diagnosis not present

## 2018-10-23 DIAGNOSIS — I509 Heart failure, unspecified: Secondary | ICD-10-CM | POA: Diagnosis not present

## 2018-10-23 DIAGNOSIS — M25471 Effusion, right ankle: Secondary | ICD-10-CM | POA: Diagnosis not present

## 2018-10-23 DIAGNOSIS — M79671 Pain in right foot: Secondary | ICD-10-CM | POA: Diagnosis not present

## 2018-10-23 DIAGNOSIS — I10 Essential (primary) hypertension: Secondary | ICD-10-CM | POA: Diagnosis not present

## 2018-10-23 DIAGNOSIS — Z299 Encounter for prophylactic measures, unspecified: Secondary | ICD-10-CM | POA: Diagnosis not present

## 2018-10-23 NOTE — Progress Notes (Signed)
This encounter was created in error - please disregard.

## 2018-10-27 DIAGNOSIS — I10 Essential (primary) hypertension: Secondary | ICD-10-CM | POA: Diagnosis not present

## 2018-10-27 DIAGNOSIS — Z299 Encounter for prophylactic measures, unspecified: Secondary | ICD-10-CM | POA: Diagnosis not present

## 2018-10-27 DIAGNOSIS — I509 Heart failure, unspecified: Secondary | ICD-10-CM | POA: Diagnosis not present

## 2018-10-27 DIAGNOSIS — Z6824 Body mass index (BMI) 24.0-24.9, adult: Secondary | ICD-10-CM | POA: Diagnosis not present

## 2018-10-27 DIAGNOSIS — M779 Enthesopathy, unspecified: Secondary | ICD-10-CM | POA: Diagnosis not present

## 2018-10-27 DIAGNOSIS — J449 Chronic obstructive pulmonary disease, unspecified: Secondary | ICD-10-CM | POA: Diagnosis not present

## 2018-12-30 ENCOUNTER — Telehealth: Payer: Self-pay | Admitting: Cardiology

## 2018-12-30 ENCOUNTER — Encounter: Payer: Self-pay | Admitting: *Deleted

## 2018-12-30 NOTE — Telephone Encounter (Signed)
    Agree with recommendations as it sounds like her clinical symptoms and weight are both improving with dose adjustment of Lasix. Thank you for requesting records and arranging close follow-up.   Signed, Erma Heritage, PA-C 12/30/2018, 2:06 PM Pager: [phone]

## 2018-12-30 NOTE — Telephone Encounter (Signed)
Patient called stating that she continues to have swelling from her knees to her feet. States that she saw Dr. Manuella Ghazi on Monday. He ordered chest xray and blood. States that she is having some shortness of breath.

## 2018-12-30 NOTE — Telephone Encounter (Signed)
Saw Dr. Manuella Ghazi on 08/10 and weighed 134.8 lbs that day. Today weighs 128.6 lbs. Manuella Ghazi increased lasix to 80 mg daily from 60 mg on Monday. Says sob and swelling has improved. Appointment given for VV with Domenic Polite on 01/06/2019 and advised if sob gets worse to go to the ED for an evaluation. Verbalized understanding. Lab work requested from PCP.

## 2018-12-31 ENCOUNTER — Encounter (HOSPITAL_COMMUNITY): Payer: Self-pay | Admitting: Emergency Medicine

## 2018-12-31 ENCOUNTER — Other Ambulatory Visit: Payer: Self-pay

## 2018-12-31 ENCOUNTER — Inpatient Hospital Stay (HOSPITAL_COMMUNITY)
Admission: EM | Admit: 2018-12-31 | Discharge: 2019-01-03 | DRG: 190 | Disposition: A | Discharge status: Home Health | Payer: Medicare Other | Attending: Family Medicine | Admitting: Family Medicine

## 2018-12-31 ENCOUNTER — Emergency Department (HOSPITAL_COMMUNITY): Payer: Medicare Other

## 2018-12-31 DIAGNOSIS — J441 Chronic obstructive pulmonary disease with (acute) exacerbation: Secondary | ICD-10-CM | POA: Diagnosis not present

## 2018-12-31 DIAGNOSIS — E039 Hypothyroidism, unspecified: Secondary | ICD-10-CM | POA: Diagnosis present

## 2018-12-31 DIAGNOSIS — E876 Hypokalemia: Secondary | ICD-10-CM | POA: Diagnosis not present

## 2018-12-31 DIAGNOSIS — Z7982 Long term (current) use of aspirin: Secondary | ICD-10-CM

## 2018-12-31 DIAGNOSIS — J449 Chronic obstructive pulmonary disease, unspecified: Secondary | ICD-10-CM | POA: Diagnosis present

## 2018-12-31 DIAGNOSIS — Z951 Presence of aortocoronary bypass graft: Secondary | ICD-10-CM

## 2018-12-31 DIAGNOSIS — M810 Age-related osteoporosis without current pathological fracture: Secondary | ICD-10-CM | POA: Diagnosis present

## 2018-12-31 DIAGNOSIS — R0902 Hypoxemia: Secondary | ICD-10-CM

## 2018-12-31 DIAGNOSIS — Z20828 Contact with and (suspected) exposure to other viral communicable diseases: Secondary | ICD-10-CM | POA: Diagnosis present

## 2018-12-31 DIAGNOSIS — Z981 Arthrodesis status: Secondary | ICD-10-CM

## 2018-12-31 DIAGNOSIS — R600 Localized edema: Secondary | ICD-10-CM | POA: Diagnosis present

## 2018-12-31 DIAGNOSIS — Z7951 Long term (current) use of inhaled steroids: Secondary | ICD-10-CM

## 2018-12-31 DIAGNOSIS — R0600 Dyspnea, unspecified: Secondary | ICD-10-CM | POA: Diagnosis present

## 2018-12-31 DIAGNOSIS — E782 Mixed hyperlipidemia: Secondary | ICD-10-CM | POA: Diagnosis present

## 2018-12-31 DIAGNOSIS — E785 Hyperlipidemia, unspecified: Secondary | ICD-10-CM | POA: Diagnosis present

## 2018-12-31 DIAGNOSIS — Z833 Family history of diabetes mellitus: Secondary | ICD-10-CM

## 2018-12-31 DIAGNOSIS — I10 Essential (primary) hypertension: Secondary | ICD-10-CM | POA: Diagnosis present

## 2018-12-31 DIAGNOSIS — K219 Gastro-esophageal reflux disease without esophagitis: Secondary | ICD-10-CM | POA: Diagnosis present

## 2018-12-31 DIAGNOSIS — I509 Heart failure, unspecified: Secondary | ICD-10-CM

## 2018-12-31 DIAGNOSIS — J9601 Acute respiratory failure with hypoxia: Secondary | ICD-10-CM | POA: Diagnosis present

## 2018-12-31 DIAGNOSIS — F419 Anxiety disorder, unspecified: Secondary | ICD-10-CM | POA: Diagnosis present

## 2018-12-31 DIAGNOSIS — I2510 Atherosclerotic heart disease of native coronary artery without angina pectoris: Secondary | ICD-10-CM | POA: Diagnosis present

## 2018-12-31 DIAGNOSIS — R609 Edema, unspecified: Secondary | ICD-10-CM | POA: Diagnosis not present

## 2018-12-31 LAB — CBC WITH DIFFERENTIAL/PLATELET
Abs Immature Granulocytes: 0.03 10*3/uL (ref 0.00–0.07)
Basophils Absolute: 0.1 10*3/uL (ref 0.0–0.1)
Basophils Relative: 1 %
Eosinophils Absolute: 0.2 10*3/uL (ref 0.0–0.5)
Eosinophils Relative: 2 %
HCT: 39.6 % (ref 36.0–46.0)
Hemoglobin: 12.4 g/dL (ref 12.0–15.0)
Immature Granulocytes: 0 %
Lymphocytes Relative: 25 %
Lymphs Abs: 2.5 10*3/uL (ref 0.7–4.0)
MCH: 27.5 pg (ref 26.0–34.0)
MCHC: 31.3 g/dL (ref 30.0–36.0)
MCV: 87.8 fL (ref 80.0–100.0)
Monocytes Absolute: 1.3 10*3/uL — ABNORMAL HIGH (ref 0.1–1.0)
Monocytes Relative: 13 %
Neutro Abs: 5.7 10*3/uL (ref 1.7–7.7)
Neutrophils Relative %: 59 %
Platelets: 372 10*3/uL (ref 150–400)
RBC: 4.51 MIL/uL (ref 3.87–5.11)
RDW: 13.7 % (ref 11.5–15.5)
WBC: 9.8 10*3/uL (ref 4.0–10.5)
nRBC: 0 % (ref 0.0–0.2)

## 2018-12-31 LAB — URINALYSIS, ROUTINE W REFLEX MICROSCOPIC
Bilirubin Urine: NEGATIVE
Glucose, UA: NEGATIVE mg/dL
Hgb urine dipstick: NEGATIVE
Ketones, ur: NEGATIVE mg/dL
Leukocytes,Ua: NEGATIVE
Nitrite: NEGATIVE
Protein, ur: NEGATIVE mg/dL
Specific Gravity, Urine: 1.008 (ref 1.005–1.030)
pH: 6 (ref 5.0–8.0)

## 2018-12-31 LAB — SARS CORONAVIRUS 2 BY RT PCR (HOSPITAL ORDER, PERFORMED IN ~~LOC~~ HOSPITAL LAB): SARS Coronavirus 2: NEGATIVE

## 2018-12-31 LAB — TROPONIN I (HIGH SENSITIVITY)
Troponin I (High Sensitivity): 4 ng/L (ref <18)
Troponin I (High Sensitivity): 5 ng/L (ref <18)

## 2018-12-31 LAB — BASIC METABOLIC PANEL
Anion gap: 8 (ref 5–15)
BUN: 17 mg/dL (ref 8–23)
CO2: 27 mmol/L (ref 22–32)
Calcium: 9.1 mg/dL (ref 8.9–10.3)
Chloride: 106 mmol/L (ref 98–111)
Creatinine, Ser: 0.8 mg/dL (ref 0.44–1.00)
GFR calc Af Amer: >60 mL/min (ref >60)
GFR calc non Af Amer: >60 mL/min (ref >60)
Glucose, Bld: 117 mg/dL — ABNORMAL HIGH (ref 70–99)
Potassium: 3.3 mmol/L — ABNORMAL LOW (ref 3.5–5.1)
Sodium: 141 mmol/L (ref 135–145)

## 2018-12-31 LAB — BRAIN NATRIURETIC PEPTIDE: B Natriuretic Peptide: 46 pg/mL (ref 0.0–100.0)

## 2018-12-31 MED ORDER — METHYLPREDNISOLONE SODIUM SUCC 125 MG IJ SOLR
125.0000 mg | Freq: Once | INTRAMUSCULAR | Status: AC
  Administered 2018-12-31: 23:00:00 125 mg via INTRAVENOUS
  Filled 2018-12-31: qty 2

## 2018-12-31 MED ORDER — FLUTICASONE PROPIONATE 50 MCG/ACT NA SUSP
2.0000 | Freq: Every day | NASAL | Status: DC
  Administered 2019-01-01 – 2019-01-03 (×3): 2 via NASAL
  Filled 2018-12-31 (×2): qty 16

## 2018-12-31 MED ORDER — FUROSEMIDE 40 MG PO TABS
40.0000 mg | ORAL_TABLET | Freq: Every day | ORAL | Status: DC
  Administered 2019-01-01 – 2019-01-03 (×3): 40 mg via ORAL
  Filled 2018-12-31 (×3): qty 1

## 2018-12-31 MED ORDER — SODIUM CHLORIDE 0.9% FLUSH: 3.0000 mL | Freq: Once | INTRAVENOUS | Status: DC

## 2018-12-31 MED ORDER — SODIUM CHLORIDE 0.9% FLUSH
3.0000 mL | Freq: Two times a day (BID) | INTRAVENOUS | Status: DC
  Administered 2018-12-31 – 2019-01-03 (×6): 3 mL via INTRAVENOUS

## 2018-12-31 MED ORDER — ASPIRIN 81 MG PO CHEW
81.0000 mg | CHEWABLE_TABLET | Freq: Every day | ORAL | Status: DC
  Administered 2019-01-01 – 2019-01-03 (×3): 81 mg via ORAL
  Filled 2018-12-31 (×3): qty 1

## 2018-12-31 MED ORDER — VITAMIN D 25 MCG (1000 UNIT) PO TABS
1000.0000 [IU] | ORAL_TABLET | Freq: Every day | ORAL | Status: DC
  Administered 2019-01-01 – 2019-01-03 (×3): 1000 [IU] via ORAL
  Filled 2018-12-31 (×3): qty 1

## 2018-12-31 MED ORDER — MONTELUKAST SODIUM 10 MG PO TABS
10.0000 mg | ORAL_TABLET | Freq: Every day | ORAL | Status: DC
  Administered 2018-12-31 – 2019-01-02 (×3): 10 mg via ORAL
  Filled 2018-12-31 (×3): qty 1

## 2018-12-31 MED ORDER — FAMOTIDINE 20 MG PO TABS
20.0000 mg | ORAL_TABLET | Freq: Every day | ORAL | Status: DC
  Administered 2019-01-01 – 2019-01-03 (×3): 20 mg via ORAL
  Filled 2018-12-31 (×3): qty 1

## 2018-12-31 MED ORDER — SODIUM CHLORIDE 0.9 % IV SOLN: 250.0000 mL | INTRAVENOUS | Status: DC | PRN

## 2018-12-31 MED ORDER — ALBUTEROL SULFATE (2.5 MG/3ML) 0.083% IN NEBU: 2.5000 mg | INHALATION_SOLUTION | Freq: Four times a day (QID) | RESPIRATORY_TRACT | Status: DC | PRN

## 2018-12-31 MED ORDER — ALBUTEROL (5 MG/ML) CONTINUOUS INHALATION SOLN
10.0000 mg/h | INHALATION_SOLUTION | Freq: Once | RESPIRATORY_TRACT | Status: DC
  Filled 2018-12-31: qty 20

## 2018-12-31 MED ORDER — ALBUTEROL SULFATE (2.5 MG/3ML) 0.083% IN NEBU
2.5000 mg | INHALATION_SOLUTION | Freq: Four times a day (QID) | RESPIRATORY_TRACT | Status: DC
  Administered 2019-01-01 – 2019-01-03 (×9): 2.5 mg via RESPIRATORY_TRACT
  Filled 2018-12-31 (×10): qty 3

## 2018-12-31 MED ORDER — ATORVASTATIN CALCIUM 40 MG PO TABS
80.0000 mg | ORAL_TABLET | Freq: Every day | ORAL | Status: DC
  Administered 2018-12-31 – 2019-01-02 (×3): 80 mg via ORAL
  Filled 2018-12-31 (×3): qty 2

## 2018-12-31 MED ORDER — LISINOPRIL 10 MG PO TABS
20.0000 mg | ORAL_TABLET | Freq: Every day | ORAL | Status: DC
  Administered 2019-01-01 – 2019-01-03 (×3): 20 mg via ORAL
  Filled 2018-12-31 (×3): qty 2

## 2018-12-31 MED ORDER — ISOSORBIDE MONONITRATE ER 60 MG PO TB24
30.0000 mg | ORAL_TABLET | Freq: Every day | ORAL | Status: DC
  Administered 2019-01-01 – 2019-01-03 (×3): 30 mg via ORAL
  Filled 2018-12-31 (×3): qty 1

## 2018-12-31 MED ORDER — AMLODIPINE BESYLATE 5 MG PO TABS
10.0000 mg | ORAL_TABLET | Freq: Every day | ORAL | Status: DC
  Administered 2019-01-01 – 2019-01-02 (×2): 10 mg via ORAL
  Filled 2018-12-31 (×2): qty 2

## 2018-12-31 MED ORDER — BACLOFEN 10 MG PO TABS
10.0000 mg | ORAL_TABLET | Freq: Every day | ORAL | Status: DC
  Administered 2019-01-01 – 2019-01-03 (×3): 10 mg via ORAL
  Filled 2018-12-31 (×3): qty 1

## 2018-12-31 MED ORDER — IPRATROPIUM BROMIDE 0.02 % IN SOLN: 1.0000 mg | Freq: Once | RESPIRATORY_TRACT | Status: DC

## 2018-12-31 MED ORDER — LORATADINE 10 MG PO TABS
10.0000 mg | ORAL_TABLET | Freq: Every day | ORAL | Status: DC
  Administered 2019-01-01 – 2019-01-03 (×3): 10 mg via ORAL
  Filled 2018-12-31 (×4): qty 1

## 2018-12-31 MED ORDER — SODIUM CHLORIDE 0.9% FLUSH: 3.0000 mL | INTRAVENOUS | Status: DC | PRN

## 2018-12-31 MED ORDER — ENOXAPARIN SODIUM 40 MG/0.4ML ~~LOC~~ SOLN
40.0000 mg | SUBCUTANEOUS | Status: DC
  Administered 2018-12-31 – 2019-01-02 (×3): 40 mg via SUBCUTANEOUS
  Filled 2018-12-31 (×3): qty 0.4

## 2018-12-31 MED ORDER — ACETAMINOPHEN 325 MG PO TABS: 650.0000 mg | ORAL_TABLET | Freq: Four times a day (QID) | ORAL | Status: DC | PRN

## 2018-12-31 MED ORDER — ACETAMINOPHEN 650 MG RE SUPP: 650.0000 mg | Freq: Four times a day (QID) | RECTAL | Status: DC | PRN

## 2018-12-31 MED ORDER — ALPRAZOLAM 0.25 MG PO TABS
0.2500 mg | ORAL_TABLET | Freq: Two times a day (BID) | ORAL | Status: DC | PRN
  Administered 2018-12-31 – 2019-01-03 (×3): 0.25 mg via ORAL
  Filled 2018-12-31 (×3): qty 1

## 2018-12-31 MED ORDER — UMECLIDINIUM-VILANTEROL 62.5-25 MCG/INH IN AEPB
1.0000 | INHALATION_SPRAY | Freq: Every day | RESPIRATORY_TRACT | Status: DC
  Administered 2019-01-01 – 2019-01-03 (×3): 1 via RESPIRATORY_TRACT
  Filled 2018-12-31: qty 14

## 2018-12-31 MED ORDER — POLYVINYL ALCOHOL 1.4 % OP SOLN: 2.0000 [drp] | OPHTHALMIC | Status: DC | PRN

## 2018-12-31 MED ORDER — LEVOTHYROXINE SODIUM 88 MCG PO TABS
88.0000 ug | ORAL_TABLET | Freq: Every day | ORAL | Status: DC
  Administered 2019-01-01 – 2019-01-03 (×3): 88 ug via ORAL
  Filled 2018-12-31 (×3): qty 1

## 2018-12-31 MED ORDER — POTASSIUM CHLORIDE CRYS ER 20 MEQ PO TBCR
20.0000 meq | EXTENDED_RELEASE_TABLET | Freq: Two times a day (BID) | ORAL | Status: DC
  Administered 2018-12-31 – 2019-01-03 (×6): 20 meq via ORAL
  Filled 2018-12-31 (×6): qty 1

## 2018-12-31 NOTE — H&P (Signed)
TRH H&P    Patient Demographics:    Debbie [name], is a 78 y.o. female  MRN: [id number]  DOB - Jun 18, 1940  Admit Date - 12/31/2018  Referring MD/NP/PA: Samuel Jester  Outpatient Primary MD for the patient is [name], MD  Patient coming from: home  Chief complaint- dyspnea   HPI:    Debbie [name]  is a 78 y.o. female,  w hypertension, Hyperlipidemia, glucose intolerance (hga1c=6.1 11/06/2016), CAD, s/p CABG, carotid stenosis, gerd,  hypothyroidism, Copd not on home o2, apparently c/o dyspnea and lower extremity swelling.  Pt presented to ED due to dyspnea with walking and generalized weakness of her legs.  Pt denies fever, chills, cough, cp, palp, n/v, abd pain, diarrhea, dysuria, hematuria, back pain, focal neurological symptoms.    Pt notes that she has discontinued about 5 inhalers that she was previously taking.   In ED,  T 98.9, P 83 R 24, Bp 155/73  Pox 98% on RA  CXR IMPRESSION: No acute cardiopulmonary process.  Na 141, K 3.3, Bun 17, Creatinine 0.80 Wbc 9.8, Hgb 12.4, Plt 372 Urinalysis negative  Trop 4 BNP 46    Review of systems:    In addition to the HPI above,  No Fever-chills, No Headache, No changes with Vision or hearing, No problems swallowing food or Liquids, No Chest pain, Cough No Abdominal pain, No Nausea or Vomiting, bowel movements are regular, No Blood in stool or Urine, No dysuria, No new skin rashes or bruises, No new joints pains-aches,  No new weakness, tingling, numbness in any extremity, No recent weight gain or loss, No polyuria, polydypsia or polyphagia, No significant Mental Stressors.  All other systems reviewed and are negative.    Past History of the following :    Past Medical History:  Diagnosis Date  . Allergic rhinitis   . Anxiety   . Arthritis   . Asthma   . Bladder spasms   . CAD (coronary artery disease)    CABG  2006 [facility]  . Carotid artery disease (HCC)   . Collagen vascular disease (HCC)   . COPD (chronic obstructive pulmonary disease) (HCC)   . Cystocele   . Essential hypertension   . GERD (gastroesophageal reflux disease)   . Hyperlipidemia   . Hypothyroidism   . Hypothyroidism   . Osteoporosis   . Renal insufficiency   . Vaginal vault prolapse       Past Surgical History:  Procedure Laterality Date  . ANTERIOR AND POSTERIOR REPAIR N/A 04/06/2013   Procedure: CYSTOCELE REPAIR ;  Surgeon: Martina Sinner, MD;  Location: [facility] ORS;  Service: Urology;  Laterality: N/A;  . CATARACTS     REMOVED  . COLONOSCOPY    . CORNEA LACERATION REPAIR    . CORONARY ARTERY BYPASS GRAFT     X 5 VESSELS  . CYSTOSCOPY N/A 04/06/2013   Procedure: CYSTOSCOPY;  Surgeon: Martina Sinner, MD;  Location: [facility] ORS;  Service: Urology;  Laterality: N/A;  . HERNIA REPAIR    . SEPTOPLASTY N/A 04/28/2015  Procedure: SEPTOPLASTY;  Surgeon: Melvenia BeamMitchell Gore, MD;  [facility];  Service: ENT;  Laterality: N/A;  . SINUS ENDO W/FUSION Bilateral 04/28/2015   Procedure: ENDOSCOPIC SINUS SURGERY WITH NAVIGATION;  Surgeon: Melvenia BeamMitchell Gore, MD;  [facility];  Service: ENT;  Laterality: Bilateral;  . TUBAL LIGATION        Social History:      Social History   Tobacco Use  . Smoking status: Never Smoker  . Smokeless tobacco: Never Used  Substance Use Topics  . Alcohol use: Yes    Alcohol/week: 0.0 standard drinks    Comment: 1 drink 1 time a year       Family History :     Family History  Problem Relation Age of Onset  . Cirrhosis Father   . Lung disease Father   . Diabetes Mellitus II Mother   . Heart Problems Mother        Home Medications:   Prior to Admission medications   Medication Sig Start Date End Date Taking? Authorizing Provider  famotidine (PEPCID) 20 MG tablet Take 20 mg by mouth daily. 08/18/18  Yes [provider]  levothyroxine (SYNTHROID) 88 MCG tablet Take  88 mcg by mouth daily.  09/24/16  Yes [provider]  lisinopril (PRINIVIL,ZESTRIL) 20 MG tablet Take 1 tablet (20 mg total) by mouth daily. 11/06/16  Yes Vann, Jessica U, DO  montelukast (SINGULAIR) 10 MG tablet Take 10 mg by mouth at bedtime.   Yes [provider]  nitroGLYCERIN (NITROSTAT) 0.4 MG SL tablet Place 1 tablet (0.4 mg total) under the tongue every 5 (five) minutes x 3 doses as needed for chest pain (if no relief after 3rd dose, call 911 or proceed to the ED for an evaluation). 01/30/18  Yes Jonelle SidleMcDowell, Samuel G, MD  potassium chloride (MICRO-K) 10 MEQ CR capsule Take 20 mEq by mouth 2 (two) times daily.    Yes [provider]  ALPRAZolam (XANAX) 0.25 MG tablet Take 1 tablet (0.25 mg total) by mouth 2 (two) times daily as needed for anxiety. 11/06/16   Joseph ArtVann, Jessica U, DO  amLODipine (NORVASC) 10 MG tablet Take 1 tablet (10 mg total) daily by mouth. 04/01/17   Johnson, Clanford L, MD  aspirin 81 MG tablet Take 81 mg by mouth daily.    [provider]  atorvastatin (LIPITOR) 80 MG tablet Take 80 mg by mouth at bedtime.    [provider]  baclofen (LIORESAL) 10 MG tablet Take 10 mg by mouth daily. 10/31/16   [provider]  Biotin 5000 MCG CAPS Take 5,000 mcg by mouth daily.     [provider]  cetirizine (ZYRTEC) 10 MG tablet Take 10 mg by mouth daily as needed.     [provider]  cholecalciferol (VITAMIN D) 1000 units tablet Take 1,000 Units by mouth daily.    [provider]  fluticasone (FLONASE) 50 MCG/ACT nasal spray Place 2 sprays into the nose daily.    [provider]  Fluticasone-Salmeterol (ADVAIR) 250-50 MCG/DOSE AEPB Inhale 1 puff into the lungs every 12 (twelve) hours.    [provider]  furosemide (LASIX) 40 MG tablet Take 1 tablet (40 mg total) daily by mouth. Patient taking differently: Take 80 mg by mouth daily.  04/02/17   Johnson, Clanford L, MD  guaiFENesin (MUCINEX) 600  MG 12 hr tablet Take 1,200 mg by mouth 2 (two) times daily as needed for congestion.    [provider]  Hypromellose (  ARTIFICIAL TEARS OP) Place 1 drop into both eyes daily as needed (dry eyes).    [provider]  ibuprofen (ADVIL,MOTRIN) 200 MG tablet Take 1 tablet (200 mg total) by mouth every 8 (eight) hours as needed. Patient taking differently: Take 200 mg by mouth every 8 (eight) hours as needed for moderate pain.  12/18/15   Vickki HearingHarrison, Stanley E, MD  isosorbide mononitrate (IMDUR) 30 MG 24 hr tablet Take 30 mg by mouth daily. 09/24/16   [provider]  polyvinyl alcohol (LIQUIFILM TEARS) 1.4 % ophthalmic solution Place 2 drops into both eyes as needed for dry eyes (3-4 times weekly).    [provider]     Allergies:     Allergies  Allergen Reactions  . Ivp Dye [Iodinated Diagnostic Agents] Swelling    Kidney Dye  . Betadine [Povidone Iodine] Rash  . Codeine Nausea And Vomiting  . Other Other (See Comments)    ALL NARCOTICS  . Penicillins Rash  . Sulfa Antibiotics Rash     Physical Exam:   Vitals  Blood pressure (!) 160/73, pulse 81, temperature 98.9 F (37.2 C), temperature source Oral, resp. rate 14, weight 58.9 kg, SpO2 100 %.  1.  General: axoxo3  2. Psychiatric: euthymic  3. Neurologic: cn2-12 intact, reflexes 2+ symmetric, diffuse with no clonus, motor 5/5 in all 4 ext  4. HEENMT:  Anicteric, pupils 1.185mm symmetric, direct, consensual, near intact Small oropharynx (possibly has OSA) Neck: no jvd, no bruit  5. Respiratory : Prolonged exp phase, trace wheezing, no crackles.   6. Cardiovascular : Midline scar, rrr s1, s2, 1/6 sem apex  7. Gastrointestinal:  Abd: soft, obese, nt, nd, +bs  8. Skin:  Ext: no c/c/e,  No rash  9.Musculoskeletal:  Good ROM,  No adenopathy    Data Review:    CBC Recent Labs  Lab 12/31/18 1755  WBC 9.8  HGB 12.4  HCT 39.6  PLT 372  MCV 87.8  MCH 27.5  MCHC 31.3  RDW 13.7   LYMPHSABS 2.5  MONOABS 1.3*  EOSABS 0.2  BASOSABS 0.1   ------------------------------------------------------------------------------------------------------------------  Results for orders placed or performed during the hospital encounter of 12/31/18 (from the past 48 hour(s))  Basic metabolic panel     Status: Abnormal   Collection Time: 12/31/18  5:55 PM  Result Value Ref Range   Sodium 141 135 - 145 mmol/L   Potassium 3.3 (L) 3.5 - 5.1 mmol/L   Chloride 106 98 - 111 mmol/L   CO2 27 22 - 32 mmol/L   Glucose, Bld 117 (H) 70 - 99 mg/dL   BUN 17 8 - 23 mg/dL   Creatinine, Ser 1.300.80 0.44 - 1.00 mg/dL   Calcium 9.1 8.9 - 86.510.3 mg/dL   GFR calc non Af Amer >60 >60 mL/min   GFR calc Af Amer >60 >60 mL/min   Anion gap 8 5 - 15    Comment: Performed at [facility], [address] [place], [place] [address]  CBC with Differential     Status: Abnormal   Collection Time: 12/31/18  5:55 PM  Result Value Ref Range   WBC 9.8 4.0 - 10.5 K/uL   RBC 4.51 3.87 - 5.11 MIL/uL   Hemoglobin 12.4 12.0 - 15.0 g/dL   HCT 62.939.6 52.836.0 - 41.346.0 %   MCV 87.8 80.0 - 100.0 fL   MCH 27.5 26.0 - 34.0 pg   MCHC 31.3 30.0 - 36.0 g/dL   RDW 24.413.7 01.011.5 - 27.215.5 %  Platelets 372 150 - 400 K/uL   nRBC 0.0 0.0 - 0.2 %   Neutrophils Relative % 59 %   Neutro Abs 5.7 1.7 - 7.7 K/uL   Lymphocytes Relative 25 %   Lymphs Abs 2.5 0.7 - 4.0 K/uL   Monocytes Relative 13 %   Monocytes Absolute 1.3 (H) 0.1 - 1.0 K/uL   Eosinophils Relative 2 %   Eosinophils Absolute 0.2 0.0 - 0.5 K/uL   Basophils Relative 1 %   Basophils Absolute 0.1 0.0 - 0.1 K/uL   Immature Granulocytes 0 %   Abs Immature Granulocytes 0.03 0.00 - 0.07 K/uL    Comment: Performed at [facility], [address] [place], [place] [address]  Troponin I (High Sensitivity)     Status: None   Collection Time: 12/31/18  5:55 PM  Result Value Ref Range   Troponin I (High Sensitivity) 4 <18 ng/L    Comment: (NOTE) Elevated high sensitivity troponin I (hsTnI)  values and significant  changes across serial measurements may suggest ACS but many other  chronic and acute conditions are known to elevate hsTnI results.  Refer to the "Links" section for chest pain algorithms and additional  guidance. Performed at [facility], [address], [place], [place] [address]   Brain natriuretic peptide     Status: None   Collection Time: 12/31/18  5:55 PM  Result Value Ref Range   B Natriuretic Peptide 46.0 0.0 - 100.0 pg/mL    Comment: Performed at [facility], [address], [place], [place] [address]  Urinalysis, Routine w reflex microscopic     Status: None   Collection Time: 12/31/18  6:44 PM  Result Value Ref Range   Color, Urine YELLOW YELLOW   APPearance CLEAR CLEAR   Specific Gravity, Urine 1.008 1.005 - 1.030   pH 6.0 5.0 - 8.0   Glucose, UA NEGATIVE NEGATIVE mg/dL   Hgb urine dipstick NEGATIVE NEGATIVE   Bilirubin Urine NEGATIVE NEGATIVE   Ketones, ur NEGATIVE NEGATIVE mg/dL   Protein, ur NEGATIVE NEGATIVE mg/dL   Nitrite NEGATIVE NEGATIVE   Leukocytes,Ua NEGATIVE NEGATIVE    Comment: Performed at [facility], [address] [place], [place] [address]  SARS Coronavirus 2 [facility] order, Performed in [facility] hospital lab) Nasopharyngeal Nasopharyngeal Swab     Status: None   Collection Time: 12/31/18  6:53 PM   Specimen: Nasopharyngeal Swab  Result Value Ref Range   SARS Coronavirus 2 NEGATIVE NEGATIVE    Comment: (NOTE) If result is NEGATIVE SARS-CoV-2 target nucleic acids are NOT DETECTED. The SARS-CoV-2 RNA is generally detectable in upper and lower  respiratory specimens during the acute phase of infection. The lowest  concentration of SARS-CoV-2 viral copies this assay can detect is 250  copies / mL. A negative result does not preclude SARS-CoV-2 infection  and should not be used as the sole basis for treatment or other  patient management decisions.  A negative result may occur with  improper specimen collection /  handling, submission of specimen other  than nasopharyngeal swab, presence of viral mutation(s) within the  areas targeted by this assay, and inadequate number of viral copies  (<250 copies / mL). A negative result must be combined with clinical  observations, patient history, and epidemiological information. If result is POSITIVE SARS-CoV-2 target nucleic acids are DETECTED. The SARS-CoV-2 RNA is generally detectable in upper and lower  respiratory specimens dur ing the acute phase of infection.  Positive  results are indicative of active  infection with SARS-CoV-2.  Clinical  correlation with patient history and other diagnostic information is  necessary to determine patient infection status.  Positive results do  not rule out bacterial infection or co-infection with other viruses. If result is PRESUMPTIVE POSTIVE SARS-CoV-2 nucleic acids MAY BE PRESENT.   A presumptive positive result was obtained on the submitted specimen  and confirmed on repeat testing.  While 2019 novel coronavirus  (SARS-CoV-2) nucleic acids may be present in the submitted sample  additional confirmatory testing may be necessary for epidemiological  and / or clinical management purposes  to differentiate between  SARS-CoV-2 and other Sarbecovirus currently known to infect humans.  If clinically indicated additional testing with an alternate test  methodology [id number]) is advised. The SARS-CoV-2 RNA is generally  detectable in upper and lower respiratory sp ecimens during the acute  phase of infection. The expected result is Negative. Fact Sheet for Patients:  BoilerBrush.com.cyhttps://www.fda.gov/media/136312/download Fact Sheet for Healthcare Providers: https://pope.com/https://www.fda.gov/media/136313/download This test is not yet approved or cleared by the [place] FDA and has been authorized for detection and/or diagnosis of SARS-CoV-2 by FDA under an Emergency Use Authorization (EUA).  This EUA will remain in effect (meaning this  test can be used) for the duration of the COVID-19 declaration under Section 564(b)(1) of the Act, 21 U.S.C. section 360bbb-3(b)(1), unless the authorization is terminated or revoked sooner. Performed at [facility], [address] [place], [place] [address]     Chemistries  Recent Labs  Lab 12/31/18 1755  NA 141  K 3.3*  CL 106  CO2 27  GLUCOSE 117*  BUN 17  CREATININE 0.80  CALCIUM 9.1   ------------------------------------------------------------------------------------------------------------------  ------------------------------------------------------------------------------------------------------------------ GFR: CrCl cannot be calculated (Unknown ideal weight.). Liver Function Tests: No results for input(s): AST, ALT, ALKPHOS, BILITOT, PROT, ALBUMIN in the last 168 hours. No results for input(s): LIPASE, AMYLASE in the last 168 hours. No results for input(s): AMMONIA in the last 168 hours. Coagulation Profile: No results for input(s): INR, PROTIME in the last 168 hours. Cardiac Enzymes: No results for input(s): CKTOTAL, CKMB, CKMBINDEX, TROPONINI in the last 168 hours. BNP (last 3 results) No results for input(s): PROBNP in the last 8760 hours. HbA1C: No results for input(s): HGBA1C in the last 72 hours. CBG: No results for input(s): GLUCAP in the last 168 hours. Lipid Profile: No results for input(s): CHOL, HDL, LDLCALC, TRIG, CHOLHDL, LDLDIRECT in the last 72 hours. Thyroid Function Tests: No results for input(s): TSH, T4TOTAL, FREET4, T3FREE, THYROIDAB in the last 72 hours. Anemia Panel: No results for input(s): VITAMINB12, FOLATE, FERRITIN, TIBC, IRON, RETICCTPCT in the last 72 hours.  --------------------------------------------------------------------------------------------------------------- Urine analysis:    Component Value Date/Time   COLORURINE YELLOW 12/31/2018 1844   APPEARANCEUR CLEAR 12/31/2018 1844   LABSPEC 1.008 12/31/2018 1844    PHURINE 6.0 12/31/2018 1844   GLUCOSEU NEGATIVE 12/31/2018 1844   HGBUR NEGATIVE 12/31/2018 1844   BILIRUBINUR NEGATIVE 12/31/2018 1844   KETONESUR NEGATIVE 12/31/2018 1844   PROTEINUR NEGATIVE 12/31/2018 1844   UROBILINOGEN 0.2 09/20/2014 1653   NITRITE NEGATIVE 12/31/2018 1844   LEUKOCYTESUR NEGATIVE 12/31/2018 1844      Imaging Results:    Dg Chest Port 1 View  Result Date: 12/31/2018 CLINICAL DATA:  Shortness of breath and weight gain EXAM: PORTABLE CHEST 1 VIEW COMPARISON:  December 29, 2018 FINDINGS: The patient is slightly rotated. The cardiomediastinal silhouette is unchanged. Overlying median sternotomy wires are present. The lungs are clear. No focal airspace consolidation or pleural effusion. No acute  osseous finding. IMPRESSION: No acute cardiopulmonary process. Electronically Signed   By: Prudencio Pair M.D.   On: 12/31/2018 18:12   nsr at 80, nl axis, nl int, no st-t changes c/w ischemia   Assessment & Plan:    Principal Problem:   Dyspnea Active Problems:   Essential hypertension   Hypothyroidism   Hyperlipidemia   Hypokalemia   Edema  Dyspnea likely secondary to worsening Copd and noncompliance with inhalers Check cardiac echo r/o cardiomyopathy or pulmonary hypertension PT to evaluate for home o2 with exertion since had 86% pox on RA with exertion in ED.  Start Anoro 1puff qday Start Albuterol neb q6h and q6h prn    Hypokalemia likely due to recent increase in lasix from 40-> 80mg  po qday Replete Check magnesium in am Check cmp in am  Hypothyroidism Check TSH Cont Levothyroxine  CAD s/p CABG Cont Aspirin 81mg  po qday Cont Lipitor 80mg  po qhs Cont Lasix 40mg  po qday Cont Imdur 30mg  po qday Cont Lisinopril 20mg  po qday Cont Amlodipine 10mg  po qday  Copd not currently on home o2 DC Advair Anoro as above Albuterol as above  Edema Consider DC amlodipine as this can cause Edema Consider DC NSAIDS as this can cause Edema Cont Lasix as above   Anxiety Cont Xanax 0.25mg  po bid prn   Gerd Cont Pepcid   DVT Prophylaxis-   Lovenox - SCDs   AM Labs Ordered, also please review Full Orders  Family Communication: Admission, patients condition and plan of care including tests being ordered have been discussed with the patient and husband  who indicate understanding and agree with the plan and Code Status.  Code Status:  FULL CODE, pt's family present with patient in ER  Admission status: Observation: Based on patients clinical presentation and evaluation of above clinical data, I have made determination that patient meets observation criteria at this time.  Time spent in minutes : 70 minutes   Jani Gravel M.D on 12/31/2018 at 9:13 PM

## 2018-12-31 NOTE — ED Triage Notes (Signed)
Patient reports increasing weakness and SOB x 1 week. Patient states she is unable to walk to the bathroom without getting out of breath. Seen by PCP on Monday and dose of Lasix doubled. Patient reports bilat LE edema.

## 2018-12-31 NOTE — ED Notes (Signed)
Patient ambulated to restroom.  Patient was very short of breath and she stated she was "very weak".  Patient's o2 sat dropped to 84%.  Patient placed on 2 lpm o2 via nasal cannula and o2 sat increased to 98%.

## 2018-12-31 NOTE — ED Provider Notes (Signed)
[facility]ERGENCY DEPARTMENT Provider Note   CSN: [id number] Arrival date & time: 12/31/18  1707     History   Chief Complaint Chief Complaint  Patient presents with   Weakness    HPI Debbie [name] is a 78 y.o. female.     HPI  Pt was seen at 1735. Per pt, c/o gradual onset and worsening of persistent "SOB and pedal edema" for the past 1 week. Pt states her baseline weight is 124 lbs. States she had gained "4 lbs in one day" over the weekend, so she saw her PMD on Monday. Pt states her PMD increased her lasix from 40mg  daily to 80mg  daily and told her to wear compression hose. Pt states she has "only lost one pound" since increasing her lasix 2 days ago. Has been associated with generalized weakness/fatigue. Pt states she has been walking shorter and shorter distances due to her symptoms. Denies CP/palpitations, no cough, no abd pain, no back pain, no N/V/D, no fevers, no injury, no focal motor weakness, no tingling/numbness in extremities.     Past Medical History:  Diagnosis Date   Allergic rhinitis    Anxiety    Arthritis    Asthma    Bladder spasms    CAD (coronary artery disease)    [place]AB[place] 2006 [place]   Carotid artery disease (HCC)    Collagen vascular disease (HCC)    COPD (chronic obstructive pulmonary disease) (HCC)    Cystocele    Essential hypertension    GERD (gastroesophageal reflux disease)    Hyperlipidemia    Hypothyroidism    Hypothyroidism    Osteoporosis    Renal insufficiency    Vaginal vault prolapse     Patient Active Problem List   Diagnosis Date Noted   Generalized weakness 03/31/2017   Headache 11/04/2016   Bereavement due to life event 11/04/2016   Dyspnea 01/24/2015   Hypokalemia 09/20/2014   Chest pain 09/20/2014   Carotid artery disease (HCC)    CAD (coronary artery disease)    Essential hypertension    Hypothyroidism    GERD (gastroesophageal reflux disease)    Allergic rhinitis     Anxiety    Hyperlipidemia    Hx of CABG    Ejection fraction    Tricuspid regurgitation     Past Surgical History:  Procedure Laterality Date   ANTERIOR AND POSTERIOR REPAIR N/A 04/06/2013   Procedure: CYSTOCELE REPAIR ;  Surgeon: Martina SinnerScott A MacDiarmid, MD;  Location: [facility];  Service: Urology;  Laterality: N/A;   CATARACTS     REMOVED   COLONOSCOPY     CORNEA LACERATION REPAIR     CORONARY ARTERY BYPASS GRAFT     X 5 VESSELS   CYSTOSCOPY N/A 04/06/2013   Procedure: CYSTOSCOPY;  Surgeon: Martina SinnerScott A MacDiarmid, MD;  Location: [facility];  Service: Urology;  Laterality: N/A;   HERNIA REPAIR     SEPTOPLASTY N/A 04/28/2015   Procedure: SEPTOPLASTY;  Surgeon: Melvenia BeamMitchell Gore, MD;  [facility];  Service: ENT;  Laterality: N/A;   SINUS ENDO W/FUSION Bilateral 04/28/2015   Procedure: ENDOSCOPIC SINUS SURGERY WITH NAVIGATION;  Surgeon: Melvenia BeamMitchell Gore, MD;  [facility];  Service: ENT;  Laterality: Bilateral;   TUBAL LIGATION       OB History    Gravida  4   Para  4   Term  3   Preterm  1   AB      Living  SAB      TAB      Ectopic      Multiple      Live Births               Home Medications    Prior to Admission medications   Medication Sig Start Date End Date Taking? Authorizing Provider  ALPRAZolam (XANAX) 0.25 MG tablet Take 1 tablet (0.25 mg total) by mouth 2 (two) times daily as needed for anxiety. 11/06/16   Joseph Art, DO  amLODipine (NORVASC) 10 MG tablet Take 1 tablet (10 mg total) daily by mouth. 04/01/17   Johnson, Clanford L, MD  aspirin 81 MG tablet Take 81 mg by mouth daily.    [provider]  atorvastatin (LIPITOR) 80 MG tablet Take 80 mg by mouth at bedtime.    [provider]  baclofen (LIORESAL) 10 MG tablet Take 10 mg by mouth daily. 10/31/16   [provider]  Biotin 5000 MCG CAPS Take 5,000 mcg by mouth daily.     [provider]  cetirizine (ZYRTEC) 10 MG tablet Take 10 mg by mouth  daily as needed.     [provider]  cholecalciferol (VITAMIN D) 1000 units tablet Take 1,000 Units by mouth daily.    [provider]  fluticasone (FLONASE) 50 MCG/ACT nasal spray Place 2 sprays into the nose daily.    [provider]  Fluticasone-Salmeterol (ADVAIR) 250-50 MCG/DOSE AEPB Inhale 1 puff into the lungs every 12 (twelve) hours.    [provider]  furosemide (LASIX) 40 MG tablet Take 1 tablet (40 mg total) daily by mouth. Patient taking differently: Take 80 mg by mouth daily.  04/02/17   Johnson, Clanford L, MD  guaiFENesin (MUCINEX) 600 MG 12 hr tablet Take 1,200 mg by mouth 2 (two) times daily as needed for congestion.    [provider]  Hypromellose (ARTIFICIAL TEARS OP) Place 1 drop into both eyes daily as needed (dry eyes).    [provider]  ibuprofen (ADVIL,MOTRIN) 200 MG tablet Take 1 tablet (200 mg total) by mouth every 8 (eight) hours as needed. Patient taking differently: Take 200 mg by mouth every 8 (eight) hours as needed for moderate pain.  12/18/15   Vickki Hearing, MD  isosorbide mononitrate (IMDUR) 30 MG 24 hr tablet Take 30 mg by mouth daily. 09/24/16   [provider]  levothyroxine (SYNTHROID, LEVOTHROID) 112 MCG tablet Take 112 mcg by mouth daily. 09/24/16   [provider]  lisinopril (PRINIVIL,ZESTRIL) 20 MG tablet Take 1 tablet (20 mg total) by mouth daily. 11/06/16   Joseph Art, DO  montelukast (SINGULAIR) 10 MG tablet Take 10 mg by mouth at bedtime.    [provider]  NEXIUM 40 MG capsule Take 40 mg by mouth daily. 09/24/16   [provider]  nitroGLYCERIN (NITROSTAT) 0.4 MG SL tablet Place 1 tablet (0.4 mg total) under the tongue every 5 (five) minutes x 3 doses as needed for chest pain (if no relief after 3rd dose, call 911 or proceed to the ED for an evaluation). 01/30/18   Jonelle Sidle, MD  polyvinyl alcohol (LIQUIFILM TEARS) 1.4 % ophthalmic solution Place  2 drops into both eyes as needed for dry eyes (3-4 times weekly).    [provider]  potassium chloride (MICRO-K) 10 MEQ CR capsule Take 20 mEq by mouth 2 (two) times daily.     [provider]    Family History  Family History  Problem Relation Age of Onset   Cirrhosis Father    Lung disease Father    Diabetes Mellitus II Mother    Heart Problems Mother     Social History Social History   Tobacco Use   Smoking status: Never Smoker   Smokeless tobacco: Never Used  Substance Use Topics   Alcohol use: Yes    Alcohol/week: 0.0 standard drinks    Comment: 1 drink 1 time a year   Drug use: No     Allergies   Ivp dye [iodinated diagnostic agents], Betadine [povidone iodine], Codeine, Other, Penicillins, and Sulfa antibiotics   Review of Systems Review of Systems ROS: Statement: All systems negative except as marked or noted in the HPI; Constitutional: Negative for fever and chills. ; ; Eyes: Negative for eye pain, redness and discharge. ; ; ENMT: Negative for ear pain, hoarseness, nasal congestion, sinus pressure and sore throat. ; ; Cardiovascular: Negative for chest pain, palpitations, diaphoresis, +dyspnea and peripheral edema. ; ; Respiratory: Negative for cough, wheezing and stridor. ; ; Gastrointestinal: Negative for nausea, vomiting, diarrhea, abdominal pain, blood in stool, hematemesis, jaundice and rectal bleeding. . ; ; Genitourinary: Negative for dysuria, flank pain and hematuria. ; ; Musculoskeletal: Negative for back pain and neck pain. Negative for swelling and trauma.; ; Skin: Negative for pruritus, rash, abrasions, blisters, bruising and skin lesion.; ; Neuro: +generalized weakness/fatigue. Negative for headache, lightheadedness and neck stiffness. Negative for altered level of consciousness, altered mental status, extremity weakness, paresthesias, involuntary movement, seizure and syncope.       Physical Exam Updated Vital Signs BP (!)  155/73 (BP Location: Right Arm)    Pulse 83    Temp 98.9 F (37.2 C) (Oral)    Resp (!) 24    SpO2 98%    19:40:34 Orthostatic Vital Signs BN  Orthostatic Lying   BP- Lying: 146/71  Pulse- Lying: 78      Orthostatic Sitting  BP- Sitting: 151/87  Pulse- Sitting: 81      Orthostatic Standing at 0 minutes  BP- Standing at 0 minutes: 152/92Abnormal   Pulse- Standing at 0 minutes: 89     Physical Exam 1740: Physical examination:  Nursing notes reviewed; Vital signs and O2 SAT reviewed;  Constitutional: Well developed, Well nourished, Well hydrated, In no acute distress; Head:  Normocephalic, atraumatic; Eyes: EOMI, PERRL, No scleral icterus; ENMT: Mouth and pharynx normal, Mucous membranes moist; Neck: Supple, Full range of motion, No lymphadenopathy; Cardiovascular: Regular rate and rhythm, No gallop; Respiratory: Breath sounds coarse & equal bilaterally, No wheezes.  Speaking full sentences with ease, Normal respiratory effort/excursion; Chest: Nontender, Movement normal; Abdomen: Soft, Nontender, Nondistended, Normal bowel sounds; Genitourinary: No CVA tenderness; Extremities: Peripheral pulses normal, No tenderness, Pt wearing compression hose, +1 pedal edema bilat. No calf edema or asymmetry.; Neuro: AA&Ox3, Major CN grossly intact.  Speech clear. No gross focal motor or sensory deficits in extremities.; Skin: Color normal, Warm, Dry.   ED Treatments / Results  Labs (all labs ordered are listed, but only abnormal results are displayed)   EKG EKG Interpretation  Date/Time:  Thursday December 31 2018 17:45:32 EDT Ventricular Rate:  81 PR Interval:    QRS Duration: 89 QT Interval:  382 QTC Calculation: 444 R Axis:   59 Text Interpretation:  Sinus rhythm Abnormal R-wave progression, early transition Borderline T abnormalities, inferior leads Baseline wander Artifact When compared with ECG of 04/01/2017 Artifact is now present Confirmed by Samuel JesterMcManus, Macy Lingenfelter [phone]) on 12/31/2018  5:50:46 PM   Radiology   Procedures Procedures (including critical care time)  Medications Ordered in ED Medications  sodium chloride flush (NS) 0.9 % injection 3 mL (has no administration in time range)     Initial Impression / Assessment and Plan / ED Course  I have reviewed the triage vital signs and the nursing notes.  Pertinent labs & imaging results that were available during my care of the patient were reviewed by me and considered in my medical decision making (see chart for details).     MDM Reviewed: previous chart, nursing note and vitals Reviewed previous: labs and ECG Interpretation: labs, ECG and x-ray Total time providing critical care: 30-74 minutes. This excludes time spent performing separately reportable procedures and services. Consults: admitting MD    CRITICAL CARE Performed by: Samuel JesterKathleen Donaldson Richter Total critical care time: 35 minutes Critical care time was exclusive of separately billable procedures and treating other patients. Critical care was necessary to treat or prevent imminent or life-threatening deterioration. Critical care was time spent personally by me on the following activities: development of treatment plan with patient and/or surrogate as well as nursing, discussions with consultants, evaluation of patient's response to treatment, examination of patient, obtaining history from patient or surrogate, ordering and performing treatments and interventions, ordering and review of laboratory studies, ordering and review of radiographic studies, pulse oximetry and re-evaluation of patient's condition.   Results for orders placed or performed during the hospital encounter of 12/31/18  Basic metabolic panel  Result Value Ref Range   Sodium 141 135 - 145 mmol/L   Potassium 3.3 (L) 3.5 - 5.1 mmol/L   Chloride 106 98 - 111 mmol/L   CO2 27 22 - 32 mmol/L   Glucose, Bld 117 (H) 70 - 99 mg/dL   BUN 17 8 - 23 mg/dL   Creatinine, Ser 8.110.80 0.44 - 1.00  mg/dL   Calcium 9.1 8.9 - 91.410.3 mg/dL   GFR calc non Af Amer >60 >60 mL/min   GFR calc Af Amer >60 >60 mL/min   Anion gap 8 5 - 15  Urinalysis, Routine w reflex microscopic  Result Value Ref Range   Color, Urine YELLOW YELLOW   APPearance CLEAR CLEAR   Specific Gravity, Urine 1.008 1.005 - 1.030   pH 6.0 5.0 - 8.0   Glucose, UA NEGATIVE NEGATIVE mg/dL   Hgb urine dipstick NEGATIVE NEGATIVE   Bilirubin Urine NEGATIVE NEGATIVE   Ketones, ur NEGATIVE NEGATIVE mg/dL   Protein, ur NEGATIVE NEGATIVE mg/dL   Nitrite NEGATIVE NEGATIVE   Leukocytes,Ua NEGATIVE NEGATIVE  CBC with Differential  Result Value Ref Range   WBC 9.8 4.0 - 10.5 K/uL   RBC 4.51 3.87 - 5.11 MIL/uL   Hemoglobin 12.4 12.0 - 15.0 g/dL   HCT 78.239.6 95.636.0 - 21.346.0 %   MCV 87.8 80.0 - 100.0 fL   MCH 27.5 26.0 - 34.0 pg   MCHC 31.3 30.0 - 36.0 g/dL   RDW 08.613.7 57.811.5 - 46.915.5 %   Platelets 372 150 - 400 K/uL   nRBC 0.0 0.0 - 0.2 %   Neutrophils Relative % 59 %   Neutro Abs 5.7 1.7 - 7.7 K/uL   Lymphocytes Relative 25 %   Lymphs Abs 2.5 0.7 - 4.0 K/uL   Monocytes Relative 13 %   Monocytes Absolute 1.3 (H) 0.1 - 1.0 K/uL   Eosinophils Relative 2 %   Eosinophils Absolute 0.2 0.0 - 0.5 K/uL   Basophils Relative 1 %   Basophils  Absolute 0.1 0.0 - 0.1 K/uL   Immature Granulocytes 0 %   Abs Immature Granulocytes 0.03 0.00 - 0.07 K/uL  Brain natriuretic peptide  Result Value Ref Range   B Natriuretic Peptide 46.0 0.0 - 100.0 pg/mL  Troponin I (High Sensitivity)  Result Value Ref Range   Troponin I (High Sensitivity) 4 <18 ng/L   Dg Chest Port 1 View Result Date: 12/31/2018 CLINICAL DATA:  Shortness of breath and weight gain EXAM: PORTABLE CHEST 1 VIEW COMPARISON:  December 29, 2018 FINDINGS: The patient is slightly rotated. The cardiomediastinal silhouette is unchanged. Overlying median sternotomy wires are present. The lungs are clear. No focal airspace consolidation or pleural effusion. No acute osseous finding. IMPRESSION: No  acute cardiopulmonary process. Electronically Signed   By: Prudencio Pair M.D.   On: 12/31/2018 18:12    Debbie [name] was evaluated in Emergency Department on 12/31/2018 for the symptoms described in the history of present illness. She was evaluated in the context of the global COVID-19 pandemic, which necessitated consideration that the patient might be at risk for infection with the SARS-CoV-2 virus that causes COVID-19. Institutional protocols and algorithms that pertain to the evaluation of patients at risk for COVID-19 are in a state of rapid change based on information released by regulatory bodies including the CDC and federal and state organizations. These policies and algorithms were followed during the patient's care in the ED.   1800:  Pt very confused regarding her weights. I reviewed telephone call to Cards MD yesterday that is in [place] (pt stated she lost weight and felt better): pt states she "can't remember the numbers from the scale" and "has gained weight," and "doesn't think that's right because I didn't feel well yesterday."  Family at bedside also cannot recall home weights. Endorse pt has been "very weak and SOB" for the past week. Weight today is 129.8 lbs  1845:  Pt ambulated to bathroom: O2 Sats dropped to 84% R/A, and pt c/o SOB and generalized weakness. Lungs diminished/coarse, no wheezing. Denies CP.   2010:  Weight is up from her stated baseline, but CXR without fluid overload and BNP normal. Question COPD component. Will dose hour long neb/solumedrol after COVID testing resulted. Will need admit for hypoxia with ambulation.  T/C returned from [facility]. Maudie Mercury, case discussed, including:  HPI, pertinent PM/SHx, VS/PE, dx testing, ED course and treatment:  Agreeable to admit.     Final Clinical Impressions(s) / ED Diagnoses   Final diagnoses:  None    ED Discharge Orders    None       Francine Graven, DO 01/06/19 1248

## 2019-01-01 ENCOUNTER — Observation Stay (HOSPITAL_BASED_OUTPATIENT_CLINIC_OR_DEPARTMENT_OTHER): Payer: Medicare Other

## 2019-01-01 DIAGNOSIS — I361 Nonrheumatic tricuspid (valve) insufficiency: Secondary | ICD-10-CM | POA: Diagnosis not present

## 2019-01-01 DIAGNOSIS — I10 Essential (primary) hypertension: Secondary | ICD-10-CM

## 2019-01-01 DIAGNOSIS — R0600 Dyspnea, unspecified: Secondary | ICD-10-CM | POA: Diagnosis not present

## 2019-01-01 LAB — CBC
HCT: 38.7 % (ref 36.0–46.0)
Hemoglobin: 12.3 g/dL (ref 12.0–15.0)
MCH: 27.4 pg (ref 26.0–34.0)
MCHC: 31.8 g/dL (ref 30.0–36.0)
MCV: 86.2 fL (ref 80.0–100.0)
Platelets: 341 10*3/uL (ref 150–400)
RBC: 4.49 MIL/uL (ref 3.87–5.11)
RDW: 13.5 % (ref 11.5–15.5)
WBC: 6.6 10*3/uL (ref 4.0–10.5)
nRBC: 0 % (ref 0.0–0.2)

## 2019-01-01 LAB — COMPREHENSIVE METABOLIC PANEL
ALT: 13 U/L (ref 0–44)
AST: 13 U/L — ABNORMAL LOW (ref 15–41)
Albumin: 3.3 g/dL — ABNORMAL LOW (ref 3.5–5.0)
Alkaline Phosphatase: 61 U/L (ref 38–126)
Anion gap: 8 (ref 5–15)
BUN: 15 mg/dL (ref 8–23)
CO2: 26 mmol/L (ref 22–32)
Calcium: 9.4 mg/dL (ref 8.9–10.3)
Chloride: 105 mmol/L (ref 98–111)
Creatinine, Ser: 0.72 mg/dL (ref 0.44–1.00)
GFR calc Af Amer: >60 mL/min (ref >60)
GFR calc non Af Amer: >60 mL/min (ref >60)
Glucose, Bld: 215 mg/dL — ABNORMAL HIGH (ref 70–99)
Potassium: 3.9 mmol/L (ref 3.5–5.1)
Sodium: 139 mmol/L (ref 135–145)
Total Bilirubin: 0.7 mg/dL (ref 0.3–1.2)
Total Protein: 5.8 g/dL — ABNORMAL LOW (ref 6.5–8.1)

## 2019-01-01 LAB — ECHOCARDIOGRAM COMPLETE
Height: 63 in
Weight: 2070.56 oz

## 2019-01-01 MED ORDER — METHYLPREDNISOLONE SODIUM SUCC 40 MG IJ SOLR
40.0000 mg | Freq: Three times a day (TID) | INTRAMUSCULAR | Status: DC
  Administered 2019-01-01 – 2019-01-03 (×6): 40 mg via INTRAVENOUS
  Filled 2019-01-01 (×6): qty 1

## 2019-01-01 MED ORDER — GUAIFENESIN ER 600 MG PO TB12
600.0000 mg | ORAL_TABLET | Freq: Two times a day (BID) | ORAL | Status: DC
  Administered 2019-01-01 – 2019-01-03 (×4): 600 mg via ORAL
  Filled 2019-01-01 (×5): qty 1

## 2019-01-01 NOTE — Progress Notes (Signed)
*  PRELIMINARY RESULTS* Echocardiogram 2D Echocardiogram has been performed.  Samuel Germany 01/01/2019, 11:50 AM

## 2019-01-01 NOTE — Progress Notes (Signed)
Patient Demographics:    Debbie [name], is a 78 y.o. female, DOB - 1940/06/20, FAO:[id number]  Admit date - 12/31/2018   Admitting Physician Pearson GrippeJames Kim, MD  Outpatient Primary MD for the patient is Kirstie PeriShah, Ashish, MD  LOS - 0   Chief Complaint  Patient presents with  . Weakness        Subjective:    Debbie BalesElizabeth [name] today has no fevers, no emesis,  No chest pain,  shob and cough persist  Assessment  & Plan :    Principal Problem:   Dyspnea Active Problems:   Essential hypertension   Hypothyroidism   Hyperlipidemia   Hypokalemia   Edema  Brief Summary:- 78 y.o. female,  w hypertension, Hyperlipidemia, glucose intolerance (hga1c=6.1 11/06/2016), CAD, s/p CABG, carotid stenosis, gerd,  hypothyroidism, Copd not on home o2- admitted on 12/31/18 with COPD exacerbation in the setting of noncompliance with bronchodilators  A/p  1)Acute COPD Exacerbation- no definite pneumonia,  treat empirically with IV Solu-Medrol 40 mg every 8 hours, give mucolytics, antibiotics and bronchodilators as ordered, supplemental oxygen as ordered.   2)Acute Hypoxic Respiratory Failure--- secondary to COPD exacerbation as above #1, treat as above #1  3)Dyspnea plus LE edema--echo with EF of 60 to 65%, lower extremity edema noted, continue Lasix  4)H/o CAD - s/p prior CABG-stable, no ACS type symptoms, continue Lipitor 80 mg daily, continue aspirin 81 mg daily, continue isosorbide 30 mg daily  5)Hypothyroidism-continue Atorvastatin 88 mcg daily  6)HTN--stable, continue amlodipine 10 mg daily, lisinopril 20 mg daily  Disposition/Need for in-Hospital Stay- patient unable to be discharged at this time due to new onset hypoxia persistent respiratory symptoms  Code Status : Full   Family Communication:   NA (patient is alert, awake and coherent)   Disposition Plan  : home   Consults  :  na  DVT Prophylaxis  :   Lovenox  - SCDs   Lab Results  Component Value Date   PLT 341 01/01/2019    Inpatient Medications  Scheduled Meds: . albuterol  2.5 mg Nebulization Q6H  . amLODipine  10 mg Oral Daily  . aspirin  81 mg Oral Daily  . atorvastatin  80 mg Oral QHS  . baclofen  10 mg Oral Daily  . cholecalciferol  1,000 Units Oral Daily  . enoxaparin (LOVENOX) injection  40 mg Subcutaneous Q24H  . famotidine  20 mg Oral Daily  . fluticasone  2 spray Each Nare Daily  . furosemide  40 mg Oral Daily  . ipratropium  1 mg Nebulization Once  . isosorbide mononitrate  30 mg Oral Daily  . levothyroxine  88 mcg Oral Q0600  . lisinopril  20 mg Oral Daily  . loratadine  10 mg Oral Daily  . montelukast  10 mg Oral QHS  . potassium chloride SA  20 mEq Oral BID  . sodium chloride flush  3 mL Intravenous Once  . sodium chloride flush  3 mL Intravenous Q12H  . umeclidinium-vilanterol  1 puff Inhalation Daily   Continuous Infusions: . sodium chloride     PRN Meds:.sodium chloride, acetaminophen **OR** acetaminophen, albuterol, ALPRAZolam, polyvinyl alcohol, sodium chloride flush   Anti-infectives (From admission, onward)   None       Objective:  Vitals:   01/01/19 0306 01/01/19 0454 01/01/19 0719 01/01/19 0900  BP:  (!) 115/40    Pulse:  77    Resp:  18    Temp:  97.8 F (36.6 C)    TempSrc:  Oral    SpO2:  97% 98% 99%  Weight: 58.7 kg     Height:        Wt Readings from Last 3 Encounters:  01/01/19 58.7 kg  01/30/18 62.1 kg  07/22/17 60.9 kg    Intake/Output Summary (Last 24 hours) at 01/01/2019 1623 Last data filed at 01/01/2019 0814 Gross per 24 hour  Intake 483 ml  Output -  Net 483 ml    Physical Exam  Gen:- Awake Alert, able to speak in short sentences HEENT:- [place].AT, No sclera icterus Nose- [place] 2L/min Neck-Supple Neck,No JVD,.  Lungs-diminished in bases, few scattered wheezes  CV- S1, S2 normal, regular, prior sternotomy scar Abd-  +ve B.Sounds, Abd Soft, No tenderness,     Extremity/Skin:- trace  edema, pedal pulses present  Psych-affect is appropriate, oriented x3 Neuro-no new focal deficits, no tremors   Data Review:   Micro Results Recent Results (from the past 240 hour(s))  SARS Coronavirus 2 [facility] order, Performed in [facility] hospital lab) Nasopharyngeal Nasopharyngeal Swab     Status: None   Collection Time: 12/31/18  6:53 PM   Specimen: Nasopharyngeal Swab  Result Value Ref Range Status   SARS Coronavirus 2 NEGATIVE NEGATIVE Final    Comment: (NOTE) If result is NEGATIVE SARS-CoV-2 target nucleic acids are NOT DETECTED. The SARS-CoV-2 RNA is generally detectable in upper and lower  respiratory specimens during the acute phase of infection. The lowest  concentration of SARS-CoV-2 viral copies this assay can detect is 250  copies / mL. A negative result does not preclude SARS-CoV-2 infection  and should not be used as the sole basis for treatment or other  patient management decisions.  A negative result may occur with  improper specimen collection / handling, submission of specimen other  than nasopharyngeal swab, presence of viral mutation(s) within the  areas targeted by this assay, and inadequate number of viral copies  (<250 copies / mL). A negative result must be combined with clinical  observations, patient history, and epidemiological information. If result is POSITIVE SARS-CoV-2 target nucleic acids are DETECTED. The SARS-CoV-2 RNA is generally detectable in upper and lower  respiratory specimens dur ing the acute phase of infection.  Positive  results are indicative of active infection with SARS-CoV-2.  Clinical  correlation with patient history and other diagnostic information is  necessary to determine patient infection status.  Positive results do  not rule out bacterial infection or co-infection with other viruses. If result is PRESUMPTIVE POSTIVE SARS-CoV-2 nucleic acids MAY BE PRESENT.   A presumptive positive result  was obtained on the submitted specimen  and confirmed on repeat testing.  While 2019 novel coronavirus  (SARS-CoV-2) nucleic acids may be present in the submitted sample  additional confirmatory testing may be necessary for epidemiological  and / or clinical management purposes  to differentiate between  SARS-CoV-2 and other Sarbecovirus currently known to infect humans.  If clinically indicated additional testing with an alternate test  methodology [phone]) is advised. The SARS-CoV-2 RNA is generally  detectable in upper and lower respiratory sp ecimens during the acute  phase of infection. The expected result is Negative. Fact Sheet for Patients:  StrictlyIdeas.no Fact Sheet for Healthcare Providers: BankingDealers.co.za This test is not yet  approved or cleared by the [place] FDA and has been authorized for detection and/or diagnosis of SARS-CoV-2 by FDA under an Emergency Use Authorization (EUA).  This EUA will remain in effect (meaning this test can be used) for the duration of the COVID-19 declaration under Section 564(b)(1) of the Act, 21 U.S.C. section 360bbb-3(b)(1), unless the authorization is terminated or revoked sooner. Performed at [facility], [address] [place], [place] [address]     Radiology Reports Dg Chest [place] 1 View  Result Date: 12/31/2018 CLINICAL DATA:  Shortness of breath and weight gain EXAM: PORTABLE CHEST 1 VIEW COMPARISON:  December 29, 2018 FINDINGS: The patient is slightly rotated. The cardiomediastinal silhouette is unchanged. Overlying median sternotomy wires are present. The lungs are clear. No focal airspace consolidation or pleural effusion. No acute osseous finding. IMPRESSION: No acute cardiopulmonary process. Electronically Signed   By: Jonna ClarkBindu  Avutu M.D.   On: 12/31/2018 18:12    CBC Recent Labs  Lab 12/31/18 1755 01/01/19 0619  WBC 9.8 6.6  HGB 12.4 12.3  HCT 39.6 38.7  PLT 372 341   MCV 87.8 86.2  MCH 27.5 27.4  MCHC 31.3 31.8  RDW 13.7 13.5  LYMPHSABS 2.5  --   MONOABS 1.3*  --   EOSABS 0.2  --   BASOSABS 0.1  --     Chemistries  Recent Labs  Lab 12/31/18 1755 01/01/19 0619  NA 141 139  K 3.3* 3.9  CL 106 105  CO2 27 26  GLUCOSE 117* 215*  BUN 17 15  CREATININE 0.80 0.72  CALCIUM 9.1 9.4  AST  --  13*  ALT  --  13  ALKPHOS  --  61  BILITOT  --  0.7   ------------------------------------------------------------------------------------------------------------------ No results for input(s): CHOL, HDL, LDLCALC, TRIG, CHOLHDL, LDLDIRECT in the last 72 hours.  Lab Results  Component Value Date   HGBA1C 6.1 (H) 11/06/2016   ------------------------------------------------------------------------------------------------------------------ No results for input(s): TSH, T4TOTAL, T3FREE, THYROIDAB in the last 72 hours.  Invalid input(s): FREET3 ------------------------------------------------------------------------------------------------------------------ No results for input(s): VITAMINB12, FOLATE, FERRITIN, TIBC, IRON, RETICCTPCT in the last 72 hours.  Coagulation profile No results for input(s): INR, PROTIME in the last 168 hours.  No results for input(s): DDIMER in the last 72 hours.  Cardiac Enzymes No results for input(s): CKMB, TROPONINI, MYOGLOBIN in the last 168 hours.  Invalid input(s): CK ------------------------------------------------------------------------------------------------------------------    Component Value Date/Time   BNP 46.0 12/31/2018 1755     Bion Todorov M.D on 01/01/2019 at 4:23 PM  Go to [email or web address] - for contact info  [facility] - Office  [phone]

## 2019-01-01 NOTE — Care Management Obs Status (Signed)
[facility] NOTIFICATION   Patient Details  Name: Debbie [name] MRN: [id number] Date of Birth: 08/08/1940   Medicare Observation Status Notification Given:  Yes    Ihor Gully, LCSW 01/01/2019, 2:40 PM

## 2019-01-02 DIAGNOSIS — R0902 Hypoxemia: Secondary | ICD-10-CM | POA: Diagnosis present

## 2019-01-02 DIAGNOSIS — Z951 Presence of aortocoronary bypass graft: Secondary | ICD-10-CM | POA: Diagnosis not present

## 2019-01-02 DIAGNOSIS — R600 Localized edema: Secondary | ICD-10-CM | POA: Diagnosis present

## 2019-01-02 DIAGNOSIS — Z833 Family history of diabetes mellitus: Secondary | ICD-10-CM | POA: Diagnosis not present

## 2019-01-02 DIAGNOSIS — E785 Hyperlipidemia, unspecified: Secondary | ICD-10-CM | POA: Diagnosis present

## 2019-01-02 DIAGNOSIS — Z7982 Long term (current) use of aspirin: Secondary | ICD-10-CM | POA: Diagnosis not present

## 2019-01-02 DIAGNOSIS — E876 Hypokalemia: Secondary | ICD-10-CM | POA: Diagnosis present

## 2019-01-02 DIAGNOSIS — F419 Anxiety disorder, unspecified: Secondary | ICD-10-CM | POA: Diagnosis present

## 2019-01-02 DIAGNOSIS — R0600 Dyspnea, unspecified: Secondary | ICD-10-CM | POA: Diagnosis not present

## 2019-01-02 DIAGNOSIS — Z981 Arthrodesis status: Secondary | ICD-10-CM | POA: Diagnosis not present

## 2019-01-02 DIAGNOSIS — I10 Essential (primary) hypertension: Secondary | ICD-10-CM | POA: Diagnosis present

## 2019-01-02 DIAGNOSIS — Z20828 Contact with and (suspected) exposure to other viral communicable diseases: Secondary | ICD-10-CM | POA: Diagnosis present

## 2019-01-02 DIAGNOSIS — M810 Age-related osteoporosis without current pathological fracture: Secondary | ICD-10-CM | POA: Diagnosis present

## 2019-01-02 DIAGNOSIS — K219 Gastro-esophageal reflux disease without esophagitis: Secondary | ICD-10-CM | POA: Diagnosis present

## 2019-01-02 DIAGNOSIS — J9601 Acute respiratory failure with hypoxia: Secondary | ICD-10-CM | POA: Diagnosis present

## 2019-01-02 DIAGNOSIS — I2510 Atherosclerotic heart disease of native coronary artery without angina pectoris: Secondary | ICD-10-CM | POA: Diagnosis present

## 2019-01-02 DIAGNOSIS — Z7951 Long term (current) use of inhaled steroids: Secondary | ICD-10-CM | POA: Diagnosis not present

## 2019-01-02 DIAGNOSIS — J441 Chronic obstructive pulmonary disease with (acute) exacerbation: Secondary | ICD-10-CM | POA: Diagnosis present

## 2019-01-02 DIAGNOSIS — E039 Hypothyroidism, unspecified: Secondary | ICD-10-CM | POA: Diagnosis present

## 2019-01-02 DIAGNOSIS — J449 Chronic obstructive pulmonary disease, unspecified: Secondary | ICD-10-CM | POA: Diagnosis present

## 2019-01-02 MED ORDER — ALUM & MAG HYDROXIDE-SIMETH 200-200-20 MG/5ML PO SUSP
30.0000 mL | Freq: Once | ORAL | Status: AC
  Administered 2019-01-02: 30 mL via ORAL
  Filled 2019-01-02: qty 30

## 2019-01-02 MED ORDER — AMLODIPINE BESYLATE 5 MG PO TABS
2.5000 mg | ORAL_TABLET | Freq: Every day | ORAL | Status: DC
  Administered 2019-01-03: 2.5 mg via ORAL
  Filled 2019-01-02: qty 1

## 2019-01-02 MED ORDER — DOXYCYCLINE HYCLATE 100 MG PO TABS
100.0000 mg | ORAL_TABLET | Freq: Two times a day (BID) | ORAL | Status: DC
  Administered 2019-01-02 – 2019-01-03 (×2): 100 mg via ORAL
  Filled 2019-01-02 (×2): qty 1

## 2019-01-02 MED ORDER — AMLODIPINE BESYLATE 5 MG PO TABS: 5.0000 mg | ORAL_TABLET | Freq: Every day | ORAL | Status: DC

## 2019-01-02 NOTE — Progress Notes (Signed)
Patient Demographics:    Debbie [name], is a 78 y.o. female, DOB - [birth year], EZM:[id number]  Admit date - 12/31/2018   Admitting Physician Jani Gravel, MD  Outpatient Primary MD for the patient is Monico Blitz, MD  LOS - 0   Chief Complaint  Patient presents with  . Weakness        Subjective:    Rip Harbour today has no fevers, no emesis,  No chest pain, cough and congestion improving, shortness of breath at rest improving, dyspnea on exertion persist, at rest O2 sats 96% on room air, with ambulation O2 sats dropped down to 87% on room air -Soft BP noted with  blood pressure down to 88/53 mmhg  Assessment  & Plan :    Principal Problem:   Dyspnea Active Problems:   Essential hypertension   Hypothyroidism   Hyperlipidemia   Hypokalemia   Edema   COPD with acute exacerbation (HCC)  Brief Summary:- 78 y.o. female,  w hypertension, Hyperlipidemia, glucose intolerance (hga1c=6.1 11/06/2016), CAD, s/p CABG, carotid stenosis, gerd,  hypothyroidism, Copd not on home o2- admitted on 12/31/18 with COPD exacerbation in the setting of noncompliance with bronchodilators  A/p  1)Acute COPD Exacerbation- no definite pneumonia, -dyspnea on exertion and hypoxia with ambulation persist, continue  IV Solu-Medrol 40 mg every 8 hours,c/n mucolytics, doxycycline and bronchodilators as ordered, supplemental oxygen as ordered.   2)Acute Hypoxic Respiratory Failure--- secondary to COPD exacerbation as above #1, treat as above #1  3)Dyspnea plus LE edema--echo with EF of 60 to 65%, lower extremity edema noted, continue Lasix, use TEDs stockings  4)H/o CAD - s/p prior CABG-stable, no ACS type symptoms, continue Lipitor 80 mg daily, continue aspirin 81 mg daily, continue isosorbide 30 mg daily  5)Hypothyroidism-continue Atorvastatin 88 mcg daily  6)HTN--stable, decrease amlodipine to 2.5 mg  daily due to soft  BP, lisinopril 20 mg daily  Disposition/Need for in-Hospital Stay- patient unable to be discharged at this time due to new onset hypoxia persistent respiratory symptoms-  dyspnea on exertion persist, at rest O2 sats 96% on room air, with ambulation O2 sats dropped down to 87% on room air  Code Status : Full   Family Communication:   NA (patient is alert, awake and coherent)   Disposition Plan  : home   Consults  :  na  DVT Prophylaxis  :  Lovenox  - SCDs   Lab Results  Component Value Date   PLT 341 01/01/2019    Inpatient Medications  Scheduled Meds: . albuterol  2.5 mg Nebulization Q6H  . [START ON 01/03/2019] amLODipine  5 mg Oral Daily  . aspirin  81 mg Oral Daily  . atorvastatin  80 mg Oral QHS  . baclofen  10 mg Oral Daily  . cholecalciferol  1,000 Units Oral Daily  . enoxaparin (LOVENOX) injection  40 mg Subcutaneous Q24H  . famotidine  20 mg Oral Daily  . fluticasone  2 spray Each Nare Daily  . furosemide  40 mg Oral Daily  . guaiFENesin  600 mg Oral BID  . ipratropium  1 mg Nebulization Once  . isosorbide mononitrate  30 mg Oral Daily  . levothyroxine  88 mcg Oral Q0600  . lisinopril  20 mg Oral Daily  .  loratadine  10 mg Oral Daily  . methylPREDNISolone (SOLU-MEDROL) injection  40 mg Intravenous Q8H  . montelukast  10 mg Oral QHS  . potassium chloride SA  20 mEq Oral BID  . sodium chloride flush  3 mL Intravenous Once  . sodium chloride flush  3 mL Intravenous Q12H  . umeclidinium-vilanterol  1 puff Inhalation Daily   Continuous Infusions: . sodium chloride     PRN Meds:.sodium chloride, acetaminophen **OR** acetaminophen, albuterol, ALPRAZolam, polyvinyl alcohol, sodium chloride flush   Anti-infectives (From admission, onward)   None       Objective:   Vitals:   01/02/19 0945 01/02/19 1354 01/02/19 1458 01/02/19 1638  BP: (!) 109/52 (!) 88/53  (!) 118/59  Pulse:  73    Resp:  17    Temp:  97.8 F (36.6 C)    TempSrc:  Oral    SpO2:  96% 96%    Weight:      Height:        Wt Readings from Last 3 Encounters:  01/02/19 69.1 kg  01/30/18 62.1 kg  07/22/17 60.9 kg    Intake/Output Summary (Last 24 hours) at 01/02/2019 1828 Last data filed at 01/02/2019 1700 Gross per 24 hour  Intake 960 ml  Output -  Net 960 ml    Physical Exam  Gen:- Awake Alert, able to speak in short sentences, dyspnea on exertion noted HEENT:- [place].AT, No sclera icterus Nose- [place] 2L/min Neck-Supple Neck,No JVD,.  Lungs-diminished in bases, few scattered wheezes  CV- S1, S2 normal, regular, prior sternotomy scar Abd-  +ve B.Sounds, Abd Soft, No tenderness,    Extremity/Skin:- trace  edema, pedal pulses present  Psych-affect is appropriate, oriented x3 Neuro-no new focal deficits, no tremors   Data Review:   Micro Results Recent Results (from the past 240 hour(s))  SARS Coronavirus 2 [facility](Hospital order, Performed in [facility] hospital lab) Nasopharyngeal Nasopharyngeal Swab     Status: None   Collection Time: 12/31/18  6:53 PM   Specimen: Nasopharyngeal Swab  Result Value Ref Range Status   SARS Coronavirus 2 NEGATIVE NEGATIVE Final    Comment: (NOTE) If result is NEGATIVE SARS-CoV-2 target nucleic acids are NOT DETECTED. The SARS-CoV-2 RNA is generally detectable in upper and lower  respiratory specimens during the acute phase of infection. The lowest  concentration of SARS-CoV-2 viral copies this assay can detect is 250  copies / mL. A negative result does not preclude SARS-CoV-2 infection  and should not be used as the sole basis for treatment or other  patient management decisions.  A negative result may occur with  improper specimen collection / handling, submission of specimen other  than nasopharyngeal swab, presence of viral mutation(s) within the  areas targeted by this assay, and inadequate number of viral copies  (<250 copies / mL). A negative result must be combined with clinical  observations, patient history, and epidemiological  information. If result is POSITIVE SARS-CoV-2 target nucleic acids are DETECTED. The SARS-CoV-2 RNA is generally detectable in upper and lower  respiratory specimens dur ing the acute phase of infection.  Positive  results are indicative of active infection with SARS-CoV-2.  Clinical  correlation with patient history and other diagnostic information is  necessary to determine patient infection status.  Positive results do  not rule out bacterial infection or co-infection with other viruses. If result is PRESUMPTIVE POSTIVE SARS-CoV-2 nucleic acids MAY BE PRESENT.   A presumptive positive result was obtained on the submitted specimen  and confirmed on repeat testing.  While 2019 novel coronavirus  (SARS-CoV-2) nucleic acids may be present in the submitted sample  additional confirmatory testing may be necessary for epidemiological  and / or clinical management purposes  to differentiate between  SARS-CoV-2 and other Sarbecovirus currently known to infect humans.  If clinically indicated additional testing with an alternate test  methodology [phone]([id number]) is advised. The SARS-CoV-2 RNA is generally  detectable in upper and lower respiratory sp ecimens during the acute  phase of infection. The expected result is Negative. Fact Sheet for Patients:  BoilerBrush.com.cyhttps://www.fda.gov/media/136312/download Fact Sheet for Healthcare Providers: https://pope.com/https://www.fda.gov/media/136313/download This test is not yet approved or cleared by the [place] FDA and has been authorized for detection and/or diagnosis of SARS-CoV-2 by FDA under an Emergency Use Authorization (EUA).  This EUA will remain in effect (meaning this test can be used) for the duration of the COVID-19 declaration under Section 564(b)(1) of the Act, 21 U.S.C. section 360bbb-3(b)(1), unless the authorization is terminated or revoked sooner. Performed at [facility], [address] [place], [place] [address]     Radiology Reports Dg Chest [place]  1 View  Result Date: 12/31/2018 CLINICAL DATA:  Shortness of breath and weight gain EXAM: PORTABLE CHEST 1 VIEW COMPARISON:  December 29, 2018 FINDINGS: The patient is slightly rotated. The cardiomediastinal silhouette is unchanged. Overlying median sternotomy wires are present. The lungs are clear. No focal airspace consolidation or pleural effusion. No acute osseous finding. IMPRESSION: No acute cardiopulmonary process. Electronically Signed   By: Jonna ClarkBindu  Avutu M.D.   On: 12/31/2018 18:12    CBC Recent Labs  Lab 12/31/18 1755 01/01/19 0619  WBC 9.8 6.6  HGB 12.4 12.3  HCT 39.6 38.7  PLT 372 341  MCV 87.8 86.2  MCH 27.5 27.4  MCHC 31.3 31.8  RDW 13.7 13.5  LYMPHSABS 2.5  --   MONOABS 1.3*  --   EOSABS 0.2  --   BASOSABS 0.1  --     Chemistries  Recent Labs  Lab 12/31/18 1755 01/01/19 0619  NA 141 139  K 3.3* 3.9  CL 106 105  CO2 27 26  GLUCOSE 117* 215*  BUN 17 15  CREATININE 0.80 0.72  CALCIUM 9.1 9.4  AST  --  13*  ALT  --  13  ALKPHOS  --  61  BILITOT  --  0.7   ------------------------------------------------------------------------------------------------------------------ No results for input(s): CHOL, HDL, LDLCALC, TRIG, CHOLHDL, LDLDIRECT in the last 72 hours.  Lab Results  Component Value Date   HGBA1C 6.1 (H) 11/06/2016   ------------------------------------------------------------------------------------------------------------------ No results for input(s): TSH, T4TOTAL, T3FREE, THYROIDAB in the last 72 hours.  Invalid input(s): FREET3 ------------------------------------------------------------------------------------------------------------------ No results for input(s): VITAMINB12, FOLATE, FERRITIN, TIBC, IRON, RETICCTPCT in the last 72 hours.  Coagulation profile No results for input(s): INR, PROTIME in the last 168 hours.  No results for input(s): DDIMER in the last 72 hours.  Cardiac Enzymes No results for input(s): CKMB, TROPONINI,  MYOGLOBIN in the last 168 hours.  Invalid input(s): CK ------------------------------------------------------------------------------------------------------------------    Component Value Date/Time   BNP 46.0 12/31/2018 1755     Emlyn Maves M.D on 01/02/2019 at 6:28 PM  Go to [email or web address] - for contact info  [facility] - Office  [phone]

## 2019-01-03 MED ORDER — FUROSEMIDE 80 MG PO TABS: 80.0000 mg | ORAL_TABLET | Freq: Every day | ORAL | 3 refills | Status: DC

## 2019-01-03 MED ORDER — ASPIRIN 81 MG PO TABS: 81.0000 mg | ORAL_TABLET | Freq: Every day | ORAL | 1 refills | Status: DC

## 2019-01-03 MED ORDER — ALBUTEROL SULFATE (2.5 MG/3ML) 0.083% IN NEBU: 2.5000 mg | INHALATION_SOLUTION | Freq: Four times a day (QID) | RESPIRATORY_TRACT | 12 refills | Status: DC | PRN

## 2019-01-03 MED ORDER — ACETAMINOPHEN 325 MG PO TABS: 650.0000 mg | ORAL_TABLET | Freq: Four times a day (QID) | ORAL | 2 refills | Status: DC | PRN

## 2019-01-03 MED ORDER — ALBUTEROL SULFATE HFA 108 (90 BASE) MCG/ACT IN AERS: 2.0000 | INHALATION_SPRAY | Freq: Four times a day (QID) | RESPIRATORY_TRACT | 1 refills | Status: DC | PRN

## 2019-01-03 MED ORDER — DOXYCYCLINE HYCLATE 100 MG PO TABS: 100.0000 mg | ORAL_TABLET | Freq: Two times a day (BID) | ORAL | 0 refills | Status: AC

## 2019-01-03 MED ORDER — AMLODIPINE BESYLATE 2.5 MG PO TABS: 2.5000 mg | ORAL_TABLET | Freq: Every day | ORAL | 2 refills | Status: DC

## 2019-01-03 MED ORDER — UMECLIDINIUM-VILANTEROL 62.5-25 MCG/INH IN AEPB: 1.0000 | INHALATION_SPRAY | Freq: Every day | RESPIRATORY_TRACT | 3 refills | Status: DC

## 2019-01-03 MED ORDER — PREDNISONE 20 MG PO TABS: 20.0000 mg | ORAL_TABLET | Freq: Every day | ORAL | 0 refills | Status: DC

## 2019-01-03 MED ORDER — GUAIFENESIN ER 600 MG PO TB12: 600.0000 mg | ORAL_TABLET | Freq: Two times a day (BID) | ORAL | 0 refills | Status: DC

## 2019-01-03 MED ORDER — ATORVASTATIN CALCIUM 80 MG PO TABS: 80.0000 mg | ORAL_TABLET | Freq: Every day | ORAL | 3 refills | Status: AC

## 2019-01-03 NOTE — TOC Transition Note (Signed)
Transition of Care [facility]) - CM/SW Discharge Note   Patient Details  Name: Debbie [name] MRN: [id number] Date of Birth: 1941-03-18  [facility] CM/SW Contact:  Latanya Maudlin, RN Phone Number: 01/03/2019, 1:55 PM   Clinical Narrative:  Patient to be discharged per MD order. Orders in place for home health services. CMS Medicare.gov Compare Post Acute Care list reviewed with patient she has no preference of agency. Referral placed with Corene Cornea at [facility] care. DME rolling walker and nebulizer ordered. Obtained from [place] at [facility]. Since patient is in [place] area, we will plan for home delivery of DME.     Final next level of care: Home w Home Health Services Barriers to Discharge: No Barriers Identified   Patient Goals and CMS Choice   CMS Medicare.gov Compare Post Acute Care list provided to:: Patient Choice offered to / list presented to : Patient  Discharge Placement                       Discharge Plan and Services                DME Arranged: Nebulizer machine, Walker rolling DME Agency: [employer] Date DME Agency Contacted: 01/03/19 Time DME Agency Contacted: [phone] Representative spoke with at DME Agency: [place]: RN, PT [place] Agency: [place] ([place]) Date [place]: 01/03/19 Time [place]: 1354 Representative spoke with at [place]: [place] ([place]) Interventions     Readmission Risk Interventions No flowsheet data found.

## 2019-01-03 NOTE — Discharge Instructions (Signed)
1) use breathing (nebulizer)  treatments as advised 2) use your inhalers as prescribed 3) follow-up with your regular doctor in about a week for recheck and reevaluation   ---needs Nebulizer machine,   Home health RN and Home Health PT  - Needs rolling walker

## 2019-01-03 NOTE — Progress Notes (Signed)
Patient at bedside with husband. Discharge instructions given verbally to both parties. P/W was presented after all questions answered. Patient stated understanding to ongoing plans, treatment, and instructions. Elodia Florence RN 01/03/2019 1530

## 2019-01-03 NOTE — Discharge Summary (Signed)
Debbie [name], is a 78 y.o. female  DOB 1941-04-11  MRN [id number].  Admission date:  12/31/2018  Admitting Physician  Pearson Grippe, MD  Discharge Date:  01/03/2019   Primary MD  [name], MD  Recommendations for primary care physician for things to follow:   1) use breathing (nebulizer)  treatments as advised 2) use your inhalers as prescribed 3) follow-up with your regular doctor in about a week for recheck and reevaluation   Admission Diagnosis  Hypoxia [R09.02] COPD with acute exacerbation (HCC) [J44.1] Acute on chronic congestive heart failure, unspecified heart failure type (HCC) [I50.9]   Discharge Diagnosis  Hypoxia [R09.02] COPD with acute exacerbation (HCC) [J44.1] Acute on chronic congestive heart failure, unspecified heart failure type (HCC) [I50.9]    Principal Problem:   Dyspnea Active Problems:   Essential hypertension   Hypothyroidism   Hyperlipidemia   Hypokalemia   Edema   COPD with acute exacerbation (HCC)      Past Medical History:  Diagnosis Date  . Allergic rhinitis   . Anxiety   . Arthritis   . Asthma   . Bladder spasms   . CAD (coronary artery disease)    CABG 2006 [facility]  . Carotid artery disease (HCC)   . Collagen vascular disease (HCC)   . COPD (chronic obstructive pulmonary disease) (HCC)   . Cystocele   . Essential hypertension   . GERD (gastroesophageal reflux disease)   . Hyperlipidemia   . Hypothyroidism   . Hypothyroidism   . Osteoporosis   . Renal insufficiency   . Vaginal vault prolapse     Past Surgical History:  Procedure Laterality Date  . ANTERIOR AND POSTERIOR REPAIR N/A 04/06/2013   Procedure: CYSTOCELE REPAIR ;  Surgeon: Martina Sinner, MD;  Location: [facility] ORS;  Service: Urology;  Laterality: N/A;  . CATARACTS     REMOVED  . COLONOSCOPY    . CORNEA LACERATION REPAIR    . CORONARY ARTERY BYPASS GRAFT     X 5  VESSELS  . CYSTOSCOPY N/A 04/06/2013   Procedure: CYSTOSCOPY;  Surgeon: Martina Sinner, MD;  Location: [facility] ORS;  Service: Urology;  Laterality: N/A;  . HERNIA REPAIR    . SEPTOPLASTY N/A 04/28/2015   Procedure: SEPTOPLASTY;  Surgeon: Melvenia Beam, MD;  Location: [facility] OR;  Service: ENT;  Laterality: N/A;  . SINUS ENDO W/FUSION Bilateral 04/28/2015   Procedure: ENDOSCOPIC SINUS SURGERY WITH NAVIGATION;  Surgeon: Melvenia Beam, MD;  Location: [facility] OR;  Service: ENT;  Laterality: Bilateral;  . TUBAL LIGATION         HPI  from the history and physical done on the day of admission:    Debbie [name]  is a 78 y.o. female,  w hypertension, Hyperlipidemia, glucose intolerance (hga1c=6.1 11/06/2016), CAD, s/p CABG, carotid stenosis, gerd,  hypothyroidism, Copd not on home o2, apparently c/o dyspnea and lower extremity swelling.  Pt presented to ED due to dyspnea with walking and generalized weakness of her legs.  Pt denies fever, chills,  cough, cp, palp, n/v, abd pain, diarrhea, dysuria, hematuria, back pain, focal neurological symptoms.    Pt notes that she has discontinued about 5 inhalers that she was previously taking.   In ED,  T 98.9, P 83 R 24, Bp 155/73  Pox 98% on RA  CXR IMPRESSION: No acute cardiopulmonary process.  Na 141, K 3.3, Bun 17, Creatinine 0.80 Wbc 9.8, Hgb 12.4, Plt 372 Urinalysis negative  Trop 4 BNP 46    Hospital Course:    Brief Summary:- 78 y.o.female,w hypertension, Hyperlipidemia, glucose intolerance (hga1c=6.1 11/06/2016), CAD, s/p CABG, carotid stenosis, gerd, hypothyroidism, Copd not on home o2- admitted on 12/31/18 with COPD exacerbation in the setting of noncompliance with bronchodilators  A/p  1)Acute COPD Exacerbation- no definite pneumonia, -patient was treated with IV Solu-Medrol ,  mucolytics, doxycycline and bronchodilators as ordered, supplemental oxygen as ordered.  -Improved significantly, hypoxia resolved, -Patient was ambulated  today post ambulation O2 sats 99% on room air -Discharge home on p.o. prednisone, doxycycline, nebulizer machine and bronchodilators   2)Acute Hypoxic Respiratory Failure--- secondary to COPD exacerbation as above #1, treat as above #1 -Hypoxia resolved as noted above #1  3)Dyspnea plus LE edema--echo with EF of 60 to 65%, lower extremity edema noted, treated with Lasix, advised to use TEDs stockings  4)H/o CAD - s/p prior CABG-stable, no ACS type symptoms, continue Lipitor 80 mg daily, continue aspirin 81 mg daily, continue isosorbide 30 mg daily -Stable, chest pain-free  5)Hypothyroidism-continue levothyroxine 88 mcg daily  6)HTN--stable, decrease amlodipine to 2.5 mg  daily due to soft BP, continue lisinopril 20 mg daily   Code Status : Full   Family Communication:  (patient is alert, awake and coherent) --Plan of care discussed with patient and family member at bedside questions answered prior to discharge  Disposition Plan  : home   Discharge Condition: Stable  Follow UP--PCP as advised   Consults obtained - NA  Diet and Activity recommendation:  As advised  Discharge Instructions    Discharge Instructions    Call MD for:  difficulty breathing, headache or visual disturbances   Complete by: As directed    Call MD for:  persistant dizziness or light-headedness   Complete by: As directed    Call MD for:  persistant nausea and vomiting   Complete by: As directed    Call MD for:  severe uncontrolled pain   Complete by: As directed    Call MD for:  temperature >100.4   Complete by: As directed    Diet - low sodium heart healthy   Complete by: As directed    Discharge instructions   Complete by: As directed    1) use breathing (nebulizer)  treatments as advised 2) use your inhalers as prescribed 3) follow-up with your regular doctor in about a week for recheck and reevaluation   Increase activity slowly   Complete by: As directed         Discharge  Medications     Allergies as of 01/03/2019      Reactions   Ivp Dye [iodinated Diagnostic Agents] Swelling   Kidney Dye   Betadine [povidone Iodine] Rash   Codeine Nausea And Vomiting   Other Other (See Comments)   ALL NARCOTICS   Penicillins Rash   Did it involve swelling of the face/tongue/throat, SOB, or low BP? No Did it involve sudden or severe rash/hives, skin peeling, or any reaction on the inside of your mouth or nose? No Did you need to  seek medical attention at a hospital or doctor's office? No When did it last happen? If all above answers are "NO", may proceed with cephalosporin use.   Sulfa Antibiotics Rash      Medication List    STOP taking these medications   ibuprofen 200 MG tablet Commonly known as: ADVIL     TAKE these medications   acetaminophen 325 MG tablet Commonly known as: TYLENOL Take 2 tablets (650 mg total) by mouth every 6 (six) hours as needed for mild pain, fever or headache (or Fever >/= 101).   albuterol 108 (90 Base) MCG/ACT inhaler Commonly known as: VENTOLIN HFA Inhale 2 puffs into the lungs every 6 (six) hours as needed for wheezing or shortness of breath.   albuterol (2.5 MG/3ML) 0.083% nebulizer solution Commonly known as: PROVENTIL Take 3 mLs (2.5 mg total) by nebulization every 6 (six) hours as needed for wheezing or shortness of breath.   ALPRAZolam 0.25 MG tablet Commonly known as: XANAX Take 1 tablet (0.25 mg total) by mouth 2 (two) times daily as needed for anxiety.   amLODipine 2.5 MG tablet Commonly known as: NORVASC Take 1 tablet (2.5 mg total) by mouth daily. Start taking on: January 04, 2019 What changed:   medication strength  how much to take   ARTIFICIAL TEARS OP Place 1 drop into both eyes daily as needed (dry eyes).   aspirin 81 MG tablet Take 1 tablet (81 mg total) by mouth daily with breakfast. What changed: when to take this   atorvastatin 80 MG tablet Commonly known as: LIPITOR Take 1 tablet  (80 mg total) by mouth at bedtime.   baclofen 10 MG tablet Commonly known as: LIORESAL Take 10 mg by mouth daily.   Biotin 5000 MCG Caps Take 5,000 mcg by mouth daily.   cholecalciferol 1000 units tablet Commonly known as: VITAMIN D Take 1,000 Units by mouth daily.   doxycycline 100 MG tablet Commonly known as: VIBRA-TABS Take 1 tablet (100 mg total) by mouth 2 (two) times daily for 5 days.   famotidine 20 MG tablet Commonly known as: PEPCID Take 20 mg by mouth daily.   fluticasone 50 MCG/ACT nasal spray Commonly known as: FLONASE Place 2 sprays into the nose daily as needed for allergies or rhinitis.   furosemide 80 MG tablet Commonly known as: LASIX Take 1 tablet (80 mg total) by mouth daily. What changed:   medication strength  how much to take   guaiFENesin 600 MG 12 hr tablet Commonly known as: MUCINEX Take 1 tablet (600 mg total) by mouth 2 (two) times daily.   isosorbide mononitrate 30 MG 24 hr tablet Commonly known as: IMDUR Take 30 mg by mouth daily.   levothyroxine 88 MCG tablet Commonly known as: SYNTHROID Take 88 mcg by mouth daily.   lisinopril 20 MG tablet Commonly known as: ZESTRIL Take 1 tablet (20 mg total) by mouth daily.   montelukast 10 MG tablet Commonly known as: SINGULAIR Take 10 mg by mouth at bedtime.   nitroGLYCERIN 0.4 MG SL tablet Commonly known as: NITROSTAT Place 1 tablet (0.4 mg total) under the tongue every 5 (five) minutes x 3 doses as needed for chest pain (if no relief after 3rd dose, call 911 or proceed to the ED for an evaluation).   potassium chloride 10 MEQ CR capsule Commonly known as: MICRO-K Take 20 mEq by mouth 2 (two) times daily.   predniSONE 20 MG tablet Commonly known as: Deltasone Take 1 tablet (20 mg  total) by mouth daily with breakfast.   umeclidinium-vilanterol 62.5-25 MCG/INH Aepb Commonly known as: ANORO ELLIPTA Inhale 1 puff into the lungs daily. Start taking on: January 04, 2019             Durable Medical Equipment  (From admission, onward)         Start     Ordered   01/03/19 1323  For home use only DME Walker rolling  Once    Comments: Copd and CAD with generalized weakness and dizziness  Question:  Patient needs a walker to treat with the following condition  Answer:  Dizziness   01/03/19 1323   01/03/19 1312  For home use only DME Nebulizer machine  Once    Question Answer Comment  Patient needs a nebulizer to treat with the following condition Obstructive chronic bronchitis with exacerbation ([place])   Length of Need Lifetime      01/03/19 1312          Major procedures and Radiology Reports - PLEASE review detailed and final reports for all details, in brief -   Dg Chest Port 1 View  Result Date: 12/31/2018 CLINICAL DATA:  Shortness of breath and weight gain EXAM: PORTABLE CHEST 1 VIEW COMPARISON:  December 29, 2018 FINDINGS: The patient is slightly rotated. The cardiomediastinal silhouette is unchanged. Overlying median sternotomy wires are present. The lungs are clear. No focal airspace consolidation or pleural effusion. No acute osseous finding. IMPRESSION: No acute cardiopulmonary process. Electronically Signed   By: Prudencio Pair M.D.   On: 12/31/2018 18:12    Micro Results   Recent Results (from the past 240 hour(s))  SARS Coronavirus 2 [facility] order, Performed in [facility] hospital lab) Nasopharyngeal Nasopharyngeal Swab     Status: None   Collection Time: 12/31/18  6:53 PM   Specimen: Nasopharyngeal Swab  Result Value Ref Range Status   SARS Coronavirus 2 NEGATIVE NEGATIVE Final    Comment: (NOTE) If result is NEGATIVE SARS-CoV-2 target nucleic acids are NOT DETECTED. The SARS-CoV-2 RNA is generally detectable in upper and lower  respiratory specimens during the acute phase of infection. The lowest  concentration of SARS-CoV-2 viral copies this assay can detect is 250  copies / mL. A negative result does not preclude SARS-CoV-2 infection  and  should not be used as the sole basis for treatment or other  patient management decisions.  A negative result may occur with  improper specimen collection / handling, submission of specimen other  than nasopharyngeal swab, presence of viral mutation(s) within the  areas targeted by this assay, and inadequate number of viral copies  (<250 copies / mL). A negative result must be combined with clinical  observations, patient history, and epidemiological information. If result is POSITIVE SARS-CoV-2 target nucleic acids are DETECTED. The SARS-CoV-2 RNA is generally detectable in upper and lower  respiratory specimens dur ing the acute phase of infection.  Positive  results are indicative of active infection with SARS-CoV-2.  Clinical  correlation with patient history and other diagnostic information is  necessary to determine patient infection status.  Positive results do  not rule out bacterial infection or co-infection with other viruses. If result is PRESUMPTIVE POSTIVE SARS-CoV-2 nucleic acids MAY BE PRESENT.   A presumptive positive result was obtained on the submitted specimen  and confirmed on repeat testing.  While 2019 novel coronavirus  (SARS-CoV-2) nucleic acids may be present in the submitted sample  additional confirmatory testing may be necessary for epidemiological  and /  or clinical management purposes  to differentiate between  SARS-CoV-2 and other Sarbecovirus currently known to infect humans.  If clinically indicated additional testing with an alternate test  methodology [phone] is advised. The SARS-CoV-2 RNA is generally  detectable in upper and lower respiratory sp ecimens during the acute  phase of infection. The expected result is Negative. Fact Sheet for Patients:  BoilerBrush.com.cyhttps://www.fda.gov/media/136312/download Fact Sheet for Healthcare Providers: https://pope.com/https://www.fda.gov/media/136313/download This test is not yet approved or cleared by the [place] FDA and has been  authorized for detection and/or diagnosis of SARS-CoV-2 by FDA under an Emergency Use Authorization (EUA).  This EUA will remain in effect (meaning this test can be used) for the duration of the COVID-19 declaration under Section 564(b)(1) of the Act, 21 U.S.C. section 360bbb-3(b)(1), unless the authorization is terminated or revoked sooner. Performed at [facility], [address] [place], [place] [address]        Today   Subjective    Debbie [name] today has no new complaints --Ambulating around without dizziness dyspnea on exertion or hypoxia  -Cough and congestion-significantly improved          Patient has been seen and examined prior to discharge   Objective   Blood pressure 129/76, pulse (!) 58, temperature 98.1 F (36.7 C), temperature source Oral, resp. rate 18, height 5\' 3"  (1.6 m), weight 61.6 kg, SpO2 98 %.   Intake/Output Summary (Last 24 hours) at 01/03/2019 1625 Last data filed at 01/03/2019 0900 Gross per 24 hour  Intake 720 ml  Output -  Net 720 ml    Exam Gen:- Awake Alert, In no acute distress, able to speak in sentences  HEENT:- [place].AT, No sclera icterus Neck-Supple Neck,No JVD,.  Lungs-improved air movement, no wheezing  CV- S1, S2 normal, regular, prior sternotomy scar Abd-  +ve B.Sounds, Abd Soft, No tenderness,    Extremity/Skin:- trace  edema, pedal pulses present  Psych-affect is appropriate, oriented x3 Neuro-no new focal deficits, no tremors   Data Review   CBC w Diff:  Lab Results  Component Value Date   WBC 6.6 01/01/2019   HGB 12.3 01/01/2019   HCT 38.7 01/01/2019   PLT 341 01/01/2019   LYMPHOPCT 25 12/31/2018   MONOPCT 13 12/31/2018   EOSPCT 2 12/31/2018   BASOPCT 1 12/31/2018    CMP:  Lab Results  Component Value Date   NA 139 01/01/2019   K 3.9 01/01/2019   CL 105 01/01/2019   CO2 26 01/01/2019   BUN 15 01/01/2019   CREATININE 0.72 01/01/2019   CREATININE 1.00 08/01/2014   PROT 5.8 (L) 01/01/2019   ALBUMIN  3.3 (L) 01/01/2019   BILITOT 0.7 01/01/2019   ALKPHOS 61 01/01/2019   AST 13 (L) 01/01/2019   ALT 13 01/01/2019  . Total Discharge time is about 33 minutes  Shon Haleourage Ilan Kahrs M.D on 01/03/2019 at 4:25 PM  Go to [email or web address] -  for contact info  [facility] - Office  [phone]

## 2019-01-06 ENCOUNTER — Telehealth (INDEPENDENT_AMBULATORY_CARE_PROVIDER_SITE_OTHER): Payer: Medicare Other | Admitting: Cardiology

## 2019-01-06 ENCOUNTER — Encounter: Payer: Self-pay | Admitting: Cardiology

## 2019-01-06 VITALS — BP 85/56 | HR 66 | Ht 63.5 in | Wt 126.4 lb

## 2019-01-06 DIAGNOSIS — I6523 Occlusion and stenosis of bilateral carotid arteries: Secondary | ICD-10-CM

## 2019-01-06 DIAGNOSIS — I10 Essential (primary) hypertension: Secondary | ICD-10-CM

## 2019-01-06 DIAGNOSIS — J449 Chronic obstructive pulmonary disease, unspecified: Secondary | ICD-10-CM

## 2019-01-06 DIAGNOSIS — E782 Mixed hyperlipidemia: Secondary | ICD-10-CM

## 2019-01-06 DIAGNOSIS — I25119 Atherosclerotic heart disease of native coronary artery with unspecified angina pectoris: Secondary | ICD-10-CM

## 2019-01-06 MED ORDER — NITROGLYCERIN 0.4 MG SL SUBL: 0.4000 mg | SUBLINGUAL_TABLET | SUBLINGUAL | 2 refills | Status: DC | PRN

## 2019-01-06 NOTE — Patient Instructions (Addendum)
Medication Instructions:   Your physician has recommended you make the following change in your medication:   Stop amlodipine 2.5 mg  Continue all other medications the same  Labwork:  NONE  Testing/Procedures:  NONE  Follow-Up:  Your physician recommends that you schedule a follow-up appointment in: 4-6 weeks with Bernerd Pho at the [place] office.   Any Other Special Instructions Will Be Listed Below (If Applicable).  If you need a refill on your cardiac medications before your next appointment, please call your pharmacy.

## 2019-01-06 NOTE — Progress Notes (Signed)
Virtual Visit via Telephone Note   This visit type was conducted due to national recommendations for restrictions regarding the COVID-19 Pandemic (e.g. social distancing) in an effort to limit this patient's exposure and mitigate transmission in our community.  Due to her co-morbid illnesses, this patient is at least at moderate risk for complications without adequate follow up.  This format is felt to be most appropriate for this patient at this time.  The patient did not have access to video technology/had technical difficulties with video requiring transitioning to audio format only (telephone).  All issues noted in this document were discussed and addressed.  No physical exam could be performed with this format.  Please refer to the patient's chart for her  consent to telehealth for [facility].   Date:  01/06/2019   ID:  Debbie [name], DOB 07-Jan-1941, MRN [id number]  Patient Location: Home Provider Location: Office  PCP:  Kirstie PeriShah, Ashish, MD  Cardiologist:  Nona DellSamuel McDowell, MD Electrophysiologist:  None   Evaluation Performed:  Follow-Up Visit  Chief Complaint:   Cardiac follow-up  History of Present Illness:    Debbie [name] is a 78 y.o. female last seen in September 2019.  She did not have video access and we spoke by phone today.  I reviewed interval records, she was actually just discharged from the hospital August 16 after management of COPD exacerbation with acute hypoxic respiratory failure.  SARS coronavirus 2 test was negative.  High-sensitivity troponin I levels were also negative.  She had a follow-up echocardiogram done during her hospital stay showing LVEF 60 to 65%.  She tells me that she feels somewhat better, still weak and somewhat wobbly.  Her blood pressure was low today and I went over her medications with her.  We discussed stopping Norvasc for now, she might even need further down titration of ACE inhibitor.  Otherwise her fluid status has been  reasonably well-controlled on current dose of Lasix.  The patient does not have symptoms concerning for COVID-19 infection (fever, chills, cough, or new shortness of breath).    Past Medical History:  Diagnosis Date  . Allergic rhinitis   . Anxiety   . Arthritis   . Asthma   . Bladder spasms   . CAD (coronary artery disease)    [facility] [place]  . Carotid artery disease (HCC)   . Collagen vascular disease (HCC)   . COPD (chronic obstructive pulmonary disease) (HCC)   . Cystocele   . Essential hypertension   . GERD (gastroesophageal reflux disease)   . Hyperlipidemia   . Hypothyroidism   . Hypothyroidism   . Osteoporosis   . Renal insufficiency   . Vaginal vault prolapse    Past Surgical History:  Procedure Laterality Date  . ANTERIOR AND POSTERIOR REPAIR N/A 04/06/2013   Procedure: CYSTOCELE REPAIR ;  Surgeon: Martina SinnerScott A MacDiarmid, MD;  Location: [facility];  Service: Urology;  Laterality: N/A;  . CATARACTS     REMOVED  . COLONOSCOPY    . CORNEA LACERATION REPAIR    . CORONARY ARTERY BYPASS GRAFT     X 5 VESSELS  . CYSTOSCOPY N/A 04/06/2013   Procedure: CYSTOSCOPY;  Surgeon: Martina SinnerScott A MacDiarmid, MD;  Location: [facility];  Service: Urology;  Laterality: N/A;  . HERNIA REPAIR    . SEPTOPLASTY N/A 04/28/2015   Procedure: SEPTOPLASTY;  Surgeon: Melvenia BeamMitchell Gore, MD;  [facility];  Service: ENT;  Laterality: N/A;  . SINUS ENDO W/FUSION Bilateral 04/28/2015  Procedure: ENDOSCOPIC SINUS SURGERY WITH NAVIGATION;  Surgeon: Melvenia BeamMitchell Gore, MD;  [facility];  Service: ENT;  Laterality: Bilateral;  . TUBAL LIGATION       Current Meds  Medication Sig  . acetaminophen (TYLENOL) 325 MG tablet Take 2 tablets (650 mg total) by mouth every 6 (six) hours as needed for mild pain, fever or headache (or Fever >/= 101).  [name]. albuterol (PROVENTIL) (2.5 MG/3ML) 0.083% nebulizer solution Take 3 mLs (2.5 mg total) by nebulization every 6 (six) hours as needed for wheezing or shortness of  breath.  [name]. albuterol (VENTOLIN HFA) 108 (90 Base) MCG/ACT inhaler Inhale 2 puffs into the lungs every 6 (six) hours as needed for wheezing or shortness of breath.  . ALPRAZolam (XANAX) 0.25 MG tablet Take 1 tablet (0.25 mg total) by mouth 2 (two) times daily as needed for anxiety.  [name]. aspirin 81 MG tablet Take 1 tablet (81 mg total) by mouth daily with breakfast.  . atorvastatin (LIPITOR) 80 MG tablet Take 1 tablet (80 mg total) by mouth at bedtime.  . baclofen (LIORESAL) 10 MG tablet Take 10 mg by mouth daily.  . Biotin 5000 MCG CAPS Take 5,000 mcg by mouth daily.   . cholecalciferol (VITAMIN D) 1000 units tablet Take 1,000 Units by mouth daily.  [name]. doxycycline (VIBRA-TABS) 100 MG tablet Take 1 tablet (100 mg total) by mouth 2 (two) times daily for 5 days.  . famotidine (PEPCID) 20 MG tablet Take 20 mg by mouth daily.  . fluticasone (FLONASE) 50 MCG/ACT nasal spray Place 2 sprays into the nose daily as needed for allergies or rhinitis.   . furosemide (LASIX) 80 MG tablet Take 1 tablet (80 mg total) by mouth daily.  [name]. guaiFENesin (MUCINEX) 600 MG 12 hr tablet Take 1 tablet (600 mg total) by mouth 2 (two) times daily.  . Hypromellose (ARTIFICIAL TEARS OP) Place 1 drop into both eyes daily as needed (dry eyes).  . isosorbide mononitrate (IMDUR) 30 MG 24 hr tablet Take 30 mg by mouth daily.  [name]. levothyroxine (SYNTHROID) 88 MCG tablet Take 88 mcg by mouth daily.  [name]. lisinopril (PRINIVIL,ZESTRIL) 20 MG tablet Take 1 tablet (20 mg total) by mouth daily.  . montelukast (SINGULAIR) 10 MG tablet Take 10 mg by mouth at bedtime.  . nitroGLYCERIN (NITROSTAT) 0.4 MG SL tablet Place 1 tablet (0.4 mg total) under the tongue every 5 (five) minutes x 3 doses as needed for chest pain (if no relief after 3rd dose, call 911 or proceed to the ED for an evaluation).  . potassium chloride (MICRO-K) 10 MEQ CR capsule Take 20 mEq by mouth 2 (two) times daily.   . predniSONE (DELTASONE) 20 MG tablet Take 1 tablet (20 mg  total) by mouth daily with breakfast.  . umeclidinium-vilanterol (ANORO ELLIPTA) 62.5-25 MCG/INH AEPB Inhale 1 puff into the lungs daily.  . [DISCONTINUED] amLODipine (NORVASC) 2.5 MG tablet Take 1 tablet (2.5 mg total) by mouth daily.  . [DISCONTINUED] nitroGLYCERIN (NITROSTAT) 0.4 MG SL tablet Place 1 tablet (0.4 mg total) under the tongue every 5 (five) minutes x 3 doses as needed for chest pain (if no relief after 3rd dose, call 911 or proceed to the ED for an evaluation).     Allergies:   Ivp dye [iodinated diagnostic agents], Betadine [povidone iodine], Codeine, Other, Penicillins, and Sulfa antibiotics   Social History   Tobacco Use  . Smoking status: Never Smoker  . Smokeless tobacco: Never Used  Substance Use Topics  .  Alcohol use: Yes    Alcohol/week: 0.0 standard drinks    Comment: 1 drink 1 time a year  . Drug use: No     Family Hx: The patient's family history includes Cirrhosis in her father; Diabetes Mellitus II in her mother; Heart Problems in her mother; Lung disease in her father.  ROS:   Please see the history of present illness. All other systems reviewed and are negative.   Prior CV studies:   The following studies were reviewed today:  Echocardiogram 01/01/2019:  1. The left ventricle has normal systolic function with an ejection fraction of 60-65%. The cavity size was normal. There is mild concentric left ventricular hypertrophy. Left ventricular diastolic Doppler parameters are consistent with impaired  relaxation. Indeterminate filling pressures No evidence of left ventricular regional wall motion abnormalities.  2. The right ventricle has normal systolic function. The cavity was normal. There is no increase in right ventricular wall thickness.  3. Left atrial size was moderately dilated.  4. The mitral valve is grossly normal.  5. The tricuspid valve is grossly normal.  6. The aortic valve is tricuspid.  7. The aorta is normal unless otherwise noted.   Chest x-ray 12/31/2018: FINDINGS: The patient is slightly rotated. The cardiomediastinal silhouette is unchanged. Overlying median sternotomy wires are present. The lungs are clear. No focal airspace consolidation or pleural effusion. No acute osseous finding.  IMPRESSION: No acute cardiopulmonary process.  Carotid Dopplers 11/06/2016: Summary: Bilateral - 1% to 39% ICA stenosis. Vertebral artery flow is antegrade.  Labs/Other Tests and Data Reviewed:    EKG:  An ECG dated 12/31/2018 was personally reviewed today and demonstrated:  Sinus rhythm with increased voltage and nonspecific T wave changes.  Recent Labs: 12/31/2018: B Natriuretic Peptide 46.0 01/01/2019: ALT 13; BUN 15; Creatinine, Ser 0.72; Hemoglobin 12.3; Platelets 341; Potassium 3.9; Sodium 139   Recent Lipid Panel Lab Results  Component Value Date/Time   CHOL 149 04/01/2017 05:44 AM   TRIG 147 04/01/2017 05:44 AM   HDL 39 (L) 04/01/2017 05:44 AM   CHOLHDL 3.8 04/01/2017 05:44 AM   LDLCALC 81 04/01/2017 05:44 AM    Wt Readings from Last 3 Encounters:  01/06/19 126 lb 6.4 oz (57.3 kg)  01/03/19 135 lb 12.9 oz (61.6 kg)  01/30/18 137 lb (62.1 kg)     Objective:    Vital Signs:  BP (!) 85/56   Pulse 66   Ht 5' 3.5" (1.613 m)   Wt 126 lb 6.4 oz (57.3 kg)   BMI 22.04 kg/m    Patient spoke in full sentences, not short of breath. No audible wheezing or coughing. Speech pattern normal.  ASSESSMENT & PLAN:    1.  CAD status post CABG in 2006.  She does not report any recent chest pain, was hospitalized with COPD exacerbation and high-sensitivity troponin I levels were negative.  Follow-up echocardiogram also shows normal LVEF at 60 to 65%.  At this point would continue with medical therapy which includes aspirin and statin.  2.  History of essential hypertension, currently hypotensive.  I asked her to stop Norvasc.  She will continue to track blood pressure as she may require further medication adjustments.   3.  Mixed hyperlipidemia on Lipitor.  Last LDL 81.  4.  COPD with recent exacerbation and hospital stay.  She follows with Dr. Sherryll BurgerShah.  COVID-19 Education: The signs and symptoms of COVID-19 were discussed with the patient and how to seek care for testing (follow up with PCP  or arrange E-visit).  The importance of social distancing was discussed today.  Time:   Today, I have spent 6 minutes with the patient with telehealth technology discussing the above problems.     Medication Adjustments/Labs and Tests Ordered: Current medicines are reviewed at length with the patient today.  Concerns regarding medicines are outlined above.   Tests Ordered: No orders of the defined types were placed in this encounter.   Medication Changes: Meds ordered this encounter  Medications  . nitroGLYCERIN (NITROSTAT) 0.4 MG SL tablet    Sig: Place 1 tablet (0.4 mg total) under the tongue every 5 (five) minutes x 3 doses as needed for chest pain (if no relief after 3rd dose, call 911 or proceed to the ED for an evaluation).    Dispense:  25 tablet    Refill:  2    Follow Up:  In Person 4 to 6 weeks in the [place] office with [place].  Signed, Rozann Lesches, MD  01/06/2019 3:37 PM    [place]

## 2019-01-15 ENCOUNTER — Ambulatory Visit: Payer: Medicare HMO | Admitting: Cardiology

## 2019-02-10 ENCOUNTER — Telehealth (HOSPITAL_COMMUNITY): Payer: Self-pay

## 2019-02-10 ENCOUNTER — Other Ambulatory Visit: Payer: Self-pay

## 2019-02-10 DIAGNOSIS — M79604 Pain in right leg: Secondary | ICD-10-CM

## 2019-02-10 DIAGNOSIS — M79605 Pain in left leg: Secondary | ICD-10-CM

## 2019-02-10 NOTE — Telephone Encounter (Signed)
Voicemail was left for patient including appointment time and information and instructions regarding COVID-19 safety procedures including mask usage, rescheduling if sympotmatic, and limited visitors in testing area.  

## 2019-02-11 ENCOUNTER — Encounter: Payer: Medicare Other | Admitting: Vascular Surgery

## 2019-02-11 ENCOUNTER — Ambulatory Visit (HOSPITAL_COMMUNITY): Admission: RE | Admit: 2019-02-11 | Payer: Medicare Other | Source: Ambulatory Visit

## 2019-02-11 ENCOUNTER — Encounter: Payer: Self-pay | Admitting: Family

## 2019-02-17 ENCOUNTER — Ambulatory Visit: Payer: Medicare Other | Admitting: Student

## 2019-05-25 ENCOUNTER — Other Ambulatory Visit: Payer: Self-pay

## 2019-05-25 ENCOUNTER — Ambulatory Visit: Payer: Medicare Other | Attending: Internal Medicine

## 2019-05-25 DIAGNOSIS — Z20822 Contact with and (suspected) exposure to covid-19: Secondary | ICD-10-CM

## 2019-05-27 ENCOUNTER — Telehealth: Payer: Self-pay | Admitting: *Deleted

## 2019-05-27 LAB — NOVEL CORONAVIRUS, NAA: SARS-CoV-2, NAA: NOT DETECTED

## 2019-05-27 NOTE — Telephone Encounter (Signed)
Pt notified that her covid 19 test was negative. She voiced understanding.

## 2019-06-10 DIAGNOSIS — J441 Chronic obstructive pulmonary disease with (acute) exacerbation: Secondary | ICD-10-CM | POA: Diagnosis not present

## 2019-06-10 DIAGNOSIS — R0902 Hypoxemia: Secondary | ICD-10-CM | POA: Diagnosis not present

## 2019-06-14 DIAGNOSIS — Z6824 Body mass index (BMI) 24.0-24.9, adult: Secondary | ICD-10-CM | POA: Diagnosis not present

## 2019-06-14 DIAGNOSIS — Z299 Encounter for prophylactic measures, unspecified: Secondary | ICD-10-CM | POA: Diagnosis not present

## 2019-06-14 DIAGNOSIS — I10 Essential (primary) hypertension: Secondary | ICD-10-CM | POA: Diagnosis not present

## 2019-06-14 DIAGNOSIS — Z713 Dietary counseling and surveillance: Secondary | ICD-10-CM | POA: Diagnosis not present

## 2019-06-14 DIAGNOSIS — I5032 Chronic diastolic (congestive) heart failure: Secondary | ICD-10-CM | POA: Diagnosis not present

## 2019-06-22 DIAGNOSIS — I779 Disorder of arteries and arterioles, unspecified: Secondary | ICD-10-CM | POA: Diagnosis not present

## 2019-06-22 DIAGNOSIS — I509 Heart failure, unspecified: Secondary | ICD-10-CM | POA: Diagnosis not present

## 2019-06-22 DIAGNOSIS — Z299 Encounter for prophylactic measures, unspecified: Secondary | ICD-10-CM | POA: Diagnosis not present

## 2019-06-22 DIAGNOSIS — K2100 Gastro-esophageal reflux disease with esophagitis, without bleeding: Secondary | ICD-10-CM | POA: Diagnosis not present

## 2019-06-22 DIAGNOSIS — I10 Essential (primary) hypertension: Secondary | ICD-10-CM | POA: Diagnosis not present

## 2019-06-22 DIAGNOSIS — J449 Chronic obstructive pulmonary disease, unspecified: Secondary | ICD-10-CM | POA: Diagnosis not present

## 2019-06-29 ENCOUNTER — Encounter: Payer: Self-pay | Admitting: Gastroenterology

## 2019-07-11 DIAGNOSIS — R0902 Hypoxemia: Secondary | ICD-10-CM | POA: Diagnosis not present

## 2019-07-11 DIAGNOSIS — J441 Chronic obstructive pulmonary disease with (acute) exacerbation: Secondary | ICD-10-CM | POA: Diagnosis not present

## 2019-07-13 ENCOUNTER — Ambulatory Visit: Payer: Medicare Other | Admitting: Gastroenterology

## 2019-07-18 DIAGNOSIS — G4710 Hypersomnia, unspecified: Secondary | ICD-10-CM | POA: Diagnosis not present

## 2019-07-23 DIAGNOSIS — I509 Heart failure, unspecified: Secondary | ICD-10-CM | POA: Diagnosis not present

## 2019-07-23 DIAGNOSIS — N390 Urinary tract infection, site not specified: Secondary | ICD-10-CM | POA: Diagnosis not present

## 2019-07-23 DIAGNOSIS — I10 Essential (primary) hypertension: Secondary | ICD-10-CM | POA: Diagnosis not present

## 2019-07-23 DIAGNOSIS — Z789 Other specified health status: Secondary | ICD-10-CM | POA: Diagnosis not present

## 2019-07-23 DIAGNOSIS — N1831 Chronic kidney disease, stage 3a: Secondary | ICD-10-CM | POA: Diagnosis not present

## 2019-07-23 DIAGNOSIS — E7800 Pure hypercholesterolemia, unspecified: Secondary | ICD-10-CM | POA: Diagnosis not present

## 2019-07-23 DIAGNOSIS — Z299 Encounter for prophylactic measures, unspecified: Secondary | ICD-10-CM | POA: Diagnosis not present

## 2019-07-23 DIAGNOSIS — M545 Low back pain: Secondary | ICD-10-CM | POA: Diagnosis not present

## 2019-07-26 DIAGNOSIS — I509 Heart failure, unspecified: Secondary | ICD-10-CM | POA: Diagnosis not present

## 2019-07-27 ENCOUNTER — Ambulatory Visit: Payer: Medicare Other | Admitting: Gastroenterology

## 2019-07-28 ENCOUNTER — Ambulatory Visit: Payer: Medicare Other | Admitting: Gastroenterology

## 2019-07-29 ENCOUNTER — Encounter: Payer: Self-pay | Admitting: Student

## 2019-08-05 NOTE — Progress Notes (Addendum)
Cardiology Office Note  Date: 08/06/2019   ID: LYFE REIHL, DOB 05-19-1941, MRN [id number]  PCP:  [name], MD  Cardiologist:  [name], MD Electrophysiologist:  None   Chief Complaint: Follow-up CAD, HTN, HLD, mild carotid artery stenosis  History of Present Illness: Debbie [name] is a 79 y.o. female with a history of CAD status post CABG history x 5 2006, HTN, HLD, COPD, mild carotid artery stenosis, dyspnea, lower extremity edema.  She was hospitalized in August 2020 for COPD exacerbation.  She had been of gradual  worsening of persistent shortness of breath and pedal edema leading up to presentation to hospital.  She had been noncompliant with bronchodilator therapy.  She had negative troponins.  Had an echocardiogram which showed normal EF of 60 to 65%.  Mild concentric LVH, Doppler parameters consistent with impaired relaxation.  Her amlodipine was stopped at last telemedicine visit on January 06, 2019.[name]  Her last LDL was 81 in 2018.  A vascular ultrasound ABI w/wo TBI was ordered February 10, 2019 d/t patient complaint of bilateral leg pain and weakness.  I do not see the results of the test.  It is unclear as to whether she had this test performed.  She continues to complain of dyspnea on exertion with some associated chest pain/chest tightness.  She states the chest pain/tightness can occur with and without activity.  She states the chest pain is occurred twice while in the exam room.  She describes it as brief without radiation or associated symptoms.  She did describe an episode the other night where she woke up with significant diaphoresis associated with some nausea and chest pain and dyspnea.  States she took some baby aspirin and this was alleviated.  She states this feels similar to symptoms she had prior to her bypass surgery back in 2006.  She described her symptoms leading up to discovery of her heart disease is primarily dyspnea on exertion.  She  states that she never had chest pain during that time.  Past Medical History:  Diagnosis Date  . Allergic rhinitis   . Anxiety   . Arthritis   . Asthma   . Bladder spasms   . CAD (coronary artery disease)    CABG 2006 [facility]  . Carotid artery disease (HCC)   . Collagen vascular disease (HCC)   . COPD (chronic obstructive pulmonary disease) (HCC)   . Cystocele   . Essential hypertension   . GERD (gastroesophageal reflux disease)   . Hyperlipidemia   . Hypothyroidism   . Hypothyroidism   . Osteoporosis   . Renal insufficiency   . Vaginal vault prolapse     Past Surgical History:  Procedure Laterality Date  . ANTERIOR AND POSTERIOR REPAIR N/A 04/06/2013   Procedure: CYSTOCELE REPAIR ;  Surgeon: Martina Sinner, MD;  Location: [facility];  Service: Urology;  Laterality: N/A;  . CATARACTS     REMOVED  . COLONOSCOPY    . CORNEA LACERATION REPAIR    . CORONARY ARTERY BYPASS GRAFT     X 5 VESSELS  . CYSTOSCOPY N/A 04/06/2013   Procedure: CYSTOSCOPY;  Surgeon: Martina Sinner, MD;  Location: [facility];  Service: Urology;  Laterality: N/A;  . HERNIA REPAIR    . SEPTOPLASTY N/A 04/28/2015   Procedure: SEPTOPLASTY;  Surgeon: Melvenia Beam, MD;  [facility];  Service: ENT;  Laterality: N/A;  . SINUS ENDO W/FUSION Bilateral 04/28/2015   Procedure: ENDOSCOPIC SINUS SURGERY WITH NAVIGATION;  Surgeon: Melvenia Beam, MD;  Location: [facility];  Service: ENT;  Laterality: Bilateral;  . TUBAL LIGATION      Current Outpatient Medications  Medication Sig Dispense Refill  . acetaminophen (TYLENOL) 325 MG tablet Take 2 tablets (650 mg total) by mouth every 6 (six) hours as needed for mild pain, fever or headache (or Fever >/= 101). 12 tablet 2  . albuterol (PROVENTIL) (2.5 MG/3ML) 0.083% nebulizer solution Take 3 mLs (2.5 mg total) by nebulization every 6 (six) hours as needed for wheezing or shortness of breath. 75 mL 12  . albuterol (VENTOLIN HFA) 108 (90 Base) MCG/ACT inhaler Inhale  2 puffs into the lungs every 6 (six) hours as needed for wheezing or shortness of breath. 18 g 1  . ALPRAZolam (XANAX) 0.25 MG tablet Take 1 tablet (0.25 mg total) by mouth 2 (two) times daily as needed for anxiety. 30 tablet 0  . aspirin 81 MG tablet Take 1 tablet (81 mg total) by mouth daily with breakfast. 30 tablet 1  . atorvastatin (LIPITOR) 80 MG tablet Take 1 tablet (80 mg total) by mouth at bedtime. 30 tablet 3  . baclofen (LIORESAL) 10 MG tablet Take 10 mg by mouth daily.    . Biotin 5000 MCG CAPS Take 5,000 mcg by mouth daily.     . cholecalciferol (VITAMIN D) 1000 units tablet Take 1,000 Units by mouth daily.    . famotidine (PEPCID) 20 MG tablet Take 20 mg by mouth daily.    . fluticasone (FLONASE) 50 MCG/ACT nasal spray Place 2 sprays into the nose daily as needed for allergies or rhinitis.     . furosemide (LASIX) 80 MG tablet Take 1 tablet (80 mg total) by mouth daily. 30 tablet 3  . guaiFENesin (MUCINEX) 600 MG 12 hr tablet Take 1 tablet (600 mg total) by mouth 2 (two) times daily. 20 tablet 0  . Hypromellose (ARTIFICIAL TEARS OP) Place 1 drop into both eyes daily as needed (dry eyes).    . isosorbide mononitrate (IMDUR) 30 MG 24 hr tablet Take 30 mg by mouth daily.    [name] levothyroxine (SYNTHROID) 88 MCG tablet Take 88 mcg by mouth daily.    [name] lisinopril (PRINIVIL,ZESTRIL) 20 MG tablet Take 1 tablet (20 mg total) by mouth daily. 30 tablet 0  . montelukast (SINGULAIR) 10 MG tablet Take 10 mg by mouth at bedtime.    . nitroGLYCERIN (NITROSTAT) 0.4 MG SL tablet Place 1 tablet (0.4 mg total) under the tongue every 5 (five) minutes x 3 doses as needed for chest pain (if no relief after 3rd dose, call 911 or proceed to the ED for an evaluation). 25 tablet 2  . potassium chloride (MICRO-K) 10 MEQ CR capsule Take 20 mEq by mouth 2 (two) times daily.     . predniSONE (DELTASONE) 20 MG tablet Take 1 tablet (20 mg total) by mouth daily with breakfast. 5 tablet 0  . umeclidinium-vilanterol  (ANORO ELLIPTA) 62.5-25 MCG/INH AEPB Inhale 1 puff into the lungs daily. 60 each 3   No current facility-administered medications for this visit.   Allergies:  Ivp dye [iodinated diagnostic agents], Betadine [povidone iodine], Codeine, Other, Penicillins, and Sulfa antibiotics   Social History: The patient  reports that she has never smoked. She has never used smokeless tobacco. She reports current alcohol use. She reports that she does not use drugs.   Family History: The patient's family history includes Cirrhosis in her father; Diabetes Mellitus II in her mother; Heart Problems in  her mother; Lung disease in her father.   ROS:  Please see the history of present illness. Otherwise, complete review of systems is positive for none.  All other systems are reviewed and negative.   Physical Exam: VS:  BP 130/80   Temp (!) 97.2 F (36.2 C)   Ht 5\' 3"  (1.6 m)   Wt 143 lb 6.4 oz (65 kg)   SpO2 97%   BMI 25.40 kg/m , BMI Body mass index is 25.4 kg/m.  Wt Readings from Last 3 Encounters:  08/06/19 143 lb 6.4 oz (65 kg)  01/06/19 126 lb 6.4 oz (57.3 kg)  01/03/19 135 lb 12.9 oz (61.6 kg)    General: Patient appears comfortable at rest. Neck: Supple, no elevated JVP or carotid bruits, no thyromegaly. Lungs: Clear to auscultation, nonlabored breathing at rest. Cardiac: Regular rate and rhythm, no S3 or significant systolic murmur, no pericardial rub. Extremities: No pitting edema, distal pulses 2+. Skin: Warm and dry. Musculoskeletal: No kyphosis. Neuropsychiatric: Alert and oriented x3, affect grossly appropriate.  ECG:  An ECG dated 08/06/2019 was personally reviewed today and demonstrated:  Sinus rhythm rate of 72.  No ST or T wave abnormalities, normal axis,  Recent Labwork: 12/31/2018: B Natriuretic Peptide 46.0 01/01/2019: ALT 13; AST 13; BUN 15; Creatinine, Ser 0.72; Hemoglobin 12.3; Platelets 341; Potassium 3.9; Sodium 139     Component Value Date/Time   CHOL 149 04/01/2017 0544    TRIG 147 04/01/2017 0544   HDL 39 (L) 04/01/2017 0544   CHOLHDL 3.8 04/01/2017 0544   VLDL 29 04/01/2017 0544   LDLCALC 81 04/01/2017 0544    Other Studies Reviewed Today:  Echocardiogram 01/01/2019: 1. The left ventricle has normal systolic function with an ejection fraction of 60-65%. The cavity size was normal. There is mild concentric left ventricular hypertrophy. Left ventricular diastolic Doppler parameters are consistent with impaired  relaxation. Indeterminate filling pressures No evidence of left ventricular regional wall motion abnormalities. 2. The right ventricle has normal systolic function. The cavity was normal. There is no increase in right ventricular wall thickness. 3. Left atrial size was moderately dilated. 4. The mitral valve is grossly normal. 5. The tricuspid valve is grossly normal. 6. The aortic valve is tricuspid. 7. The aorta is normal unless otherwise noted.  Chest x-ray 12/31/2018: FINDINGS: The patient is slightly rotated. The cardiomediastinal silhouette is unchanged. Overlying median sternotomy wires are present. The lungs are clear. No focal airspace consolidation or pleural effusion. No acute osseous finding. IMPRESSION: No acute cardiopulmonary process.  Carotid Dopplers 11/06/2016: Summary: Bilateral - 1% to 39% ICA stenosis. Vertebral artery flow is antegrade.  Assessment and Plan:  1. CAD in native artery   2. Dyspnea on exertion   3. Essential hypertension   4. Mixed hyperlipidemia   5. Pain in both lower extremities   6. Medication management   7. Chest pain of uncertain etiology    1. CAD in native artery  / Chest Pain CABG x 5, 2006.  Patient complains of chest pain/chest tightness.  Symptoms are described by her as similar to when she had her bypass surgery back in 2006.  She states she did not have the classic anginal symptoms.  She states her symptoms were more shortness of breath related to exertion which she is  having now.  She does have some chest pain associated with exertion and without exertion.  She denies radiation to arms, back, neck, jaw.States sometimes it can occur while she sitting.  States it  waxes and wanes and does not last for very long.  In fact she was having some transient chest pains while being examined.  Chest pain does not radiate.  She does however state recently she has woken up in a cold sweat with dyspnea and chest pressure.  Get a Lexiscan stress test  2. DOE States she has been noticing increasing dyspnea on exertion along with some lower extremity edema.  She describes this as very similar to symptoms she had before bypass surgery.  Her PCP has been prescribing 120 mg of Lasix daily.  Patient states she cannot tolerate 220 mg due to symptoms of cramping.  She states she reduce the amount to 80 mg daily.  She states her weight is up.  Today she weighs 143 pounds compared to 126 pounds back in August 2020.  She appears normovolemic without any evidence for lower extremity edema.  Lungs are clear to auscultation all fields. She will continue lasix at 80 mg daily.     3. Essential hypertension Recent echocardiogram January 01, 2019 EF 60 to 65% mild concentric left ventricular hypertrophy, LV diastolic parameters consistent with impaired relaxation.  She is normotensive today with a blood pressure of 130/80.  Continue lisinopril 20 mg daily.  Patient states she has been taking up to 120 mg of Lasix daily prescribed by her PCP for leg swelling and shortness of breath/weight gain.  States she decreased the dosage to 80 mg a day due to cramping.  Get basic metabolic panel today.  4. Mixed hyperlipidemia Patient states she recently had some lab work performed at PCP office.  Her PCP is Dr. Manuella Ghazi in [facility].  We will obtain those results.  If lipid labs have not been done we will order a lipid panel.  Continue atorvastatin 80 mg daily.  5. Bilateral leg pain Patient has been  complaining of increasing leg pain which becomes worse with ambulation and relieved with rest.  She had an earlier vascular arterial study scheduled but she believes she canceled it for some reason.  We need to reschedule this to examine for PAD.   Medication Adjustments/Labs and Tests Ordered: Current medicines are reviewed at length with the patient today.  Concerns regarding medicines are outlined above.   Disposition: Follow-up with Dr. Domenic Polite or APP  Signed, Levell July, NP 08/06/2019 2:12 PM     [facility]

## 2019-08-06 ENCOUNTER — Encounter: Payer: Self-pay | Admitting: *Deleted

## 2019-08-06 ENCOUNTER — Encounter: Payer: Self-pay | Admitting: Family Medicine

## 2019-08-06 ENCOUNTER — Ambulatory Visit (INDEPENDENT_AMBULATORY_CARE_PROVIDER_SITE_OTHER): Payer: Medicare Other | Admitting: Family Medicine

## 2019-08-06 ENCOUNTER — Other Ambulatory Visit: Payer: Self-pay

## 2019-08-06 ENCOUNTER — Other Ambulatory Visit (HOSPITAL_COMMUNITY)
Admission: RE | Admit: 2019-08-06 | Discharge: 2019-08-06 | Disposition: A | Discharge status: Home / Self Care | Payer: Medicare Other | Source: Ambulatory Visit | Attending: Family Medicine | Admitting: Family Medicine

## 2019-08-06 VITALS — BP 130/80 | Temp 97.2°F | Ht 63.0 in | Wt 143.4 lb

## 2019-08-06 DIAGNOSIS — E782 Mixed hyperlipidemia: Secondary | ICD-10-CM

## 2019-08-06 DIAGNOSIS — I10 Essential (primary) hypertension: Secondary | ICD-10-CM | POA: Diagnosis not present

## 2019-08-06 DIAGNOSIS — R0600 Dyspnea, unspecified: Secondary | ICD-10-CM

## 2019-08-06 DIAGNOSIS — Z79899 Other long term (current) drug therapy: Secondary | ICD-10-CM

## 2019-08-06 DIAGNOSIS — I2510 Atherosclerotic heart disease of native coronary artery without angina pectoris: Secondary | ICD-10-CM

## 2019-08-06 DIAGNOSIS — M79605 Pain in left leg: Secondary | ICD-10-CM

## 2019-08-06 DIAGNOSIS — M79604 Pain in right leg: Secondary | ICD-10-CM

## 2019-08-06 DIAGNOSIS — R079 Chest pain, unspecified: Secondary | ICD-10-CM

## 2019-08-06 DIAGNOSIS — R0609 Other forms of dyspnea: Secondary | ICD-10-CM

## 2019-08-06 LAB — BASIC METABOLIC PANEL
Anion gap: 6 (ref 5–15)
BUN: 19 mg/dL (ref 8–23)
CO2: 28 mmol/L (ref 22–32)
Calcium: 9.4 mg/dL (ref 8.9–10.3)
Chloride: 105 mmol/L (ref 98–111)
Creatinine, Ser: 0.9 mg/dL (ref 0.44–1.00)
GFR calc Af Amer: >60 mL/min (ref >60)
GFR calc non Af Amer: >60 mL/min (ref >60)
Glucose, Bld: 107 mg/dL — ABNORMAL HIGH (ref 70–99)
Potassium: 3.7 mmol/L (ref 3.5–5.1)
Sodium: 139 mmol/L (ref 135–145)

## 2019-08-06 MED ORDER — NITROGLYCERIN 0.4 MG SL SUBL: 0.4000 mg | SUBLINGUAL_TABLET | SUBLINGUAL | 2 refills | Status: AC | PRN

## 2019-08-06 NOTE — Patient Instructions (Signed)
Medication Instructions:  Your physician recommends that you continue on your current medications as directed. Please refer to the Current Medication list given to you today.  *If you need a refill on your cardiac medications before your next appointment, please call your pharmacy*   Lab Work: Your physician recommends that you return for lab work in: Today   If you have labs (blood work) drawn today and your tests are completely normal, you will receive your results only by: [name] MyChart Message (if you have MyChart) OR . A paper copy in the mail If you have any lab test that is abnormal or we need to change your treatment, we will call you to review the results.   Testing/Procedures: Your physician has requested that you have a lexiscan myoview. For further information please visit [email or web address] Please follow instruction sheet, as given.  Your physician has requested that you have an ankle brachial index (ABI). During this test an ultrasound and blood pressure cuff are used to evaluate the arteries that supply the arms and legs with blood. Allow thirty minutes for this exam. There are no restrictions or special instructions.   Follow-Up: At [facility], you and your health needs are our priority.  As part of our continuing mission to provide you with exceptional heart care, we have created designated Provider Care Teams.  These Care Teams include your primary Cardiologist (physician) and Advanced Practice Providers (APPs -  Physician Assistants and Nurse Practitioners) who all work together to provide you with the care you need, when you need it.  We recommend signing up for the patient portal called "MyChart".  Sign up information is provided on this After Visit Summary.  MyChart is used to connect with patients for Virtual Visits (Telemedicine).  Patients are able to view lab/test results, encounter notes, upcoming appointments, etc.  Non-urgent messages can be sent to your  provider as well.   To learn more about what you can do with MyChart, go to [email or web address].    Your next appointment:   4 week(s)  The format for your next appointment:   In Person  Provider:   You may see [name], MD or one of the following Advanced Practice Providers on your designated Care Team:    Randall An, PA-C   Jacolyn Reedy, PA-C     Other Instructions Thank you for choosing [facility]!

## 2019-08-08 DIAGNOSIS — R0902 Hypoxemia: Secondary | ICD-10-CM | POA: Diagnosis not present

## 2019-08-08 DIAGNOSIS — J441 Chronic obstructive pulmonary disease with (acute) exacerbation: Secondary | ICD-10-CM | POA: Diagnosis not present

## 2019-08-10 ENCOUNTER — Telehealth: Payer: Self-pay | Admitting: *Deleted

## 2019-08-10 NOTE — Telephone Encounter (Signed)
-----   Message from Netta Neat., NP sent at 08/09/2019  9:39 AM EDT ----- Please call patient and let her know that her complete metabolic panel looks good except for her glucose was up a little than 107.  Otherwise everything looked great

## 2019-08-10 NOTE — Telephone Encounter (Signed)
Patient informed. Copy sent to PCP °

## 2019-08-11 ENCOUNTER — Other Ambulatory Visit: Payer: Self-pay

## 2019-08-11 ENCOUNTER — Ambulatory Visit (HOSPITAL_COMMUNITY)
Admission: RE | Admit: 2019-08-11 | Discharge: 2019-08-11 | Disposition: A | Discharge status: Home / Self Care | Payer: Medicare Other | Source: Ambulatory Visit | Attending: Family Medicine | Admitting: Family Medicine

## 2019-08-11 ENCOUNTER — Telehealth: Payer: Self-pay | Admitting: *Deleted

## 2019-08-11 DIAGNOSIS — I70212 Atherosclerosis of native arteries of extremities with intermittent claudication, left leg: Secondary | ICD-10-CM | POA: Diagnosis not present

## 2019-08-11 DIAGNOSIS — M79604 Pain in right leg: Secondary | ICD-10-CM | POA: Insufficient documentation

## 2019-08-11 DIAGNOSIS — M79605 Pain in left leg: Secondary | ICD-10-CM | POA: Diagnosis not present

## 2019-08-11 NOTE — Telephone Encounter (Signed)
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  Please inform her that her right leg looks normal.  Tell her that if the pain in her left leg becomes progressively worse over time with increasing pain when ambulating waiting we may need to do a follow-up test but at this point the issue in her left leg is not significant at this time.  Thank you.

## 2019-08-11 NOTE — Telephone Encounter (Signed)
Pt voiced understanding - routed to pcp  

## 2019-08-25 ENCOUNTER — Encounter (HOSPITAL_COMMUNITY)
Admission: RE | Admit: 2019-08-25 | Discharge: 2019-08-25 | Disposition: A | Discharge status: Home / Self Care | Payer: Medicare Other | Source: Ambulatory Visit | Attending: Family Medicine | Admitting: Family Medicine

## 2019-08-25 ENCOUNTER — Other Ambulatory Visit: Payer: Self-pay

## 2019-08-25 ENCOUNTER — Ambulatory Visit (HOSPITAL_COMMUNITY)
Admission: RE | Admit: 2019-08-25 | Discharge: 2019-08-25 | Disposition: A | Discharge status: Home / Self Care | Payer: Medicare Other | Source: Ambulatory Visit | Attending: Family Medicine | Admitting: Family Medicine

## 2019-08-25 DIAGNOSIS — R0600 Dyspnea, unspecified: Secondary | ICD-10-CM

## 2019-08-25 DIAGNOSIS — R0609 Other forms of dyspnea: Secondary | ICD-10-CM | POA: Insufficient documentation

## 2019-08-25 DIAGNOSIS — R079 Chest pain, unspecified: Secondary | ICD-10-CM | POA: Insufficient documentation

## 2019-08-25 LAB — NM MYOCAR MULTI W/SPECT W/WALL MOTION / EF
LV dias vol: 42 mL (ref 46–106)
LV sys vol: 25 mL
Peak HR: 107 {beats}/min
RATE: 0.33
Rest HR: 75 {beats}/min
SDS: 0
SRS: 6
SSS: 6
TID: 0.96

## 2019-08-25 MED ORDER — TECHNETIUM TC 99M TETROFOSMIN IV KIT
10.0000 | PACK | Freq: Once | INTRAVENOUS | Status: AC | PRN
  Administered 2019-08-25: 11:00:00 10.6 via INTRAVENOUS

## 2019-08-25 MED ORDER — SODIUM CHLORIDE FLUSH 0.9 % IV SOLN
INTRAVENOUS | Status: AC
  Administered 2019-08-25: 12:00:00 10 mL via INTRAVENOUS
  Filled 2019-08-25: qty 10

## 2019-08-25 MED ORDER — REGADENOSON 0.4 MG/5ML IV SOLN
INTRAVENOUS | Status: AC
  Administered 2019-08-25: 12:00:00 0.4 mg via INTRAVENOUS
  Filled 2019-08-25: qty 5

## 2019-08-25 MED ORDER — TECHNETIUM TC 99M TETROFOSMIN IV KIT
30.0000 | PACK | Freq: Once | INTRAVENOUS | Status: AC | PRN
  Administered 2019-08-25: 12:00:00 30.6 via INTRAVENOUS

## 2019-08-27 ENCOUNTER — Telehealth: Payer: Self-pay | Admitting: Cardiology

## 2019-08-27 NOTE — Telephone Encounter (Signed)
Patient called in regards to her recent stress test. Would like to get results.

## 2019-08-27 NOTE — Telephone Encounter (Signed)
-----   Message from Netta Neat., NP sent at 08/25/2019  8:40 PM EDT ----- Please call the patient and tell her the stress test did not show any evidence of ischemia (decreased blood flow through the coronary arteries or bypass vessels) If she has worsening / more frequent symptoms of dyspnea, chest pain / pressure / tightness during exertional effort that is not controlled with her current medication, please call the office.  Thank You

## 2019-08-30 NOTE — Telephone Encounter (Signed)
Patient returned call

## 2019-08-31 NOTE — Telephone Encounter (Signed)
Patient returned call for test results.  Please call # [phone].

## 2019-08-31 NOTE — Telephone Encounter (Signed)
Pt voiced understanding - still c/o chest pain off and on with SOB and "feels like my heart is racing and skipping a beat" but no symptoms have been worse than what she described at recent office appt - she will call us back if symptoms worsen - has f/u with Dr [name] on 4/20 aware that this is in the [place] office

## 2019-08-31 NOTE — Telephone Encounter (Signed)
Busy

## 2019-09-08 DIAGNOSIS — J441 Chronic obstructive pulmonary disease with (acute) exacerbation: Secondary | ICD-10-CM | POA: Diagnosis not present

## 2019-09-08 DIAGNOSIS — R0902 Hypoxemia: Secondary | ICD-10-CM | POA: Diagnosis not present

## 2019-09-17 ENCOUNTER — Encounter: Payer: Self-pay | Admitting: Cardiology

## 2019-09-17 ENCOUNTER — Other Ambulatory Visit: Payer: Self-pay

## 2019-09-17 ENCOUNTER — Ambulatory Visit (INDEPENDENT_AMBULATORY_CARE_PROVIDER_SITE_OTHER): Payer: Medicare Other

## 2019-09-17 ENCOUNTER — Ambulatory Visit (INDEPENDENT_AMBULATORY_CARE_PROVIDER_SITE_OTHER): Payer: Medicare Other | Admitting: Cardiology

## 2019-09-17 VITALS — BP 116/68 | HR 78 | Temp 97.8°F | Ht 63.0 in | Wt 138.0 lb

## 2019-09-17 DIAGNOSIS — E782 Mixed hyperlipidemia: Secondary | ICD-10-CM

## 2019-09-17 DIAGNOSIS — R0602 Shortness of breath: Secondary | ICD-10-CM

## 2019-09-17 DIAGNOSIS — R002 Palpitations: Secondary | ICD-10-CM

## 2019-09-17 DIAGNOSIS — I25119 Atherosclerotic heart disease of native coronary artery with unspecified angina pectoris: Secondary | ICD-10-CM

## 2019-09-17 NOTE — Patient Instructions (Signed)
Medication Instructions: Your physician recommends that you continue on your current medications as directed. Please refer to the Current Medication list given to you today.   Labwork: None today  Procedures/Testing: Your physician has requested that you have an echocardiogram. Echocardiography is a painless test that uses sound waves to create images of your heart. It provides your doctor with information about the size and shape of your heart and how well your heart's chambers and valves are working. This procedure takes approximately one hour. There are no restrictions for this procedure.    Follow-Up:We wil call you with results  Any Additional Special Instructions Will Be Listed Below (If Applicable).     If you need a refill on your cardiac medications before your next appointment, please call your pharmacy.

## 2019-09-17 NOTE — Progress Notes (Signed)
Cardiology Office Note  Date: 09/17/2019   ID: Debbie [name], DOB March 18, 1941, MRN [id number]  PCP:  [name], MD  Cardiologist:  [name], MD Electrophysiologist:  None   Chief Complaint  Patient presents with  . Cardiac follow-up    History of Present Illness: Debbie [name] is a 79 y.o. female last seen by Mr. Vincenza Hews NP in March.  She presents for a follow-up visit.  We discussed her symptoms.  She describes intermittent episodes of palpitations and shortness of breath, these tend to be sporadic, she woke up 1 morning with the symptoms, other times has experienced it during the daytime.  She does not report any active angina.  No orthopnea or PND.  Follow-up Lexiscan Myoview ordered by Mr. Vincenza Hews NP in March showed no significant perfusion defects but a calculated ejection fraction of 41%.  Her last echocardiogram from August 2020 EF of 60 to 65%.  She has been continued on medical therapy.  I reviewed her medications which are outlined below and stable from a cardiac perspective.  Past Medical History:  Diagnosis Date  . Allergic rhinitis   . Anxiety   . Arthritis   . Asthma   . Bladder spasms   . CAD (coronary artery disease)    CABG 2006 [facility]  . Carotid artery disease (HCC)   . Collagen vascular disease (HCC)   . COPD (chronic obstructive pulmonary disease) (HCC)   . Cystocele   . Essential hypertension   . GERD (gastroesophageal reflux disease)   . Hyperlipidemia   . Hypothyroidism   . Osteoporosis   . Renal insufficiency   . Vaginal vault prolapse     Past Surgical History:  Procedure Laterality Date  . ANTERIOR AND POSTERIOR REPAIR N/A 04/06/2013   Procedure: CYSTOCELE REPAIR ;  Surgeon: Martina Sinner, MD;  Location: [facility] ORS;  Service: Urology;  Laterality: N/A;  . CATARACTS     REMOVED  . COLONOSCOPY    . CORNEA LACERATION REPAIR    . CORONARY ARTERY BYPASS GRAFT     X 5 VESSELS  . CYSTOSCOPY N/A 04/06/2013   Procedure: CYSTOSCOPY;  Surgeon: Martina Sinner, MD;  Location: [facility] ORS;  Service: Urology;  Laterality: N/A;  . HERNIA REPAIR    . SEPTOPLASTY N/A 04/28/2015   Procedure: SEPTOPLASTY;  Surgeon: Melvenia Beam, MD;  Location: [facility];  Service: ENT;  Laterality: N/A;  . SINUS ENDO W/FUSION Bilateral 04/28/2015   Procedure: ENDOSCOPIC SINUS SURGERY WITH NAVIGATION;  Surgeon: Melvenia Beam, MD;  Location[facility];  Service: ENT;  Laterality: Bilateral;  . TUBAL LIGATION      Current Outpatient Medications  Medication Sig Dispense Refill  . acetaminophen (TYLENOL) 325 MG tablet Take 2 tablets (650 mg total) by mouth every 6 (six) hours as needed for mild pain, fever or headache (or Fever >/= 101). 12 tablet 2  . albuterol (PROVENTIL) (2.5 MG/3ML) 0.083% nebulizer solution Take 3 mLs (2.5 mg total) by nebulization every 6 (six) hours as needed for wheezing or shortness of breath. 75 mL 12  . albuterol (VENTOLIN HFA) 108 (90 Base) MCG/ACT inhaler Inhale 2 puffs into the lungs every 6 (six) hours as needed for wheezing or shortness of breath. 18 g 1  . ALPRAZolam (XANAX) 0.25 MG tablet Take 1 tablet (0.25 mg total) by mouth 2 (two) times daily as needed for anxiety. 30 tablet 0  . aspirin 81 MG tablet Take 1 tablet (81 mg total) by mouth daily  with breakfast. 30 tablet 1  . atorvastatin (LIPITOR) 80 MG tablet Take 1 tablet (80 mg total) by mouth at bedtime. 30 tablet 3  . baclofen (LIORESAL) 10 MG tablet Take 10 mg by mouth daily.    . Biotin 5000 MCG CAPS Take 5,000 mcg by mouth daily.     . cholecalciferol (VITAMIN D) 1000 units tablet Take 1,000 Units by mouth daily.    . famotidine (PEPCID) 20 MG tablet Take 20 mg by mouth daily.    . fluticasone (FLONASE) 50 MCG/ACT nasal spray Place 2 sprays into the nose daily as needed for allergies or rhinitis.     . furosemide (LASIX) 80 MG tablet Take 1 tablet (80 mg total) by mouth daily. 30 tablet 3  . guaiFENesin (MUCINEX) 600 MG 12 hr tablet Take 1 tablet  (600 mg total) by mouth 2 (two) times daily. 20 tablet 0  . Hypromellose (ARTIFICIAL TEARS OP) Place 1 drop into both eyes daily as needed (dry eyes).    . isosorbide mononitrate (IMDUR) 30 MG 24 hr tablet Take 30 mg by mouth daily.    [name] levothyroxine (SYNTHROID) 88 MCG tablet Take 88 mcg by mouth daily.    [name] lisinopril (PRINIVIL,ZESTRIL) 20 MG tablet Take 1 tablet (20 mg total) by mouth daily. 30 tablet 0  . montelukast (SINGULAIR) 10 MG tablet Take 10 mg by mouth at bedtime.    . nitroGLYCERIN (NITROSTAT) 0.4 MG SL tablet Place 1 tablet (0.4 mg total) under the tongue every 5 (five) minutes x 3 doses as needed for chest pain (if no relief after 3rd dose, call 911 or proceed to the ED for an evaluation). 25 tablet 2  . potassium chloride (MICRO-K) 10 MEQ CR capsule Take 20 mEq by mouth 2 (two) times daily.     . predniSONE (DELTASONE) 20 MG tablet Take 1 tablet (20 mg total) by mouth daily with breakfast. 5 tablet 0  . umeclidinium-vilanterol (ANORO ELLIPTA) 62.5-25 MCG/INH AEPB Inhale 1 puff into the lungs daily. 60 each 3   No current facility-administered medications for this visit.   Allergies:  Ivp dye [iodinated diagnostic agents], Betadine [povidone iodine], Codeine, Other, Penicillins, and Sulfa antibiotics   ROS:  No recent wheezing, no syncope.  Physical Exam: VS:  BP 116/68   Pulse 78   Temp 97.8 F (36.6 C)   Ht 5\' 3"  (1.6 m)   Wt 138 lb (62.6 kg)   SpO2 96%   BMI 24.45 kg/m , BMI Body mass index is 24.45 kg/m.  Wt Readings from Last 3 Encounters:  09/17/19 138 lb (62.6 kg)  08/06/19 143 lb 6.4 oz (65 kg)  01/06/19 126 lb 6.4 oz (57.3 kg)    General: Elderly woman, appears comfortable at rest. HEENT: Conjunctiva and lids normal, wearing a mask. Neck: Supple, no elevated JVP or carotid bruits, no thyromegaly. Lungs: Decreased breath sounds, nonlabored breathing at rest. Cardiac: Regular rate and rhythm, no S3, soft systolic murmur. Abdomen: Soft, nontender, bowel  sounds present. Extremities: No pitting edema, distal pulses 2+.  ECG:  An ECG dated 08/06/2019 was personally reviewed today and demonstrated:  Normal sinus rhythm.  Nonspecific ST changes.  Recent Labwork: 12/31/2018: B Natriuretic Peptide 46.0 01/01/2019: ALT 13; AST 13; Hemoglobin 12.3; Platelets 341 08/06/2019: BUN 19; Creatinine, Ser 0.90; Potassium 3.7; Sodium 139     Component Value Date/Time   CHOL 149 04/01/2017 0544   TRIG 147 04/01/2017 0544   HDL 39 (L) 04/01/2017 0544   CHOLHDL  3.8 04/01/2017 0544   VLDL 29 04/01/2017 0544   LDLCALC 81 04/01/2017 0544    Other Studies Reviewed Today:  Echocardiogram 01/01/2019: 1. The left ventricle has normal systolic function with an ejection  fraction of 60-65%. The cavity size was normal. There is mild concentric  left ventricular hypertrophy. Left ventricular diastolic Doppler  parameters are consistent with impaired  relaxation. Indeterminate filling pressures No evidence of left  ventricular regional wall motion abnormalities.  2. The right ventricle has normal systolic function. The cavity was  normal. There is no increase in right ventricular wall thickness.  3. Left atrial size was moderately dilated.  4. The mitral valve is grossly normal.  5. The tricuspid valve is grossly normal.  6. The aortic valve is tricuspid.  7. The aorta is normal unless otherwise noted.   Lexiscan Myoview 08/25/2019:  There was no ST segment deviation noted during stress.  The study is normal. There are no perfusion defects  The left ventricular ejection fraction is mildly decreased (41%).  Intermediate risk based solely on calculated LVEF. Visually LV function appears normal, consider correlating with echocardiogram. There is no significant ischemia.  Assessment and Plan:  1.  Intermittent episodes of palpitations and shortness of breath, no associated syncope, no definite progression in angina symptoms.  Recent Myoview showed normal  perfusion but reduced EF at 41% although her prior LVEF had been normal as of August of last year.  Plan is to obtain a 7-day Zio patch to investigate for paroxysmal arrhythmia, also echocardiogram to reassess cardiac structure and function.  2.  CAD status post CABG in 2006.  Plan is to continue medical therapy for now, recent Myoview reviewed.  Further recommendations pending above testing.  3.  Mixed hyperlipidemia on Lipitor.  Medication Adjustments/Labs and Tests Ordered: Current medicines are reviewed at length with the patient today.  Concerns regarding medicines are outlined above.   Tests Ordered: Orders Placed This Encounter  Procedures  . Cardiac event monitor  . ECHOCARDIOGRAM COMPLETE    Medication Changes: No orders of the defined types were placed in this encounter.   Disposition:  Follow up test results and determine next step.  Signed, Satira Sark, MD, [facility] 09/17/2019 1:47 PM    [facility] at [facility] [address], [place], [place] [address] Phone: [phone]; Fax: [phone]

## 2019-09-27 ENCOUNTER — Ambulatory Visit (HOSPITAL_COMMUNITY): Admission: RE | Admit: 2019-09-27 | Payer: Medicare HMO | Source: Ambulatory Visit

## 2019-10-07 ENCOUNTER — Other Ambulatory Visit: Payer: Self-pay

## 2019-10-07 ENCOUNTER — Ambulatory Visit (HOSPITAL_COMMUNITY)
Admission: RE | Admit: 2019-10-07 | Discharge: 2019-10-07 | Disposition: A | Discharge status: Home / Self Care | Payer: Medicare HMO | Source: Ambulatory Visit | Attending: Cardiology | Admitting: Cardiology

## 2019-10-07 DIAGNOSIS — R0602 Shortness of breath: Secondary | ICD-10-CM | POA: Insufficient documentation

## 2019-10-07 NOTE — Progress Notes (Signed)
*  PRELIMINARY RESULTS* Echocardiogram 2D Echocardiogram has been performed.  Stacey Drain 10/07/2019, 10:24 AM

## 2019-10-08 DIAGNOSIS — J441 Chronic obstructive pulmonary disease with (acute) exacerbation: Secondary | ICD-10-CM | POA: Diagnosis not present

## 2019-10-08 DIAGNOSIS — R0902 Hypoxemia: Secondary | ICD-10-CM | POA: Diagnosis not present

## 2019-10-08 DIAGNOSIS — R002 Palpitations: Secondary | ICD-10-CM | POA: Diagnosis not present

## 2019-10-12 ENCOUNTER — Other Ambulatory Visit: Payer: Self-pay

## 2019-10-15 ENCOUNTER — Telehealth: Payer: Self-pay

## 2019-10-15 NOTE — Telephone Encounter (Signed)
I called patient back in less than 20  minutes and phone just rang again,no answer,no machine.

## 2019-10-15 NOTE — Telephone Encounter (Signed)
Pt would like to know ECHO results   Please call [phone]  Thanks renee

## 2019-10-18 DIAGNOSIS — E7800 Pure hypercholesterolemia, unspecified: Secondary | ICD-10-CM | POA: Diagnosis not present

## 2019-10-18 DIAGNOSIS — I10 Essential (primary) hypertension: Secondary | ICD-10-CM | POA: Diagnosis not present

## 2019-10-19 ENCOUNTER — Telehealth: Payer: Self-pay

## 2019-10-19 MED ORDER — BISOPROLOL FUMARATE 5 MG PO TABS: 2.5000 mg | ORAL_TABLET | Freq: Every day | ORAL | 3 refills | Status: DC

## 2019-10-19 NOTE — Telephone Encounter (Signed)
Results give, e-scribed bisoprolol 2.5 mg to [facility], pt has 3 month apt already

## 2019-10-19 NOTE — Telephone Encounter (Signed)
-----   Message from Jonelle Sidle, MD sent at 10/12/2019 12:43 PM EDT ----- Results reviewed.  Heart monitor showed overall normal rhythm with some episodes of brief SVT which she could be feeling intermittently as episodic palpitations and shortness of breath.  Let's try her on low-dose beta-blocker in the form of bisoprolol 2.5 mg daily.  Schedule follow-up of the next 3 months.

## 2019-11-04 ENCOUNTER — Telehealth: Payer: Self-pay | Admitting: Cardiology

## 2019-11-04 NOTE — Telephone Encounter (Signed)
Pt voiced understanding - says she checked BP again and was 130/78 HR 50 - will decrease lisinopril 10 mg daily and reminded to write down BP/HR at least 1 hour after taking medications and after sitting 10-15 mins - pt will continue to monitor

## 2019-11-04 NOTE — Telephone Encounter (Signed)
Pt c/o being weak since starting 2 days ago BP 111/78 this morning - says last 2 days SBP has been 60s-70s - has not taken bisoprolol this morning - doesn't know what HR has been

## 2019-11-04 NOTE — Telephone Encounter (Signed)
     Covering for Dr. [name] - I doubt the accuracy of the 69/79 reading as her diastolic reading cannot be higher than her systolic. Nonetheless, if she is feeling weak and BP has been soft, we likely need to adjust her medications. She is on the lowest dose of Bisoprolol at 2.5mg  daily (would make sure she is actually cutting the tablet in half as it is prescribed as a 5mg  tablet). She is also on Imdur and Lisinopril which impact BP. Given her palpitations, would try to continue low-dose Bisoprolol. I would recommend she cut her Lisinopril in half and only take 10mg  daily. Continue to follow HR and BP as we might have to reduce Lisinopril further pending her trend.   Signed, , PA-C 11/04/2019, 9:42 AM Pager: [phone]

## 2019-11-04 NOTE — Telephone Encounter (Signed)
Pt stated that since starting a new medication that Dr. [name] put her on it's making her BP run to low.   The reading this morning was 111/78 and last night it was 69/79  Pt is not sure what the name of the medication is

## 2019-11-08 DIAGNOSIS — J441 Chronic obstructive pulmonary disease with (acute) exacerbation: Secondary | ICD-10-CM | POA: Diagnosis not present

## 2019-11-08 DIAGNOSIS — R0902 Hypoxemia: Secondary | ICD-10-CM | POA: Diagnosis not present

## 2019-11-16 ENCOUNTER — Other Ambulatory Visit: Payer: Self-pay | Admitting: *Deleted

## 2019-11-16 ENCOUNTER — Telehealth: Payer: Self-pay | Admitting: Cardiology

## 2019-11-16 MED ORDER — LISINOPRIL 10 MG PO TABS: 10.0000 mg | ORAL_TABLET | Freq: Every day | ORAL | 1 refills | Status: DC

## 2019-11-16 MED ORDER — ISOSORBIDE MONONITRATE ER 30 MG PO TB24: 30.0000 mg | ORAL_TABLET | Freq: Every day | ORAL | 2 refills | Status: DC

## 2019-11-16 NOTE — Telephone Encounter (Signed)
Myra @ [facility] is needing a new Rx for the pt's isosorbide mononitrate (IMDUR) 30 MG 24 hr tablet [[id number]]  With the new instructions

## 2019-11-17 ENCOUNTER — Telehealth: Payer: Self-pay | Admitting: Cardiology

## 2019-11-17 DIAGNOSIS — E785 Hyperlipidemia, unspecified: Secondary | ICD-10-CM | POA: Diagnosis not present

## 2019-11-17 DIAGNOSIS — J449 Chronic obstructive pulmonary disease, unspecified: Secondary | ICD-10-CM | POA: Diagnosis not present

## 2019-11-17 NOTE — Telephone Encounter (Signed)
Please give pt a call concerning medication  °

## 2019-11-18 NOTE — Telephone Encounter (Signed)
Spoke with patient and explained to her that the only change on 11/04/19 was to decrease lisinopril to 10 mg daily. Advised that she was asked to break her 20 mg lisinopril tablet in half daily. Verbalized understanding.

## 2019-11-30 DIAGNOSIS — M9901 Segmental and somatic dysfunction of cervical region: Secondary | ICD-10-CM | POA: Diagnosis not present

## 2019-11-30 DIAGNOSIS — M47812 Spondylosis without myelopathy or radiculopathy, cervical region: Secondary | ICD-10-CM | POA: Diagnosis not present

## 2019-11-30 DIAGNOSIS — M531 Cervicobrachial syndrome: Secondary | ICD-10-CM | POA: Diagnosis not present

## 2019-11-30 DIAGNOSIS — M9902 Segmental and somatic dysfunction of thoracic region: Secondary | ICD-10-CM | POA: Diagnosis not present

## 2019-11-30 DIAGNOSIS — M4004 Postural kyphosis, thoracic region: Secondary | ICD-10-CM | POA: Diagnosis not present

## 2019-12-01 DIAGNOSIS — M47812 Spondylosis without myelopathy or radiculopathy, cervical region: Secondary | ICD-10-CM | POA: Diagnosis not present

## 2019-12-01 DIAGNOSIS — M9902 Segmental and somatic dysfunction of thoracic region: Secondary | ICD-10-CM | POA: Diagnosis not present

## 2019-12-01 DIAGNOSIS — M4004 Postural kyphosis, thoracic region: Secondary | ICD-10-CM | POA: Diagnosis not present

## 2019-12-01 DIAGNOSIS — M9901 Segmental and somatic dysfunction of cervical region: Secondary | ICD-10-CM | POA: Diagnosis not present

## 2019-12-01 DIAGNOSIS — M531 Cervicobrachial syndrome: Secondary | ICD-10-CM | POA: Diagnosis not present

## 2019-12-03 DIAGNOSIS — M4004 Postural kyphosis, thoracic region: Secondary | ICD-10-CM | POA: Diagnosis not present

## 2019-12-03 DIAGNOSIS — M9902 Segmental and somatic dysfunction of thoracic region: Secondary | ICD-10-CM | POA: Diagnosis not present

## 2019-12-03 DIAGNOSIS — M9901 Segmental and somatic dysfunction of cervical region: Secondary | ICD-10-CM | POA: Diagnosis not present

## 2019-12-03 DIAGNOSIS — M47812 Spondylosis without myelopathy or radiculopathy, cervical region: Secondary | ICD-10-CM | POA: Diagnosis not present

## 2019-12-03 DIAGNOSIS — M531 Cervicobrachial syndrome: Secondary | ICD-10-CM | POA: Diagnosis not present

## 2019-12-08 DIAGNOSIS — R0902 Hypoxemia: Secondary | ICD-10-CM | POA: Diagnosis not present

## 2019-12-08 DIAGNOSIS — J441 Chronic obstructive pulmonary disease with (acute) exacerbation: Secondary | ICD-10-CM | POA: Diagnosis not present

## 2019-12-22 DIAGNOSIS — E039 Hypothyroidism, unspecified: Secondary | ICD-10-CM | POA: Diagnosis not present

## 2019-12-22 DIAGNOSIS — I10 Essential (primary) hypertension: Secondary | ICD-10-CM | POA: Diagnosis not present

## 2019-12-22 DIAGNOSIS — I509 Heart failure, unspecified: Secondary | ICD-10-CM | POA: Diagnosis not present

## 2019-12-22 DIAGNOSIS — E785 Hyperlipidemia, unspecified: Secondary | ICD-10-CM | POA: Diagnosis not present

## 2019-12-22 DIAGNOSIS — E119 Type 2 diabetes mellitus without complications: Secondary | ICD-10-CM | POA: Diagnosis not present

## 2019-12-22 DIAGNOSIS — I25810 Atherosclerosis of coronary artery bypass graft(s) without angina pectoris: Secondary | ICD-10-CM | POA: Diagnosis not present

## 2019-12-22 DIAGNOSIS — F3340 Major depressive disorder, recurrent, in remission, unspecified: Secondary | ICD-10-CM | POA: Diagnosis not present

## 2019-12-22 DIAGNOSIS — Z7689 Persons encountering health services in other specified circumstances: Secondary | ICD-10-CM | POA: Diagnosis not present

## 2019-12-22 DIAGNOSIS — J449 Chronic obstructive pulmonary disease, unspecified: Secondary | ICD-10-CM | POA: Diagnosis not present

## 2019-12-22 DIAGNOSIS — K449 Diaphragmatic hernia without obstruction or gangrene: Secondary | ICD-10-CM | POA: Diagnosis not present

## 2019-12-22 DIAGNOSIS — Z6824 Body mass index (BMI) 24.0-24.9, adult: Secondary | ICD-10-CM | POA: Diagnosis not present

## 2020-01-08 DIAGNOSIS — R0902 Hypoxemia: Secondary | ICD-10-CM | POA: Diagnosis not present

## 2020-01-08 DIAGNOSIS — J441 Chronic obstructive pulmonary disease with (acute) exacerbation: Secondary | ICD-10-CM | POA: Diagnosis not present

## 2020-01-11 DIAGNOSIS — J449 Chronic obstructive pulmonary disease, unspecified: Secondary | ICD-10-CM | POA: Diagnosis not present

## 2020-01-11 DIAGNOSIS — I509 Heart failure, unspecified: Secondary | ICD-10-CM | POA: Diagnosis not present

## 2020-01-11 DIAGNOSIS — R0602 Shortness of breath: Secondary | ICD-10-CM | POA: Diagnosis not present

## 2020-01-14 DIAGNOSIS — I509 Heart failure, unspecified: Secondary | ICD-10-CM | POA: Diagnosis not present

## 2020-01-14 DIAGNOSIS — R0602 Shortness of breath: Secondary | ICD-10-CM | POA: Diagnosis not present

## 2020-01-14 DIAGNOSIS — Z794 Long term (current) use of insulin: Secondary | ICD-10-CM | POA: Diagnosis not present

## 2020-01-14 DIAGNOSIS — Z1389 Encounter for screening for other disorder: Secondary | ICD-10-CM | POA: Diagnosis not present

## 2020-01-14 DIAGNOSIS — J449 Chronic obstructive pulmonary disease, unspecified: Secondary | ICD-10-CM | POA: Diagnosis not present

## 2020-01-14 DIAGNOSIS — Z1331 Encounter for screening for depression: Secondary | ICD-10-CM | POA: Diagnosis not present

## 2020-01-14 DIAGNOSIS — E1165 Type 2 diabetes mellitus with hyperglycemia: Secondary | ICD-10-CM | POA: Diagnosis not present

## 2020-01-14 DIAGNOSIS — Z6824 Body mass index (BMI) 24.0-24.9, adult: Secondary | ICD-10-CM | POA: Diagnosis not present

## 2020-01-20 ENCOUNTER — Other Ambulatory Visit: Payer: Self-pay

## 2020-01-20 ENCOUNTER — Ambulatory Visit (INDEPENDENT_AMBULATORY_CARE_PROVIDER_SITE_OTHER): Payer: Medicare Other | Admitting: Cardiology

## 2020-01-20 ENCOUNTER — Encounter: Payer: Self-pay | Admitting: *Deleted

## 2020-01-20 ENCOUNTER — Other Ambulatory Visit (HOSPITAL_COMMUNITY): Payer: Self-pay | Admitting: Radiology

## 2020-01-20 ENCOUNTER — Encounter: Payer: Self-pay | Admitting: Cardiology

## 2020-01-20 VITALS — BP 112/60 | HR 50 | Ht 63.5 in | Wt 136.0 lb

## 2020-01-20 DIAGNOSIS — I471 Supraventricular tachycardia, unspecified: Secondary | ICD-10-CM

## 2020-01-20 DIAGNOSIS — R0602 Shortness of breath: Secondary | ICD-10-CM

## 2020-01-20 DIAGNOSIS — I25119 Atherosclerotic heart disease of native coronary artery with unspecified angina pectoris: Secondary | ICD-10-CM | POA: Diagnosis not present

## 2020-01-20 DIAGNOSIS — E782 Mixed hyperlipidemia: Secondary | ICD-10-CM | POA: Diagnosis not present

## 2020-01-20 NOTE — Patient Instructions (Addendum)
Medication Instructions:   Your physician recommends that you continue on your current medications as directed. Please refer to the Current Medication list given to you today.  Call office with name of new medication  Labwork:  None  Testing/Procedures:  None  Follow-Up:  Your physician recommends that you schedule a follow-up appointment in: 6 months.   Any Other Special Instructions Will Be Listed Below (If Applicable).  If you need a refill on your cardiac medications before your next appointment, please call your pharmacy.

## 2020-01-20 NOTE — Progress Notes (Signed)
Cardiology Office Note  Date: 01/20/2020   ID: Debbie [name], DOB [birth year], MRN [id number]  PCP:  [facility]  Cardiologist:  [name], MD Electrophysiologist:  None   Chief Complaint  Patient presents with  . Cardiac follow-up    History of Present Illness: Debbie [name] is a 79 y.o. female last seen in April.  She presents for a routine visit.  Overall, doing about the same in terms of functional capacity.  Still enjoys getting out in her yard but has to pace herself.  She does not describe any active angina at this time, no palpitations or syncope.  Follow-up echocardiogram and cardiac monitor done in the interim are outlined below.  She was started on low-dose bisoprolol for treatment of brief episodes of SVT and intermittent palpitations/shortness of breath.  She reports having recent lab work with [facility], we are requesting the results.  I went over her medications which are outlined below.  Past Medical History:  Diagnosis Date  . Allergic rhinitis   . Anxiety   . Arthritis   . Asthma   . Bladder spasms   . CAD (coronary artery disease)    CABG 2006 [facility]  . Carotid artery disease (HCC)   . Collagen vascular disease (HCC)   . COPD (chronic obstructive pulmonary disease) (HCC)   . Cystocele   . Essential hypertension   . GERD (gastroesophageal reflux disease)   . Hyperlipidemia   . Hypothyroidism   . Osteoporosis   . Renal insufficiency   . Vaginal vault prolapse     Past Surgical History:  Procedure Laterality Date  . ANTERIOR AND POSTERIOR REPAIR N/A 04/06/2013   Procedure: CYSTOCELE REPAIR ;  Surgeon: Martina Sinner, MD;  Location: [facility];  Service: Urology;  Laterality: N/A;  . CATARACTS     REMOVED  . COLONOSCOPY    . CORNEA LACERATION REPAIR    . CORONARY ARTERY BYPASS GRAFT     X 5 VESSELS  . CYSTOSCOPY N/A 04/06/2013   Procedure: CYSTOSCOPY;  Surgeon: Martina Sinner, MD;  Location: [facility];   Service: Urology;  Laterality: N/A;  . HERNIA REPAIR    . SEPTOPLASTY N/A 04/28/2015   Procedure: SEPTOPLASTY;  Surgeon: Melvenia Beam, MD;  Location: [facility] OR;  Service: ENT;  Laterality: N/A;  . SINUS ENDO W/FUSION Bilateral 04/28/2015   Procedure: ENDOSCOPIC SINUS SURGERY WITH NAVIGATION;  Surgeon: Melvenia Beam, MD;  [facility];  Service: ENT;  Laterality: Bilateral;  . TUBAL LIGATION      Current Outpatient Medications  Medication Sig Dispense Refill  . acetaminophen (TYLENOL) 325 MG tablet Take 2 tablets (650 mg total) by mouth every 6 (six) hours as needed for mild pain, fever or headache (or Fever >/= 101). 12 tablet 2  . albuterol (PROVENTIL) (2.5 MG/3ML) 0.083% nebulizer solution Take 3 mLs (2.5 mg total) by nebulization every 6 (six) hours as needed for wheezing or shortness of breath. 75 mL 12  . albuterol (VENTOLIN HFA) 108 (90 Base) MCG/ACT inhaler Inhale 2 puffs into the lungs every 6 (six) hours as needed for wheezing or shortness of breath. 18 g 1  . ALPRAZolam (XANAX) 0.25 MG tablet Take 1 tablet (0.25 mg total) by mouth 2 (two) times daily as needed for anxiety. 30 tablet 0  . aspirin 81 MG tablet Take 1 tablet (81 mg total) by mouth daily with breakfast. 30 tablet 1  . atorvastatin (LIPITOR) 80 MG tablet Take 1 tablet (80  mg total) by mouth at bedtime. 30 tablet 3  . baclofen (LIORESAL) 10 MG tablet Take 10 mg by mouth daily.    . Biotin 5000 MCG CAPS Take 5,000 mcg by mouth daily.     . bisoprolol (ZEBETA) 5 MG tablet Take 0.5 tablets (2.5 mg total) by mouth daily. 45 tablet 3  . cholecalciferol (VITAMIN D) 1000 units tablet Take 1,000 Units by mouth daily.    . famotidine (PEPCID) 20 MG tablet Take 20 mg by mouth daily.    . fluticasone (FLONASE) 50 MCG/ACT nasal spray Place 2 sprays into the nose daily as needed for allergies or rhinitis.     . furosemide (LASIX) 80 MG tablet Take 1 tablet (80 mg total) by mouth daily. 30 tablet 3  . guaiFENesin (MUCINEX) 600 MG 12 hr  tablet Take 1 tablet (600 mg total) by mouth 2 (two) times daily. 20 tablet 0  . Hypromellose (ARTIFICIAL TEARS OP) Place 1 drop into both eyes daily as needed (dry eyes).    . isosorbide mononitrate (IMDUR) 30 MG 24 hr tablet Take 1 tablet (30 mg total) by mouth daily. 90 tablet 2  . levothyroxine (SYNTHROID) 88 MCG tablet Take 88 mcg by mouth daily.    [name] lisinopril (ZESTRIL) 10 MG tablet Take 1 tablet (10 mg total) by mouth daily. 90 tablet 1  . montelukast (SINGULAIR) 10 MG tablet Take 10 mg by mouth at bedtime.    . nitroGLYCERIN (NITROSTAT) 0.4 MG SL tablet Place 1 tablet (0.4 mg total) under the tongue every 5 (five) minutes x 3 doses as needed for chest pain (if no relief after 3rd dose, call 911 or proceed to the ED for an evaluation). 25 tablet 2  . potassium chloride (MICRO-K) 10 MEQ CR capsule Take 20 mEq by mouth 2 (two) times daily.     . predniSONE (DELTASONE) 20 MG tablet Take 1 tablet (20 mg total) by mouth daily with breakfast. (Patient taking differently: Take 20 mg by mouth daily with breakfast. Does note take daily) 5 tablet 0  . umeclidinium-vilanterol (ANORO ELLIPTA) 62.5-25 MCG/INH AEPB Inhale 1 puff into the lungs daily. 60 each 3   No current facility-administered medications for this visit.   Allergies:  Ivp dye [iodinated diagnostic agents], Betadine [povidone iodine], Codeine, Other, Penicillins, and Sulfa antibiotics   ROS:   No orthopnea or PND.  Physical Exam: VS:  BP 112/60   Pulse (!) 50   Ht 5' 3.5" (1.613 m)   Wt 136 lb (61.7 kg)   BMI 23.71 kg/m , BMI Body mass index is 23.71 kg/m.  Wt Readings from Last 3 Encounters:  01/20/20 136 lb (61.7 kg)  09/17/19 138 lb (62.6 kg)  08/06/19 143 lb 6.4 oz (65 kg)    General: Elderly woman, appears comfortable at rest. HEENT: Conjunctiva and lids normal, wearing a mask. Neck: Supple, no elevated JVP or carotid bruits, no thyromegaly. Lungs: Decreased breath sounds, nonlabored breathing at rest. Cardiac:  Regular rate and rhythm, no S3, soft systolic murmur, no pericardial rub. Extremities: No pitting edema, distal pulses 2+.  ECG:  An ECG dated 08/06/2019 was personally reviewed today and demonstrated:  Normal sinus rhythm with nonspecific ST changes.  Recent Labwork: 08/06/2019: BUN 19; Creatinine, Ser 0.90; Potassium 3.7; Sodium 139     Component Value Date/Time   CHOL 149 04/01/2017 0544   TRIG 147 04/01/2017 0544   HDL 39 (L) 04/01/2017 0544   CHOLHDL 3.8 04/01/2017 0544   VLDL 29  04/01/2017 0544   LDLCALC 81 04/01/2017 0544    Other Studies Reviewed Today:  Lexiscan Myoview 08/25/2019:  There was no ST segment deviation noted during stress.  The study is normal. There are no perfusion defects  The left ventricular ejection fraction is mildly decreased (41%).  Intermediate risk based solely on calculated LVEF. Visually LV function appears normal, consider correlating with echocardiogram. There is no significant ischemia.  Echocardiogram 10/07/2019: 1. Left ventricular ejection fraction, by estimation, is 65 to 70%. The  left ventricle has normal function. The left ventricle has no regional  wall motion abnormalities. There is mild left ventricular hypertrophy.  Left ventricular diastolic parameters  were normal.  2. Right ventricular systolic function is normal. The right ventricular  size is normal. There is normal pulmonary artery systolic pressure.  3. Left atrial size was mildly dilated.  4. The mitral valve is normal in structure. Trivial mitral valve  regurgitation. No evidence of mitral stenosis.  5. The aortic valve is tricuspid. Aortic valve regurgitation is not  visualized. No aortic stenosis is present.  6. The inferior vena cava is normal in size with greater than 50%  respiratory variability, suggesting right atrial pressure of 3 mmHg.   Cardiac monitor May 2021: ZIO XT reviewed.  6 days 8 hours analyzed.  Predominant rhythm is sinus with heart rate  ranging from 45 bpm up to 117 bpm and average heart rate 71 bpm.  Rare PACs and PVCs were noted representing less than 1% of total beats.  There were brief episodes of SVT, the longest of which lasted 8.7 seconds.  There were no sustained arrhythmias or pauses.  Assessment and Plan:  1.  CAD status post CABG in 2006.  She does not describe any active angina at this time.  Myoview in April did not demonstrate any active ischemia and echocardiogram shows vigorous LVEF at 65 to 70%.  Plan to continue medical therapy and observation.  She is on aspirin, Imdur, lisinopril, and high-dose Lipitor.  2.  History of brief episodes of SVT.  Started on low-dose bisoprolol.  No active palpitations at this time.  3.  Mixed hyperlipidemia on high-dose Lipitor.  Last LDL was 81 from prior PCP lab work.  Requesting recent lab work from [employer].  Medication Adjustments/Labs and Tests Ordered: Current medicines are reviewed at length with the patient today.  Concerns regarding medicines are outlined above.   Tests Ordered: No orders of the defined types were placed in this encounter.   Medication Changes: No orders of the defined types were placed in this encounter.   Disposition:  Follow up 6 months in the [place] office.  Signed, Jonelle Sidle, MD, [facility] 01/20/2020 4:24 PM    [facility] [address] [place], [place], [place] [address] Phone: [phone]; Fax: [phone]

## 2020-02-17 DIAGNOSIS — J449 Chronic obstructive pulmonary disease, unspecified: Secondary | ICD-10-CM | POA: Diagnosis not present

## 2020-02-17 DIAGNOSIS — E785 Hyperlipidemia, unspecified: Secondary | ICD-10-CM | POA: Diagnosis not present

## 2020-03-01 DIAGNOSIS — Z7689 Persons encountering health services in other specified circumstances: Secondary | ICD-10-CM | POA: Diagnosis not present

## 2020-03-01 DIAGNOSIS — E1165 Type 2 diabetes mellitus with hyperglycemia: Secondary | ICD-10-CM | POA: Diagnosis not present

## 2020-03-01 DIAGNOSIS — Z6824 Body mass index (BMI) 24.0-24.9, adult: Secondary | ICD-10-CM | POA: Diagnosis not present

## 2020-03-01 DIAGNOSIS — I509 Heart failure, unspecified: Secondary | ICD-10-CM | POA: Diagnosis not present

## 2020-03-01 DIAGNOSIS — E860 Dehydration: Secondary | ICD-10-CM | POA: Diagnosis not present

## 2020-03-01 DIAGNOSIS — N182 Chronic kidney disease, stage 2 (mild): Secondary | ICD-10-CM | POA: Diagnosis not present

## 2020-03-01 DIAGNOSIS — J449 Chronic obstructive pulmonary disease, unspecified: Secondary | ICD-10-CM | POA: Diagnosis not present

## 2020-03-23 DIAGNOSIS — Z6824 Body mass index (BMI) 24.0-24.9, adult: Secondary | ICD-10-CM | POA: Diagnosis not present

## 2020-03-23 DIAGNOSIS — J449 Chronic obstructive pulmonary disease, unspecified: Secondary | ICD-10-CM | POA: Diagnosis not present

## 2020-03-23 DIAGNOSIS — R0602 Shortness of breath: Secondary | ICD-10-CM | POA: Diagnosis not present

## 2020-03-23 DIAGNOSIS — E1122 Type 2 diabetes mellitus with diabetic chronic kidney disease: Secondary | ICD-10-CM | POA: Diagnosis not present

## 2020-03-23 DIAGNOSIS — I509 Heart failure, unspecified: Secondary | ICD-10-CM | POA: Diagnosis not present

## 2020-03-23 DIAGNOSIS — E1165 Type 2 diabetes mellitus with hyperglycemia: Secondary | ICD-10-CM | POA: Diagnosis not present

## 2020-03-23 DIAGNOSIS — N182 Chronic kidney disease, stage 2 (mild): Secondary | ICD-10-CM | POA: Diagnosis not present

## 2020-03-24 ENCOUNTER — Other Ambulatory Visit: Payer: Self-pay

## 2020-03-24 ENCOUNTER — Other Ambulatory Visit (HOSPITAL_COMMUNITY)
Admission: RE | Admit: 2020-03-24 | Discharge: 2020-03-24 | Disposition: A | Discharge status: Home / Self Care | Payer: Medicare Other | Source: Ambulatory Visit | Attending: Family Medicine | Admitting: Family Medicine

## 2020-03-24 DIAGNOSIS — Z20822 Contact with and (suspected) exposure to covid-19: Secondary | ICD-10-CM | POA: Insufficient documentation

## 2020-03-24 DIAGNOSIS — Z01812 Encounter for preprocedural laboratory examination: Secondary | ICD-10-CM | POA: Diagnosis not present

## 2020-03-24 LAB — SARS CORONAVIRUS 2 (TAT 6-24 HRS): SARS Coronavirus 2: NEGATIVE

## 2020-03-28 ENCOUNTER — Ambulatory Visit (HOSPITAL_COMMUNITY)
Admission: RE | Admit: 2020-03-28 | Discharge: 2020-03-28 | Disposition: A | Discharge status: Home / Self Care | Payer: Medicare Other | Source: Ambulatory Visit | Attending: Family Medicine | Admitting: Family Medicine

## 2020-03-28 ENCOUNTER — Other Ambulatory Visit: Payer: Self-pay

## 2020-03-28 DIAGNOSIS — R0602 Shortness of breath: Secondary | ICD-10-CM | POA: Diagnosis not present

## 2020-03-28 LAB — PULMONARY FUNCTION TEST
DL/VA % pred: 100 %
DL/VA: 4.15 ml/min/mmHg/L
DLCO unc % pred: 74 %
DLCO unc: 13.36 ml/min/mmHg
FEF 25-75 Post: 0.74 L/sec
FEF 25-75 Pre: 0.48 L/sec
FEF2575-%Change-Post: 54 %
FEF2575-%Pred-Post: 54 %
FEF2575-%Pred-Pre: 34 %
FEV1-%Change-Post: 11 %
FEV1-%Pred-Post: 63 %
FEV1-%Pred-Pre: 56 %
FEV1-Post: 1.16 L
FEV1-Pre: 1.04 L
FEV1FVC-%Change-Post: 12 %
FEV1FVC-%Pred-Pre: 71 %
FEV6-%Change-Post: 2 %
FEV6-%Pred-Post: 83 %
FEV6-%Pred-Pre: 81 %
FEV6-Post: 1.95 L
FEV6-Pre: 1.9 L
FEV6FVC-%Change-Post: 4 %
FEV6FVC-%Pred-Post: 105 %
FEV6FVC-%Pred-Pre: 101 %
FVC-%Change-Post: 0 %
FVC-%Pred-Post: 79 %
FVC-%Pred-Pre: 79 %
FVC-Post: 1.96 L
FVC-Pre: 1.97 L
Post FEV1/FVC ratio: 59 %
Post FEV6/FVC ratio: 100 %
Pre FEV1/FVC ratio: 53 %
Pre FEV6/FVC Ratio: 96 %
RV % pred: 123 %
RV: 2.84 L
TLC % pred: 98 %
TLC: 4.77 L

## 2020-03-28 MED ORDER — ALBUTEROL SULFATE (2.5 MG/3ML) 0.083% IN NEBU
2.5000 mg | INHALATION_SOLUTION | Freq: Once | RESPIRATORY_TRACT | Status: AC
  Administered 2020-03-28: 2.5 mg via RESPIRATORY_TRACT

## 2020-05-19 DIAGNOSIS — I130 Hypertensive heart and chronic kidney disease with heart failure and stage 1 through stage 4 chronic kidney disease, or unspecified chronic kidney disease: Secondary | ICD-10-CM | POA: Diagnosis not present

## 2020-05-19 DIAGNOSIS — E1122 Type 2 diabetes mellitus with diabetic chronic kidney disease: Secondary | ICD-10-CM | POA: Diagnosis not present

## 2020-05-19 DIAGNOSIS — N182 Chronic kidney disease, stage 2 (mild): Secondary | ICD-10-CM | POA: Diagnosis not present

## 2020-05-19 DIAGNOSIS — I509 Heart failure, unspecified: Secondary | ICD-10-CM | POA: Diagnosis not present

## 2020-07-20 DIAGNOSIS — Z6824 Body mass index (BMI) 24.0-24.9, adult: Secondary | ICD-10-CM | POA: Diagnosis not present

## 2020-07-20 DIAGNOSIS — R3915 Urgency of urination: Secondary | ICD-10-CM | POA: Diagnosis not present

## 2020-07-20 DIAGNOSIS — R350 Frequency of micturition: Secondary | ICD-10-CM | POA: Diagnosis not present

## 2020-07-20 DIAGNOSIS — M545 Low back pain, unspecified: Secondary | ICD-10-CM | POA: Diagnosis not present

## 2020-07-24 DIAGNOSIS — N3000 Acute cystitis without hematuria: Secondary | ICD-10-CM | POA: Diagnosis not present

## 2020-07-24 DIAGNOSIS — R350 Frequency of micturition: Secondary | ICD-10-CM | POA: Diagnosis not present

## 2020-08-01 ENCOUNTER — Ambulatory Visit: Payer: Self-pay | Admitting: Cardiology

## 2020-08-02 DIAGNOSIS — M25561 Pain in right knee: Secondary | ICD-10-CM | POA: Diagnosis not present

## 2020-08-02 DIAGNOSIS — M25562 Pain in left knee: Secondary | ICD-10-CM | POA: Diagnosis not present

## 2020-08-03 ENCOUNTER — Other Ambulatory Visit (HOSPITAL_COMMUNITY): Payer: Self-pay | Admitting: Family Medicine

## 2020-08-03 DIAGNOSIS — M25562 Pain in left knee: Secondary | ICD-10-CM

## 2020-08-11 ENCOUNTER — Ambulatory Visit (HOSPITAL_COMMUNITY)
Admission: RE | Admit: 2020-08-11 | Discharge: 2020-08-11 | Disposition: A | Discharge status: Home / Self Care | Payer: Medicare Other | Source: Ambulatory Visit | Attending: Family Medicine | Admitting: Family Medicine

## 2020-08-11 DIAGNOSIS — M25562 Pain in left knee: Secondary | ICD-10-CM | POA: Insufficient documentation

## 2020-08-12 DIAGNOSIS — S838X2A Sprain of other specified parts of left knee, initial encounter: Secondary | ICD-10-CM | POA: Diagnosis not present

## 2020-08-12 DIAGNOSIS — S838X9A Sprain of other specified parts of unspecified knee, initial encounter: Secondary | ICD-10-CM | POA: Diagnosis not present

## 2020-08-12 DIAGNOSIS — L304 Erythema intertrigo: Secondary | ICD-10-CM | POA: Diagnosis not present

## 2020-08-12 DIAGNOSIS — S82209A Unspecified fracture of shaft of unspecified tibia, initial encounter for closed fracture: Secondary | ICD-10-CM | POA: Diagnosis not present

## 2020-08-16 DIAGNOSIS — S82132A Displaced fracture of medial condyle of left tibia, initial encounter for closed fracture: Secondary | ICD-10-CM | POA: Diagnosis not present

## 2020-08-16 DIAGNOSIS — S8990XA Unspecified injury of unspecified lower leg, initial encounter: Secondary | ICD-10-CM | POA: Diagnosis not present

## 2020-08-16 DIAGNOSIS — W19XXXA Unspecified fall, initial encounter: Secondary | ICD-10-CM | POA: Diagnosis not present

## 2020-08-30 DIAGNOSIS — E1151 Type 2 diabetes mellitus with diabetic peripheral angiopathy without gangrene: Secondary | ICD-10-CM | POA: Diagnosis not present

## 2020-08-30 DIAGNOSIS — I25111 Atherosclerotic heart disease of native coronary artery with angina pectoris with documented spasm: Secondary | ICD-10-CM | POA: Diagnosis not present

## 2020-08-30 DIAGNOSIS — E7849 Other hyperlipidemia: Secondary | ICD-10-CM | POA: Diagnosis not present

## 2020-08-30 DIAGNOSIS — K2100 Gastro-esophageal reflux disease with esophagitis, without bleeding: Secondary | ICD-10-CM | POA: Diagnosis not present

## 2020-08-30 DIAGNOSIS — Z6824 Body mass index (BMI) 24.0-24.9, adult: Secondary | ICD-10-CM | POA: Diagnosis not present

## 2020-08-30 DIAGNOSIS — I10 Essential (primary) hypertension: Secondary | ICD-10-CM | POA: Diagnosis not present

## 2020-08-30 DIAGNOSIS — S82132D Displaced fracture of medial condyle of left tibia, subsequent encounter for closed fracture with routine healing: Secondary | ICD-10-CM | POA: Diagnosis not present

## 2020-08-30 DIAGNOSIS — Z23 Encounter for immunization: Secondary | ICD-10-CM | POA: Diagnosis not present

## 2020-09-06 NOTE — Progress Notes (Signed)
Cardiology Office Note  Date: 09/07/2020   ID: Debbie [name], DOB Jun 23, 1940, MRN [id number]  PCP:  Richardean Chimera, MD  Cardiologist:  [name], MD Electrophysiologist:  None   Chief Complaint  Patient presents with  . Cardiac follow-up    History of Present Illness: Debbie [name] is a 80 y.o. female last seen in September 2021.  She is here for a follow-up visit.  Reports no angina symptoms or nitroglycerin use on current cardiac regimen.  She remains functional with ADLs including housework.  She did fracture her left leg and knee cap (mechanical fall), is in a brace now and using a walker.  She anticipate starting PT soon.  I reviewed her medications which are outlined below.  She does not report any intolerances, doing well on high-dose Lipitor.  She will be seeing Dr. Reuel Boom for follow-up lab work this year.  I personally reviewed her ECG today which shows sinus bradycardia at 50 bpm.  We have had her on bisoprolol with documented PSVT by cardiac monitor.  She does not describe any palpitations and has had no sudden dizziness or syncope.  Past Medical History:  Diagnosis Date  . Allergic rhinitis   . Anxiety   . Arthritis   . Asthma   . Bladder spasms   . CAD (coronary artery disease)    CABG 2006 [facility]  . Carotid artery disease (HCC)   . Collagen vascular disease (HCC)   . COPD (chronic obstructive pulmonary disease) (HCC)   . Cystocele   . Essential hypertension   . GERD (gastroesophageal reflux disease)   . Hyperlipidemia   . Hypothyroidism   . Osteoporosis   . Renal insufficiency   . Vaginal vault prolapse     Past Surgical History:  Procedure Laterality Date  . ANTERIOR AND POSTERIOR REPAIR N/A 04/06/2013   Procedure: CYSTOCELE REPAIR ;  Surgeon: Martina Sinner, MD;  Location: [facility];  Service: Urology;  Laterality: N/A;  . CATARACTS     REMOVED  . COLONOSCOPY    . CORNEA LACERATION REPAIR    . CORONARY ARTERY BYPASS  GRAFT     X 5 VESSELS  . CYSTOSCOPY N/A 04/06/2013   Procedure: CYSTOSCOPY;  Surgeon: Martina Sinner, MD;  Location: [facility];  Service: Urology;  Laterality: N/A;  . HERNIA REPAIR    . SEPTOPLASTY N/A 04/28/2015   Procedure: SEPTOPLASTY;  Surgeon: Melvenia Beam, MD;  Location: [facility] OR;  Service: ENT;  Laterality: N/A;  . SINUS ENDO W/FUSION Bilateral 04/28/2015   Procedure: ENDOSCOPIC SINUS SURGERY WITH NAVIGATION;  Surgeon: Melvenia Beam, MD;  Location: [facility] OR;  Service: ENT;  Laterality: Bilateral;  . TUBAL LIGATION      Current Outpatient Medications  Medication Sig Dispense Refill  . acetaminophen (TYLENOL) 325 MG tablet Take 2 tablets (650 mg total) by mouth every 6 (six) hours as needed for mild pain, fever or headache (or Fever >/= 101). 12 tablet 2  . albuterol (PROVENTIL) (2.5 MG/3ML) 0.083% nebulizer solution Take 3 mLs (2.5 mg total) by nebulization every 6 (six) hours as needed for wheezing or shortness of breath. 75 mL 12  . albuterol (VENTOLIN HFA) 108 (90 Base) MCG/ACT inhaler Inhale 2 puffs into the lungs every 6 (six) hours as needed for wheezing or shortness of breath. 18 g 1  . ALPRAZolam (XANAX) 0.25 MG tablet Take 1 tablet (0.25 mg total) by mouth 2 (two) times daily as needed for anxiety. 30 tablet 0  .  aspirin 81 MG tablet Take 1 tablet (81 mg total) by mouth daily with breakfast. 30 tablet 1  . atorvastatin (LIPITOR) 80 MG tablet Take 1 tablet (80 mg total) by mouth at bedtime. 30 tablet 3  . baclofen (LIORESAL) 10 MG tablet Take 10 mg by mouth daily.    . Biotin 5000 MCG CAPS Take 5,000 mcg by mouth daily.     . bisoprolol (ZEBETA) 5 MG tablet Take 0.5 tablets (2.5 mg total) by mouth daily. 45 tablet 3  . cholecalciferol (VITAMIN D) 1000 units tablet Take 1,000 Units by mouth daily.    . famotidine (PEPCID) 20 MG tablet Take 20 mg by mouth daily.    . fluticasone (FLONASE) 50 MCG/ACT nasal spray Place 2 sprays into the nose daily as needed for allergies or rhinitis.      . furosemide (LASIX) 80 MG tablet Take 1 tablet (80 mg total) by mouth daily. 30 tablet 3  . guaiFENesin (MUCINEX) 600 MG 12 hr tablet Take 1 tablet (600 mg total) by mouth 2 (two) times daily. 20 tablet 0  . Hypromellose (ARTIFICIAL TEARS OP) Place 1 drop into both eyes daily as needed (dry eyes).    . isosorbide mononitrate (IMDUR) 30 MG 24 hr tablet Take 1 tablet (30 mg total) by mouth daily. 90 tablet 2  . levothyroxine (SYNTHROID) 88 MCG tablet Take 88 mcg by mouth daily.    [name] lisinopril (ZESTRIL) 10 MG tablet Take 1 tablet (10 mg total) by mouth daily. 90 tablet 1  . montelukast (SINGULAIR) 10 MG tablet Take 10 mg by mouth at bedtime.    . nitroGLYCERIN (NITROSTAT) 0.4 MG SL tablet Place 1 tablet (0.4 mg total) under the tongue every 5 (five) minutes x 3 doses as needed for chest pain (if no relief after 3rd dose, call 911 or proceed to the ED for an evaluation). 25 tablet 2  . potassium chloride (MICRO-K) 10 MEQ CR capsule Take 20 mEq by mouth 2 (two) times daily.     . predniSONE (DELTASONE) 20 MG tablet Take 1 tablet (20 mg total) by mouth daily with breakfast. (Patient taking differently: Take 20 mg by mouth daily with breakfast. Does note take daily) 5 tablet 0  . umeclidinium-vilanterol (ANORO ELLIPTA) 62.5-25 MCG/INH AEPB Inhale 1 puff into the lungs daily. 60 each 3   No current facility-administered medications for this visit.   Allergies:  Ivp dye [iodinated diagnostic agents], Betadine [povidone iodine], Codeine, Other, Penicillins, and Sulfa antibiotics   ROS: No syncope.  Physical Exam: VS:  BP (!) 142/60   Pulse (!) 50   Ht 5\' 2"  (1.575 m)   Wt 131 lb (59.4 kg)   SpO2 96%   BMI 23.96 kg/m , BMI Body mass index is 23.96 kg/m.  Wt Readings from Last 3 Encounters:  09/07/20 131 lb (59.4 kg)  01/20/20 136 lb (61.7 kg)  09/17/19 138 lb (62.6 kg)    General: Elderly woman, appears comfortable at rest. HEENT: Conjunctiva and lids normal, wearing a mask. Neck: Supple,  no elevated JVP or carotid bruits, no thyromegaly. Lungs: Clear to auscultation, nonlabored breathing at rest. Cardiac: Regular rate and rhythm, no S3 or significant systolic murmur, no pericardial rub. Extremities: Brace on left leg.  ECG:  An ECG dated 08/06/2019 was personally reviewed today and demonstrated:  Sinus rhythm with nonspecific ST changes.  Recent Labwork:  January 2021: Hemoglobin 13.5, platelets 417 March 2021: BUN 14, creatinine 1.03, potassium 4.3, AST 15, ALT 10  Other Studies Reviewed Today:  Lexiscan Myoview 08/25/2019:  There was no ST segment deviation noted during stress.  The study is normal. There are no perfusion defects  The left ventricular ejection fraction is mildly decreased (41%).  Intermediate risk based solely on calculated LVEF. Visually LV function appears normal, consider correlating with echocardiogram. There is no significant ischemia.  Echocardiogram 10/07/2019: 1. Left ventricular ejection fraction, by estimation, is 65 to 70%. The  left ventricle has normal function. The left ventricle has no regional  wall motion abnormalities. There is mild left ventricular hypertrophy.  Left ventricular diastolic parameters  were normal.  2. Right ventricular systolic function is normal. The right ventricular  size is normal. There is normal pulmonary artery systolic pressure.  3. Left atrial size was mildly dilated.  4. The mitral valve is normal in structure. Trivial mitral valve  regurgitation. No evidence of mitral stenosis.  5. The aortic valve is tricuspid. Aortic valve regurgitation is not  visualized. No aortic stenosis is present.  6. The inferior vena cava is normal in size with greater than 50%  respiratory variability, suggesting right atrial pressure of 3 mmHg.   Cardiac monitor May 2021: ZIO XT reviewed. 6 days 8 hours analyzed. Predominant rhythm is sinus with heart rate ranging from 45 bpm up to 117 bpm and average heart  rate 71 bpm. Rare PACs and PVCs were noted representing less than 1% of total beats. There were brief episodes of SVT, the longest of which lasted 8.7 seconds. There were no sustained arrhythmias or pauses.  Assessment and Plan:  1.  CAD status post CABG in 2006.  She continues to do well without active angina symptoms.  No significant ischemic territories by follow-up Myoview in April of last year and LVEF vigorous at 65 to 70% by concurrent echocardiogram.  ECG reviewed.  Continue observation on medical therapy.  She is on aspirin, Lipitor, Imdur, lisinopril, and bisoprolol.  2.  Documented brief episodes of PSVT, currently without palpitations on low-dose beta-blocker.  3.  Mixed hyperlipidemia, continues on high-dose Lipitor.  Follow-up lab work with Dr. Reuel Boom as scheduled this year.  Medication Adjustments/Labs and Tests Ordered: Current medicines are reviewed at length with the patient today.  Concerns regarding medicines are outlined above.   Tests Ordered: Orders Placed This Encounter  Procedures  . EKG 12-Lead    Medication Changes: No orders of the defined types were placed in this encounter.   Disposition:  Follow up 6 months.  Signed, Jonelle Sidle, MD, [facility] 09/07/2020 8:44 AM    [facility] at [facility] [address] [place], [place], [place] [address] Phone: [phone]; Fax: [phone]

## 2020-09-07 ENCOUNTER — Other Ambulatory Visit: Payer: Self-pay

## 2020-09-07 ENCOUNTER — Ambulatory Visit: Payer: Medicare Other | Admitting: Cardiology

## 2020-09-07 ENCOUNTER — Encounter: Payer: Self-pay | Admitting: Cardiology

## 2020-09-07 VITALS — BP 142/60 | HR 50 | Ht 62.0 in | Wt 131.0 lb

## 2020-09-07 DIAGNOSIS — E782 Mixed hyperlipidemia: Secondary | ICD-10-CM | POA: Diagnosis not present

## 2020-09-07 DIAGNOSIS — I471 Supraventricular tachycardia: Secondary | ICD-10-CM | POA: Diagnosis not present

## 2020-09-07 DIAGNOSIS — I25119 Atherosclerotic heart disease of native coronary artery with unspecified angina pectoris: Secondary | ICD-10-CM | POA: Diagnosis not present

## 2020-09-07 NOTE — Patient Instructions (Addendum)

## 2020-09-20 DIAGNOSIS — S82132D Displaced fracture of medial condyle of left tibia, subsequent encounter for closed fracture with routine healing: Secondary | ICD-10-CM | POA: Diagnosis not present

## 2020-09-29 DIAGNOSIS — Z8739 Personal history of other diseases of the musculoskeletal system and connective tissue: Secondary | ICD-10-CM | POA: Diagnosis not present

## 2020-09-29 DIAGNOSIS — M12811 Other specific arthropathies, not elsewhere classified, right shoulder: Secondary | ICD-10-CM | POA: Diagnosis not present

## 2020-09-29 DIAGNOSIS — S82132D Displaced fracture of medial condyle of left tibia, subsequent encounter for closed fracture with routine healing: Secondary | ICD-10-CM | POA: Diagnosis not present

## 2020-09-29 DIAGNOSIS — S4991XA Unspecified injury of right shoulder and upper arm, initial encounter: Secondary | ICD-10-CM | POA: Diagnosis not present

## 2020-09-29 DIAGNOSIS — M25511 Pain in right shoulder: Secondary | ICD-10-CM | POA: Diagnosis not present

## 2020-10-11 DIAGNOSIS — Z8739 Personal history of other diseases of the musculoskeletal system and connective tissue: Secondary | ICD-10-CM | POA: Diagnosis not present

## 2020-10-11 DIAGNOSIS — M12811 Other specific arthropathies, not elsewhere classified, right shoulder: Secondary | ICD-10-CM | POA: Diagnosis not present

## 2020-10-11 DIAGNOSIS — S4991XA Unspecified injury of right shoulder and upper arm, initial encounter: Secondary | ICD-10-CM | POA: Diagnosis not present

## 2020-10-11 DIAGNOSIS — M25511 Pain in right shoulder: Secondary | ICD-10-CM | POA: Diagnosis not present

## 2020-10-18 DIAGNOSIS — X58XXXA Exposure to other specified factors, initial encounter: Secondary | ICD-10-CM | POA: Diagnosis not present

## 2020-10-18 DIAGNOSIS — M12811 Other specific arthropathies, not elsewhere classified, right shoulder: Secondary | ICD-10-CM | POA: Diagnosis not present

## 2020-10-18 DIAGNOSIS — S4991XA Unspecified injury of right shoulder and upper arm, initial encounter: Secondary | ICD-10-CM | POA: Diagnosis not present

## 2020-10-18 DIAGNOSIS — S82132D Displaced fracture of medial condyle of left tibia, subsequent encounter for closed fracture with routine healing: Secondary | ICD-10-CM | POA: Diagnosis not present

## 2020-10-19 DIAGNOSIS — M25511 Pain in right shoulder: Secondary | ICD-10-CM | POA: Diagnosis not present

## 2020-12-06 DIAGNOSIS — Z6824 Body mass index (BMI) 24.0-24.9, adult: Secondary | ICD-10-CM | POA: Diagnosis not present

## 2020-12-06 DIAGNOSIS — R300 Dysuria: Secondary | ICD-10-CM | POA: Diagnosis not present

## 2020-12-14 DIAGNOSIS — R300 Dysuria: Secondary | ICD-10-CM | POA: Diagnosis not present

## 2020-12-22 DIAGNOSIS — E7849 Other hyperlipidemia: Secondary | ICD-10-CM | POA: Diagnosis not present

## 2020-12-22 DIAGNOSIS — Z23 Encounter for immunization: Secondary | ICD-10-CM | POA: Diagnosis not present

## 2020-12-22 DIAGNOSIS — I10 Essential (primary) hypertension: Secondary | ICD-10-CM | POA: Diagnosis not present

## 2020-12-22 DIAGNOSIS — I25111 Atherosclerotic heart disease of native coronary artery with angina pectoris with documented spasm: Secondary | ICD-10-CM | POA: Diagnosis not present

## 2020-12-22 DIAGNOSIS — E782 Mixed hyperlipidemia: Secondary | ICD-10-CM | POA: Diagnosis not present

## 2020-12-22 DIAGNOSIS — E1151 Type 2 diabetes mellitus with diabetic peripheral angiopathy without gangrene: Secondary | ICD-10-CM | POA: Diagnosis not present

## 2020-12-22 DIAGNOSIS — K2100 Gastro-esophageal reflux disease with esophagitis, without bleeding: Secondary | ICD-10-CM | POA: Diagnosis not present

## 2020-12-22 DIAGNOSIS — Z0001 Encounter for general adult medical examination with abnormal findings: Secondary | ICD-10-CM | POA: Diagnosis not present

## 2021-02-07 ENCOUNTER — Ambulatory Visit (INDEPENDENT_AMBULATORY_CARE_PROVIDER_SITE_OTHER): Payer: Medicare Other | Admitting: Urology

## 2021-02-07 ENCOUNTER — Encounter: Payer: Self-pay | Admitting: Urology

## 2021-02-07 ENCOUNTER — Other Ambulatory Visit: Payer: Self-pay

## 2021-02-07 VITALS — BP 175/71 | HR 54 | Ht 63.0 in | Wt 134.1 lb

## 2021-02-07 DIAGNOSIS — R339 Retention of urine, unspecified: Secondary | ICD-10-CM

## 2021-02-07 DIAGNOSIS — N3281 Overactive bladder: Secondary | ICD-10-CM | POA: Diagnosis not present

## 2021-02-07 DIAGNOSIS — N3000 Acute cystitis without hematuria: Secondary | ICD-10-CM

## 2021-02-07 MED ORDER — MIRABEGRON ER 25 MG PO TB24: 25.0000 mg | ORAL_TABLET | Freq: Every day | ORAL | 0 refills | Status: DC

## 2021-02-07 NOTE — Patient Instructions (Signed)

## 2021-02-07 NOTE — Progress Notes (Signed)
Urological Symptom Review  Patient is experiencing the following symptoms: Frequent urination Hard to postpone urination Get up at night to urinate Leakage of urine Stream starts and stops   Review of Systems  Gastrointestinal (upper)  : Indigestion/heartburn  Gastrointestinal (lower) : Diarrhea  Constitutional : Weight loss  Skin: Negative for skin symptoms  Eyes: Negative for eye symptoms  Ear/Nose/Throat : Sinus problems  Hematologic/Lymphatic: Easy bruising  Cardiovascular : Leg swelling  Respiratory : Shortness of breath  Endocrine: Excessive thirst  Musculoskeletal: Joint pain  Neurological: Negative for neurological symptoms Feels off balance  Psychologic: Anxiety

## 2021-02-07 NOTE — Progress Notes (Signed)
02/07/2021 11:53 AM   Debbie [name] 08-30-1940 [id number]  Referring provider: Richardean Chimera, MD [address] [place],  [place] [address]  Urge incontinence   HPI: [name] is a 80yo here for evaluation of urinary incontinence. Starting year ago she noticed worsening urgency and urge incontinence. She uses 6-10 pads per day which are wet. She has nocturia 3-4x. She is on lasix 20mg  daily. She has severe urgency, frequency. She has mild SUI. She has a hx of 2 bladder tacks which he last surgery 4-5 years ago. No feeling of incomplete emptying. No hx of UTI. She was tried on an OAb medication but she does not recall the medication. She has issues with constipation and takes a laxative PRN. PVR 116cc.    PMH: Past Medical History:  Diagnosis Date   Allergic rhinitis    Anxiety    Arthritis    Asthma    Bladder spasms    CAD (coronary artery disease)    CABG 2006 [facility]   Carotid artery disease (HCC)    Collagen vascular disease (HCC)    COPD (chronic obstructive pulmonary disease) (HCC)    Cystocele    Essential hypertension    GERD (gastroesophageal reflux disease)    Hyperlipidemia    Hypothyroidism    Osteoporosis    Renal insufficiency    Vaginal vault prolapse     Surgical History: Past Surgical History:  Procedure Laterality Date   ANTERIOR AND POSTERIOR REPAIR N/A 04/06/2013   Procedure: CYSTOCELE REPAIR ;  Surgeon: 04/08/2013, MD;  Location: [facility];  Service: Urology;  Laterality: N/A;   CATARACTS     REMOVED   COLONOSCOPY     CORNEA LACERATION REPAIR     CORONARY ARTERY BYPASS GRAFT     X 5 VESSELS   CYSTOSCOPY N/A 04/06/2013   Procedure: CYSTOSCOPY;  Surgeon: 04/08/2013, MD;  Location: [facility];  Service: Urology;  Laterality: N/A;   HERNIA REPAIR     SEPTOPLASTY N/A 04/28/2015   Procedure: SEPTOPLASTY;  Surgeon: 14/01/2015, MD;  Location: [facility] OR;  Service: ENT;  Laterality: N/A;   SINUS ENDO W/FUSION Bilateral 04/28/2015    Procedure: ENDOSCOPIC SINUS SURGERY WITH NAVIGATION;  Surgeon: 14/01/2015, MD;  Location: [facility] OR;  Service: ENT;  Laterality: Bilateral;   TUBAL LIGATION      Home Medications:  Allergies as of 02/07/2021       Reactions   Ivp Dye [iodinated Diagnostic Agents] Swelling   Kidney Dye   Betadine [povidone Iodine] Rash   Codeine Nausea And Vomiting   Other Other (See Comments)   ALL NARCOTICS   Penicillins Rash   Did it involve swelling of the face/tongue/throat, SOB, or low BP? No Did it involve sudden or severe rash/hives, skin peeling, or any reaction on the inside of your mouth or nose? No Did you need to seek medical attention at a hospital or doctor's office? No When did it last happen?       If all above answers are "NO", may proceed with cephalosporin use.   Sulfa Antibiotics Rash        Medication List        Accurate as of February 07, 2021 11:53 AM. If you have any questions, ask your nurse or doctor.          acetaminophen 325 MG tablet Commonly known as: TYLENOL Take 2 tablets (650 mg total) by mouth every 6 (six) hours as  needed for mild pain, fever or headache (or Fever >/= 101).   albuterol 108 (90 Base) MCG/ACT inhaler Commonly known as: VENTOLIN HFA Inhale 2 puffs into the lungs every 6 (six) hours as needed for wheezing or shortness of breath.   albuterol (2.5 MG/3ML) 0.083% nebulizer solution Commonly known as: PROVENTIL Take 3 mLs (2.5 mg total) by nebulization every 6 (six) hours as needed for wheezing or shortness of breath.   ALPRAZolam 0.25 MG tablet Commonly known as: XANAX Take 1 tablet (0.25 mg total) by mouth 2 (two) times daily as needed for anxiety.   ARTIFICIAL TEARS OP Place 1 drop into both eyes daily as needed (dry eyes).   aspirin 81 MG tablet Take 1 tablet (81 mg total) by mouth daily with breakfast.   atorvastatin 80 MG tablet Commonly known as: LIPITOR Take 1 tablet (80 mg total) by mouth at bedtime.   baclofen 10 MG  tablet Commonly known as: LIORESAL Take 10 mg by mouth daily.   Biotin 5000 MCG Caps Take 5,000 mcg by mouth daily.   bisoprolol 5 MG tablet Commonly known as: ZEBETA Take 0.5 tablets (2.5 mg total) by mouth daily.   cholecalciferol 1000 units tablet Commonly known as: VITAMIN D Take 1,000 Units by mouth daily.   famotidine 20 MG tablet Commonly known as: PEPCID Take 20 mg by mouth daily.   fluticasone 50 MCG/ACT nasal spray Commonly known as: FLONASE Place 2 sprays into the nose daily as needed for allergies or rhinitis.   furosemide 80 MG tablet Commonly known as: LASIX Take 1 tablet (80 mg total) by mouth daily.   guaiFENesin 600 MG 12 hr tablet Commonly known as: MUCINEX Take 1 tablet (600 mg total) by mouth 2 (two) times daily.   isosorbide mononitrate 30 MG 24 hr tablet Commonly known as: IMDUR Take 1 tablet (30 mg total) by mouth daily.   levothyroxine 88 MCG tablet Commonly known as: SYNTHROID Take 88 mcg by mouth daily.   lisinopril 10 MG tablet Commonly known as: ZESTRIL Take 1 tablet (10 mg total) by mouth daily.   montelukast 10 MG tablet Commonly known as: SINGULAIR Take 10 mg by mouth at bedtime.   nitroGLYCERIN 0.4 MG SL tablet Commonly known as: NITROSTAT Place 1 tablet (0.4 mg total) under the tongue every 5 (five) minutes x 3 doses as needed for chest pain (if no relief after 3rd dose, call 911 or proceed to the ED for an evaluation).   potassium chloride 10 MEQ CR capsule Commonly known as: MICRO-K Take 20 mEq by mouth 2 (two) times daily.   predniSONE 20 MG tablet Commonly known as: Deltasone Take 1 tablet (20 mg total) by mouth daily with breakfast. What changed: additional instructions   umeclidinium-vilanterol 62.5-25 MCG/INH Aepb Commonly known as: ANORO ELLIPTA Inhale 1 puff into the lungs daily.        Allergies:  Allergies  Allergen Reactions   Ivp Dye [Iodinated Diagnostic Agents] Swelling    Kidney Dye   Betadine  [Povidone Iodine] Rash   Codeine Nausea And Vomiting   Other Other (See Comments)    ALL NARCOTICS   Penicillins Rash    Did it involve swelling of the face/tongue/throat, SOB, or low BP? No Did it involve sudden or severe rash/hives, skin peeling, or any reaction on the inside of your mouth or nose? No Did you need to seek medical attention at a hospital or doctor's office? No When did it last happen?  If all above answers are "NO", may proceed with cephalosporin use.    Sulfa Antibiotics Rash    Family History: Family History  Problem Relation Age of Onset   Cirrhosis Father    Lung disease Father    Diabetes Mellitus II Mother    Heart Problems Mother     Social History:  reports that she has never smoked. She has never used smokeless tobacco. She reports current alcohol use. She reports that she does not use drugs.  ROS: All other review of systems were reviewed and are negative except what is noted above in HPI  Physical Exam: BP (!) 175/71   Pulse (!) 54   Ht 5\' 3"  (1.6 m)   Wt 134 lb 2 oz (60.8 kg)   BMI 23.76 kg/m   Constitutional:  Alert and oriented, No acute distress. HEENT: [place] AT, moist mucus membranes.  Trachea midline, no masses. Cardiovascular: No clubbing, cyanosis, or edema. Respiratory: Normal respiratory effort, no increased work of breathing. GI: Abdomen is soft, nontender, nondistended, no abdominal masses GU: No CVA tenderness.  Lymph: No cervical or inguinal lymphadenopathy. Skin: No rashes, bruises or suspicious lesions. Neurologic: Grossly intact, no focal deficits, moving all 4 extremities. Psychiatric: Normal mood and affect.  Laboratory Data: Lab Results  Component Value Date   WBC 6.6 01/01/2019   HGB 12.3 01/01/2019   HCT 38.7 01/01/2019   MCV 86.2 01/01/2019   PLT 341 01/01/2019    Lab Results  Component Value Date   CREATININE 0.90 08/06/2019    No results found for: PSA  No results found for: TESTOSTERONE  Lab  Results  Component Value Date   HGBA1C 6.1 (H) 11/06/2016    Urinalysis    Component Value Date/Time   COLORURINE YELLOW 12/31/2018 1844   APPEARANCEUR CLEAR 12/31/2018 1844   LABSPEC 1.008 12/31/2018 1844   PHURINE 6.0 12/31/2018 1844   GLUCOSEU NEGATIVE 12/31/2018 1844   HGBUR NEGATIVE 12/31/2018 1844   BILIRUBINUR NEGATIVE 12/31/2018 1844   KETONESUR NEGATIVE 12/31/2018 1844   PROTEINUR NEGATIVE 12/31/2018 1844   UROBILINOGEN 0.2 09/20/2014 1653   NITRITE NEGATIVE 12/31/2018 1844   LEUKOCYTESUR NEGATIVE 12/31/2018 1844    No results found for: LABMICR, WBCUA, RBCUA, LABEPIT, MUCUS, BACTERIA  Pertinent Imaging:  No results found for this or any previous visit.  No results found for this or any previous visit.  No results found for this or any previous visit.  No results found for this or any previous visit.  No results found for this or any previous visit.  No results found for this or any previous visit.  No results found for this or any previous visit.  No results found for this or any previous visit.   Assessment & Plan:    1. Acute cystitis without hematuria -urine for culture - Urine Culture  3. OAB (overactive bladder) -We will start mirabegron 25mg  daily. RTC 4 weeks with PVR   No follow-ups on file.  01/02/2019, MD  [facility] [place]

## 2021-02-09 LAB — URINE CULTURE

## 2021-02-13 ENCOUNTER — Telehealth: Payer: Self-pay

## 2021-02-13 NOTE — Telephone Encounter (Signed)
Patient called and notified of negative results.

## 2021-02-13 NOTE — Telephone Encounter (Signed)
-----   Message from Malen Gauze, MD sent at 02/12/2021 12:36 PM EDT ----- negative ----- Message ----- From: Ferdinand Lango, RN Sent: 02/09/2021   8:14 AM EDT To: Malen Gauze, MD  Please review

## 2021-02-20 DIAGNOSIS — M79604 Pain in right leg: Secondary | ICD-10-CM | POA: Diagnosis not present

## 2021-02-20 DIAGNOSIS — Z6824 Body mass index (BMI) 24.0-24.9, adult: Secondary | ICD-10-CM | POA: Diagnosis not present

## 2021-02-20 DIAGNOSIS — M7061 Trochanteric bursitis, right hip: Secondary | ICD-10-CM | POA: Diagnosis not present

## 2021-03-05 ENCOUNTER — Telehealth: Payer: Self-pay

## 2021-03-05 NOTE — Telephone Encounter (Signed)
Patient calling to get a sample of free samples.  Please call pt back to let her know.  Call back: [phone]  Thanks, Rosey Bath

## 2021-03-05 NOTE — Telephone Encounter (Signed)
Notified patient samples will be at the front office.

## 2021-03-07 ENCOUNTER — Ambulatory Visit: Payer: Medicare Other | Admitting: Urology

## 2021-03-07 DIAGNOSIS — R339 Retention of urine, unspecified: Secondary | ICD-10-CM

## 2021-03-07 DIAGNOSIS — N3281 Overactive bladder: Secondary | ICD-10-CM

## 2021-03-14 ENCOUNTER — Encounter: Payer: Self-pay | Admitting: Urology

## 2021-03-14 ENCOUNTER — Ambulatory Visit (INDEPENDENT_AMBULATORY_CARE_PROVIDER_SITE_OTHER): Payer: Medicare Other | Admitting: Urology

## 2021-03-14 ENCOUNTER — Other Ambulatory Visit: Payer: Self-pay

## 2021-03-14 VITALS — BP 153/80 | HR 43 | Ht 63.0 in | Wt 131.2 lb

## 2021-03-14 DIAGNOSIS — R339 Retention of urine, unspecified: Secondary | ICD-10-CM | POA: Diagnosis not present

## 2021-03-14 DIAGNOSIS — N3281 Overactive bladder: Secondary | ICD-10-CM

## 2021-03-14 LAB — URINALYSIS, ROUTINE W REFLEX MICROSCOPIC
Bilirubin, UA: NEGATIVE
Glucose, UA: NEGATIVE
Ketones, UA: NEGATIVE
Nitrite, UA: POSITIVE — AB
Protein,UA: NEGATIVE
Specific Gravity, UA: 1.02 (ref 1.005–1.030)
Urobilinogen, Ur: 0.2 mg/dL (ref 0.2–1.0)
pH, UA: 5.5 (ref 5.0–7.5)

## 2021-03-14 LAB — MICROSCOPIC EXAMINATION
Epithelial Cells (non renal): >10 /hpf — AB (ref 0–10)
Renal Epithel, UA: NONE SEEN /hpf
WBC, UA: >30 /hpf — AB (ref 0–5)

## 2021-03-14 LAB — BLADDER SCAN AMB NON-IMAGING: Scan Result: 42

## 2021-03-14 MED ORDER — MIRABEGRON ER 25 MG PO TB24: 25.0000 mg | ORAL_TABLET | Freq: Every day | ORAL | 11 refills | Status: DC

## 2021-03-14 NOTE — Progress Notes (Signed)
Urological Symptom Review  Patient is experiencing the following symptoms: Frequent urination Hard to postpone urination Get up at night to urinate Leakage of urine Stream starts and stops Trouble starting stream   Review of Systems  Gastrointestinal (upper)  : Indigestion/heartburn  Gastrointestinal (lower) : Diarrhea  Constitutional : Night Sweats  Skin: Negative for skin symptoms  Eyes: Negative for eye symptoms  Ear/Nose/Throat : Sinus problems  Hematologic/Lymphatic: Negative for Hematologic/Lymphatic symptoms  Cardiovascular : Leg swelling  Respiratory : Shortness of breath  Endocrine: Excessive thirst  Musculoskeletal: Joint pain  Neurological: Dizziness  Psychologic: Negative for psychiatric symptoms

## 2021-03-14 NOTE — Progress Notes (Signed)
03/14/2021 1:33 PM   Debbie [name] 1940/11/27 [id number]  Referring provider: Richardean Chimera, MD [address] [place],  [place] [address]  Followup OAb and incomplete bladder emptying   HPI: [name] is a 80yo here for followup for OAb and incomplete bladder emptying.  PVR 42cc. She has a strong urinary stream. She was started on mirabegron 25mg  which significantly improved her urinary urgency, urge incontinence and nocturia. Nocturia 0-1x. She has rare incontinent episodes. She uses 1-2 pads per day which are rarely damp. She is very happy with her response to mirabegron. No other complaints today   PMH: Past Medical History:  Diagnosis Date   Allergic rhinitis    Anxiety    Arthritis    Asthma    Bladder spasms    CAD (coronary artery disease)    CABG 2006 [facility]   Carotid artery disease (HCC)    Collagen vascular disease (HCC)    COPD (chronic obstructive pulmonary disease) (HCC)    Cystocele    Essential hypertension    GERD (gastroesophageal reflux disease)    Hyperlipidemia    Hypothyroidism    Osteoporosis    Renal insufficiency    Vaginal vault prolapse     Surgical History: Past Surgical History:  Procedure Laterality Date   ANTERIOR AND POSTERIOR REPAIR N/A 04/06/2013   Procedure: CYSTOCELE REPAIR ;  Surgeon: 04/08/2013, MD;  Location: [facility];  Service: Urology;  Laterality: N/A;   CATARACTS     REMOVED   COLONOSCOPY     CORNEA LACERATION REPAIR     CORONARY ARTERY BYPASS GRAFT     X 5 VESSELS   CYSTOSCOPY N/A 04/06/2013   Procedure: CYSTOSCOPY;  Surgeon: 04/08/2013, MD;  Location: [facility];  Service: Urology;  Laterality: N/A;   HERNIA REPAIR     SEPTOPLASTY N/A 04/28/2015   Procedure: SEPTOPLASTY;  Surgeon: 14/01/2015, MD;  Location: [facility] OR;  Service: ENT;  Laterality: N/A;   SINUS ENDO W/FUSION Bilateral 04/28/2015   Procedure: ENDOSCOPIC SINUS SURGERY WITH NAVIGATION;  Surgeon: 14/01/2015, MD;  Location: [facility] OR;   Service: ENT;  Laterality: Bilateral;   TUBAL LIGATION      Home Medications:  Allergies as of 03/14/2021       Reactions   Ivp Dye [iodinated Diagnostic Agents] Swelling   Kidney Dye   Betadine [povidone Iodine] Rash   Codeine Nausea And Vomiting   Other Other (See Comments)   ALL NARCOTICS   Penicillins Rash   Did it involve swelling of the face/tongue/throat, SOB, or low BP? No Did it involve sudden or severe rash/hives, skin peeling, or any reaction on the inside of your mouth or nose? No Did you need to seek medical attention at a hospital or doctor's office? No When did it last happen?       If all above answers are "NO", may proceed with cephalosporin use.   Sulfa Antibiotics Rash        Medication List        Accurate as of March 14, 2021  1:33 PM. If you have any questions, ask your nurse or doctor.          acetaminophen 325 MG tablet Commonly known as: TYLENOL Take 2 tablets (650 mg total) by mouth every 6 (six) hours as needed for mild pain, fever or headache (or Fever >/= 101).   albuterol 108 (90 Base) MCG/ACT inhaler Commonly known as: VENTOLIN HFA Inhale 2  puffs into the lungs every 6 (six) hours as needed for wheezing or shortness of breath.   albuterol (2.5 MG/3ML) 0.083% nebulizer solution Commonly known as: PROVENTIL Take 3 mLs (2.5 mg total) by nebulization every 6 (six) hours as needed for wheezing or shortness of breath.   ALPRAZolam 0.25 MG tablet Commonly known as: XANAX Take 1 tablet (0.25 mg total) by mouth 2 (two) times daily as needed for anxiety.   ARTIFICIAL TEARS OP Place 1 drop into both eyes daily as needed (dry eyes).   aspirin 81 MG tablet Take 1 tablet (81 mg total) by mouth daily with breakfast.   atorvastatin 80 MG tablet Commonly known as: LIPITOR Take 1 tablet (80 mg total) by mouth at bedtime.   baclofen 10 MG tablet Commonly known as: LIORESAL Take 10 mg by mouth daily.   Biotin 5000 MCG Caps Take 5,000 mcg  by mouth daily.   bisoprolol 5 MG tablet Commonly known as: ZEBETA Take 0.5 tablets (2.5 mg total) by mouth daily.   cholecalciferol 1000 units tablet Commonly known as: VITAMIN D Take 1,000 Units by mouth daily.   famotidine 20 MG tablet Commonly known as: PEPCID Take 20 mg by mouth daily.   fluticasone 50 MCG/ACT nasal spray Commonly known as: FLONASE Place 2 sprays into the nose daily as needed for allergies or rhinitis.   furosemide 80 MG tablet Commonly known as: LASIX Take 1 tablet (80 mg total) by mouth daily.   guaiFENesin 600 MG 12 hr tablet Commonly known as: MUCINEX Take 1 tablet (600 mg total) by mouth 2 (two) times daily.   isosorbide mononitrate 30 MG 24 hr tablet Commonly known as: IMDUR Take 1 tablet (30 mg total) by mouth daily.   levothyroxine 88 MCG tablet Commonly known as: SYNTHROID Take 88 mcg by mouth daily.   lisinopril 10 MG tablet Commonly known as: ZESTRIL Take 1 tablet (10 mg total) by mouth daily.   mirabegron ER 25 MG Tb24 tablet Commonly known as: MYRBETRIQ Take 1 tablet (25 mg total) by mouth daily.   montelukast 10 MG tablet Commonly known as: SINGULAIR Take 10 mg by mouth at bedtime.   nitroGLYCERIN 0.4 MG SL tablet Commonly known as: NITROSTAT Place 1 tablet (0.4 mg total) under the tongue every 5 (five) minutes x 3 doses as needed for chest pain (if no relief after 3rd dose, call 911 or proceed to the ED for an evaluation).   potassium chloride 10 MEQ CR capsule Commonly known as: MICRO-K Take 20 mEq by mouth 2 (two) times daily.   predniSONE 20 MG tablet Commonly known as: Deltasone Take 1 tablet (20 mg total) by mouth daily with breakfast. What changed: additional instructions   umeclidinium-vilanterol 62.5-25 MCG/INH Aepb Commonly known as: ANORO ELLIPTA Inhale 1 puff into the lungs daily.        Allergies:  Allergies  Allergen Reactions   Ivp Dye [Iodinated Diagnostic Agents] Swelling    Kidney Dye    Betadine [Povidone Iodine] Rash   Codeine Nausea And Vomiting   Other Other (See Comments)    ALL NARCOTICS   Penicillins Rash    Did it involve swelling of the face/tongue/throat, SOB, or low BP? No Did it involve sudden or severe rash/hives, skin peeling, or any reaction on the inside of your mouth or nose? No Did you need to seek medical attention at a hospital or doctor's office? No When did it last happen?       If all above  answers are "NO", may proceed with cephalosporin use.    Sulfa Antibiotics Rash    Family History: Family History  Problem Relation Age of Onset   Cirrhosis Father    Lung disease Father    Diabetes Mellitus II Mother    Heart Problems Mother     Social History:  reports that she has never smoked. She has never used smokeless tobacco. She reports current alcohol use. She reports that she does not use drugs.  ROS: All other review of systems were reviewed and are negative except what is noted above in HPI  Physical Exam: BP (!) 153/80   Pulse (!) 43   Ht 5\' 3"  (1.6 m)   Wt 131 lb 4 oz (59.5 kg)   BMI 23.25 kg/m   Constitutional:  Alert and oriented, No acute distress. HEENT: [place] AT, moist mucus membranes.  Trachea midline, no masses. Cardiovascular: No clubbing, cyanosis, or edema. Respiratory: Normal respiratory effort, no increased work of breathing. GI: Abdomen is soft, nontender, nondistended, no abdominal masses GU: No CVA tenderness.  Lymph: No cervical or inguinal lymphadenopathy. Skin: No rashes, bruises or suspicious lesions. Neurologic: Grossly intact, no focal deficits, moving all 4 extremities. Psychiatric: Normal mood and affect.  Laboratory Data: Lab Results  Component Value Date   WBC 6.6 01/01/2019   HGB 12.3 01/01/2019   HCT 38.7 01/01/2019   MCV 86.2 01/01/2019   PLT 341 01/01/2019    Lab Results  Component Value Date   CREATININE 0.90 08/06/2019    No results found for: PSA  No results found for:  TESTOSTERONE  Lab Results  Component Value Date   HGBA1C 6.1 (H) 11/06/2016    Urinalysis    Component Value Date/Time   COLORURINE YELLOW 12/31/2018 1844   APPEARANCEUR CLEAR 12/31/2018 1844   LABSPEC 1.008 12/31/2018 1844   PHURINE 6.0 12/31/2018 1844   GLUCOSEU NEGATIVE 12/31/2018 1844   HGBUR NEGATIVE 12/31/2018 1844   BILIRUBINUR NEGATIVE 12/31/2018 1844   KETONESUR NEGATIVE 12/31/2018 1844   PROTEINUR NEGATIVE 12/31/2018 1844   UROBILINOGEN 0.2 09/20/2014 1653   NITRITE NEGATIVE 12/31/2018 1844   LEUKOCYTESUR NEGATIVE 12/31/2018 1844    No results found for: LABMICR, WBCUA, RBCUA, LABEPIT, MUCUS, BACTERIA  Pertinent Imaging:  No results found for this or any previous visit.  No results found for this or any previous visit.  No results found for this or any previous visit.  No results found for this or any previous visit.  No results found for this or any previous visit.  No results found for this or any previous visit.  No results found for this or any previous visit.  No results found for this or any previous visit.   Assessment & Plan:    1. Incomplete bladder emptying -resolved - Urinalysis, Routine w reflex microscopic - BLADDER SCAN AMB NON-IMAGING  2. OAB (overactive bladder) -Continue mirabegron 25mg  daily   No follow-ups on file.  01/02/2019, MD  [facility] [place]

## 2021-03-14 NOTE — Patient Instructions (Signed)

## 2021-03-14 NOTE — Progress Notes (Signed)
post void residual=42 ?

## 2021-05-04 DIAGNOSIS — K2100 Gastro-esophageal reflux disease with esophagitis, without bleeding: Secondary | ICD-10-CM | POA: Diagnosis not present

## 2021-05-04 DIAGNOSIS — I10 Essential (primary) hypertension: Secondary | ICD-10-CM | POA: Diagnosis not present

## 2021-05-04 DIAGNOSIS — E785 Hyperlipidemia, unspecified: Secondary | ICD-10-CM | POA: Diagnosis not present

## 2021-05-04 DIAGNOSIS — E782 Mixed hyperlipidemia: Secondary | ICD-10-CM | POA: Diagnosis not present

## 2021-05-04 DIAGNOSIS — E7849 Other hyperlipidemia: Secondary | ICD-10-CM | POA: Diagnosis not present

## 2021-05-04 DIAGNOSIS — N182 Chronic kidney disease, stage 2 (mild): Secondary | ICD-10-CM | POA: Diagnosis not present

## 2021-05-04 DIAGNOSIS — E1151 Type 2 diabetes mellitus with diabetic peripheral angiopathy without gangrene: Secondary | ICD-10-CM | POA: Diagnosis not present

## 2021-05-04 DIAGNOSIS — J449 Chronic obstructive pulmonary disease, unspecified: Secondary | ICD-10-CM | POA: Diagnosis not present

## 2021-05-09 DIAGNOSIS — K2100 Gastro-esophageal reflux disease with esophagitis, without bleeding: Secondary | ICD-10-CM | POA: Diagnosis not present

## 2021-05-09 DIAGNOSIS — E1151 Type 2 diabetes mellitus with diabetic peripheral angiopathy without gangrene: Secondary | ICD-10-CM | POA: Diagnosis not present

## 2021-05-09 DIAGNOSIS — I25119 Atherosclerotic heart disease of native coronary artery with unspecified angina pectoris: Secondary | ICD-10-CM | POA: Diagnosis not present

## 2021-05-09 DIAGNOSIS — I10 Essential (primary) hypertension: Secondary | ICD-10-CM | POA: Diagnosis not present

## 2021-05-09 DIAGNOSIS — E7849 Other hyperlipidemia: Secondary | ICD-10-CM | POA: Diagnosis not present

## 2021-06-07 DIAGNOSIS — M7551 Bursitis of right shoulder: Secondary | ICD-10-CM | POA: Diagnosis not present

## 2021-06-07 DIAGNOSIS — Z6824 Body mass index (BMI) 24.0-24.9, adult: Secondary | ICD-10-CM | POA: Diagnosis not present

## 2021-06-07 DIAGNOSIS — M19011 Primary osteoarthritis, right shoulder: Secondary | ICD-10-CM | POA: Diagnosis not present

## 2021-06-20 DIAGNOSIS — Z23 Encounter for immunization: Secondary | ICD-10-CM | POA: Diagnosis not present

## 2021-07-10 DIAGNOSIS — M25511 Pain in right shoulder: Secondary | ICD-10-CM | POA: Diagnosis not present

## 2021-07-10 DIAGNOSIS — Z6824 Body mass index (BMI) 24.0-24.9, adult: Secondary | ICD-10-CM | POA: Diagnosis not present

## 2021-07-30 DIAGNOSIS — M19011 Primary osteoarthritis, right shoulder: Secondary | ICD-10-CM | POA: Diagnosis not present

## 2021-07-31 ENCOUNTER — Other Ambulatory Visit (HOSPITAL_COMMUNITY): Payer: Self-pay | Admitting: Sports Medicine

## 2021-07-31 ENCOUNTER — Other Ambulatory Visit: Payer: Self-pay | Admitting: Sports Medicine

## 2021-07-31 DIAGNOSIS — M19011 Primary osteoarthritis, right shoulder: Secondary | ICD-10-CM

## 2021-08-10 DIAGNOSIS — M25511 Pain in right shoulder: Secondary | ICD-10-CM | POA: Diagnosis not present

## 2021-08-10 DIAGNOSIS — I10 Essential (primary) hypertension: Secondary | ICD-10-CM | POA: Diagnosis not present

## 2021-08-10 DIAGNOSIS — Z6824 Body mass index (BMI) 24.0-24.9, adult: Secondary | ICD-10-CM | POA: Diagnosis not present

## 2021-08-10 DIAGNOSIS — M7551 Bursitis of right shoulder: Secondary | ICD-10-CM | POA: Diagnosis not present

## 2021-08-21 ENCOUNTER — Ambulatory Visit (HOSPITAL_COMMUNITY): Payer: Medicare HMO | Attending: Sports Medicine

## 2021-08-21 ENCOUNTER — Encounter (HOSPITAL_COMMUNITY): Payer: Self-pay

## 2021-08-31 DIAGNOSIS — M79604 Pain in right leg: Secondary | ICD-10-CM | POA: Diagnosis not present

## 2021-08-31 DIAGNOSIS — M19011 Primary osteoarthritis, right shoulder: Secondary | ICD-10-CM | POA: Diagnosis not present

## 2021-08-31 DIAGNOSIS — M25511 Pain in right shoulder: Secondary | ICD-10-CM | POA: Diagnosis not present

## 2021-08-31 DIAGNOSIS — Z6824 Body mass index (BMI) 24.0-24.9, adult: Secondary | ICD-10-CM | POA: Diagnosis not present

## 2021-09-12 ENCOUNTER — Ambulatory Visit: Payer: Medicare Other | Admitting: Urology

## 2021-09-12 DIAGNOSIS — Z6825 Body mass index (BMI) 25.0-25.9, adult: Secondary | ICD-10-CM | POA: Diagnosis not present

## 2021-09-12 DIAGNOSIS — E1151 Type 2 diabetes mellitus with diabetic peripheral angiopathy without gangrene: Secondary | ICD-10-CM | POA: Diagnosis not present

## 2021-09-12 DIAGNOSIS — E785 Hyperlipidemia, unspecified: Secondary | ICD-10-CM | POA: Diagnosis not present

## 2021-09-12 DIAGNOSIS — I10 Essential (primary) hypertension: Secondary | ICD-10-CM | POA: Diagnosis not present

## 2021-09-12 DIAGNOSIS — H109 Unspecified conjunctivitis: Secondary | ICD-10-CM | POA: Diagnosis not present

## 2021-09-17 DIAGNOSIS — Z6825 Body mass index (BMI) 25.0-25.9, adult: Secondary | ICD-10-CM | POA: Diagnosis not present

## 2021-09-17 DIAGNOSIS — I25119 Atherosclerotic heart disease of native coronary artery with unspecified angina pectoris: Secondary | ICD-10-CM | POA: Diagnosis not present

## 2021-09-17 DIAGNOSIS — I10 Essential (primary) hypertension: Secondary | ICD-10-CM | POA: Diagnosis not present

## 2021-09-17 DIAGNOSIS — E1122 Type 2 diabetes mellitus with diabetic chronic kidney disease: Secondary | ICD-10-CM | POA: Diagnosis not present

## 2021-09-17 DIAGNOSIS — F331 Major depressive disorder, recurrent, moderate: Secondary | ICD-10-CM | POA: Diagnosis not present

## 2021-09-17 DIAGNOSIS — E1151 Type 2 diabetes mellitus with diabetic peripheral angiopathy without gangrene: Secondary | ICD-10-CM | POA: Diagnosis not present

## 2021-09-17 DIAGNOSIS — E7849 Other hyperlipidemia: Secondary | ICD-10-CM | POA: Diagnosis not present

## 2021-09-17 DIAGNOSIS — N1831 Chronic kidney disease, stage 3a: Secondary | ICD-10-CM | POA: Diagnosis not present

## 2021-09-19 ENCOUNTER — Ambulatory Visit (HOSPITAL_BASED_OUTPATIENT_CLINIC_OR_DEPARTMENT_OTHER): Payer: Medicare HMO

## 2021-10-01 ENCOUNTER — Encounter (HOSPITAL_COMMUNITY): Payer: Self-pay

## 2021-10-01 ENCOUNTER — Ambulatory Visit (HOSPITAL_COMMUNITY): Payer: Medicare HMO | Attending: Sports Medicine

## 2021-10-17 DIAGNOSIS — N3001 Acute cystitis with hematuria: Secondary | ICD-10-CM | POA: Diagnosis not present

## 2021-10-17 DIAGNOSIS — K529 Noninfective gastroenteritis and colitis, unspecified: Secondary | ICD-10-CM | POA: Diagnosis not present

## 2021-10-17 DIAGNOSIS — Z885 Allergy status to narcotic agent status: Secondary | ICD-10-CM | POA: Diagnosis not present

## 2021-10-17 DIAGNOSIS — R110 Nausea: Secondary | ICD-10-CM | POA: Diagnosis not present

## 2021-10-17 DIAGNOSIS — Z880 Allergy status to penicillin: Secondary | ICD-10-CM | POA: Diagnosis not present

## 2021-10-17 DIAGNOSIS — R001 Bradycardia, unspecified: Secondary | ICD-10-CM | POA: Diagnosis not present

## 2021-10-17 DIAGNOSIS — Z882 Allergy status to sulfonamides status: Secondary | ICD-10-CM | POA: Diagnosis not present

## 2021-10-17 DIAGNOSIS — K8020 Calculus of gallbladder without cholecystitis without obstruction: Secondary | ICD-10-CM | POA: Diagnosis not present

## 2021-10-17 DIAGNOSIS — R4182 Altered mental status, unspecified: Secondary | ICD-10-CM | POA: Diagnosis not present

## 2021-10-17 DIAGNOSIS — R55 Syncope and collapse: Secondary | ICD-10-CM | POA: Diagnosis not present

## 2021-10-17 DIAGNOSIS — K5730 Diverticulosis of large intestine without perforation or abscess without bleeding: Secondary | ICD-10-CM | POA: Diagnosis not present

## 2021-10-17 DIAGNOSIS — Z20822 Contact with and (suspected) exposure to covid-19: Secondary | ICD-10-CM | POA: Diagnosis not present

## 2021-10-17 DIAGNOSIS — I10 Essential (primary) hypertension: Secondary | ICD-10-CM | POA: Diagnosis not present

## 2021-10-17 DIAGNOSIS — R42 Dizziness and giddiness: Secondary | ICD-10-CM | POA: Diagnosis not present

## 2021-10-18 ENCOUNTER — Other Ambulatory Visit: Payer: Self-pay

## 2021-10-18 ENCOUNTER — Observation Stay (HOSPITAL_COMMUNITY)
Admission: EM | Admit: 2021-10-18 | Discharge: 2021-10-20 | Disposition: A | Discharge status: Home / Self Care | Payer: Medicare HMO | Attending: Internal Medicine | Admitting: Internal Medicine

## 2021-10-18 ENCOUNTER — Emergency Department (HOSPITAL_COMMUNITY): Payer: Medicare HMO

## 2021-10-18 ENCOUNTER — Encounter (HOSPITAL_COMMUNITY): Payer: Self-pay | Admitting: *Deleted

## 2021-10-18 DIAGNOSIS — G9341 Metabolic encephalopathy: Principal | ICD-10-CM | POA: Insufficient documentation

## 2021-10-18 DIAGNOSIS — I10 Essential (primary) hypertension: Secondary | ICD-10-CM | POA: Diagnosis not present

## 2021-10-18 DIAGNOSIS — E039 Hypothyroidism, unspecified: Secondary | ICD-10-CM | POA: Diagnosis not present

## 2021-10-18 DIAGNOSIS — E876 Hypokalemia: Secondary | ICD-10-CM | POA: Insufficient documentation

## 2021-10-18 DIAGNOSIS — Z7982 Long term (current) use of aspirin: Secondary | ICD-10-CM | POA: Diagnosis not present

## 2021-10-18 DIAGNOSIS — J45909 Unspecified asthma, uncomplicated: Secondary | ICD-10-CM | POA: Insufficient documentation

## 2021-10-18 DIAGNOSIS — Z79899 Other long term (current) drug therapy: Secondary | ICD-10-CM | POA: Diagnosis not present

## 2021-10-18 DIAGNOSIS — R55 Syncope and collapse: Secondary | ICD-10-CM | POA: Insufficient documentation

## 2021-10-18 DIAGNOSIS — R001 Bradycardia, unspecified: Secondary | ICD-10-CM | POA: Diagnosis not present

## 2021-10-18 DIAGNOSIS — R42 Dizziness and giddiness: Secondary | ICD-10-CM | POA: Insufficient documentation

## 2021-10-18 DIAGNOSIS — R4182 Altered mental status, unspecified: Secondary | ICD-10-CM | POA: Diagnosis not present

## 2021-10-18 DIAGNOSIS — Z951 Presence of aortocoronary bypass graft: Secondary | ICD-10-CM | POA: Diagnosis not present

## 2021-10-18 DIAGNOSIS — N3000 Acute cystitis without hematuria: Secondary | ICD-10-CM | POA: Insufficient documentation

## 2021-10-18 DIAGNOSIS — I2510 Atherosclerotic heart disease of native coronary artery without angina pectoris: Secondary | ICD-10-CM | POA: Diagnosis not present

## 2021-10-18 DIAGNOSIS — J449 Chronic obstructive pulmonary disease, unspecified: Secondary | ICD-10-CM | POA: Diagnosis not present

## 2021-10-18 LAB — CBC WITH DIFFERENTIAL/PLATELET
Abs Immature Granulocytes: 0.02 10*3/uL (ref 0.00–0.07)
Basophils Absolute: 0.1 10*3/uL (ref 0.0–0.1)
Basophils Relative: 1 %
Eosinophils Absolute: 0.1 10*3/uL (ref 0.0–0.5)
Eosinophils Relative: 1 %
HCT: 46.4 % — ABNORMAL HIGH (ref 36.0–46.0)
Hemoglobin: 14.9 g/dL (ref 12.0–15.0)
Immature Granulocytes: 0 %
Lymphocytes Relative: 34 %
Lymphs Abs: 3.1 10*3/uL (ref 0.7–4.0)
MCH: 28.2 pg (ref 26.0–34.0)
MCHC: 32.1 g/dL (ref 30.0–36.0)
MCV: 87.7 fL (ref 80.0–100.0)
Monocytes Absolute: 0.9 10*3/uL (ref 0.1–1.0)
Monocytes Relative: 10 %
Neutro Abs: 4.8 10*3/uL (ref 1.7–7.7)
Neutrophils Relative %: 54 %
Platelets: 407 10*3/uL — ABNORMAL HIGH (ref 150–400)
RBC: 5.29 MIL/uL — ABNORMAL HIGH (ref 3.87–5.11)
RDW: 15 % (ref 11.5–15.5)
WBC: 8.9 10*3/uL (ref 4.0–10.5)
nRBC: 0 % (ref 0.0–0.2)

## 2021-10-18 LAB — URINALYSIS, ROUTINE W REFLEX MICROSCOPIC
Bacteria, UA: NONE SEEN
Bilirubin Urine: NEGATIVE
Glucose, UA: NEGATIVE mg/dL
Ketones, ur: NEGATIVE mg/dL
Leukocytes,Ua: NEGATIVE
Nitrite: NEGATIVE
Protein, ur: NEGATIVE mg/dL
Specific Gravity, Urine: 1.009 (ref 1.005–1.030)
pH: 6 (ref 5.0–8.0)

## 2021-10-18 LAB — COMPREHENSIVE METABOLIC PANEL
ALT: 14 U/L (ref 0–44)
AST: 19 U/L (ref 15–41)
Albumin: 3.9 g/dL (ref 3.5–5.0)
Alkaline Phosphatase: 75 U/L (ref 38–126)
Anion gap: 5 (ref 5–15)
BUN: 12 mg/dL (ref 8–23)
CO2: 31 mmol/L (ref 22–32)
Calcium: 9.6 mg/dL (ref 8.9–10.3)
Chloride: 103 mmol/L (ref 98–111)
Creatinine, Ser: 1.01 mg/dL — ABNORMAL HIGH (ref 0.44–1.00)
GFR, Estimated: 56 mL/min — ABNORMAL LOW (ref >60)
Glucose, Bld: 110 mg/dL — ABNORMAL HIGH (ref 70–99)
Potassium: 3.1 mmol/L — ABNORMAL LOW (ref 3.5–5.1)
Sodium: 139 mmol/L (ref 135–145)
Total Bilirubin: 0.7 mg/dL (ref 0.3–1.2)
Total Protein: 7 g/dL (ref 6.5–8.1)

## 2021-10-18 LAB — LIPASE, BLOOD: Lipase: 18 U/L (ref 11–51)

## 2021-10-18 LAB — TROPONIN I (HIGH SENSITIVITY)
Troponin I (High Sensitivity): 8 ng/L (ref <18)
Troponin I (High Sensitivity): 8 ng/L (ref <18)

## 2021-10-18 LAB — MAGNESIUM: Magnesium: 2.3 mg/dL (ref 1.7–2.4)

## 2021-10-18 LAB — T4, FREE: Free T4: 0.46 ng/dL — ABNORMAL LOW (ref 0.61–1.12)

## 2021-10-18 LAB — TSH: TSH: 40.144 u[IU]/mL — ABNORMAL HIGH (ref 0.350–4.500)

## 2021-10-18 MED ORDER — ENOXAPARIN SODIUM 40 MG/0.4ML IJ SOSY
40.0000 mg | PREFILLED_SYRINGE | INTRAMUSCULAR | Status: DC
  Administered 2021-10-18 – 2021-10-19 (×2): 40 mg via SUBCUTANEOUS
  Filled 2021-10-18: qty 0.4

## 2021-10-18 MED ORDER — LISINOPRIL 10 MG PO TABS
20.0000 mg | ORAL_TABLET | Freq: Every day | ORAL | Status: DC
  Administered 2021-10-18 – 2021-10-20 (×3): 20 mg via ORAL
  Filled 2021-10-18 (×3): qty 2

## 2021-10-18 MED ORDER — TRAZODONE HCL 50 MG PO TABS
50.0000 mg | ORAL_TABLET | Freq: Once | ORAL | Status: AC
Start: 2021-10-18 — End: 2021-10-18
  Administered 2021-10-18: 50 mg via ORAL
  Filled 2021-10-18: qty 1

## 2021-10-18 MED ORDER — ONDANSETRON HCL 4 MG/2ML IJ SOLN
4.0000 mg | Freq: Once | INTRAMUSCULAR | Status: AC
  Administered 2021-10-18: 4 mg via INTRAVENOUS
  Filled 2021-10-18: qty 2

## 2021-10-18 MED ORDER — POTASSIUM CHLORIDE IN NACL 20-0.9 MEQ/L-% IV SOLN: INTRAVENOUS | Status: DC

## 2021-10-18 MED ORDER — AMLODIPINE BESYLATE 5 MG PO TABS
5.0000 mg | ORAL_TABLET | Freq: Every day | ORAL | Status: DC
  Administered 2021-10-18 – 2021-10-20 (×3): 5 mg via ORAL
  Filled 2021-10-18 (×3): qty 1

## 2021-10-18 MED ORDER — ACETAMINOPHEN 325 MG PO TABS: 650.0000 mg | ORAL_TABLET | Freq: Four times a day (QID) | ORAL | Status: DC | PRN

## 2021-10-18 MED ORDER — POLYETHYLENE GLYCOL 3350 17 G PO PACK: 17.0000 g | PACK | Freq: Every day | ORAL | Status: DC | PRN

## 2021-10-18 MED ORDER — PANTOPRAZOLE SODIUM 40 MG PO TBEC
40.0000 mg | DELAYED_RELEASE_TABLET | Freq: Once | ORAL | Status: AC
Start: 2021-10-18 — End: 2021-10-18
  Administered 2021-10-18: 40 mg via ORAL
  Filled 2021-10-18: qty 1

## 2021-10-18 MED ORDER — ONDANSETRON HCL 4 MG/2ML IJ SOLN
4.0000 mg | Freq: Four times a day (QID) | INTRAMUSCULAR | Status: DC | PRN
  Administered 2021-10-19: 4 mg via INTRAVENOUS
  Filled 2021-10-18 (×2): qty 2

## 2021-10-18 MED ORDER — POTASSIUM CHLORIDE 20 MEQ PO PACK
40.0000 meq | PACK | ORAL | Status: AC
  Administered 2021-10-18 (×2): 40 meq via ORAL
  Filled 2021-10-18 (×2): qty 2

## 2021-10-18 MED ORDER — ACETAMINOPHEN 650 MG RE SUPP: 650.0000 mg | Freq: Four times a day (QID) | RECTAL | Status: DC | PRN

## 2021-10-18 MED ORDER — CALCIUM CARBONATE ANTACID 500 MG PO CHEW
400.0000 mg | CHEWABLE_TABLET | Freq: Once | ORAL | Status: AC
  Administered 2021-10-18: 400 mg via ORAL
  Filled 2021-10-18: qty 2

## 2021-10-18 MED ORDER — LEVOTHYROXINE SODIUM 88 MCG PO TABS
88.0000 ug | ORAL_TABLET | Freq: Every day | ORAL | Status: DC
  Administered 2021-10-19 – 2021-10-20 (×2): 88 ug via ORAL
  Filled 2021-10-18 (×2): qty 1

## 2021-10-18 MED ORDER — ONDANSETRON HCL 4 MG PO TABS
4.0000 mg | ORAL_TABLET | Freq: Four times a day (QID) | ORAL | Status: DC | PRN
  Administered 2021-10-18: 4 mg via ORAL
  Filled 2021-10-18: qty 1

## 2021-10-18 MED ORDER — ISOSORBIDE MONONITRATE ER 60 MG PO TB24
30.0000 mg | ORAL_TABLET | Freq: Every day | ORAL | Status: DC
  Administered 2021-10-18 – 2021-10-20 (×3): 30 mg via ORAL
  Filled 2021-10-18 (×3): qty 1

## 2021-10-18 MED ORDER — SODIUM CHLORIDE 0.9 % IV SOLN
1.0000 g | INTRAVENOUS | Status: DC
  Administered 2021-10-18 – 2021-10-19 (×2): 1 g via INTRAVENOUS
  Filled 2021-10-18: qty 10

## 2021-10-18 NOTE — Assessment & Plan Note (Addendum)
EKG shows sinus bradycardia rate of 47.  Heart rate has ranged from 44-50 in the ED.  EGD 08/2020 showed rate of 55 prior to that rates were in the 70s to 80s.  She reports syncopal episodes, dizziness. -Hold bisoprolol for now -Replete K -Check TSH-markedly elevated at 40.144, likely contributing to bradycardia -Obtain updated echocardiogram

## 2021-10-18 NOTE — H&P (Addendum)
History and Physical    Debbie [name] SSN-[id number] DOB: [birth year] DOA: 10/18/2021  PCP: Caryl Bis, MD   Patient coming from: Home  I have personally briefly reviewed patient's old medical records in [place]  Chief Complaint: AMS  HPI: Debbie [name] is a 81 y.o. female with medical history significant for  COPD and asthma CABG, HTN. Patient presented to the ED with complaints of confusion.  She reports yesterday she was sitting down on her recliner and she feels like she passed out.  She states she felt her vision going black, she does not know how long she was out, she did not fall, and she woke up still sitting in the recliner.  Patient lives alone.  So she called EMS and went to [facility], she had CT abdomen and pelvis without contrast, chest x-ray which were unremarkable.  She reports she has been having urinary frequency, but denies dysuria.  At [facility], UA was suggestive of UTI, so she was given a dose of IV ceftriaxone, and discharged with a prescription for Keflex.  Patient reports that she took 3 doses of Keflex last night.  She reports feeling confused today so she came to the ED.  She reports brief left-sided chest pain today in the ED, nonradiating, no difficulty breathing.  She denies prior chest pains with activity or at rest.   Patient reports dizziness when standing over the past 2 days. Patient reports 3-4 daily episodes of vomiting yesterday and today, also reports 2-3 episodes of loose stools.  No abdominal pain. She denies difficulty breathing, no cough no fevers no chills.  ED Course: Temperature 98.  Heart rate 47-58.  Respiratory rate 12-17.  Blood pressure systolic [id number].  Potassium 3.1.  Troponin 8 X 2.  Head CT without acute abnormality. Hospitalist admit for acute metabolic encephalopathy, UTI.  Review of Systems: As per HPI all other systems reviewed and negative.  Past Medical History:  Diagnosis Date   Allergic  rhinitis    Anxiety    Arthritis    Asthma    Bladder spasms    CAD (coronary artery disease)    CABG 2006 [facility]   Carotid artery disease ([place])    Collagen vascular disease ([place])    COPD (chronic obstructive pulmonary disease) ([place])    Cystocele    Essential hypertension    GERD (gastroesophageal reflux disease)    Hyperlipidemia    Hypothyroidism    Osteoporosis    Renal insufficiency    Vaginal vault prolapse     Past Surgical History:  Procedure Laterality Date   ANTERIOR AND POSTERIOR REPAIR N/A 04/06/2013   Procedure: CYSTOCELE REPAIR ;  Surgeon: Reece Packer, MD;  Location: [facility];  Service: Urology;  Laterality: N/A;   CATARACTS     REMOVED   COLONOSCOPY     CORNEA LACERATION REPAIR     CORONARY ARTERY BYPASS GRAFT     X 5 VESSELS   CYSTOSCOPY N/A 04/06/2013   Procedure: CYSTOSCOPY;  Surgeon: Reece Packer, MD;  Location: [facility];  Service: Urology;  Laterality: N/A;   HERNIA REPAIR     SEPTOPLASTY N/A 04/28/2015   Procedure: SEPTOPLASTY;  Surgeon: Ruby Cola, MD;  Location: [place];  Service: ENT;  Laterality: N/A;   SINUS ENDO W/FUSION Bilateral 04/28/2015   Procedure: ENDOSCOPIC SINUS SURGERY WITH NAVIGATION;  Surgeon: Ruby Cola, MD;  Location: [place];  Service: ENT;  Laterality: Bilateral;   TUBAL  LIGATION       reports that she has never smoked. She has never used smokeless tobacco. She reports current alcohol use. She reports that she does not use drugs.  Allergies  Allergen Reactions   Ivp Dye [Iodinated Contrast Media] Swelling    Kidney Dye   Betadine [Povidone Iodine] Rash   Codeine Nausea And Vomiting   Other Other (See Comments)    ALL NARCOTICS   Penicillins Rash    Did it involve swelling of the face/tongue/throat, SOB, or low BP? No Did it involve sudden or severe rash/hives, skin peeling, or any reaction on the inside of your mouth or nose? No Did you need to seek medical attention at a hospital or doctor's office?  No When did it last happen?       If all above answers are "NO", may proceed with cephalosporin use.    Sulfa Antibiotics Rash    Family History  Problem Relation Age of Onset   Cirrhosis Father    Lung disease Father    Diabetes Mellitus II Mother    Heart Problems Mother    Prior to Admission medications   Medication Sig Start Date End Date Taking? Authorizing Provider  acetaminophen (TYLENOL) 325 MG tablet Take 2 tablets (650 mg total) by mouth every 6 (six) hours as needed for mild pain, fever or headache (or Fever >/= 101). 01/03/19  Yes Abhishek Levesque, [place], MD  amLODipine (NORVASC) 5 MG tablet Take 5 mg by mouth daily. 08/13/21  Yes [provider]  aspirin 81 MG tablet Take 1 tablet (81 mg total) by mouth daily with breakfast. 01/03/19  Yes Randee Upchurch, Courage, MD  bisoprolol (ZEBETA) 5 MG tablet Take 0.5 tablets (2.5 mg total) by mouth daily. Patient taking differently: Take 5 mg by mouth daily. 10/19/19  Yes Satira Sark, MD  cephALEXin (KEFLEX) 500 MG capsule Take 500 mg by mouth 3 (three) times daily. 10/17/21  Yes [provider]  cholecalciferol (VITAMIN D) 1000 units tablet Take 1,000 Units by mouth daily.   Yes [provider]  famotidine (PEPCID) 20 MG tablet Take 20 mg by mouth daily. 08/18/18  Yes [provider]  fluticasone (FLONASE) 50 MCG/ACT nasal spray Place 2 sprays into the nose daily as needed for allergies or rhinitis.    Yes [provider]  furosemide (LASIX) 80 MG tablet Take 1 tablet (80 mg total) by mouth daily. 01/03/19  Yes Aaylah Pokorny, Courage, MD  isosorbide mononitrate (IMDUR) 30 MG 24 hr tablet Take 1 tablet (30 mg total) by mouth daily. 11/16/19  Yes Satira Sark, MD  levothyroxine (SYNTHROID) 88 MCG tablet Take 88 mcg by mouth daily. 09/04/18  Yes [provider]  lisinopril (ZESTRIL) 20 MG tablet Take 20 mg by mouth daily. 10/01/21  Yes [provider]  montelukast (SINGULAIR) 10 MG tablet  Take 10 mg by mouth at bedtime.   Yes [provider]  nitroGLYCERIN (NITROSTAT) 0.4 MG SL tablet Place 1 tablet (0.4 mg total) under the tongue every 5 (five) minutes x 3 doses as needed for chest pain (if no relief after 3rd dose, call 911 or proceed to the ED for an evaluation). 08/06/19  Yes Verta Ellen., NP  potassium chloride (MICRO-K) 10 MEQ CR capsule Take 20 mEq by mouth 2 (two) times daily.    Yes [provider]  albuterol (PROVENTIL) (2.5 MG/3ML) 0.083% nebulizer solution Take 3 mLs (2.5 mg total) by nebulization every 6 (six) hours as needed  for wheezing or shortness of breath. Patient not taking: Reported on 10/18/2021 01/03/19   Roxan Hockey, MD  albuterol (VENTOLIN HFA) 108 (90 Base) MCG/ACT inhaler Inhale 2 puffs into the lungs every 6 (six) hours as needed for wheezing or shortness of breath. Patient not taking: Reported on 10/18/2021 01/03/19   Roxan Hockey, MD  ALPRAZolam Duanne Moron) 0.25 MG tablet Take 1 tablet (0.25 mg total) by mouth 2 (two) times daily as needed for anxiety. Patient not taking: Reported on 10/18/2021 11/06/16   Geradine Girt, DO  atorvastatin (LIPITOR) 80 MG tablet Take 1 tablet (80 mg total) by mouth at bedtime. Patient not taking: Reported on 10/18/2021 01/03/19   Roxan Hockey, MD  guaiFENesin (MUCINEX) 600 MG 12 hr tablet Take 1 tablet (600 mg total) by mouth 2 (two) times daily. Patient not taking: Reported on 10/18/2021 01/03/19   Roxan Hockey, MD  mirabegron ER (MYRBETRIQ) 25 MG TB24 tablet Take 1 tablet (25 mg total) by mouth daily. Patient not taking: Reported on 10/18/2021 03/14/21   Cleon Gustin, MD  predniSONE (DELTASONE) 20 MG tablet Take 1 tablet (20 mg total) by mouth daily with breakfast. Patient not taking: Reported on 10/18/2021 01/03/19   Roxan Hockey, MD  umeclidinium-vilanterol (ANORO ELLIPTA) 62.5-25 MCG/INH AEPB Inhale 1 puff into the lungs daily. Patient not taking: Reported on 10/18/2021 01/04/19   Roxan Hockey, MD    Physical Exam: Vitals:   10/18/21 1400 10/18/21 1430 10/18/21 1500 10/18/21 1530  BP: (!) 212/100 (!) 210/86 (!) 192/91 (!) 178/99  Pulse: (!) 47 (!) 48 (!) 50 (!) 54  Resp: 18 12 17 17   Temp:      TempSrc:      SpO2: 96% 97% 95% 95%  Weight:      Height:        Constitutional: NAD, calm, comfortable Vitals:   10/18/21 1400 10/18/21 1430 10/18/21 1500 10/18/21 1530  BP: (!) 212/100 (!) 210/86 (!) 192/91 (!) 178/99  Pulse: (!) 47 (!) 48 (!) 50 (!) 54  Resp: 18 12 17 17   Temp:      TempSrc:      SpO2: 96% 97% 95% 95%  Weight:      Height:       Eyes: PERRL, lids and conjunctivae normal ENMT: Mucous membranes are moist.   Neck: normal, supple, no masses, no thyromegaly Respiratory: clear to auscultation bilaterally, no wheezing, no crackles. Normal respiratory effort. No accessory muscle use.  Cardiovascular: Regular rate and rhythm, no murmurs / rubs / gallops. No extremity edema. 2+ pedal pulses. No carotid bruits.  Abdomen: no tenderness, no masses palpated. No hepatosplenomegaly. Bowel sounds positive.  Musculoskeletal: no clubbing / cyanosis. No joint deformity upper and lower extremities.  Skin: no rashes, lesions, ulcers. No induration Neurologic: no apparent cranial nerve abnormality, 5/5 strength in all extremities.  Psychiatric: On my examination patient is awake alert oriented, able to answer questions appropriately, Normal judgment and insight. Alert and oriented x 3. Normal mood.   Labs on Admission: I have personally reviewed following labs and imaging studies  CBC: Recent Labs  Lab 10/18/21 1145  WBC 8.9  NEUTROABS 4.8  HGB 14.9  HCT 46.4*  MCV 87.7  PLT [id number]   Basic Metabolic Panel: Recent Labs  Lab 10/18/21 1145  NA 139  K 3.1*  CL 103  CO2 31  GLUCOSE 110*  BUN 12  CREATININE 1.01*  CALCIUM 9.6   GFR: Estimated Creatinine Clearance: 35.1 mL/min (A) (by C-G formula  based on SCr of 1.01 mg/dL (H)). Liver Function  Tests: Recent Labs  Lab 10/18/21 1145  AST 19  ALT 14  ALKPHOS 75  BILITOT 0.7  PROT 7.0  ALBUMIN 3.9   Recent Labs  Lab 10/18/21 1145  LIPASE 18   Urine analysis:    Component Value Date/Time   COLORURINE YELLOW 10/18/2021 1124   APPEARANCEUR CLEAR 10/18/2021 1124   APPEARANCEUR Cloudy (A) 03/14/2021 1355   LABSPEC 1.009 10/18/2021 1124   PHURINE 6.0 10/18/2021 1124   GLUCOSEU NEGATIVE 10/18/2021 1124   HGBUR SMALL (A) 10/18/2021 1124   BILIRUBINUR NEGATIVE 10/18/2021 1124   BILIRUBINUR Negative 03/14/2021 1355   KETONESUR NEGATIVE 10/18/2021 1124   PROTEINUR NEGATIVE 10/18/2021 1124   UROBILINOGEN 0.2 09/20/2014 1653   NITRITE NEGATIVE 10/18/2021 1124   LEUKOCYTESUR NEGATIVE 10/18/2021 1124    Radiological Exams on Admission: CT Head Wo Contrast  Result Date: 10/18/2021 CLINICAL DATA:  Altered mental status, dizziness. EXAM: CT HEAD WITHOUT CONTRAST TECHNIQUE: Contiguous axial images were obtained from the base of the skull through the vertex without intravenous contrast. RADIATION DOSE REDUCTION: This exam was performed according to the departmental dose-optimization program which includes automated exposure control, adjustment of the mA and/or kV according to patient size and/or use of iterative reconstruction technique. COMPARISON:  March 31, 2017. FINDINGS: Brain: No evidence of acute infarction, hemorrhage, hydrocephalus, extra-axial collection or mass lesion/mass effect. Vascular: No hyperdense vessel or unexpected calcification. Skull: Normal. Negative for fracture or focal lesion. Sinuses/Orbits: No acute finding. Other: None. IMPRESSION: No acute intracranial abnormality seen. Electronically Signed   By: Marijo Conception M.D.   On: 10/18/2021 11:46    EKG: Independently reviewed.  Sinus rhythm rate 47, QTc 439.  No significant change from prior.  Assessment/Plan Principal Problem:   Acute metabolic encephalopathy Active Problems:   Hypokalemia   CAD  (coronary artery disease)   Essential hypertension   Hypothyroidism   Hx of CABG   Bradycardia   Dizziness   Assessment and Plan: * Acute metabolic encephalopathy Reports confusion, urinary symptoms of frequency.  Also found to have marked hypothyroidism.  UA done yesterday with moderate leukocytes, diagnosed with UTI at [facility], given a dose of ceftriaxone and discharged on Keflex.  Unfortunately urine cultures were not done.  UA today unremarkable.  Rules out for sepsis.  WBC 8.9.  Afebrile.  She also reports some episodes of vomiting and diarrhea.  Abdominal exam is benign. - CT abdomen and pelvis without contrast done yesterday-also without acute abnormality, showed cholelithiasis.  Head CT without acute abnormality. -Continue IV ceftriaxone 1 g daily -Resume Synthroid -Follow-up urine cultures today   Dizziness Reports dizziness when standing, and syncopal episode unwitnessed at home.  In the setting of vomiting and loose stools, bradycardia, and hypothyroidism.  Last echo 09/2019, EF of 65 to 70%. -Hold Lasix -Check orthostatic vitals -Bisoprolol -Restart Synthroid -Obtain updated echocardiogram - N/s + 20 KCl 75cc/hr x 15hrs   Bradycardia EKG shows sinus bradycardia rate of 47.  Heart rate has ranged from 44-50 in the ED.  EGD 08/2020 showed rate of 55 prior to that rates were in the 70s to 80s.  She reports syncopal episodes, dizziness. -Hold bisoprolol for now -Replete K -Check TSH-markedly elevated at 40.144, likely contributing to bradycardia -Obtain updated echocardiogram  Hypokalemia Reports vomiting and diarrhea.  Possible viral gastroenteritis.  She is also on Lasix 60 mg daily. -Replete -Check magnesium 2.3  Hypothyroidism TSH was checked due to bradycardia and is  elevated at 40.144.  Patient tells me she decided to stop taking her Synthroid.  Likely contributing to her bradycardia. -Resume Synthroid at home dose 88 mg daily -Check T4 T3  Hx of  CABG Reports transient episode of chest pain in the ED.  Denies prior chest pains before arrival with activity or at rest.  Troponin 8x2.  EKG unremarkable. -Resume aspirin, statins  Essential hypertension Blood pressure elevated to 213.  Patient did not take her medications this morning. -Serum Norvasc, lisinopril, Imdur -Hold Lasix for now with dizziness and syncope.    DVT prophylaxis: Lovenox Code Status: Full Family Communication: None at bedside Disposition Plan: ~ 1- 2 days Consults called: none Admission status:  obs tele   Author: Bethena Roys, MD 10/18/2021 6:21 PM  For on call review [email or web address].

## 2021-10-18 NOTE — Assessment & Plan Note (Signed)
Blood pressure elevated to 213.  Patient did not take her medications this morning. -Serum Norvasc, lisinopril, Imdur -Hold Lasix for now with dizziness and syncope.

## 2021-10-18 NOTE — Assessment & Plan Note (Signed)
TSH was checked due to bradycardia and is elevated at 40.144.  Patient tells me she decided to stop taking her Synthroid.  Likely contributing to her bradycardia. -Resume Synthroid at home dose 88 mg daily -Check T4 T3

## 2021-10-18 NOTE — Assessment & Plan Note (Addendum)
Reports dizziness when standing, and syncopal episode unwitnessed at home.  In the setting of vomiting and loose stools, bradycardia, and hypothyroidism.  Last echo 09/2019, EF of 65 to 70%. -Hold Lasix -Check orthostatic vitals -Bisoprolol -Restart Synthroid -Obtain updated echocardiogram - N/s + 20 KCl 75cc/hr x 15hrs

## 2021-10-18 NOTE — ED Notes (Signed)
Patient states she feels like she is going to pass out. Patient states nausea and was plain with bradycardia at 45. Gwyndolyn Saxon, Pa made aware; orders obtained.

## 2021-10-18 NOTE — ED Triage Notes (Signed)
Pt states she took 3 keflex pills at one time last night; pt states she is "not in her right mind"; pt was seen at [facility] yesterday and given antibiotics, pt does not know why but her discharge papers states gallstones;

## 2021-10-18 NOTE — Assessment & Plan Note (Signed)
Reports transient episode of chest pain in the ED.  Denies prior chest pains before arrival with activity or at rest.  Troponin 8x2.  EKG unremarkable. -Resume aspirin, statins

## 2021-10-18 NOTE — ED Provider Notes (Signed)
[facility] EMERGENCY DEPARTMENT Provider Note   CSN: [id number] Arrival date & time: 10/18/21  1025     History  Chief Complaint  Patient presents with   Near Syncope    Debbie [name] is a 81 y.o. female.  HPI  With medical history including COPD, GERD, CAD, hypertension, presents with complaints of confusion.  Patient states that she initially went to [facility] yesterday because she felt like she was passing out, she states that she would not off and then wake back up, she states that she was in her recliner had no actual falls known actually witnessed this.  She states that while at the hospital they started her on antibiotics and then sent her home.  She states when she got home she took 3 Keflex and went to bed she states she feels confused today and then needs help.  She states that she had occasional dizziness but denies any change in vision paresthesia or weakness of her lower extremities, she denies any digestion cough general body aches no chest pain no shortness of breath no stomach pains or vomiting.  She endorsed that she has felt nauseous for the last 2 days without any stomach pain has actually had no vomiting.  She is having normal bowel movements she states that she has been urinating more frequently denies dysuria hematuria.  Reviewed patient's chart was seen at [facility] had CT on pelvis which was unremarkable, chest x-ray unremarkable, CBC CMP all were unremarkable.  Patient was diagnosed with UTI given ceftriaxone and discharged home on Keflex.  Home Medications Prior to Admission medications   Medication Sig Start Date End Date Taking? Authorizing Provider  acetaminophen (TYLENOL) 325 MG tablet Take 2 tablets (650 mg total) by mouth every 6 (six) hours as needed for mild pain, fever or headache (or Fever >/= 101). 01/03/19  Yes Emokpae, Courage, MD  amLODipine (NORVASC) 5 MG tablet Take 5 mg by mouth daily. 08/13/21  Yes [provider]  aspirin  81 MG tablet Take 1 tablet (81 mg total) by mouth daily with breakfast. 01/03/19  Yes Emokpae, Courage, MD  bisoprolol (ZEBETA) 5 MG tablet Take 0.5 tablets (2.5 mg total) by mouth daily. Patient taking differently: Take 5 mg by mouth daily. 10/19/19  Yes Jonelle Sidle, MD  cephALEXin (KEFLEX) 500 MG capsule Take 500 mg by mouth 3 (three) times daily. 10/17/21  Yes [provider]  cholecalciferol (VITAMIN D) 1000 units tablet Take 1,000 Units by mouth daily.   Yes [provider]  famotidine (PEPCID) 20 MG tablet Take 20 mg by mouth daily. 08/18/18  Yes [provider]  fluticasone (FLONASE) 50 MCG/ACT nasal spray Place 2 sprays into the nose daily as needed for allergies or rhinitis.    Yes [provider]  furosemide (LASIX) 80 MG tablet Take 1 tablet (80 mg total) by mouth daily. 01/03/19  Yes Emokpae, Courage, MD  isosorbide mononitrate (IMDUR) 30 MG 24 hr tablet Take 1 tablet (30 mg total) by mouth daily. 11/16/19  Yes Jonelle Sidle, MD  levothyroxine (SYNTHROID) 88 MCG tablet Take 88 mcg by mouth daily. 09/04/18  Yes [provider]  lisinopril (ZESTRIL) 20 MG tablet Take 20 mg by mouth daily. 10/01/21  Yes [provider]  montelukast (SINGULAIR) 10 MG tablet Take 10 mg by mouth at bedtime.   Yes [provider]  nitroGLYCERIN (NITROSTAT) 0.4 MG SL tablet Place 1 tablet (0.4 mg total) under the tongue every 5 (five)  minutes x 3 doses as needed for chest pain (if no relief after 3rd dose, call 911 or proceed to the ED for an evaluation). 08/06/19  Yes Netta NeatQuinn, Andrew L Jr., NP  potassium chloride (MICRO-K) 10 MEQ CR capsule Take 20 mEq by mouth 2 (two) times daily.    Yes [provider]  albuterol (PROVENTIL) (2.5 MG/3ML) 0.083% nebulizer solution Take 3 mLs (2.5 mg total) by nebulization every 6 (six) hours as needed for wheezing or shortness of breath. Patient not taking: Reported on 10/18/2021 01/03/19   Shon HaleEmokpae, Courage, MD   albuterol (VENTOLIN HFA) 108 (90 Base) MCG/ACT inhaler Inhale 2 puffs into the lungs every 6 (six) hours as needed for wheezing or shortness of breath. Patient not taking: Reported on 10/18/2021 01/03/19   Shon HaleEmokpae, Courage, MD  ALPRAZolam Prudy Feeler(XANAX) 0.25 MG tablet Take 1 tablet (0.25 mg total) by mouth 2 (two) times daily as needed for anxiety. Patient not taking: Reported on 10/18/2021 11/06/16   Joseph ArtVann, Jessica U, DO  atorvastatin (LIPITOR) 80 MG tablet Take 1 tablet (80 mg total) by mouth at bedtime. Patient not taking: Reported on 10/18/2021 01/03/19   Shon HaleEmokpae, Courage, MD  guaiFENesin (MUCINEX) 600 MG 12 hr tablet Take 1 tablet (600 mg total) by mouth 2 (two) times daily. Patient not taking: Reported on 10/18/2021 01/03/19   Shon HaleEmokpae, Courage, MD  mirabegron ER (MYRBETRIQ) 25 MG TB24 tablet Take 1 tablet (25 mg total) by mouth daily. Patient not taking: Reported on 10/18/2021 03/14/21   Malen GauzeMcKenzie, Patrick L, MD  predniSONE (DELTASONE) 20 MG tablet Take 1 tablet (20 mg total) by mouth daily with breakfast. Patient not taking: Reported on 10/18/2021 01/03/19   Shon HaleEmokpae, Courage, MD  umeclidinium-vilanterol (ANORO ELLIPTA) 62.5-25 MCG/INH AEPB Inhale 1 puff into the lungs daily. Patient not taking: Reported on 10/18/2021 01/04/19   Shon HaleEmokpae, Courage, MD      Allergies    Ivp dye [iodinated contrast media], Betadine [povidone iodine], Codeine, Other, Penicillins, and Sulfa antibiotics    Review of Systems   Review of Systems  Constitutional:  Negative for chills and fever.  Respiratory:  Negative for shortness of breath.   Cardiovascular:  Negative for chest pain.  Gastrointestinal:  Negative for abdominal pain.  Neurological:  Negative for headaches.   Physical Exam Updated Vital Signs BP (!) 178/99   Pulse (!) 54   Temp 98 F (36.7 C) (Oral)   Resp 17   Ht 5\' 2"  (1.575 m)   Wt 59 kg   SpO2 95%   BMI 23.78 kg/m  Physical Exam Vitals and nursing note reviewed.  Constitutional:      General: She is  not in acute distress.    Appearance: She is not ill-appearing.  HENT:     Head: Normocephalic and atraumatic.     Nose: No congestion.     Mouth/Throat:     Mouth: Mucous membranes are moist.     Pharynx: Oropharynx is clear.  Eyes:     Extraocular Movements: Extraocular movements intact.     Conjunctiva/sclera: Conjunctivae normal.     Pupils: Pupils are equal, round, and reactive to light.  Cardiovascular:     Rate and Rhythm: Normal rate and regular rhythm.     Pulses: Normal pulses.     Heart sounds: No murmur heard.   No friction rub. No gallop.  Pulmonary:     Effort: No respiratory distress.     Breath sounds: No wheezing, rhonchi or rales.  Abdominal:  Palpations: Abdomen is soft.     Tenderness: There is no abdominal tenderness. There is no right CVA tenderness or left CVA tenderness.  Musculoskeletal:     Right lower leg: No edema.     Left lower leg: No edema.  Skin:    General: Skin is warm and dry.  Neurological:     Mental Status: She is alert.     GCS: GCS eye subscore is 4. GCS verbal subscore is 5. GCS motor subscore is 6.     Cranial Nerves: Cranial nerves 2-12 are intact. No cranial nerve deficit.     Sensory: Sensation is intact.     Motor: No weakness.     Coordination: Romberg sign negative. Finger-Nose-Finger Test and Heel to [place] Test normal.     Gait: Gait is intact.     Comments: Alert and orient x3, will have brief episodes of confusion, she is able to identify common objects like a cell phone and a pen, cranial nerves II through XII grossly intact no difficulty word finding following two-step commands no real weakness present.  Gait fully intact.  Psychiatric:        Mood and Affect: Mood normal.    ED Results / Procedures / Treatments   Labs (all labs ordered are listed, but only abnormal results are displayed) Labs Reviewed  CBC WITH DIFFERENTIAL/PLATELET - Abnormal; Notable for the following components:      Result Value   RBC 5.29 (*)     HCT 46.4 (*)    Platelets 407 (*)    All other components within normal limits  COMPREHENSIVE METABOLIC PANEL - Abnormal; Notable for the following components:   Potassium 3.1 (*)    Glucose, Bld 110 (*)    Creatinine, Ser 1.01 (*)    GFR, Estimated 56 (*)    All other components within normal limits  URINALYSIS, ROUTINE W REFLEX MICROSCOPIC - Abnormal; Notable for the following components:   Hgb urine dipstick SMALL (*)    All other components within normal limits  URINE CULTURE  LIPASE, BLOOD  MAGNESIUM  TSH  TROPONIN I (HIGH SENSITIVITY)  TROPONIN I (HIGH SENSITIVITY)    EKG None  Radiology CT Head Wo Contrast  Result Date: 10/18/2021 CLINICAL DATA:  Altered mental status, dizziness. EXAM: CT HEAD WITHOUT CONTRAST TECHNIQUE: Contiguous axial images were obtained from the base of the skull through the vertex without intravenous contrast. RADIATION DOSE REDUCTION: This exam was performed according to the departmental dose-optimization program which includes automated exposure control, adjustment of the mA and/or kV according to patient size and/or use of iterative reconstruction technique. COMPARISON:  March 31, 2017. FINDINGS: Brain: No evidence of acute infarction, hemorrhage, hydrocephalus, extra-axial collection or mass lesion/mass effect. Vascular: No hyperdense vessel or unexpected calcification. Skull: Normal. Negative for fracture or focal lesion. Sinuses/Orbits: No acute finding. Other: None. IMPRESSION: No acute intracranial abnormality seen. Electronically Signed   By: Lupita Raider M.D.   On: 10/18/2021 11:46    Procedures Procedures    Medications Ordered in ED Medications  amLODipine (NORVASC) tablet 5 mg (5 mg Oral Given 10/18/21 1427)  isosorbide mononitrate (IMDUR) 24 hr tablet 30 mg (30 mg Oral Given 10/18/21 1427)  lisinopril (ZESTRIL) tablet 20 mg (20 mg Oral Given 10/18/21 1427)  ondansetron (ZOFRAN) injection 4 mg (4 mg Intravenous Given 10/18/21 1221)     ED Course/ Medical Decision Making/ A&P  Medical Decision Making Amount and/or Complexity of Data Reviewed Labs: ordered. Radiology: ordered.  Risk Prescription drug management.   This patient presents to the ED for concern of confusion, this involves an extensive number of treatment options, and is a complaint that carries with it a high risk of complications and morbidity.  The differential diagnosis includes metabolic derailments, CVA, sepsis    Additional history obtained:  Additional history obtained from N/A External records from outside source obtained and reviewed including previous ED discharge, imaging, medication list,   Co morbidities that complicate the patient evaluation  N/A  Social Determinants of Health:  Geriatric    Lab Tests:  I Ordered, and personally interpreted labs.  The pertinent results include: CBC unremarkable, CMP shows potassium 3.1, glucose 110 creatinine 1.01, lipase 18, negative delta troponin, UA is unremarkable.   Imaging Studies ordered:  I ordered imaging studies including CT head I independently visualized and interpreted imaging which showed negative acute findings I agree with the radiologist interpretation   Cardiac Monitoring:  The patient was maintained on a cardiac monitor.  I personally viewed and interpreted the cardiac monitored which showed an underlying rhythm of: Sinus without signs of ischemia  Medicines ordered and prescription drug management:  I ordered medication including N/A I have reviewed the patients home medicines and have made adjustments as needed  Critical Interventions:  N/A   Reevaluation:  Presents with complaints of confusion, on my exam she had intermittent confusion without focal deficits, suspect metabolic encephalopathy secondary due to UTI, will obtain basic lab work-up, CT head and continue to monitor.  Due to the patient's BP was elevated, patient  endorses she did not take any of her blood pressure medications, her heart rate is in the mid 40s, I will withhold her beta-blocker with her other blood pressure medications  Reassessed the patient BP has slowly come down, she still seems slightly confused, will recommend admission for metabolic encephalopathy secondary due to UTI.  Consultations Obtained:  I requested consultation with the hospitalist Dr. Mariea Clonts ,  and discussed lab and imaging findings as well as pertinent plan - they recommend: will admit the patient    Test Considered:  N/A    Rule out low suspicion for internal head bleed and or mass as CT imaging is negative for acute findings.  Low suspicion for CVA she has no focal deficit present my exam.  Low suspicion for hypertensive emergency no evidence of organ damage on exam within lab work.  I have low suspicion for ACS,  EKG without signs of ischemia she has a negative delta troponin.  Low suspicion for heart block/arrhythmias EKG is negative for either.  Low suspicion for dissection of the vertebral or carotid artery as presentation atypical of etiology.  Low suspicion for meningitis as she has no meningeal sign present.     Dispostion and problem list  After consideration of the diagnostic results and the patients response to treatment, I feel that the patent would benefit from admission.  Metabolic encephalopathy-likely secondary due to UTI was given a dose of ceftriaxone yesterday while in the ED and took 1500 mg of Keflex either yesterday or today  will hold antibiotics at this time recommend restarting it tomorrow. Bradycardia-likely patient's baseline but she is complaining of dizziness especially when she goes from sitting to standing position I have suspicion that her low heart rate because of her dizziness/syncope.  Patient is currently on a beta-blocker would recommend discontinuing or lowering the dose.  Final Clinical Impression(s) / ED  Diagnoses Final diagnoses:  Syncope, unspecified syncope type  Metabolic encephalopathy  Acute cystitis without hematuria    Rx / DC Orders ED Discharge Orders     None         Carroll Sage, PA-C 10/18/21 1652    Bethann Berkshire, MD 10/20/21 1552

## 2021-10-18 NOTE — ED Notes (Signed)
Patient complaining of mild chest pain. MD in room at time and aware. Lab currently drawing second troponin. NSR on monitor.

## 2021-10-18 NOTE — Assessment & Plan Note (Signed)
Reports vomiting and diarrhea.  Possible viral gastroenteritis.  She is also on Lasix 60 mg daily. -Replete -Check magnesium 2.3

## 2021-10-18 NOTE — Assessment & Plan Note (Addendum)
Reports confusion, urinary symptoms of frequency.  Also found to have marked hypothyroidism.  UA done yesterday with moderate leukocytes, diagnosed with UTI at [facility], given a dose of ceftriaxone and discharged on Keflex.  Unfortunately urine cultures were not done.  UA today unremarkable.  Rules out for sepsis.  WBC 8.9.  Afebrile.  She also reports some episodes of vomiting and diarrhea.  Abdominal exam is benign. - CT abdomen and pelvis without contrast done yesterday-also without acute abnormality, showed cholelithiasis.  Head CT without acute abnormality. -Continue IV ceftriaxone 1 g daily -Resume Synthroid -Follow-up urine cultures today

## 2021-10-19 ENCOUNTER — Observation Stay (HOSPITAL_BASED_OUTPATIENT_CLINIC_OR_DEPARTMENT_OTHER): Payer: Medicare HMO

## 2021-10-19 DIAGNOSIS — R55 Syncope and collapse: Secondary | ICD-10-CM

## 2021-10-19 DIAGNOSIS — G9341 Metabolic encephalopathy: Secondary | ICD-10-CM | POA: Diagnosis not present

## 2021-10-19 LAB — BASIC METABOLIC PANEL
Anion gap: 7 (ref 5–15)
BUN: 10 mg/dL (ref 8–23)
CO2: 26 mmol/L (ref 22–32)
Calcium: 9.6 mg/dL (ref 8.9–10.3)
Chloride: 108 mmol/L (ref 98–111)
Creatinine, Ser: 0.85 mg/dL (ref 0.44–1.00)
GFR, Estimated: >60 mL/min (ref >60)
Glucose, Bld: 113 mg/dL — ABNORMAL HIGH (ref 70–99)
Potassium: 4.6 mmol/L (ref 3.5–5.1)
Sodium: 141 mmol/L (ref 135–145)

## 2021-10-19 LAB — URINE CULTURE: Culture: NO GROWTH

## 2021-10-19 LAB — ECHOCARDIOGRAM COMPLETE
AR max vel: 2.48 cm2
AV Area VTI: 2.42 cm2
AV Area mean vel: 2.52 cm2
AV Mean grad: 2 mmHg
AV Peak grad: 4.7 mmHg
Ao pk vel: 1.08 m/s
Area-P 1/2: 3.08 cm2
Calc EF: 61.2 %
Height: 62 in
MV VTI: 2.2 cm2
S' Lateral: 2.7 cm
Single Plane A2C EF: 65.4 %
Single Plane A4C EF: 54.4 %
Weight: 2099.2 oz

## 2021-10-19 LAB — CBC
HCT: 41.3 % (ref 36.0–46.0)
Hemoglobin: 13.1 g/dL (ref 12.0–15.0)
MCH: 28.2 pg (ref 26.0–34.0)
MCHC: 31.7 g/dL (ref 30.0–36.0)
MCV: 88.8 fL (ref 80.0–100.0)
Platelets: 368 10*3/uL (ref 150–400)
RBC: 4.65 MIL/uL (ref 3.87–5.11)
RDW: 15.2 % (ref 11.5–15.5)
WBC: 7.8 10*3/uL (ref 4.0–10.5)
nRBC: 0 % (ref 0.0–0.2)

## 2021-10-19 LAB — T3, FREE: T3, Free: 1.6 pg/mL — ABNORMAL LOW (ref 2.0–4.4)

## 2021-10-19 MED ORDER — CALCIUM CARBONATE ANTACID 500 MG PO CHEW
1.0000 | CHEWABLE_TABLET | Freq: Once | ORAL | Status: AC
Start: 2021-10-19 — End: 2021-10-19
  Administered 2021-10-19: 200 mg via ORAL
  Filled 2021-10-19: qty 1

## 2021-10-19 MED ORDER — PROCHLORPERAZINE EDISYLATE 10 MG/2ML IJ SOLN
10.0000 mg | Freq: Once | INTRAMUSCULAR | Status: AC
  Administered 2021-10-19: 10 mg via INTRAVENOUS
  Filled 2021-10-19: qty 2

## 2021-10-19 NOTE — Progress Notes (Signed)
PROGRESS NOTE    Debbie Dusterlizabeth C Blane  WUJ:[id number] DOB: Oct 24, 1940 DOA: 10/18/2021 PCP: Richardean Chimeraaniel, [name] G, MD   Brief Narrative:    Debbie [name] is a 81 y.o. female with medical history significant for  COPD and asthma CABG, HTN. Patient presented to the ED with complaints of confusion.  Patient was admitted with acute metabolic encephalopathy in the setting of UTI as well as some concern for bradycardia and dizziness with noted volume loss in the setting of viral gastroenteritis.  There is also some concern that she may have not been taking her home Synthroid as she should have.  Assessment & Plan:   Principal Problem:   Acute metabolic encephalopathy Active Problems:   Hypothyroidism   Hypokalemia   Bradycardia   Dizziness   CAD (coronary artery disease)   Essential hypertension   Hx of CABG  Assessment and Plan:   Acute metabolic encephalopathy-improved Reports confusion, urinary symptoms of frequency.  Also found to have marked hypothyroidism.  UA done yesterday with moderate leukocytes, [facility], given a dose of ceftriaxone and discharged on Keflex.  Unfortunately urine cultures were not done.  UA today unremarkable.  Rules out for sepsis.  WBC 8.9.  Afebrile.  She also reports some episodes of vomiting and diarrhea.  Abdominal exam is benign. - CT abdomen and pelvis without contrast done yesterday-also without acute abnormality, showed cholelithiasis.  Head CT without acute abnormality. -Continue IV ceftriaxone 1 g daily -Resume Synthroid -Follow-up urine cultures    Dizziness Reports dizziness when standing, and syncopal episode unwitnessed at home.  In the setting of vomiting and loose stools, bradycardia, and hypothyroidism.  Last echo 09/2019, EF of 65 to 70%. -Hold Lasix -Orthostatic vitals-WNL -Bisoprolol -Restart Synthroid -Obtain updated echocardiogram -DC IVF     Bradycardia-improved EKG shows sinus bradycardia rate of 47.   Heart rate has ranged from 44-50 in the ED.  EGD 08/2020 showed rate of 55 prior to that rates were in the 70s to 80s.  She reports syncopal episodes, dizziness. -Hold bisoprolol for now -Replete K -Check TSH-markedly elevated at 40.144, likely contributing to bradycardia -Obtain updated echocardiogram   Hypothyroidism TSH was checked due to bradycardia and is elevated at 40.144.  Patient tells me she decided to stop taking her Synthroid.  Likely contributing to her bradycardia. -Resume Synthroid at home dose 88 mg daily -T4 low   Hx of CABG Reports transient episode of chest pain in the ED.  Denies prior chest pains before arrival with activity or at rest.  Troponin 8x2.  EKG unremarkable. -Resume aspirin, statins   Essential hypertension-controlled Blood pressure elevated to 213.  Patient did not take her medications this morning. -Serum Norvasc, lisinopril, Imdur -Hold Lasix for now with dizziness and syncope.    DVT prophylaxis: Lovenox Code Status: Full Family Communication: None at bedside, lives alone Disposition Plan:  Status is: Observation The patient will require care spanning > 2 midnights and should be moved to inpatient because: Ongoing need for monitoring, IV medications.   Consultants:  None  Procedures:  None  Antimicrobials:  Anti-infectives (From admission, onward)    Start     Dose/Rate Route Frequency Ordered Stop   10/18/21 1830  cefTRIAXone (ROCEPHIN) 1 g in sodium chloride 0.9 % 100 mL IVPB        1 g 200 mL/hr over 30 Minutes Intravenous Every 24 hours 10/18/21 1822         Subjective: Patient seen and evaluated today  with some ongoing nausea this morning.  She denies any dizziness but continues to have some weakness.  No acute concerns or events noted overnight.  Objective: Vitals:   10/18/21 1800 10/18/21 2042 10/19/21 0206 10/19/21 0427  BP: (!) 178/88 131/76 138/74 133/71  Pulse: 62 60 (!) 58 65  Resp: 17 17 17 17   Temp: 98.1 F (36.7  C) 98.2 F (36.8 C) 98 F (36.7 C) 98.4 F (36.9 C)  TempSrc: Oral     SpO2:  95% 92% 93%  Weight: 59.5 kg     Height: 5\' 2"  (1.575 m)       Intake/Output Summary (Last 24 hours) at 10/19/2021 0905 Last data filed at 10/19/2021 0600 Gross per 24 hour  Intake 1168.98 ml  Output 300 ml  Net 868.98 ml   Filed Weights   10/18/21 1037 10/18/21 1800  Weight: 59 kg 59.5 kg    Examination:  General exam: Appears calm and comfortable  Respiratory system: Clear to auscultation. Respiratory effort normal. Cardiovascular system: S1 & S2 heard, RRR.  Gastrointestinal system: Abdomen is soft Central nervous system: Alert and awake Extremities: No edema Skin: No significant lesions noted Psychiatry: Flat affect.    Data Reviewed: I have personally reviewed following labs and imaging studies  CBC: Recent Labs  Lab 10/18/21 1145 10/19/21 0525  WBC 8.9 7.8  NEUTROABS 4.8  --   HGB 14.9 13.1  HCT 46.4* 41.3  MCV 87.7 88.8  PLT 407* 368   Basic Metabolic Panel: Recent Labs  Lab 10/18/21 1145 10/19/21 0525  NA 139 141  K 3.1* 4.6  CL 103 108  CO2 31 26  GLUCOSE 110* 113*  BUN 12 10  CREATININE 1.01* 0.85  CALCIUM 9.6 9.6  MG 2.3  --    GFR: Estimated Creatinine Clearance: 41.8 mL/min (by C-G formula based on SCr of 0.85 mg/dL). Liver Function Tests: Recent Labs  Lab 10/18/21 1145  AST 19  ALT 14  ALKPHOS 75  BILITOT 0.7  PROT 7.0  ALBUMIN 3.9   Recent Labs  Lab 10/18/21 1145  LIPASE 18   No results for input(s): AMMONIA in the last 168 hours. Coagulation Profile: No results for input(s): INR, PROTIME in the last 168 hours. Cardiac Enzymes: No results for input(s): CKTOTAL, CKMB, CKMBINDEX, TROPONINI in the last 168 hours. BNP (last 3 results) No results for input(s): PROBNP in the last 8760 hours. HbA1C: No results for input(s): HGBA1C in the last 72 hours. CBG: No results for input(s): GLUCAP in the last 168 hours. Lipid Profile: No results for  input(s): CHOL, HDL, LDLCALC, TRIG, CHOLHDL, LDLDIRECT in the last 72 hours. Thyroid Function Tests: Recent Labs    10/18/21 1145 10/18/21 1856  TSH 40.144*  --   FREET4  --  0.46*   Anemia Panel: No results for input(s): VITAMINB12, FOLATE, FERRITIN, TIBC, IRON, RETICCTPCT in the last 72 hours. Sepsis Labs: No results for input(s): PROCALCITON, LATICACIDVEN in the last 168 hours.  No results found for this or any previous visit (from the past 240 hour(s)).       Radiology Studies: CT Head Wo Contrast  Result Date: 10/18/2021 CLINICAL DATA:  Altered mental status, dizziness. EXAM: CT HEAD WITHOUT CONTRAST TECHNIQUE: Contiguous axial images were obtained from the base of the skull through the vertex without intravenous contrast. RADIATION DOSE REDUCTION: This exam was performed according to the departmental dose-optimization program which includes automated exposure control, adjustment of the mA and/or kV according to patient size and/or  use of iterative reconstruction technique. COMPARISON:  March 31, 2017. FINDINGS: Brain: No evidence of acute infarction, hemorrhage, hydrocephalus, extra-axial collection or mass lesion/mass effect. Vascular: No hyperdense vessel or unexpected calcification. Skull: Normal. Negative for fracture or focal lesion. Sinuses/Orbits: No acute finding. Other: None. IMPRESSION: No acute intracranial abnormality seen. Electronically Signed   By: Lupita Raider M.D.   On: 10/18/2021 11:46        Scheduled Meds:  amLODipine  5 mg Oral Daily   enoxaparin (LOVENOX) injection  40 mg Subcutaneous Q24H   isosorbide mononitrate  30 mg Oral Daily   levothyroxine  88 mcg Oral Q0600   lisinopril  20 mg Oral Daily   Continuous Infusions:  cefTRIAXone (ROCEPHIN)  IV 1 g (10/18/21 1947)     LOS: 0 days    Time spent: 35 minutes    Calistro Rauf Hoover Brunette, DO [facility]  If 7PM-7AM, please contact night-coverage [email or web address] 10/19/2021, 9:05 AM

## 2021-10-19 NOTE — Care Management Obs Status (Signed)
MEDICARE OBSERVATION STATUS NOTIFICATION   Patient Details  Name: ILEANE SANDO MRN: [id number] Date of Birth: 1941/05/17   Medicare Observation Status Notification Given:  Yes    Corey Harold 10/19/2021, 3:54 PM

## 2021-10-19 NOTE — Progress Notes (Signed)
*  PRELIMINARY RESULTS* Echocardiogram 2D Echocardiogram has been performed.  Carolyne Fiscal 10/19/2021, 11:26 AM

## 2021-10-19 NOTE — Progress Notes (Signed)
  Transition of Care [facility]) Screening Note   Patient Details  Name: Debbie [name] Date of Birth: 08-Jan-1941   Transition of Care [facility]) CM/SW Contact:    Annice Needy, LCSW Phone Number: 10/19/2021, 10:49 AM    Transition of Care Department [facility]) has reviewed patient and no TOC needs have been identified at this time. We will continue to monitor patient advancement through interdisciplinary progression rounds. If new patient transition needs arise, please place a TOC consult.

## 2021-10-20 DIAGNOSIS — G9341 Metabolic encephalopathy: Secondary | ICD-10-CM | POA: Diagnosis not present

## 2021-10-20 LAB — CBC
HCT: 42.3 % (ref 36.0–46.0)
Hemoglobin: 13.4 g/dL (ref 12.0–15.0)
MCH: 27.6 pg (ref 26.0–34.0)
MCHC: 31.7 g/dL (ref 30.0–36.0)
MCV: 87.2 fL (ref 80.0–100.0)
Platelets: 370 10*3/uL (ref 150–400)
RBC: 4.85 MIL/uL (ref 3.87–5.11)
RDW: 14.7 % (ref 11.5–15.5)
WBC: 8.6 10*3/uL (ref 4.0–10.5)
nRBC: 0 % (ref 0.0–0.2)

## 2021-10-20 LAB — BASIC METABOLIC PANEL
Anion gap: 7 (ref 5–15)
BUN: 10 mg/dL (ref 8–23)
CO2: 27 mmol/L (ref 22–32)
Calcium: 9.5 mg/dL (ref 8.9–10.3)
Chloride: 105 mmol/L (ref 98–111)
Creatinine, Ser: 0.91 mg/dL (ref 0.44–1.00)
GFR, Estimated: >60 mL/min (ref >60)
Glucose, Bld: 112 mg/dL — ABNORMAL HIGH (ref 70–99)
Potassium: 4.1 mmol/L (ref 3.5–5.1)
Sodium: 139 mmol/L (ref 135–145)

## 2021-10-20 LAB — MAGNESIUM: Magnesium: 2.3 mg/dL (ref 1.7–2.4)

## 2021-10-20 MED ORDER — MELATONIN 3 MG PO TABS
6.0000 mg | ORAL_TABLET | Freq: Once | ORAL | Status: AC
  Administered 2021-10-20: 6 mg via ORAL
  Filled 2021-10-20: qty 2

## 2021-10-20 MED ORDER — ONDANSETRON HCL 4 MG PO TABS: 4.0000 mg | ORAL_TABLET | Freq: Four times a day (QID) | ORAL | 0 refills | Status: DC | PRN

## 2021-10-20 NOTE — Progress Notes (Signed)
Pt discharged home in stable condition. Discharge instructions given. Script sent to pharmacy of choice. No immediate questions or concerns at this time. DC'd from unit via wheelchair. 

## 2021-10-20 NOTE — Discharge Summary (Signed)
Physician Discharge Summary  ASHAKI FEBRES SSN-[id number] DOB: 10-08-1940 DOA: 10/18/2021  PCP: Caryl Bis, MD  Admit date: 10/18/2021  Discharge date: 10/20/2021  Admitted From:Home  Disposition:  Home  Recommendations for Outpatient Follow-up:  Follow up with PCP in 1-2 weeks Continue home medications as prior and encouraged compliance Zofran given as needed for any nausea or vomiting No further need for antibiotics and no growth on urine cultures  Home Health: None  Equipment/Devices: None  Discharge Condition:Stable  CODE STATUS: Full  Diet recommendation: Heart Healthy  Brief/Interim Summary: SARIN VERRETT is a 81 y.o. female with medical history significant for  COPD and asthma CABG, HTN. Patient presented to the ED with complaints of confusion.  Patient was admitted with acute metabolic encephalopathy in the setting of UTI as well as some concern for bradycardia and dizziness with noted volume loss in the setting of viral gastroenteritis.  There is also some concern that she may have not been taking her home Synthroid as she should have.  She was started on some Rocephin empirically for UTI, but no growth was noted on cultures.  Her gastroenteritis symptoms have improved and she no longer has any nausea or vomiting or diarrhea and is tolerating diet quite well.  She was also monitored carefully on telemetry with no noted bradycardia.  She is overall eager to go home today and states that she is feeling well enough to do so and understands that she needs to be compliant on her home medications and follow-up with PCP.  No other acute events noted.  Discharge Diagnoses:  Principal Problem:   Acute metabolic encephalopathy Active Problems:   Hypothyroidism   Hypokalemia   Bradycardia   Dizziness   CAD (coronary artery disease)   Essential hypertension   Hx of CABG  Principal discharge diagnosis: Acute encephalopathy-multifactorial but primarily metabolic in  the setting of UTI.  Poorly controlled hypothyroidism due to medication noncompliance.  Viral gastroenteritis.  Discharge Instructions  Discharge Instructions     Diet - low sodium heart healthy   Complete by: As directed    Increase activity slowly   Complete by: As directed       Allergies as of 10/20/2021       Reactions   Ivp Dye [iodinated Contrast Media] Swelling   Kidney Dye   Betadine [povidone Iodine] Rash   Codeine Nausea And Vomiting   Other Other (See Comments)   ALL NARCOTICS   Penicillins Rash   Did it involve swelling of the face/tongue/throat, SOB, or low BP? No Did it involve sudden or severe rash/hives, skin peeling, or any reaction on the inside of your mouth or nose? No Did you need to seek medical attention at a hospital or doctor's office? No When did it last happen?       If all above answers are "NO", may proceed with cephalosporin use.   Sulfa Antibiotics Rash        Medication List     STOP taking these medications    ALPRAZolam 0.25 MG tablet Commonly known as: XANAX   cephALEXin 500 MG capsule Commonly known as: KEFLEX   mirabegron ER 25 MG Tb24 tablet Commonly known as: MYRBETRIQ   predniSONE 20 MG tablet Commonly known as: Deltasone       TAKE these medications    acetaminophen 325 MG tablet Commonly known as: TYLENOL Take 2 tablets (650 mg total) by mouth every 6 (six) hours as needed for mild pain, fever or  headache (or Fever >/= 101).   albuterol 108 (90 Base) MCG/ACT inhaler Commonly known as: VENTOLIN HFA Inhale 2 puffs into the lungs every 6 (six) hours as needed for wheezing or shortness of breath.   albuterol (2.5 MG/3ML) 0.083% nebulizer solution Commonly known as: PROVENTIL Take 3 mLs (2.5 mg total) by nebulization every 6 (six) hours as needed for wheezing or shortness of breath.   amLODipine 5 MG tablet Commonly known as: NORVASC Take 5 mg by mouth daily.   aspirin 81 MG tablet Take 1 tablet (81 mg total)  by mouth daily with breakfast.   atorvastatin 80 MG tablet Commonly known as: LIPITOR Take 1 tablet (80 mg total) by mouth at bedtime.   bisoprolol 5 MG tablet Commonly known as: ZEBETA Take 0.5 tablets (2.5 mg total) by mouth daily. What changed: how much to take   cholecalciferol 1000 units tablet Commonly known as: VITAMIN D Take 1,000 Units by mouth daily.   famotidine 20 MG tablet Commonly known as: PEPCID Take 20 mg by mouth daily.   fluticasone 50 MCG/ACT nasal spray Commonly known as: FLONASE Place 2 sprays into the nose daily as needed for allergies or rhinitis.   furosemide 80 MG tablet Commonly known as: LASIX Take 1 tablet (80 mg total) by mouth daily.   guaiFENesin 600 MG 12 hr tablet Commonly known as: MUCINEX Take 1 tablet (600 mg total) by mouth 2 (two) times daily.   isosorbide mononitrate 30 MG 24 hr tablet Commonly known as: IMDUR Take 1 tablet (30 mg total) by mouth daily.   levothyroxine 88 MCG tablet Commonly known as: SYNTHROID Take 88 mcg by mouth daily.   lisinopril 20 MG tablet Commonly known as: ZESTRIL Take 20 mg by mouth daily.   montelukast 10 MG tablet Commonly known as: SINGULAIR Take 10 mg by mouth at bedtime.   nitroGLYCERIN 0.4 MG SL tablet Commonly known as: NITROSTAT Place 1 tablet (0.4 mg total) under the tongue every 5 (five) minutes x 3 doses as needed for chest pain (if no relief after 3rd dose, call 911 or proceed to the ED for an evaluation).   ondansetron 4 MG tablet Commonly known as: ZOFRAN Take 1 tablet (4 mg total) by mouth every 6 (six) hours as needed for nausea.   potassium chloride 10 MEQ CR capsule Commonly known as: MICRO-K Take 20 mEq by mouth 2 (two) times daily.   umeclidinium-vilanterol 62.5-25 MCG/INH Aepb Commonly known as: ANORO ELLIPTA Inhale 1 puff into the lungs daily.        Follow-up Information     Caryl Bis, MD. Schedule an appointment as soon as possible for a visit in 1  week(s).   Specialty: Family Medicine Contact information: [address] [place] [address] [phone]                Allergies  Allergen Reactions   Ivp Dye [Iodinated Contrast Media] Swelling    Kidney Dye   Betadine [Povidone Iodine] Rash   Codeine Nausea And Vomiting   Other Other (See Comments)    ALL NARCOTICS   Penicillins Rash    Did it involve swelling of the face/tongue/throat, SOB, or low BP? No Did it involve sudden or severe rash/hives, skin peeling, or any reaction on the inside of your mouth or nose? No Did you need to seek medical attention at a hospital or doctor's office? No When did it last happen?       If all above  answers are "NO", may proceed with cephalosporin use.    Sulfa Antibiotics Rash    Consultations: None   Procedures/Studies: CT Head Wo Contrast  Result Date: 10/18/2021 CLINICAL DATA:  Altered mental status, dizziness. EXAM: CT HEAD WITHOUT CONTRAST TECHNIQUE: Contiguous axial images were obtained from the base of the skull through the vertex without intravenous contrast. RADIATION DOSE REDUCTION: This exam was performed according to the departmental dose-optimization program which includes automated exposure control, adjustment of the mA and/or kV according to patient size and/or use of iterative reconstruction technique. COMPARISON:  March 31, 2017. FINDINGS: Brain: No evidence of acute infarction, hemorrhage, hydrocephalus, extra-axial collection or mass lesion/mass effect. Vascular: No hyperdense vessel or unexpected calcification. Skull: Normal. Negative for fracture or focal lesion. Sinuses/Orbits: No acute finding. Other: None. IMPRESSION: No acute intracranial abnormality seen. Electronically Signed   By: Marijo Conception M.D.   On: 10/18/2021 11:46   ECHOCARDIOGRAM COMPLETE  Result Date: 10/19/2021    ECHOCARDIOGRAM REPORT   Patient Name:   NYAIRE RIGHETTI Date of Exam: 10/19/2021 Medical Rec #:  [id number]           Height:        62.0 in Accession #:    [id number]          Weight:       131.2 lb Date of Birth:  [birth year]           BSA:          1.598 m Patient Age:    81 years            BP:           133/71 mmHg Patient Gender: F                   HR:           61 bpm. Exam Location:  [facility] Procedure: 2D Echo, Cardiac Doppler and Color Doppler Indications:    Syncope  History:        Patient has prior history of Echocardiogram examinations, most                 recent 10/07/2019. CAD, Prior CABG, COPD, Arrythmias:Bradycardia,                 Signs/Symptoms:Chest Pain, Dyspnea and                 Dizziness/Lightheadedness; Risk Factors:Hypertension and                 Dyslipidemia.  Sonographer:    Wenda Low Referring Phys: [place]  1. Left ventricular ejection fraction, by estimation, is 60 to 65%. The left ventricle has normal function. The left ventricle has no regional wall motion abnormalities. There is moderate asymmetric left ventricular hypertrophy of the basal-septal segment. Left ventricular diastolic parameters are indeterminate.  2. Right ventricular systolic function is normal. The right ventricular size is normal. There is normal pulmonary artery systolic pressure. The estimated right ventricular systolic pressure is 0000000 mmHg.  3. Left atrial size was moderately dilated.  4. The mitral valve is normal in structure. Trivial mitral valve regurgitation. No evidence of mitral stenosis.  5. The aortic valve is tricuspid. Aortic valve regurgitation is not visualized. No aortic stenosis is present.  6. The inferior vena cava is normal in size with greater than 50% respiratory variability, suggesting right atrial pressure of 3 mmHg. FINDINGS  Left Ventricle: Left ventricular ejection  fraction, by estimation, is 60 to 65%. The left ventricle has normal function. The left ventricle has no regional wall motion abnormalities. The left ventricular internal cavity size was normal in size. There is   moderate asymmetric left ventricular hypertrophy of the basal-septal segment. Left ventricular diastolic parameters are indeterminate. Right Ventricle: The right ventricular size is normal. No increase in right ventricular wall thickness. Right ventricular systolic function is normal. There is normal pulmonary artery systolic pressure. The tricuspid regurgitant velocity is 2.58 m/s, and  with an assumed right atrial pressure of 3 mmHg, the estimated right ventricular systolic pressure is 0000000 mmHg. Left Atrium: Left atrial size was moderately dilated. Right Atrium: Right atrial size was normal in size. Pericardium: There is no evidence of pericardial effusion. Mitral Valve: The mitral valve is normal in structure. Trivial mitral valve regurgitation. No evidence of mitral valve stenosis. MV peak gradient, 2.6 mmHg. The mean mitral valve gradient is 1.0 mmHg. Tricuspid Valve: The tricuspid valve is normal in structure. Tricuspid valve regurgitation is trivial. Aortic Valve: The aortic valve is tricuspid. Aortic valve regurgitation is not visualized. No aortic stenosis is present. Aortic valve mean gradient measures 2.0 mmHg. Aortic valve peak gradient measures 4.7 mmHg. Aortic valve area, by VTI measures 2.42 cm. Pulmonic Valve: The pulmonic valve was not well visualized. Pulmonic valve regurgitation is not visualized. Aorta: The aortic root and ascending aorta are structurally normal, with no evidence of dilitation. Venous: The inferior vena cava is normal in size with greater than 50% respiratory variability, suggesting right atrial pressure of 3 mmHg. IAS/Shunts: The interatrial septum was not well visualized.  LEFT VENTRICLE PLAX 2D LVIDd:         4.40 cm     Diastology LVIDs:         2.70 cm     LV e' medial:    5.66 cm/s LV PW:         1.00 cm     LV E/e' medial:  13.9 LV IVS:        1.20 cm     LV e' lateral:   10.20 cm/s LVOT diam:     2.00 cm     LV E/e' lateral: 7.7 LV SV:         67 LV SV Index:   42 LVOT  Area:     3.14 cm  LV Volumes (MOD) LV vol d, MOD A2C: 44.5 ml LV vol d, MOD A4C: 35.1 ml LV vol s, MOD A2C: 15.4 ml LV vol s, MOD A4C: 16.0 ml LV SV MOD A2C:     29.1 ml LV SV MOD A4C:     35.1 ml LV SV MOD BP:      25.2 ml RIGHT VENTRICLE RV Basal diam:  3.60 cm RV Mid diam:    2.90 cm RV S prime:     10.60 cm/s TAPSE (M-mode): 1.9 cm LEFT ATRIUM             Index        RIGHT ATRIUM           Index LA diam:        4.10 cm 2.57 cm/m   RA Area:     16.70 cm LA Vol (A2C):   64.9 ml 40.61 ml/m  RA Volume:   42.50 ml  26.59 ml/m LA Vol (A4C):   69.5 ml 43.49 ml/m LA Biplane Vol: 70.3 ml 43.99 ml/m  AORTIC VALVE  PULMONIC VALVE AV Area (Vmax):    2.48 cm     PV Vmax:       0.93 m/s AV Area (Vmean):   2.52 cm     PV Peak grad:  3.5 mmHg AV Area (VTI):     2.42 cm AV Vmax:           108.00 cm/s AV Vmean:          74.000 cm/s AV VTI:            0.278 m AV Peak Grad:      4.7 mmHg AV Mean Grad:      2.0 mmHg LVOT Vmax:         85.10 cm/s LVOT Vmean:        59.300 cm/s LVOT VTI:          0.214 m LVOT/AV VTI ratio: 0.77  AORTA Ao Root diam: 2.90 cm Ao Asc diam:  3.60 cm MITRAL VALVE               TRICUSPID VALVE MV Area (PHT): 3.08 cm    TR Peak grad:   26.6 mmHg MV Area VTI:   2.20 cm    TR Vmax:        258.00 cm/s MV Peak grad:  2.6 mmHg MV Mean grad:  1.0 mmHg    SHUNTS MV Vmax:       0.80 m/s    Systemic VTI:  0.21 m MV Vmean:      43.5 cm/s   Systemic Diam: 2.00 cm MV Decel Time: 246 msec MV E velocity: 78.80 cm/s MV A velocity: 73.00 cm/s MV E/A ratio:  1.08 Oswaldo Milian MD Electronically signed by Oswaldo Milian MD Signature Date/Time: 10/19/2021/11:35:32 AM    Final      Discharge Exam: Vitals:   10/19/21 2029 10/20/21 0310  BP: (!) 163/82 (!) 168/99  Pulse: 67 64  Resp: 18 18  Temp: 98 F (36.7 C) 98.6 F (37 C)  SpO2: 99% 94%   Vitals:   10/19/21 0427 10/19/21 1410 10/19/21 2029 10/20/21 0310  BP: 133/71 122/62 (!) 163/82 (!) 168/99  Pulse: 65 84 67 64   Resp: 17 16 18 18   Temp: 98.4 F (36.9 C) 98 F (36.7 C) 98 F (36.7 C) 98.6 F (37 C)  TempSrc:  Oral Oral Oral  SpO2: 93% 95% 99% 94%  Weight:      Height:        General: Pt is alert, awake, not in acute distress Cardiovascular: RRR, S1/S2 +, no rubs, no gallops Respiratory: CTA bilaterally, no wheezing, no rhonchi Abdominal: Soft, NT, ND, bowel sounds + Extremities: no edema, no cyanosis    The results of significant diagnostics from this hospitalization (including imaging, microbiology, ancillary and laboratory) are listed below for reference.     Microbiology: Recent Results (from the past 240 hour(s))  Urine Culture     Status: None   Collection Time: 10/18/21 11:24 AM   Specimen: Urine, Clean Catch  Result Value Ref Range Status   Specimen Description   Final    URINE, CLEAN CATCH Performed at [facility], [address], [place], [place] [address]    Special Requests   Final    NONE Performed at [facility], [address], [place], [place] [address]    Culture   Final    NO GROWTH Performed at [facility], [place] [address], [place], [place] [address]    Report  Status 10/19/2021 FINAL  Final     Labs: BNP (last 3 results) No results for input(s): BNP in the last 8760 hours. Basic Metabolic Panel: Recent Labs  Lab 10/18/21 1145 10/19/21 0525 10/20/21 0611  NA 139 141 139  K 3.1* 4.6 4.1  CL 103 108 105  CO2 31 26 27   GLUCOSE 110* 113* 112*  BUN 12 10 10   CREATININE 1.01* 0.85 0.91  CALCIUM 9.6 9.6 9.5  MG 2.3  --  2.3   Liver Function Tests: Recent Labs  Lab 10/18/21 1145  AST 19  ALT 14  ALKPHOS 75  BILITOT 0.7  PROT 7.0  ALBUMIN 3.9   Recent Labs  Lab 10/18/21 1145  LIPASE 18   No results for input(s): AMMONIA in the last 168 hours. CBC: Recent Labs  Lab 10/18/21 1145 10/19/21 0525 10/20/21 0611  WBC 8.9 7.8 8.6  NEUTROABS 4.8  --   --   HGB 14.9 13.1 13.4  HCT 46.4* 41.3 42.3  MCV 87.7 88.8 87.2  PLT 407* 368  370   Cardiac Enzymes: No results for input(s): CKTOTAL, CKMB, CKMBINDEX, TROPONINI in the last 168 hours. BNP: Invalid input(s): POCBNP CBG: No results for input(s): GLUCAP in the last 168 hours. D-Dimer No results for input(s): DDIMER in the last 72 hours. Hgb A1c No results for input(s): HGBA1C in the last 72 hours. Lipid Profile No results for input(s): CHOL, HDL, LDLCALC, TRIG, CHOLHDL, LDLDIRECT in the last 72 hours. Thyroid function studies Recent Labs    10/18/21 1145 10/18/21 1856  TSH 40.144*  --   T3FREE  --  1.6*   Anemia work up No results for input(s): VITAMINB12, FOLATE, FERRITIN, TIBC, IRON, RETICCTPCT in the last 72 hours. Urinalysis    Component Value Date/Time   COLORURINE YELLOW 10/18/2021 1124   APPEARANCEUR CLEAR 10/18/2021 1124   APPEARANCEUR Cloudy (A) 03/14/2021 1355   LABSPEC 1.009 10/18/2021 1124   PHURINE 6.0 10/18/2021 1124   GLUCOSEU NEGATIVE 10/18/2021 1124   HGBUR SMALL (A) 10/18/2021 1124   BILIRUBINUR NEGATIVE 10/18/2021 1124   BILIRUBINUR Negative 03/14/2021 1355   KETONESUR NEGATIVE 10/18/2021 1124   PROTEINUR NEGATIVE 10/18/2021 1124   UROBILINOGEN 0.2 09/20/2014 1653   NITRITE NEGATIVE 10/18/2021 1124   LEUKOCYTESUR NEGATIVE 10/18/2021 1124   Sepsis Labs Invalid input(s): PROCALCITONIN,  WBC,  LACTICIDVEN Microbiology Recent Results (from the past 240 hour(s))  Urine Culture     Status: None   Collection Time: 10/18/21 11:24 AM   Specimen: Urine, Clean Catch  Result Value Ref Range Status   Specimen Description   Final    URINE, CLEAN CATCH Performed at [facility], [address], [place], [place] [address]    Special Requests   Final    NONE Performed at [facility], [address] [place], [place] [address]    Culture   Final    NO GROWTH Performed at [facility], [place] [address] [place],  [address]    Report Status 10/19/2021 FINAL  Final     Time coordinating discharge: 35  minutes  SIGNED:   Rodena Goldmann, DO [facility] 10/20/2021, 9:13 AM  If 7PM-7AM, please contact night-coverage [email or web address]

## 2021-11-19 DIAGNOSIS — E860 Dehydration: Secondary | ICD-10-CM | POA: Diagnosis not present

## 2021-11-19 DIAGNOSIS — E1151 Type 2 diabetes mellitus with diabetic peripheral angiopathy without gangrene: Secondary | ICD-10-CM | POA: Diagnosis not present

## 2021-11-19 DIAGNOSIS — I10 Essential (primary) hypertension: Secondary | ICD-10-CM | POA: Diagnosis not present

## 2021-11-19 DIAGNOSIS — Z6824 Body mass index (BMI) 24.0-24.9, adult: Secondary | ICD-10-CM | POA: Diagnosis not present

## 2021-11-22 ENCOUNTER — Inpatient Hospital Stay (HOSPITAL_COMMUNITY)
Admission: RE | Admit: 2021-11-22 | Discharge: 2021-12-03 | DRG: 246 | Disposition: A | Discharge status: Home / Self Care | Payer: Medicare HMO | Attending: Internal Medicine | Admitting: Internal Medicine

## 2021-11-22 ENCOUNTER — Encounter (HOSPITAL_COMMUNITY): Payer: Self-pay | Admitting: Emergency Medicine

## 2021-11-22 ENCOUNTER — Other Ambulatory Visit: Payer: Self-pay

## 2021-11-22 ENCOUNTER — Emergency Department (HOSPITAL_COMMUNITY): Payer: Medicare HMO

## 2021-11-22 DIAGNOSIS — Y713 Surgical instruments, materials and cardiovascular devices (including sutures) associated with adverse incidents: Secondary | ICD-10-CM | POA: Diagnosis not present

## 2021-11-22 DIAGNOSIS — Z79890 Hormone replacement therapy (postmenopausal): Secondary | ICD-10-CM

## 2021-11-22 DIAGNOSIS — N3081 Other cystitis with hematuria: Secondary | ICD-10-CM | POA: Diagnosis not present

## 2021-11-22 DIAGNOSIS — R319 Hematuria, unspecified: Secondary | ICD-10-CM | POA: Diagnosis present

## 2021-11-22 DIAGNOSIS — I10 Essential (primary) hypertension: Secondary | ICD-10-CM | POA: Diagnosis not present

## 2021-11-22 DIAGNOSIS — Z8489 Family history of other specified conditions: Secondary | ICD-10-CM

## 2021-11-22 DIAGNOSIS — F03A0 Unspecified dementia, mild, without behavioral disturbance, psychotic disturbance, mood disturbance, and anxiety: Secondary | ICD-10-CM | POA: Diagnosis present

## 2021-11-22 DIAGNOSIS — Z882 Allergy status to sulfonamides status: Secondary | ICD-10-CM

## 2021-11-22 DIAGNOSIS — J9811 Atelectasis: Secondary | ICD-10-CM | POA: Diagnosis present

## 2021-11-22 DIAGNOSIS — I97630 Postprocedural hematoma of a circulatory system organ or structure following a cardiac catheterization: Secondary | ICD-10-CM | POA: Diagnosis not present

## 2021-11-22 DIAGNOSIS — I471 Supraventricular tachycardia: Secondary | ICD-10-CM | POA: Diagnosis not present

## 2021-11-22 DIAGNOSIS — I255 Ischemic cardiomyopathy: Secondary | ICD-10-CM | POA: Diagnosis present

## 2021-11-22 DIAGNOSIS — Z888 Allergy status to other drugs, medicaments and biological substances status: Secondary | ICD-10-CM

## 2021-11-22 DIAGNOSIS — N179 Acute kidney failure, unspecified: Secondary | ICD-10-CM | POA: Diagnosis not present

## 2021-11-22 DIAGNOSIS — I5043 Acute on chronic combined systolic (congestive) and diastolic (congestive) heart failure: Secondary | ICD-10-CM | POA: Diagnosis present

## 2021-11-22 DIAGNOSIS — Z6824 Body mass index (BMI) 24.0-24.9, adult: Secondary | ICD-10-CM | POA: Diagnosis not present

## 2021-11-22 DIAGNOSIS — Z880 Allergy status to penicillin: Secondary | ICD-10-CM

## 2021-11-22 DIAGNOSIS — K8020 Calculus of gallbladder without cholecystitis without obstruction: Secondary | ICD-10-CM | POA: Diagnosis not present

## 2021-11-22 DIAGNOSIS — E782 Mixed hyperlipidemia: Secondary | ICD-10-CM | POA: Diagnosis present

## 2021-11-22 DIAGNOSIS — E785 Hyperlipidemia, unspecified: Secondary | ICD-10-CM | POA: Diagnosis not present

## 2021-11-22 DIAGNOSIS — E876 Hypokalemia: Secondary | ICD-10-CM | POA: Diagnosis not present

## 2021-11-22 DIAGNOSIS — Z91041 Radiographic dye allergy status: Secondary | ICD-10-CM | POA: Diagnosis not present

## 2021-11-22 DIAGNOSIS — M1990 Unspecified osteoarthritis, unspecified site: Secondary | ICD-10-CM | POA: Diagnosis present

## 2021-11-22 DIAGNOSIS — I214 Non-ST elevation (NSTEMI) myocardial infarction: Secondary | ICD-10-CM | POA: Diagnosis not present

## 2021-11-22 DIAGNOSIS — M810 Age-related osteoporosis without current pathological fracture: Secondary | ICD-10-CM | POA: Diagnosis present

## 2021-11-22 DIAGNOSIS — N3090 Cystitis, unspecified without hematuria: Secondary | ICD-10-CM | POA: Diagnosis not present

## 2021-11-22 DIAGNOSIS — I110 Hypertensive heart disease with heart failure: Secondary | ICD-10-CM | POA: Diagnosis present

## 2021-11-22 DIAGNOSIS — K219 Gastro-esophageal reflux disease without esophagitis: Secondary | ICD-10-CM | POA: Diagnosis present

## 2021-11-22 DIAGNOSIS — I513 Intracardiac thrombosis, not elsewhere classified: Secondary | ICD-10-CM | POA: Diagnosis not present

## 2021-11-22 DIAGNOSIS — R079 Chest pain, unspecified: Secondary | ICD-10-CM | POA: Insufficient documentation

## 2021-11-22 DIAGNOSIS — E039 Hypothyroidism, unspecified: Secondary | ICD-10-CM | POA: Diagnosis present

## 2021-11-22 DIAGNOSIS — Y839 Surgical procedure, unspecified as the cause of abnormal reaction of the patient, or of later complication, without mention of misadventure at the time of the procedure: Secondary | ICD-10-CM | POA: Diagnosis not present

## 2021-11-22 DIAGNOSIS — I5023 Acute on chronic systolic (congestive) heart failure: Secondary | ICD-10-CM | POA: Diagnosis not present

## 2021-11-22 DIAGNOSIS — J438 Other emphysema: Secondary | ICD-10-CM | POA: Diagnosis not present

## 2021-11-22 DIAGNOSIS — R5381 Other malaise: Secondary | ICD-10-CM | POA: Diagnosis not present

## 2021-11-22 DIAGNOSIS — I209 Angina pectoris, unspecified: Secondary | ICD-10-CM | POA: Diagnosis not present

## 2021-11-22 DIAGNOSIS — R531 Weakness: Secondary | ICD-10-CM

## 2021-11-22 DIAGNOSIS — I2510 Atherosclerotic heart disease of native coronary artery without angina pectoris: Secondary | ICD-10-CM | POA: Diagnosis not present

## 2021-11-22 DIAGNOSIS — I249 Acute ischemic heart disease, unspecified: Secondary | ICD-10-CM | POA: Diagnosis present

## 2021-11-22 DIAGNOSIS — Z833 Family history of diabetes mellitus: Secondary | ICD-10-CM

## 2021-11-22 DIAGNOSIS — Z955 Presence of coronary angioplasty implant and graft: Secondary | ICD-10-CM | POA: Diagnosis not present

## 2021-11-22 DIAGNOSIS — N281 Cyst of kidney, acquired: Secondary | ICD-10-CM | POA: Diagnosis not present

## 2021-11-22 DIAGNOSIS — Z836 Family history of other diseases of the respiratory system: Secondary | ICD-10-CM

## 2021-11-22 DIAGNOSIS — Z79899 Other long term (current) drug therapy: Secondary | ICD-10-CM

## 2021-11-22 DIAGNOSIS — R001 Bradycardia, unspecified: Secondary | ICD-10-CM | POA: Diagnosis present

## 2021-11-22 DIAGNOSIS — K5730 Diverticulosis of large intestine without perforation or abscess without bleeding: Secondary | ICD-10-CM | POA: Diagnosis not present

## 2021-11-22 DIAGNOSIS — Z951 Presence of aortocoronary bypass graft: Secondary | ICD-10-CM

## 2021-11-22 DIAGNOSIS — I25810 Atherosclerosis of coronary artery bypass graft(s) without angina pectoris: Secondary | ICD-10-CM | POA: Diagnosis not present

## 2021-11-22 DIAGNOSIS — J449 Chronic obstructive pulmonary disease, unspecified: Secondary | ICD-10-CM | POA: Diagnosis present

## 2021-11-22 DIAGNOSIS — R339 Retention of urine, unspecified: Secondary | ICD-10-CM | POA: Diagnosis not present

## 2021-11-22 DIAGNOSIS — Z7982 Long term (current) use of aspirin: Secondary | ICD-10-CM

## 2021-11-22 DIAGNOSIS — Z885 Allergy status to narcotic agent status: Secondary | ICD-10-CM

## 2021-11-22 DIAGNOSIS — I724 Aneurysm of artery of lower extremity: Secondary | ICD-10-CM | POA: Diagnosis not present

## 2021-11-22 HISTORY — DX: Non-ST elevation (NSTEMI) myocardial infarction: I21.4

## 2021-11-22 HISTORY — DX: Atherosclerotic heart disease of native coronary artery without angina pectoris: I25.10

## 2021-11-22 LAB — CBC
HCT: 41 % (ref 36.0–46.0)
Hemoglobin: 13.3 g/dL (ref 12.0–15.0)
MCH: 28.4 pg (ref 26.0–34.0)
MCHC: 32.4 g/dL (ref 30.0–36.0)
MCV: 87.4 fL (ref 80.0–100.0)
Platelets: 447 10*3/uL — ABNORMAL HIGH (ref 150–400)
RBC: 4.69 MIL/uL (ref 3.87–5.11)
RDW: 14.9 % (ref 11.5–15.5)
WBC: 8.1 10*3/uL (ref 4.0–10.5)
nRBC: 0 % (ref 0.0–0.2)

## 2021-11-22 LAB — BASIC METABOLIC PANEL
Anion gap: 9 (ref 5–15)
BUN: 13 mg/dL (ref 8–23)
CO2: 27 mmol/L (ref 22–32)
Calcium: 9.2 mg/dL (ref 8.9–10.3)
Chloride: 106 mmol/L (ref 98–111)
Creatinine, Ser: 0.94 mg/dL (ref 0.44–1.00)
GFR, Estimated: >60 mL/min (ref >60)
Glucose, Bld: 122 mg/dL — ABNORMAL HIGH (ref 70–99)
Potassium: 3.4 mmol/L — ABNORMAL LOW (ref 3.5–5.1)
Sodium: 142 mmol/L (ref 135–145)

## 2021-11-22 LAB — HEPATIC FUNCTION PANEL
ALT: 16 U/L (ref 0–44)
AST: 43 U/L — ABNORMAL HIGH (ref 15–41)
Albumin: 3.2 g/dL — ABNORMAL LOW (ref 3.5–5.0)
Alkaline Phosphatase: 60 U/L (ref 38–126)
Bilirubin, Direct: 0.1 mg/dL (ref 0.0–0.2)
Indirect Bilirubin: 0.8 mg/dL (ref 0.3–0.9)
Total Bilirubin: 0.9 mg/dL (ref 0.3–1.2)
Total Protein: 5.6 g/dL — ABNORMAL LOW (ref 6.5–8.1)

## 2021-11-22 LAB — TROPONIN I (HIGH SENSITIVITY)
Troponin I (High Sensitivity): 208 ng/L (ref <18)
Troponin I (High Sensitivity): 420 ng/L (ref <18)

## 2021-11-22 MED ORDER — LEVOTHYROXINE SODIUM 88 MCG PO TABS
88.0000 ug | ORAL_TABLET | Freq: Every day | ORAL | Status: DC
  Administered 2021-11-23 – 2021-12-03 (×10): 88 ug via ORAL
  Filled 2021-11-22 (×12): qty 1

## 2021-11-22 MED ORDER — HEPARIN (PORCINE) 25000 UT/250ML-% IV SOLN
950.0000 [IU]/h | INTRAVENOUS | Status: DC
  Administered 2021-11-22: 850 [IU]/h via INTRAVENOUS
  Filled 2021-11-22: qty 250

## 2021-11-22 MED ORDER — NITROGLYCERIN 0.4 MG SL SUBL: 0.4000 mg | SUBLINGUAL_TABLET | SUBLINGUAL | Status: DC | PRN

## 2021-11-22 MED ORDER — VITAMIN D 25 MCG (1000 UNIT) PO TABS
1000.0000 [IU] | ORAL_TABLET | Freq: Every day | ORAL | Status: DC
  Administered 2021-11-23 – 2021-12-03 (×11): 1000 [IU] via ORAL
  Filled 2021-11-22 (×11): qty 1

## 2021-11-22 MED ORDER — ALPRAZOLAM 0.25 MG PO TABS
0.2500 mg | ORAL_TABLET | Freq: Two times a day (BID) | ORAL | Status: DC | PRN
  Administered 2021-11-22 – 2021-12-02 (×9): 0.25 mg via ORAL
  Filled 2021-11-22 (×12): qty 1

## 2021-11-22 MED ORDER — ZOLPIDEM TARTRATE 5 MG PO TABS
5.0000 mg | ORAL_TABLET | Freq: Every evening | ORAL | Status: DC | PRN
  Administered 2021-11-23 – 2021-11-28 (×3): 5 mg via ORAL
  Filled 2021-11-22 (×3): qty 1

## 2021-11-22 MED ORDER — LISINOPRIL 20 MG PO TABS
20.0000 mg | ORAL_TABLET | Freq: Every day | ORAL | Status: DC
  Administered 2021-11-23: 20 mg via ORAL
  Filled 2021-11-22: qty 1

## 2021-11-22 MED ORDER — HEPARIN BOLUS VIA INFUSION
4000.0000 [IU] | Freq: Once | INTRAVENOUS | Status: AC
  Administered 2021-11-22: 4000 [IU] via INTRAVENOUS

## 2021-11-22 MED ORDER — SODIUM CHLORIDE 0.9% FLUSH: 3.0000 mL | INTRAVENOUS | Status: DC | PRN

## 2021-11-22 MED ORDER — ASPIRIN 81 MG PO TBEC
81.0000 mg | DELAYED_RELEASE_TABLET | Freq: Every day | ORAL | Status: DC
  Filled 2021-11-22 (×2): qty 1

## 2021-11-22 MED ORDER — ACETAMINOPHEN 325 MG PO TABS: 650.0000 mg | ORAL_TABLET | ORAL | Status: DC | PRN

## 2021-11-22 MED ORDER — BISOPROLOL FUMARATE 5 MG PO TABS
5.0000 mg | ORAL_TABLET | Freq: Every day | ORAL | Status: DC
  Administered 2021-11-23 – 2021-11-24 (×2): 5 mg via ORAL
  Filled 2021-11-22 (×3): qty 1

## 2021-11-22 MED ORDER — ASPIRIN 81 MG PO CHEW
81.0000 mg | CHEWABLE_TABLET | ORAL | Status: AC
  Administered 2021-11-23: 81 mg via ORAL
  Filled 2021-11-22: qty 1

## 2021-11-22 MED ORDER — POTASSIUM CHLORIDE CRYS ER 20 MEQ PO TBCR
20.0000 meq | EXTENDED_RELEASE_TABLET | Freq: Two times a day (BID) | ORAL | Status: DC
  Administered 2021-11-22 – 2021-11-23 (×3): 20 meq via ORAL
  Filled 2021-11-22 (×3): qty 1

## 2021-11-22 MED ORDER — SODIUM CHLORIDE 0.9 % WEIGHT BASED INFUSION
3.0000 mL/kg/h | INTRAVENOUS | Status: DC
  Administered 2021-11-23: 3 mL/kg/h via INTRAVENOUS

## 2021-11-22 MED ORDER — PROCHLORPERAZINE EDISYLATE 10 MG/2ML IJ SOLN
10.0000 mg | Freq: Once | INTRAMUSCULAR | Status: AC
  Administered 2021-11-22: 10 mg via INTRAVENOUS
  Filled 2021-11-22: qty 2

## 2021-11-22 MED ORDER — ONDANSETRON HCL 4 MG/2ML IJ SOLN
4.0000 mg | Freq: Once | INTRAMUSCULAR | Status: AC
  Administered 2021-11-22: 4 mg via INTRAVENOUS
  Filled 2021-11-22: qty 2

## 2021-11-22 MED ORDER — FAMOTIDINE 20 MG PO TABS
20.0000 mg | ORAL_TABLET | Freq: Every day | ORAL | Status: DC
Start: 2021-11-23 — End: 2021-11-24
  Administered 2021-11-23 – 2021-11-24 (×2): 20 mg via ORAL
  Filled 2021-11-22 (×2): qty 1

## 2021-11-22 MED ORDER — SODIUM CHLORIDE 0.9% FLUSH
3.0000 mL | Freq: Two times a day (BID) | INTRAVENOUS | Status: DC
  Administered 2021-11-22 – 2021-11-23 (×2): 3 mL via INTRAVENOUS

## 2021-11-22 MED ORDER — MONTELUKAST SODIUM 10 MG PO TABS
10.0000 mg | ORAL_TABLET | Freq: Every day | ORAL | Status: DC
  Administered 2021-11-23 – 2021-12-02 (×9): 10 mg via ORAL
  Filled 2021-11-22 (×9): qty 1

## 2021-11-22 MED ORDER — ONDANSETRON HCL 4 MG/2ML IJ SOLN
4.0000 mg | Freq: Once | INTRAMUSCULAR | Status: AC
Start: 2021-11-22 — End: 2021-11-22
  Administered 2021-11-22: 4 mg via INTRAVENOUS
  Filled 2021-11-22: qty 2

## 2021-11-22 MED ORDER — ONDANSETRON HCL 4 MG/2ML IJ SOLN
4.0000 mg | Freq: Four times a day (QID) | INTRAMUSCULAR | Status: DC | PRN
  Administered 2021-11-23: 4 mg via INTRAVENOUS
  Filled 2021-11-22: qty 2

## 2021-11-22 MED ORDER — AMLODIPINE BESYLATE 5 MG PO TABS
5.0000 mg | ORAL_TABLET | Freq: Every day | ORAL | Status: DC
  Administered 2021-11-23: 5 mg via ORAL
  Filled 2021-11-22: qty 1

## 2021-11-22 MED ORDER — NITROGLYCERIN IN D5W 200-5 MCG/ML-% IV SOLN
5.0000 ug/min | INTRAVENOUS | Status: DC
  Administered 2021-11-22: 5 ug/min via INTRAVENOUS
  Administered 2021-11-23: 130 ug/min via INTRAVENOUS
  Administered 2021-11-23: 100 ug/min via INTRAVENOUS
  Filled 2021-11-22 (×3): qty 250

## 2021-11-22 MED ORDER — FUROSEMIDE 40 MG PO TABS
80.0000 mg | ORAL_TABLET | Freq: Every day | ORAL | Status: DC
  Administered 2021-11-23: 80 mg via ORAL
  Filled 2021-11-22: qty 2

## 2021-11-22 MED ORDER — SODIUM CHLORIDE 0.9 % IV SOLN: 250.0000 mL | INTRAVENOUS | Status: DC | PRN

## 2021-11-22 MED ORDER — SODIUM CHLORIDE 0.9 % WEIGHT BASED INFUSION: 1.0000 mL/kg/h | INTRAVENOUS | Status: DC

## 2021-11-22 MED ORDER — SODIUM CHLORIDE 0.9% FLUSH
3.0000 mL | Freq: Two times a day (BID) | INTRAVENOUS | Status: DC
  Administered 2021-11-22 – 2021-11-28 (×5): 3 mL via INTRAVENOUS

## 2021-11-22 MED ORDER — ATORVASTATIN CALCIUM 80 MG PO TABS
80.0000 mg | ORAL_TABLET | Freq: Every day | ORAL | Status: DC
  Administered 2021-11-23 – 2021-12-02 (×9): 80 mg via ORAL
  Filled 2021-11-22 (×9): qty 1

## 2021-11-22 MED ORDER — DIPHENHYDRAMINE HCL 50 MG/ML IJ SOLN
12.5000 mg | Freq: Once | INTRAMUSCULAR | Status: AC
  Administered 2021-11-22: 12.5 mg via INTRAVENOUS
  Filled 2021-11-22: qty 1

## 2021-11-22 NOTE — ED Notes (Signed)
ED Provider at bedside. 

## 2021-11-22 NOTE — ED Notes (Signed)
Date and time results received: 11/22/21 5:17 PM  (use smartphrase ".now" to insert current time)  Test: Trop Critical Value: 208  Name of Provider Notified: Dr. Effie Shy  Orders Received? Or Actions Taken?: see chart

## 2021-11-22 NOTE — ED Provider Notes (Signed)
[facility] EMERGENCY DEPARTMENT Provider Note   CSN: [id number] Arrival date & time: 11/22/21  1531     History  Chief Complaint  Patient presents with   Chest Pain    Debbie [name] is a 81 y.o. female.  HPI Patient presenting for evaluation of chest pain.  She has known coronary disease, status post CABG.  Last coronary study, 08/25/2019 she had a Lexiscan Myoview MPS that showed risk based on calculated LVEF.  EF was 41%.  There was no ischemia on the exam.  Echocardiogram done 10/07/2019 showed LV EF 65 to 70%.  She has hyperlipidemia, for which she was treated with Lipitor.  Last cardiology evaluation April 2022. Home Medications Prior to Admission medications   Medication Sig Start Date End Date Taking? Authorizing Provider  acetaminophen (TYLENOL) 325 MG tablet Take 2 tablets (650 mg total) by mouth every 6 (six) hours as needed for mild pain, fever or headache (or Fever >/= 101). 01/03/19   Shon Hale, MD  albuterol (PROVENTIL) (2.5 MG/3ML) 0.083% nebulizer solution Take 3 mLs (2.5 mg total) by nebulization every 6 (six) hours as needed for wheezing or shortness of breath. Patient not taking: Reported on 10/18/2021 01/03/19   Shon Hale, MD  albuterol (VENTOLIN HFA) 108 (90 Base) MCG/ACT inhaler Inhale 2 puffs into the lungs every 6 (six) hours as needed for wheezing or shortness of breath. Patient not taking: Reported on 10/18/2021 01/03/19   Shon Hale, MD  amLODipine (NORVASC) 5 MG tablet Take 5 mg by mouth daily. 08/13/21   [provider]  aspirin 81 MG tablet Take 1 tablet (81 mg total) by mouth daily with breakfast. 01/03/19   Mariea Clonts, Courage, MD  atorvastatin (LIPITOR) 80 MG tablet Take 1 tablet (80 mg total) by mouth at bedtime. Patient not taking: Reported on 10/18/2021 01/03/19   Shon Hale, MD  bisoprolol (ZEBETA) 5 MG tablet Take 0.5 tablets (2.5 mg total) by mouth daily. Patient taking differently: Take 5 mg by mouth daily. 10/19/19    Jonelle Sidle, MD  cholecalciferol (VITAMIN D) 1000 units tablet Take 1,000 Units by mouth daily.    [provider]  famotidine (PEPCID) 20 MG tablet Take 20 mg by mouth daily. 08/18/18   [provider]  fluticasone (FLONASE) 50 MCG/ACT nasal spray Place 2 sprays into the nose daily as needed for allergies or rhinitis.     [provider]  furosemide (LASIX) 80 MG tablet Take 1 tablet (80 mg total) by mouth daily. 01/03/19   Shon Hale, MD  guaiFENesin (MUCINEX) 600 MG 12 hr tablet Take 1 tablet (600 mg total) by mouth 2 (two) times daily. Patient not taking: Reported on 10/18/2021 01/03/19   Shon Hale, MD  isosorbide mononitrate (IMDUR) 30 MG 24 hr tablet Take 1 tablet (30 mg total) by mouth daily. 11/16/19   Jonelle Sidle, MD  levothyroxine (SYNTHROID) 88 MCG tablet Take 88 mcg by mouth daily. 09/04/18   [provider]  lisinopril (ZESTRIL) 20 MG tablet Take 20 mg by mouth daily. 10/01/21   [provider]  montelukast (SINGULAIR) 10 MG tablet Take 10 mg by mouth at bedtime.    [provider]  nitroGLYCERIN (NITROSTAT) 0.4 MG SL tablet Place 1 tablet (0.4 mg total) under the tongue every 5 (five) minutes x 3 doses as needed for chest pain (if no relief after 3rd dose, call 911 or proceed to the ED for an evaluation). 08/06/19   Netta Neat., NP  ondansetron (ZOFRAN) 4 MG tablet Take 1 tablet (4 mg total) by mouth every 6 (six) hours as needed for nausea. 10/20/21   [name], Pratik D, DO  potassium chloride (MICRO-K) 10 MEQ CR capsule Take 20 mEq by mouth 2 (two) times daily.     [provider]  umeclidinium-vilanterol (ANORO ELLIPTA) 62.5-25 MCG/INH AEPB Inhale 1 puff into the lungs daily. Patient not taking: Reported on 10/18/2021 01/04/19   Shon Hale, MD      Allergies    Ivp dye [iodinated contrast media], Betadine [povidone iodine], Codeine, Other, Penicillins, and Sulfa antibiotics    Review of Systems    Review of Systems  Physical Exam Updated Vital Signs Ht 5\' 2"  (1.575 m)   Wt 59.5 kg   BMI 23.99 kg/m  Physical Exam  ED Results / Procedures / Treatments   Labs (all labs ordered are listed, but only abnormal results are displayed) Labs Reviewed  BASIC METABOLIC PANEL  CBC  TROPONIN I (HIGH SENSITIVITY)    EKG EKG Interpretation  Date/Time:  Thursday November 22 2021 15:48:41 EDT Ventricular Rate:  58 PR Interval:  132 QRS Duration: 90 QT Interval:  455 QTC Calculation: 447 R Axis:   43 Text Interpretation: Sinus rhythm Probable LVH with secondary repol abnrm ST depression, consider ischemia, diffuse lds Since last tracing Ischemia is new Otherwise no significant change Confirmed by 11-15-1993 [phone]) on 11/22/2021 3:55:47 PM  Radiology No results found.  Procedures Procedures    Medications Ordered in ED Medications - No data to display  ED Course/ Medical Decision Making/ A&P                           Medical Decision Making Patient presenting with waxing waning chest pain, history of coronary disease, no recent events.  Initial EKG is ischemic but does not indicate STEMI.  First troponin is elevated.  Patient was empirically started on heparin and nitroglycerin drips.  She states her pain today has been 8/10 at the worst and on arrival was 4/10.  She was treated by EMS with aspirin and sublingual nitroglycerin.  Problems Addressed: ACS (acute coronary syndrome) [facility]): acute illness or injury with systemic symptoms that poses a threat to life or bodily functions    Details: Requires urgent evaluation by cardiology and possibly catheterization tonight  Amount and/or Complexity of Data Reviewed Independent Historian:     Details: She is a cogent historian Labs: ordered. Radiology: ordered. Discussion of management or test interpretation with external provider(s): Contacted cardiology through [facility] at 5:20 PM.  [facility]. [facility] [place] who accepts patient.  No beds  are currently available so she will be sent to the emergency department at [facility].  I talked to [facility]. [facility] who accepts the patient.  Risk Prescription drug management. Decision regarding hospitalization. Risk Details: Patient with known coronary disease presenting with chest pain, on and off for 2 days, with EKG showing ischemia.  No evidence for ST segment elevation MI.  Initial troponin elevated.  Patient treated with heparin and nitroglycerin in the ED.  She will be transferred to [place] to be seen by cardiology for monitoring and possible cardiac catheterization tonight.  Critical Care Total time providing critical care: 50 minutes           Final Clinical Impression(s) / ED Diagnoses Final diagnoses:  ACS (acute coronary syndrome) (HCC)    Rx / DC Orders ED Discharge Orders  None         Mancel Bale, MD 11/23/21 1024

## 2021-11-22 NOTE — ED Provider Notes (Incomplete)
[facility] EMERGENCY DEPARTMENT Provider Note   CSN: [id number] Arrival date & time: 11/22/21  1531     History {Add pertinent medical, surgical, social history, OB history to HPI:1} Chief Complaint  Patient presents with   Chest Pain    Debbie [name] is a 81 y.o. female.  HPI     Home Medications Prior to Admission medications   Medication Sig Start Date End Date Taking? Authorizing Provider  acetaminophen (TYLENOL) 325 MG tablet Take 2 tablets (650 mg total) by mouth every 6 (six) hours as needed for mild pain, fever or headache (or Fever >/= 101). 01/03/19   Shon Hale, MD  albuterol (PROVENTIL) (2.5 MG/3ML) 0.083% nebulizer solution Take 3 mLs (2.5 mg total) by nebulization every 6 (six) hours as needed for wheezing or shortness of breath. Patient not taking: Reported on 10/18/2021 01/03/19   Shon Hale, MD  albuterol (VENTOLIN HFA) 108 (90 Base) MCG/ACT inhaler Inhale 2 puffs into the lungs every 6 (six) hours as needed for wheezing or shortness of breath. Patient not taking: Reported on 10/18/2021 01/03/19   Shon Hale, MD  amLODipine (NORVASC) 5 MG tablet Take 5 mg by mouth daily. 08/13/21   [provider]  aspirin 81 MG tablet Take 1 tablet (81 mg total) by mouth daily with breakfast. 01/03/19   Mariea Clonts, Courage, MD  atorvastatin (LIPITOR) 80 MG tablet Take 1 tablet (80 mg total) by mouth at bedtime. Patient not taking: Reported on 10/18/2021 01/03/19   Shon Hale, MD  bisoprolol (ZEBETA) 5 MG tablet Take 0.5 tablets (2.5 mg total) by mouth daily. Patient taking differently: Take 5 mg by mouth daily. 10/19/19   Jonelle Sidle, MD  cholecalciferol (VITAMIN D) 1000 units tablet Take 1,000 Units by mouth daily.    [provider]  famotidine (PEPCID) 20 MG tablet Take 20 mg by mouth daily. 08/18/18   [provider]  fluticasone (FLONASE) 50 MCG/ACT nasal spray Place 2 sprays into the nose daily as needed for  allergies or rhinitis.     [provider]  furosemide (LASIX) 80 MG tablet Take 1 tablet (80 mg total) by mouth daily. 01/03/19   Shon Hale, MD  guaiFENesin (MUCINEX) 600 MG 12 hr tablet Take 1 tablet (600 mg total) by mouth 2 (two) times daily. Patient not taking: Reported on 10/18/2021 01/03/19   Shon Hale, MD  isosorbide mononitrate (IMDUR) 30 MG 24 hr tablet Take 1 tablet (30 mg total) by mouth daily. 11/16/19   Jonelle Sidle, MD  levothyroxine (SYNTHROID) 88 MCG tablet Take 88 mcg by mouth daily. 09/04/18   [provider]  lisinopril (ZESTRIL) 20 MG tablet Take 20 mg by mouth daily. 10/01/21   [provider]  montelukast (SINGULAIR) 10 MG tablet Take 10 mg by mouth at bedtime.    [provider]  nitroGLYCERIN (NITROSTAT) 0.4 MG SL tablet Place 1 tablet (0.4 mg total) under the tongue every 5 (five) minutes x 3 doses as needed for chest pain (if no relief after 3rd dose, call 911 or proceed to the ED for an evaluation). 08/06/19   Netta Neat., NP  ondansetron (ZOFRAN) 4 MG tablet Take 1 tablet (4 mg total) by mouth every 6 (six) hours as needed for nausea. 10/20/21   [name], Pratik D, DO  potassium chloride (MICRO-K) 10 MEQ CR capsule Take 20 mEq by mouth 2 (two) times daily.     [provider]  umeclidinium-vilanterol (ANORO ELLIPTA) 62.5-25  MCG/INH AEPB Inhale 1 puff into the lungs daily. Patient not taking: Reported on 10/18/2021 01/04/19   Roxan Hockey, MD      Allergies    Ivp dye [iodinated contrast media], Betadine [povidone iodine], Codeine, Other, Penicillins, and Sulfa antibiotics    Review of Systems   Review of Systems  Physical Exam Updated Vital Signs BP (!) 131/92   Pulse 63   Temp 98 F (36.7 C) (Oral)   Resp (!) 25   Ht 5\' 2"  (1.575 m)   Wt 59.5 kg   SpO2 95%   BMI 23.99 kg/m  Physical Exam  ED Results / Procedures / Treatments   Labs (all labs ordered are listed, but only abnormal results are  displayed) Labs Reviewed  BASIC METABOLIC PANEL - Abnormal; Notable for the following components:      Result Value   Potassium 3.4 (*)    Glucose, Bld 122 (*)    All other components within normal limits  CBC - Abnormal; Notable for the following components:   Platelets 447 (*)    All other components within normal limits  TROPONIN I (HIGH SENSITIVITY) - Abnormal; Notable for the following components:   Troponin I (High Sensitivity) 208 (*)    All other components within normal limits  TROPONIN I (HIGH SENSITIVITY) - Abnormal; Notable for the following components:   Troponin I (High Sensitivity) 420 (*)    All other components within normal limits  TSH  T4, FREE  T3  LIPOPROTEIN A (LPA)  HEPATIC FUNCTION PANEL  BASIC METABOLIC PANEL  LIPID PANEL    EKG EKG Interpretation  Date/Time:  Thursday November 22 2021 15:48:41 EDT Ventricular Rate:  58 PR Interval:  132 QRS Duration: 90 QT Interval:  455 QTC Calculation: 447 R Axis:   43 Text Interpretation: Sinus rhythm Probable LVH with secondary repol abnrm ST depression, consider ischemia, diffuse lds Since last tracing Ischemia is new Otherwise no significant change Confirmed by Daleen Bo [phone]) on 11/22/2021 3:55:47 PM  Radiology DG Chest Port 1 View  Result Date: 11/22/2021 CLINICAL DATA:  Chest pain with weakness. EXAM: PORTABLE CHEST 1 VIEW COMPARISON:  Radiographs 12/31/2018 and 10/17/2021. Abdominal CT 10/17/2021. FINDINGS: 1644 hours. The heart size and mediastinal contours are stable status post median sternotomy. There is aortic atherosclerosis and a small hiatal hernia. Lower lung volumes with mildly increased patchy opacities at both lung bases, likely reflecting atelectasis. No confluent airspace opacity, significant pleural effusion or pneumothorax. No acute osseous findings are evident. Glenohumeral degenerative changes bilaterally and a thoracolumbar scoliosis. Telemetry leads overlie the chest. IMPRESSION: 1. Lower  lung volumes with probable resulting mild bibasilar atelectasis. No confluent airspace opacity or significant pleural effusion. 2. Aortic atherosclerosis post median sternotomy. Electronically Signed   By: Richardean Sale M.D.   On: 11/22/2021 16:58    Procedures Procedures  {Document cardiac monitor, telemetry assessment procedure when appropriate:1}  Medications Ordered in ED Medications  nitroGLYCERIN 50 mg in dextrose 5 % 250 mL (0.2 mg/mL) infusion (80 mcg/min Intravenous Rate/Dose Change 11/22/21 2224)  heparin ADULT infusion 100 units/mL (25000 units/2103mL) (850 Units/hr Intravenous New Bag/Given 11/22/21 1638)  nitroGLYCERIN (NITROSTAT) SL tablet 0.4 mg (has no administration in time range)  acetaminophen (TYLENOL) tablet 650 mg (has no administration in time range)  ondansetron (ZOFRAN) injection 4 mg (has no administration in time range)  zolpidem (AMBIEN) tablet 5 mg (has no administration in time range)  sodium chloride flush (NS) 0.9 % injection 3 mL (3 mLs Intravenous  Given 11/22/21 2223)  sodium chloride flush (NS) 0.9 % injection 3 mL (has no administration in time range)  0.9 %  sodium chloride infusion (has no administration in time range)  ALPRAZolam (XANAX) tablet 0.25 mg (has no administration in time range)  amLODipine (NORVASC) tablet 5 mg (has no administration in time range)  aspirin EC tablet 81 mg (has no administration in time range)  atorvastatin (LIPITOR) tablet 80 mg (has no administration in time range)  bisoprolol (ZEBETA) tablet 5 mg (has no administration in time range)  cholecalciferol (VITAMIN D3) tablet 1,000 Units (has no administration in time range)  famotidine (PEPCID) tablet 20 mg (has no administration in time range)  furosemide (LASIX) tablet 80 mg (has no administration in time range)  levothyroxine (SYNTHROID) tablet 88 mcg (has no administration in time range)  lisinopril (ZESTRIL) tablet 20 mg (has no administration in time range)  montelukast  (SINGULAIR) tablet 10 mg (has no administration in time range)  potassium chloride SA (KLOR-CON M) CR tablet 20 mEq (20 mEq Oral Given 11/22/21 2127)  sodium chloride flush (NS) 0.9 % injection 3 mL (3 mLs Intravenous Given 11/22/21 2223)  sodium chloride flush (NS) 0.9 % injection 3 mL (has no administration in time range)  0.9 %  sodium chloride infusion (has no administration in time range)  aspirin chewable tablet 81 mg (has no administration in time range)  0.9% sodium chloride infusion (has no administration in time range)    Followed by  0.9% sodium chloride infusion (has no administration in time range)  heparin bolus via infusion 4,000 Units (4,000 Units Intravenous Bolus from Bag 11/22/21 1638)  ondansetron (ZOFRAN) injection 4 mg (4 mg Intravenous Given 11/22/21 1637)  ondansetron (ZOFRAN) injection 4 mg (4 mg Intravenous Given 11/22/21 1821)  prochlorperazine (COMPAZINE) injection 10 mg (10 mg Intravenous Given 11/22/21 1933)  diphenhydrAMINE (BENADRYL) injection 12.5 mg (12.5 mg Intravenous Given 11/22/21 1932)    ED Course/ Medical Decision Making/ A&P                           Medical Decision Making Amount and/or Complexity of Data Reviewed Labs: ordered. Radiology: ordered.  Risk Prescription drug management. Decision regarding hospitalization.   ***  {Document critical care time when appropriate:1} {Document review of labs and clinical decision tools ie heart score, Chads2Vasc2 etc:1}  {Document your independent review of radiology images, and any outside records:1} {Document your discussion with family members, caretakers, and with consultants:1} {Document social determinants of health affecting pt's care:1} {Document your decision making why or why not admission, treatments were needed:1} Final Clinical Impression(s) / ED Diagnoses Final diagnoses:  ACS (acute coronary syndrome) (HCC)    Rx / DC Orders ED Discharge Orders     None

## 2021-11-22 NOTE — H&P (Signed)
ADMISSION HISTORY & PHYSICAL  Patient Name: Debbie [name] Date of Encounter: 11/22/2021 Primary Care Physician: Richardean Chimera, MD Cardiologist: [name], MD  Chief Complaint   Chest pain  Patient Profile   81 yo female with a history of CABG, [facility] and found to have NSTEMI  HPI   This is a 81 y.o. female with a past medical history significant for coronary artery disease status post CABG in 2006 in [place], hypertension, dyslipidemia, COPD and other medical problems, presented to [facility] with on and off chest pain for the past day.  Initial EKG which was personally reviewed did demonstrate diffuse ST segment depression in the inferior and lateral leads.  Initial troponin was elevated at 208 and subsequent troponin increased further to 420.  She was noted to be mildly hypokalemic with potassium of 3.4.  Renal function is normal.  She was started on heparin and given nitroglycerin.  Chest pain had decreased from 9-10 out of 10 down to 4 out of 10 on IV nitroglycerin.  Chest x-ray showed mild bibasilar atelectasis.  She had a recent echo about 1 month ago at [facility] which showed EF 60 to 65%, moderate basal septal hypertrophy, normal RVSP, moderate left atrial enlargement and no other significant findings.  Her last stress test was in April 2021 which was negative for ischemia.  Forted LVEF on Myoview was 41%, however follow-up echo in 2021 showed that her LVEF was normal.  PMHx   Past Medical History:  Diagnosis Date   Allergic rhinitis    Anxiety    Arthritis    Asthma    Bladder spasms    CAD (coronary artery disease)    CABG 2006 [facility]   Carotid artery disease (HCC)    Collagen vascular disease (HCC)    COPD (chronic obstructive pulmonary disease) (HCC)    Cystocele    Essential hypertension    GERD (gastroesophageal reflux disease)    Hyperlipidemia    Hypothyroidism     Osteoporosis    Renal insufficiency    Vaginal vault prolapse     Past Surgical History:  Procedure Laterality Date   ANTERIOR AND POSTERIOR REPAIR N/A 04/06/2013   Procedure: CYSTOCELE REPAIR ;  Surgeon: Martina Sinner, MD;  Location: [facility] ORS;  Service: Urology;  Laterality: N/A;   CATARACTS     REMOVED   COLONOSCOPY     CORNEA LACERATION REPAIR     CORONARY ARTERY BYPASS GRAFT     X 5 VESSELS   CYSTOSCOPY N/A 04/06/2013   Procedure: CYSTOSCOPY;  Surgeon: Martina Sinner, MD;  Location: [facility]S;  Service: Urology;  Laterality: N/A;   HERNIA REPAIR     SEPTOPLASTY N/A 04/28/2015   Procedure: SEPTOPLASTY;  Surgeon: Melvenia Beam, MD;  Location: [facility];  Service: ENT;  Laterality: N/A;   SINUS ENDO W/FUSION Bilateral 04/28/2015   Procedure: ENDOSCOPIC SINUS SURGERY WITH NAVIGATION;  Surgeon: Melvenia Beam, MD;  Location: [facility] OR;  Service: ENT;  Laterality: Bilateral;   TUBAL LIGATION      FAMHx   Family History  Problem Relation Age of Onset   Cirrhosis Father    Lung disease Father    Diabetes Mellitus II Mother    Heart Problems Mother     SOCHx    reports that she has never smoked. She has never used smokeless tobacco. She reports current alcohol use. She reports that she does  not use drugs.  Outpatient Medications   No current facility-administered medications on file prior to encounter.   Current Outpatient Medications on File Prior to Encounter  Medication Sig Dispense Refill   acetaminophen (TYLENOL) 325 MG tablet Take 2 tablets (650 mg total) by mouth every 6 (six) hours as needed for mild pain, fever or headache (or Fever >/= 101). 12 tablet 2   albuterol (PROVENTIL) (2.5 MG/3ML) 0.083% nebulizer solution Take 3 mLs (2.5 mg total) by nebulization every 6 (six) hours as needed for wheezing or shortness of breath. (Patient not taking: Reported on 10/18/2021) 75 mL 12   albuterol (VENTOLIN HFA) 108 (90 Base) MCG/ACT inhaler Inhale 2 puffs into the lungs every 6 (six)  hours as needed for wheezing or shortness of breath. (Patient not taking: Reported on 10/18/2021) 18 g 1   amLODipine (NORVASC) 5 MG tablet Take 5 mg by mouth daily.     aspirin 81 MG tablet Take 1 tablet (81 mg total) by mouth daily with breakfast. 30 tablet 1   atorvastatin (LIPITOR) 80 MG tablet Take 1 tablet (80 mg total) by mouth at bedtime. (Patient not taking: Reported on 10/18/2021) 30 tablet 3   bisoprolol (ZEBETA) 5 MG tablet Take 0.5 tablets (2.5 mg total) by mouth daily. (Patient taking differently: Take 5 mg by mouth daily.) 45 tablet 3   cholecalciferol (VITAMIN D) 1000 units tablet Take 1,000 Units by mouth daily.     famotidine (PEPCID) 20 MG tablet Take 20 mg by mouth daily.     fluticasone (FLONASE) 50 MCG/ACT nasal spray Place 2 sprays into the nose daily as needed for allergies or rhinitis.      furosemide (LASIX) 80 MG tablet Take 1 tablet (80 mg total) by mouth daily. 30 tablet 3   guaiFENesin (MUCINEX) 600 MG 12 hr tablet Take 1 tablet (600 mg total) by mouth 2 (two) times daily. (Patient not taking: Reported on 10/18/2021) 20 tablet 0   isosorbide mononitrate (IMDUR) 30 MG 24 hr tablet Take 1 tablet (30 mg total) by mouth daily. 90 tablet 2   levothyroxine (SYNTHROID) 88 MCG tablet Take 88 mcg by mouth daily.     lisinopril (ZESTRIL) 20 MG tablet Take 20 mg by mouth daily.     montelukast (SINGULAIR) 10 MG tablet Take 10 mg by mouth at bedtime.     nitroGLYCERIN (NITROSTAT) 0.4 MG SL tablet Place 1 tablet (0.4 mg total) under the tongue every 5 (five) minutes x 3 doses as needed for chest pain (if no relief after 3rd dose, call 911 or proceed to the ED for an evaluation). 25 tablet 2   ondansetron (ZOFRAN) 4 MG tablet Take 1 tablet (4 mg total) by mouth every 6 (six) hours as needed for nausea. 20 tablet 0   potassium chloride (MICRO-K) 10 MEQ CR capsule Take 20 mEq by mouth 2 (two) times daily.      umeclidinium-vilanterol (ANORO ELLIPTA) 62.5-25 MCG/INH AEPB Inhale 1 puff into  the lungs daily. (Patient not taking: Reported on 10/18/2021) 60 each 3    Inpatient Medications    Scheduled Meds:   Continuous Infusions:  heparin 850 Units/hr (11/22/21 1638)   nitroGLYCERIN 65 mcg/min (11/22/21 1936)    PRN Meds:    ALLERGIES   Allergies  Allergen Reactions   Ivp Dye [Iodinated Contrast Media] Swelling    Kidney Dye   Betadine [Povidone Iodine] Rash   Codeine Nausea And Vomiting   Other Other (See Comments)    ALL  NARCOTICS   Penicillins Rash    Did it involve swelling of the face/tongue/throat, SOB, or low BP? No Did it involve sudden or severe rash/hives, skin peeling, or any reaction on the inside of your mouth or nose? No Did you need to seek medical attention at a hospital or doctor's office? No When did it last happen?       If all above answers are "NO", may proceed with cephalosporin use.    Sulfa Antibiotics Rash    ROS   Pertinent items noted in HPI and remainder of comprehensive ROS otherwise negative.  Vitals   Vitals:   11/22/21 1728 11/22/21 1815 11/22/21 1915 11/22/21 1916  BP: 126/81 (!) 157/95 (!) 151/86   Pulse: (!) 59 78 62   Resp: 16 16 (!) 21   Temp:    98 F (36.7 C)  TempSrc:    Oral  SpO2: 95% 95% 96%   Weight:      Height:        Intake/Output Summary (Last 24 hours) at 11/22/2021 1959 Last data filed at 11/22/2021 1807 Gross per 24 hour  Intake 7.34 ml  Output --  Net 7.34 ml   Filed Weights   11/22/21 1550  Weight: 59.5 kg    Physical Exam   General appearance: no distress and somnolent, was sleeping when I entered the room and then complained of chest pain when I awakened her Neck: no carotid bruit, no JVD, and thyroid not enlarged, symmetric, no tenderness/mass/nodules Lungs: clear to auscultation bilaterally Heart: regular rate and rhythm Abdomen: soft, non-tender; bowel sounds normal; no masses,  no organomegaly Extremities: extremities normal, atraumatic, no cyanosis or edema and varicose veins  noted Pulses: 2+ and symmetric Skin: Skin color, texture, turgor normal. No rashes or lesions Neurologic: Grossly normal Psych: Pleasant  Labs   Results for orders placed or performed during the hospital encounter of 11/22/21 (from the past 48 hour(s))  Basic metabolic panel     Status: Abnormal   Collection Time: 11/22/21  3:52 PM  Result Value Ref Range   Sodium 142 135 - 145 mmol/L   Potassium 3.4 (L) 3.5 - 5.1 mmol/L   Chloride 106 98 - 111 mmol/L   CO2 27 22 - 32 mmol/L   Glucose, Bld 122 (H) 70 - 99 mg/dL    Comment: Glucose reference range applies only to samples taken after fasting for at least 8 hours.   BUN 13 8 - 23 mg/dL   Creatinine, Ser 6.31 0.44 - 1.00 mg/dL   Calcium 9.2 8.9 - 49.7 mg/dL   GFR, Estimated >02 >63 mL/min    Comment: (NOTE) Calculated using the CKD-EPI Creatinine Equation (2021)    Anion gap 9 5 - 15    Comment: Performed at [facility], [address] [place], [place] [address]  CBC     Status: Abnormal   Collection Time: 11/22/21  3:52 PM  Result Value Ref Range   WBC 8.1 4.0 - 10.5 K/uL   RBC 4.69 3.87 - 5.11 MIL/uL   Hemoglobin 13.3 12.0 - 15.0 g/dL   HCT 50.2 77.4 - 12.8 %   MCV 87.4 80.0 - 100.0 fL   MCH 28.4 26.0 - 34.0 pg   MCHC 32.4 30.0 - 36.0 g/dL   RDW 78.6 76.7 - 20.9 %   Platelets 447 (H) 150 - 400 K/uL   nRBC 0.0 0.0 - 0.2 %    Comment: Performed at [facility], [address], [place],  [place] [address]  Troponin I (High Sensitivity)     Status: Abnormal   Collection Time: 11/22/21  3:52 PM  Result Value Ref Range   Troponin I (High Sensitivity) 208 (HH) <18 ng/L    Comment: CRITICAL RESULT CALLED TO, READ BACK BY AND VERIFIED WITH: DOSS,M ON 11/22/21 AT 1715 BY LOY,C (NOTE) Elevated high sensitivity troponin I (hsTnI) values and significant  changes across serial measurements may suggest ACS but many other  chronic and acute conditions are known to elevate hsTnI results.  Refer to the Links section for chest pain  algorithms and additional  guidance. Performed at [facility], [address] [place], [place] [address]   Troponin I (High Sensitivity)     Status: Abnormal   Collection Time: 11/22/21  5:52 PM  Result Value Ref Range   Troponin I (High Sensitivity) 420 (HH) <18 ng/L    Comment: DELTA CHECK NOTED CRITICAL RESULT CALLED TO, READ BACK BY AND VERIFIED WITH: TURNER,C ON 11/22/21 AT 1935 BY LOY,C (NOTE) Elevated high sensitivity troponin I (hsTnI) values and significant  changes across serial measurements may suggest ACS but many other  chronic and acute conditions are known to elevate hsTnI results.  Refer to the Links section for chest pain algorithms and additional  guidance. Performed at [facility], [address] [place], [place] [address]     ECG   Sinus rhythm with LVH and inferior and anterolateral ST depression- Personally Reviewed  Telemetry   Sinus rhythm with occasional PACs- Personally Reviewed  Radiology   DG Chest Port 1 View  Result Date: 11/22/2021 CLINICAL DATA:  Chest pain with weakness. EXAM: PORTABLE CHEST 1 VIEW COMPARISON:  Radiographs 12/31/2018 and 10/17/2021. Abdominal CT 10/17/2021. FINDINGS: 1644 hours. The heart size and mediastinal contours are stable status post median sternotomy. There is aortic atherosclerosis and a small hiatal hernia. Lower lung volumes with mildly increased patchy opacities at both lung bases, likely reflecting atelectasis. No confluent airspace opacity, significant pleural effusion or pneumothorax. No acute osseous findings are evident. Glenohumeral degenerative changes bilaterally and a thoracolumbar scoliosis. Telemetry leads overlie the chest. IMPRESSION: 1. Lower lung volumes with probable resulting mild bibasilar atelectasis. No confluent airspace opacity or significant pleural effusion. 2. Aortic atherosclerosis post median sternotomy. Electronically Signed   By: Richardean Sale M.D.   On: 11/22/2021 16:58    Cardiac Studies    N/A  Assessment   Principal Problem:   NSTEMI (non-ST elevated myocardial infarction) [facility]) Active Problems:   Essential hypertension   Generalized weakness   COPD (chronic obstructive pulmonary disease) ([place])   Plan   NSTEMI History of prior CABG now presenting with chest pain and mildly elevated troponin which is rising.  Her chest pain has improved on IV nitroglycerin.  We will consider morphine for additional pain control if necessary although she was asleep and then subsequently complained of pain when I awakened her.  She is on IV heparin and nitroglycerin.  Blood pressure is stable.  We will plan to definitive heart catheterization tomorrow.  Renal function is normal. Hypertension Continue amlodipine, bisoprolol, lisinopril and furosemide Dyslipidemia Continue atorvastatin 80 mg nightly Generalized weakness Ongoing issue, may be related to coronary ischemia. COPD Continue home nebulizer therapies Hypokalemia Replete potassium to greater than 4.0 Hypothyroidism Continue Synthroid -repeat TSH, free T4 and T3 given recent abnormal labs in early June  Full code  Time Spent Directly with Patient:  I have spent a total of 45 minutes with patient reviewing hospital notes, telemetry,  EKGs, labs and examining the patient as well as establishing an assessment and plan that was discussed with the patient.  > 50% of time was spent in direct patient care.   Length of Stay:  LOS: 0 days   Pixie Casino, MD, [facility], [place] Director of the Advanced Lipid Disorders &  Cardiovascular Risk Reduction Clinic Diplomate of the American Board of Clinical Lipidology Attending Cardiologist  Direct Dial: [phone]  Fax: [phone]  Website:  [email or web address]   Nadean Corwin Alam Guterrez 11/22/2021, 7:59 PM

## 2021-11-22 NOTE — ED Triage Notes (Signed)
Pt to the ED with [facility] with complaints of chest pain. Patient has had 324 mg of Aspirin and 2 nitroglycerin pills, and 4 mg of Zofran.  Pt states she has had chest pain for the last 3 days, but it was intermittent. Today the pain has persistently gotten worse today.

## 2021-11-22 NOTE — Progress Notes (Signed)
ANTICOAGULATION CONSULT NOTE - Initial Consult  Pharmacy Consult for Heparin Indication: chest pain/ACS  Allergies  Allergen Reactions   Ivp Dye [Iodinated Contrast Media] Swelling    Kidney Dye   Betadine [Povidone Iodine] Rash   Codeine Nausea And Vomiting   Other Other (See Comments)    ALL NARCOTICS   Penicillins Rash    Did it involve swelling of the face/tongue/throat, SOB, or low BP? No Did it involve sudden or severe rash/hives, skin peeling, or any reaction on the inside of your mouth or nose? No Did you need to seek medical attention at a hospital or doctor's office? No When did it last happen?       If all above answers are "NO", may proceed with cephalosporin use.    Sulfa Antibiotics Rash    Patient Measurements: Height: 5\' 2"  (157.5 cm) Weight: 59.5 kg (131 lb 2.8 oz) IBW/kg (Calculated) : 50.1 HEPARIN DW (KG): 59.5  Vital Signs: BP: 161/97 (07/06 1620) Pulse Rate: 65 (07/06 1620)  Labs: No results for input(s): "HGB", "HCT", "PLT", "APTT", "LABPROT", "INR", "HEPARINUNFRC", "HEPRLOWMOCWT", "CREATININE", "CKTOTAL", "CKMB", "TROPONINIHS" in the last 72 hours.  CrCl cannot be calculated (Patient's most recent lab result is older than the maximum 21 days allowed.).   Medical History: Past Medical History:  Diagnosis Date   Allergic rhinitis    Anxiety    Arthritis    Asthma    Bladder spasms    CAD (coronary artery disease)    CABG 2006 [facility]   Carotid artery disease (HCC)    Collagen vascular disease (HCC)    COPD (chronic obstructive pulmonary disease) (HCC)    Cystocele    Essential hypertension    GERD (gastroesophageal reflux disease)    Hyperlipidemia    Hypothyroidism    Osteoporosis    Renal insufficiency    Vaginal vault prolapse     Medications:  (Not in a hospital admission)   Assessment: Asked to initiate Heparin for ACS.  No record of pt being on anticoagulation PTA.  Labs pending.  Goal of Therapy:  Heparin level  0.3-0.7 units/ml Monitor platelets by anticoagulation protocol: Yes   Plan:  Heparin 4000 units bolus then infusion at 850 units/hr Check HL in ~ 8 hours Monitor CBC, s/sx bleeding  [facility] 11/22/2021,4:24 PM

## 2021-11-23 ENCOUNTER — Inpatient Hospital Stay (HOSPITAL_COMMUNITY)
Admission: RE | Disposition: A | Discharge status: Home / Self Care | Payer: Self-pay | Source: Home / Self Care | Attending: Internal Medicine

## 2021-11-23 DIAGNOSIS — Z955 Presence of coronary angioplasty implant and graft: Secondary | ICD-10-CM

## 2021-11-23 DIAGNOSIS — I2510 Atherosclerotic heart disease of native coronary artery without angina pectoris: Secondary | ICD-10-CM | POA: Diagnosis not present

## 2021-11-23 DIAGNOSIS — I214 Non-ST elevation (NSTEMI) myocardial infarction: Secondary | ICD-10-CM | POA: Diagnosis not present

## 2021-11-23 HISTORY — PX: CORONARY STENT INTERVENTION: CATH118234

## 2021-11-23 HISTORY — PX: LEFT HEART CATH AND CORS/GRAFTS ANGIOGRAPHY: CATH118250

## 2021-11-23 LAB — BASIC METABOLIC PANEL
Anion gap: 12 (ref 5–15)
BUN: 13 mg/dL (ref 8–23)
CO2: 26 mmol/L (ref 22–32)
Calcium: 9.4 mg/dL (ref 8.9–10.3)
Chloride: 102 mmol/L (ref 98–111)
Creatinine, Ser: 1.03 mg/dL — ABNORMAL HIGH (ref 0.44–1.00)
GFR, Estimated: 55 mL/min — ABNORMAL LOW (ref >60)
Glucose, Bld: 174 mg/dL — ABNORMAL HIGH (ref 70–99)
Potassium: 4 mmol/L (ref 3.5–5.1)
Sodium: 140 mmol/L (ref 135–145)

## 2021-11-23 LAB — HEPARIN LEVEL (UNFRACTIONATED)
Heparin Unfractionated: 0.52 IU/mL (ref 0.30–0.70)
Heparin Unfractionated: 0.22 IU/mL — ABNORMAL LOW (ref 0.30–0.70)

## 2021-11-23 LAB — CBC
HCT: 39.4 % (ref 36.0–46.0)
Hemoglobin: 12.9 g/dL (ref 12.0–15.0)
MCH: 28.1 pg (ref 26.0–34.0)
MCHC: 32.7 g/dL (ref 30.0–36.0)
MCV: 85.8 fL (ref 80.0–100.0)
Platelets: 421 10*3/uL — ABNORMAL HIGH (ref 150–400)
RBC: 4.59 MIL/uL (ref 3.87–5.11)
RDW: 14.7 % (ref 11.5–15.5)
WBC: 10.9 10*3/uL — ABNORMAL HIGH (ref 4.0–10.5)
nRBC: 0 % (ref 0.0–0.2)

## 2021-11-23 LAB — POCT ACTIVATED CLOTTING TIME
Activated Clotting Time: 239 seconds
Activated Clotting Time: 299 seconds

## 2021-11-23 LAB — TSH: TSH: 7.409 u[IU]/mL — ABNORMAL HIGH (ref 0.350–4.500)

## 2021-11-23 LAB — LIPID PANEL
Cholesterol: 209 mg/dL — ABNORMAL HIGH (ref 0–200)
HDL: 55 mg/dL (ref >40)
LDL Cholesterol: 138 mg/dL — ABNORMAL HIGH (ref 0–99)
Total CHOL/HDL Ratio: 3.8 RATIO
Triglycerides: 79 mg/dL (ref <150)
VLDL: 16 mg/dL (ref 0–40)

## 2021-11-23 LAB — MRSA NEXT GEN BY PCR, NASAL: MRSA by PCR Next Gen: NOT DETECTED

## 2021-11-23 LAB — T4, FREE: Free T4: 1.08 ng/dL (ref 0.61–1.12)

## 2021-11-23 SURGERY — LEFT HEART CATH AND CORS/GRAFTS ANGIOGRAPHY
Anesthesia: LOCAL

## 2021-11-23 MED ORDER — FENTANYL CITRATE (PF) 100 MCG/2ML IJ SOLN
INTRAMUSCULAR | Status: AC
  Filled 2021-11-23: qty 2

## 2021-11-23 MED ORDER — ORAL CARE MOUTH RINSE: 15.0000 mL | OROMUCOSAL | Status: DC | PRN

## 2021-11-23 MED ORDER — FUROSEMIDE 10 MG/ML IJ SOLN
40.0000 mg | Freq: Once | INTRAMUSCULAR | Status: AC
  Administered 2021-11-23: 40 mg via INTRAVENOUS
  Filled 2021-11-23: qty 4

## 2021-11-23 MED ORDER — LIDOCAINE HCL (PF) 1 % IJ SOLN
INTRAMUSCULAR | Status: DC | PRN
  Administered 2021-11-23: 15 mL

## 2021-11-23 MED ORDER — SODIUM CHLORIDE 0.9% FLUSH: 3.0000 mL | INTRAVENOUS | Status: DC | PRN

## 2021-11-23 MED ORDER — PROCHLORPERAZINE EDISYLATE 10 MG/2ML IJ SOLN
10.0000 mg | Freq: Once | INTRAMUSCULAR | Status: AC
  Administered 2021-11-23: 10 mg via INTRAVENOUS
  Filled 2021-11-23: qty 2

## 2021-11-23 MED ORDER — SODIUM CHLORIDE 0.9 % IV SOLN: INTRAVENOUS | Status: DC | PRN

## 2021-11-23 MED ORDER — MIDAZOLAM HCL 2 MG/2ML IJ SOLN
INTRAMUSCULAR | Status: AC
  Filled 2021-11-23: qty 2

## 2021-11-23 MED ORDER — DIPHENHYDRAMINE HCL 50 MG/ML IJ SOLN
25.0000 mg | Freq: Once | INTRAMUSCULAR | Status: AC
  Administered 2021-11-23: 25 mg via INTRAVENOUS
  Filled 2021-11-23: qty 1

## 2021-11-23 MED ORDER — VERAPAMIL HCL 2.5 MG/ML IV SOLN: INTRAVENOUS | Status: DC | PRN

## 2021-11-23 MED ORDER — TICAGRELOR 90 MG PO TABS
90.0000 mg | ORAL_TABLET | Freq: Two times a day (BID) | ORAL | Status: DC
  Administered 2021-11-24: 90 mg via ORAL
  Filled 2021-11-23: qty 1

## 2021-11-23 MED ORDER — HEPARIN SODIUM (PORCINE) 1000 UNIT/ML IJ SOLN
INTRAMUSCULAR | Status: AC
  Filled 2021-11-23: qty 10

## 2021-11-23 MED ORDER — TICAGRELOR 90 MG PO TABS
ORAL_TABLET | ORAL | Status: DC | PRN
  Administered 2021-11-23: 180 mg via ORAL

## 2021-11-23 MED ORDER — SODIUM CHLORIDE 0.9 % IV BOLUS
INTRAVENOUS | Status: AC | PRN
  Administered 2021-11-23: 500 mL via INTRAVENOUS

## 2021-11-23 MED ORDER — HEPARIN SODIUM (PORCINE) 1000 UNIT/ML IJ SOLN
INTRAMUSCULAR | Status: DC | PRN
  Administered 2021-11-23: 5000 [IU] via INTRAVENOUS
  Administered 2021-11-23: 3000 [IU] via INTRAVENOUS

## 2021-11-23 MED ORDER — LABETALOL HCL 5 MG/ML IV SOLN
10.0000 mg | INTRAVENOUS | Status: AC | PRN
Start: 2021-11-23 — End: 2021-11-23

## 2021-11-23 MED ORDER — METHYLPREDNISOLONE SODIUM SUCC 125 MG IJ SOLR
125.0000 mg | Freq: Once | INTRAMUSCULAR | Status: AC
  Administered 2021-11-23: 125 mg via INTRAVENOUS
  Filled 2021-11-23: qty 2

## 2021-11-23 MED ORDER — HEPARIN (PORCINE) IN NACL 1000-0.9 UT/500ML-% IV SOLN
INTRAVENOUS | Status: DC | PRN
  Administered 2021-11-23 (×2): 500 mL

## 2021-11-23 MED ORDER — SODIUM CHLORIDE 0.9 % WEIGHT BASED INFUSION
1.0000 mL/kg/h | INTRAVENOUS | Status: AC
  Administered 2021-11-23 (×2): 1 mL/kg/h via INTRAVENOUS

## 2021-11-23 MED ORDER — FUROSEMIDE 10 MG/ML IJ SOLN
80.0000 mg | Freq: Once | INTRAMUSCULAR | Status: AC
  Administered 2021-11-23: 80 mg via INTRAVENOUS
  Filled 2021-11-23: qty 8

## 2021-11-23 MED ORDER — ALBUTEROL SULFATE (2.5 MG/3ML) 0.083% IN NEBU
2.5000 mg | INHALATION_SOLUTION | Freq: Four times a day (QID) | RESPIRATORY_TRACT | Status: DC | PRN
  Administered 2021-11-23: 2.5 mg via RESPIRATORY_TRACT
  Filled 2021-11-23: qty 3

## 2021-11-23 MED ORDER — VERAPAMIL HCL 2.5 MG/ML IV SOLN
INTRAVENOUS | Status: AC
  Filled 2021-11-23: qty 2

## 2021-11-23 MED ORDER — FENTANYL CITRATE (PF) 100 MCG/2ML IJ SOLN
INTRAMUSCULAR | Status: DC | PRN
  Administered 2021-11-23: 25 ug via INTRAVENOUS

## 2021-11-23 MED ORDER — SODIUM CHLORIDE 0.9 % IV SOLN
250.0000 mL | INTRAVENOUS | Status: DC | PRN
  Administered 2021-11-30: 250 mL via INTRAVENOUS

## 2021-11-23 MED ORDER — LIDOCAINE HCL (PF) 1 % IJ SOLN
INTRAMUSCULAR | Status: AC
  Filled 2021-11-23: qty 30

## 2021-11-23 MED ORDER — SODIUM CHLORIDE 0.9% FLUSH
3.0000 mL | Freq: Two times a day (BID) | INTRAVENOUS | Status: DC
  Administered 2021-11-24 – 2021-11-28 (×6): 3 mL via INTRAVENOUS

## 2021-11-23 MED ORDER — NITROGLYCERIN 1 MG/10 ML FOR IR/CATH LAB
INTRA_ARTERIAL | Status: DC | PRN
  Administered 2021-11-23: 150 ug via INTRACORONARY

## 2021-11-23 MED ORDER — HYDRALAZINE HCL 20 MG/ML IJ SOLN: 10.0000 mg | INTRAMUSCULAR | Status: AC | PRN

## 2021-11-23 MED ORDER — TICAGRELOR 90 MG PO TABS
ORAL_TABLET | ORAL | Status: AC
  Filled 2021-11-23: qty 2

## 2021-11-23 MED ORDER — HEPARIN (PORCINE) IN NACL 1000-0.9 UT/500ML-% IV SOLN
INTRAVENOUS | Status: AC
  Filled 2021-11-23: qty 1000

## 2021-11-23 MED ORDER — IOHEXOL 350 MG/ML SOLN
INTRAVENOUS | Status: DC | PRN
  Administered 2021-11-23: 150 mL

## 2021-11-23 MED ORDER — NITROGLYCERIN 1 MG/10 ML FOR IR/CATH LAB
INTRA_ARTERIAL | Status: AC
  Filled 2021-11-23: qty 10

## 2021-11-23 MED ORDER — EZETIMIBE 10 MG PO TABS
10.0000 mg | ORAL_TABLET | Freq: Every day | ORAL | Status: DC
  Administered 2021-11-23 – 2021-12-03 (×11): 10 mg via ORAL
  Filled 2021-11-23 (×11): qty 1

## 2021-11-23 MED ORDER — MIDAZOLAM HCL 2 MG/2ML IJ SOLN
INTRAMUSCULAR | Status: DC | PRN
  Administered 2021-11-23: 1 mg via INTRAVENOUS

## 2021-11-23 SURGICAL SUPPLY — 20 items
BALL SAPPHIRE NC24 4.5X15 (BALLOONS) ×2
BALLN SAPPHIRE 3.0X15 (BALLOONS) ×2
BALLOON SAPPHIRE 3.0X15 (BALLOONS) IMPLANT
BALLOON SAPPHIRE NC24 4.5X15 (BALLOONS) IMPLANT
CATH INFINITI 5FR MULTPACK ANG (CATHETERS) ×1 IMPLANT
CATH LAUNCHER 6FR EBU3.5 (CATHETERS) ×1 IMPLANT
CLOSURE PERCLOSE PROSTYLE (VASCULAR PRODUCTS) ×1 IMPLANT
GLIDESHEATH SLEND SS 6F .021 (SHEATH) ×1 IMPLANT
KIT ENCORE 26 ADVANTAGE (KITS) ×1 IMPLANT
KIT HEART LEFT (KITS) ×3 IMPLANT
KIT MICROPUNCTURE NIT STIFF (SHEATH) ×1 IMPLANT
PACK CARDIAC CATHETERIZATION (CUSTOM PROCEDURE TRAY) ×3 IMPLANT
SHEATH PINNACLE 5F 10CM (SHEATH) ×1 IMPLANT
SHEATH PINNACLE 6F 10CM (SHEATH) ×1 IMPLANT
STENT SYNERGY XD 4.0X24 (Permanent Stent) IMPLANT
SYNERGY XD 4.0X24 (Permanent Stent) ×2 IMPLANT
TRANSDUCER W/STOPCOCK (MISCELLANEOUS) ×4 IMPLANT
TUBING CIL FLEX 10 FLL-RA (TUBING) ×3 IMPLANT
WIRE COUGAR XT STRL 190CM (WIRE) ×1 IMPLANT
WIRE EMERALD 3MM-J .035X150CM (WIRE) ×1 IMPLANT

## 2021-11-23 NOTE — Progress Notes (Signed)
RN spoke with patient's daughter, Burna Mortimer, for an update on patient's status.  Burna Mortimer states that her mother has been increasing confused in the evenings and that the family has been concerned over her mental status for "some time".  She also states that there have been some financial stressors that she is concerned are leading to a "down turn" of her mother's bipolar disorder and may be the cause of this "heart stuff" she is experiencing.  Family updated on plan of care for patient and stated understanding.

## 2021-11-23 NOTE — TOC Progression Note (Signed)
Transition of Care [facility]) - Progression Note    Patient Details  Name: Debbie [name] MRN: [id number] Date of Birth: [birth year]  Transition of Care [facility]) CM/SW Contact  Beckie Busing, RN Phone Number:[phone]  11/23/2021, 12:21 PM  Clinical Narrative:    TOC following patient admitted from [facility] with c/o chest pain and found to have NSTEMI.  Transition of Care [facility]) Screening Note   Patient Details  Name: Debbie [name] Date of Birth: 22-Jun-1940   Transition of Care [facility]) CM/SW Contact:    Beckie Busing, RN Phone Number: 11/23/2021, 12:22 PM    Transition of Care Department [facility]) has reviewed patient and no TOC needs have been identified at this time. We will continue to monitor patient advancement through interdisciplinary progression rounds.           Expected Discharge Plan and Services                                                 Social Determinants of Health (SDOH) Interventions    Readmission Risk Interventions     No data to display

## 2021-11-23 NOTE — Progress Notes (Addendum)
Patient complaining of chest tightness, titrated her Nitro gtt from 100 mcg/min to 110 mcg/min, Cardiology MD Tenny Craw at bedside,order to titrate nitro until scheduled cath, will continue to monitor.

## 2021-11-23 NOTE — Progress Notes (Addendum)
On assessment, patient with new crackles.  NP notified.  Orders to stop fluid and to administer 40mg  IVP lasix. O2 sats 95% on 2L [place]. Will continue to monitor.

## 2021-11-23 NOTE — H&P (View-Only) (Signed)
Progress Note  Patient Name: Debbie [name] Date of Encounter: 11/23/2021  [facility] Cardiologist: [name], MD   Subjective   Patient continues to have some mild 3/10 chest pain this morning but much improved from when she came in. She also reports feeling a little clammy this morning. No chest pain. Plan is for cardiac catheterization later today.  Inpatient Medications    Scheduled Meds:  amLODipine  5 mg Oral Daily   aspirin EC  81 mg Oral Q breakfast   atorvastatin  80 mg Oral QHS   bisoprolol  5 mg Oral Daily   cholecalciferol  1,000 Units Oral Daily   famotidine  20 mg Oral Daily   furosemide  80 mg Oral Daily   levothyroxine  88 mcg Oral Daily   lisinopril  20 mg Oral Daily   montelukast  10 mg Oral QHS   potassium chloride  20 mEq Oral BID   sodium chloride flush  3 mL Intravenous Q12H   sodium chloride flush  3 mL Intravenous Q12H   Continuous Infusions:  sodium chloride     sodium chloride     sodium chloride 1 mL/kg/hr (11/23/21 0550)   heparin 850 Units/hr (11/23/21 0550)   nitroGLYCERIN 100 mcg/min (11/23/21 0550)   PRN Meds: sodium chloride, sodium chloride, acetaminophen, ALPRAZolam, nitroGLYCERIN, ondansetron (ZOFRAN) IV, mouth rinse, sodium chloride flush, sodium chloride flush, zolpidem   Vital Signs    Vitals:   11/23/21 0023 11/23/21 0320 11/23/21 0447 11/23/21 0800  BP: 124/85 131/86  125/84  Pulse: 60 73  80  Resp: 20 18  (!) 21  Temp:  98.4 F (36.9 C)    TempSrc:  Oral    SpO2: 93% 91%  90%  Weight:   58.6 kg   Height:        Intake/Output Summary (Last 24 hours) at 11/23/2021 0923 Last data filed at 11/23/2021 0550 Gross per 24 hour  Intake 769.29 ml  Output 75 ml  Net 694.29 ml      11/23/2021    4:47 AM 11/22/2021   11:45 PM 11/22/2021    3:50 PM  Last 3 Weights  Weight (lbs) 129 lb 3 oz 129 lb 3 oz 131 lb 2.8 oz  Weight (kg) 58.6 kg 58.6 kg 59.5 kg      Telemetry    Normal sinus rhythm with rates in the 60s  to 70s. - Personally Reviewed  ECG    Normal sinus rhythm, rate 63 bpm, with T wave inversions in leads V2-V4 and 1 and aVL.  Normal axis XX.  Normal PR and QRS intervals.  QTc 444 ms. - Personally Reviewed  Physical Exam   GEN: No acute distress.   Neck: No JVD. Cardiac: RRR. No murmurs, rubs, or gallops. Radial pulses 2+ and equal bilaterally. Respiratory: Clear to auscultation bilaterally. No wheezes, rhonchi, or rales. GI: Soft, non-distended, and non-tender. MS: No lower extremity edema. No deformity. Skin: Warm and dry. Neuro:  No focal deficits. Psych: Normal affect. Responds appropriately.  Labs    High Sensitivity Troponin:   Recent Labs  Lab 11/22/21 1552 11/22/21 1752  TROPONINIHS 208* 420*     Chemistry Recent Labs  Lab 11/22/21 1552 11/22/21 2230 11/23/21 0049  NA 142  --  140  K 3.4*  --  4.0  CL 106  --  102  CO2 27  --  26  GLUCOSE 122*  --  174*  BUN 13  --  13  CREATININE 0.94  --  1.03*  CALCIUM 9.2  --  9.4  PROT  --  5.6*  --   ALBUMIN  --  3.2*  --   AST  --  43*  --   ALT  --  16  --   ALKPHOS  --  60  --   BILITOT  --  0.9  --   GFRNONAA >60  --  55*  ANIONGAP 9  --  12    Lipids  Recent Labs  Lab 11/23/21 0049  CHOL 209*  TRIG 79  HDL 55  LDLCALC 138*  CHOLHDL 3.8    Hematology Recent Labs  Lab 11/22/21 1552 11/23/21 0049  WBC 8.1 10.9*  RBC 4.69 4.59  HGB 13.3 12.9  HCT 41.0 39.4  MCV 87.4 85.8  MCH 28.4 28.1  MCHC 32.4 32.7  RDW 14.9 14.7  PLT 447* 421*   Thyroid  Recent Labs  Lab 11/23/21 0049  TSH 7.409*  FREET4 1.08    BNPNo results for input(s): "BNP", "PROBNP" in the last 168 hours.  DDimer No results for input(s): "DDIMER" in the last 168 hours.   Radiology    DG Chest Port 1 View  Result Date: 11/22/2021 CLINICAL DATA:  Chest pain with weakness. EXAM: PORTABLE CHEST 1 VIEW COMPARISON:  Radiographs 12/31/2018 and 10/17/2021. Abdominal CT 10/17/2021. FINDINGS: 1644 hours. The heart size and  mediastinal contours are stable status post median sternotomy. There is aortic atherosclerosis and a small hiatal hernia. Lower lung volumes with mildly increased patchy opacities at both lung bases, likely reflecting atelectasis. No confluent airspace opacity, significant pleural effusion or pneumothorax. No acute osseous findings are evident. Glenohumeral degenerative changes bilaterally and a thoracolumbar scoliosis. Telemetry leads overlie the chest. IMPRESSION: 1. Lower lung volumes with probable resulting mild bibasilar atelectasis. No confluent airspace opacity or significant pleural effusion. 2. Aortic atherosclerosis post median sternotomy. Electronically Signed   By: Richardean Sale M.D.   On: 11/22/2021 16:58    Cardiac Studies   Echocardiogram 10/19/2021: Impressions: 1. Left ventricular ejection fraction, by estimation, is 60 to 65%. The  left ventricle has normal function. The left ventricle has no regional  wall motion abnormalities. There is moderate asymmetric left ventricular  hypertrophy of the basal-septal  segment. Left ventricular diastolic parameters are indeterminate.   2. Right ventricular systolic function is normal. The right ventricular  size is normal. There is normal pulmonary artery systolic pressure. The  estimated right ventricular systolic pressure is 0000000 mmHg.   3. Left atrial size was moderately dilated.   4. The mitral valve is normal in structure. Trivial mitral valve  regurgitation. No evidence of mitral stenosis.   5. The aortic valve is tricuspid. Aortic valve regurgitation is not  visualized. No aortic stenosis is present.   6. The inferior vena cava is normal in size with greater than 50%  respiratory variability, suggesting right atrial pressure of 3 mmHg.    Patient Profile     81 y.o. female with a history of CAD s/p CABG in 2006 in [place], [place], carotid artery disease, hypertension, hyperlipidemia, COPD, collagen vascular disease, hypothyroidism,  and GERD who presented to [facility] on 11/22/2021 with chest pain and ruled in for a NSTEMI. She was transferred to [facility] for cardiac catheterization.  Assessment & Plan    NSTEMI CAD s/p CABG High-sensitivity troponin elevated at 208 >> 420. Recent Echo in 10/2021 showed LVEF of 60-65% with no regional wall motion abnormalities and  moderate symmetric LVH of the basal-septal segment. - She continues to have mild chest pain this morning but much improved from yesterday. - Continue IV Nitroglycerin. Can increase this as needed for additional pain control. - Continue IV Heparin. - Continue aspirin, beta-blocker, and high-intensity statin. - Plan is for cardiac catheterization today. Patient does have a contrast allergy and is being premedicated. The patient understands that risks include but are not limited to stroke (1 in 1000), death (1 in 1000), kidney failure [usually temporary] (1 in 500), bleeding (1 in 200), allergic reaction [possibly serious] (1 in 200), and agrees to proceed.   Hypertension BP mostly well controlled. - Continue current medications: Amlodipine 5mg  daily, Bisoprolol 5mg  daily, Lisinopril 20mg  daily. Also on Lasix 80mg  daily.  Hyperlipidemia Lipid panel this admission: Total Cholesterol 209, Triglycerides 79, HDL 55, LDL 138.  - Continue Lipitor 80mg  daily. - Will add Zetia 10mg  daily. - Will need repeat lipid panel and LFTs in 6-8 weeks. Suspect she may need PCSK9 inhibitor.  Hypothyroidism TSH elevated at 7.409 (much improved from 40 last month) but free T4 and T3 are normal. - Continue current dose of Synthroid.  COPD  Stable.  - She states she is no longer on any inhalers or nebulizers at home.   For questions or updates, please contact [facility] Please consult [email or web address] for contact info under        Signed, , PA-C  11/23/2021, 9:23 AM     Patient seen and examined  I agree with findings as noted above by C  Goodrich Pt still having mld chest discomfort   Pressure  On exam, Lungs are CTA Cardiac RRR  No S3 Abd is supple  Ext are without edema  Will continue current regimen  Pt has N/V with codeine   Will review with her   ? MSO4   She is currently on IV NTG and heparin    Cath today   MD

## 2021-11-23 NOTE — Progress Notes (Signed)
Patient complaining of new lower abdominal pain after coming back from heart cath. Describes pain as pressured, feeling "like her kidneys are going to explode." Bladder scan shows 51ml in bladder and she has put out since returning from cath lab at 1500. Cath lab RN came and looked at site and will update MD Excell Seltzer. Blood pressure is elevated, last BP was systolic 159. Paged MD Tenny Craw and updated her on situation. Will continue to monitor, no new orders at this time.

## 2021-11-23 NOTE — Interval H&P Note (Signed)
History and Physical Interval Note:  11/23/2021 1:23 PM  Debbie [name]  has presented today for surgery, with the diagnosis of NSTEMI.  The various methods of treatment have been discussed with the patient and family. After consideration of risks, benefits and other options for treatment, the patient has consented to  Procedure(s): LEFT HEART CATH AND CORS/GRAFTS ANGIOGRAPHY (N/A) as a surgical intervention.  The patient's history has been reviewed, patient examined, no change in status, stable for surgery.  I have reviewed the patient's chart and labs.  Questions were answered to the patient's satisfaction.     Tonny Bollman

## 2021-11-23 NOTE — Progress Notes (Signed)
ANTICOAGULATION CONSULT NOTE - Follow Up Consult  Pharmacy Consult for heparin Indication:  NSTEMI  Labs: Recent Labs    11/22/21 1552 11/22/21 1752 11/23/21 0049  HGB 13.3  --  12.9  HCT 41.0  --  39.4  PLT 447*  --  421*  HEPARINUNFRC  --   --  0.52  CREATININE 0.94  --  1.03*  TROPONINIHS 208* 420*  --     Assessment/Plan:  81yo female therapeutic on heparin with initial dosing for NSTEMI. Will continue infusion at current rate of 850 units/hr and confirm stable with additional level.   Vernard Gambles, PharmD, BCPS  11/23/2021,1:30 AM

## 2021-11-23 NOTE — Progress Notes (Signed)
ANTICOAGULATION CONSULT NOTE - Follow Up Consult  Pharmacy Consult for IV heparin Indication: chest pain/ACS  Allergies  Allergen Reactions   Ivp Dye [Iodinated Contrast Media] Swelling    Kidney Dye   Betadine [Povidone Iodine] Rash   Codeine Nausea And Vomiting   Other Other (See Comments)    ALL NARCOTICS   Penicillins Rash    Did it involve swelling of the face/tongue/throat, SOB, or low BP? No Did it involve sudden or severe rash/hives, skin peeling, or any reaction on the inside of your mouth or nose? No Did you need to seek medical attention at a hospital or doctor's office? No When did it last happen?       If all above answers are "NO", may proceed with cephalosporin use.    Sulfa Antibiotics Rash    Patient Measurements: Height: 5' 2.5" (158.8 cm) Weight: 58.6 kg (129 lb 3 oz) IBW/kg (Calculated) : 51.25 Heparin Dosing Weight: 58.6  Vital Signs: Temp: 98.4 F (36.9 C) (07/07 0320) Temp Source: Oral (07/07 0320) BP: 125/84 (07/07 0800) Pulse Rate: 80 (07/07 0800)  Labs: Recent Labs    11/22/21 1552 11/22/21 1752 11/23/21 0049 11/23/21 0815  HGB 13.3  --  12.9  --   HCT 41.0  --  39.4  --   PLT 447*  --  421*  --   HEPARINUNFRC  --   --  0.52 0.22*  CREATININE 0.94  --  1.03*  --   TROPONINIHS 208* 420*  --   --     Estimated Creatinine Clearance: 35.3 mL/min (A) (by C-G formula based on SCr of 1.03 mg/dL (H)).   Medications:  Infusions:   sodium chloride     sodium chloride     sodium chloride 1 mL/kg/hr (11/23/21 0550)   heparin 850 Units/hr (11/23/21 0550)   nitroGLYCERIN 100 mcg/min (11/23/21 0550)    Assessment: 80 YOF with NSTEMI. Not on anticoagulant PTA. Hgb and PLTs wnl and stable. Plan to go to cath 7/7. Heparin level 0.22 subtherapeutic. No issues with heparin infusion or bleeding noted.   Goal of Therapy:  Heparin level 0.3-0.7 units/ml Monitor platelets by anticoagulation protocol: Yes   Plan:  No bolus Increase heparin rate  to 950 units/hr Obtain heparin level in 8 hrs and daily while on heparin  F/u cath plans Continue to monitor H&H and platelets  Larena Sox, PharmD PGY1 Pharmacy Resident   11/23/2021  9:31 AM

## 2021-11-23 NOTE — Progress Notes (Addendum)
Progress Note  Patient Name: Debbie [name] Date of Encounter: 11/23/2021  [facility] Cardiologist: [name], MD   Subjective   Patient continues to have some mild 3/10 chest pain this morning but much improved from when she came in. She also reports feeling a little clammy this morning. No chest pain. Plan is for cardiac catheterization later today.  Inpatient Medications    Scheduled Meds:  amLODipine  5 mg Oral Daily   aspirin EC  81 mg Oral Q breakfast   atorvastatin  80 mg Oral QHS   bisoprolol  5 mg Oral Daily   cholecalciferol  1,000 Units Oral Daily   famotidine  20 mg Oral Daily   furosemide  80 mg Oral Daily   levothyroxine  88 mcg Oral Daily   lisinopril  20 mg Oral Daily   montelukast  10 mg Oral QHS   potassium chloride  20 mEq Oral BID   sodium chloride flush  3 mL Intravenous Q12H   sodium chloride flush  3 mL Intravenous Q12H   Continuous Infusions:  sodium chloride     sodium chloride     sodium chloride 1 mL/kg/hr (11/23/21 0550)   heparin 850 Units/hr (11/23/21 0550)   nitroGLYCERIN 100 mcg/min (11/23/21 0550)   PRN Meds: sodium chloride, sodium chloride, acetaminophen, ALPRAZolam, nitroGLYCERIN, ondansetron (ZOFRAN) IV, mouth rinse, sodium chloride flush, sodium chloride flush, zolpidem   Vital Signs    Vitals:   11/23/21 0023 11/23/21 0320 11/23/21 0447 11/23/21 0800  BP: 124/85 131/86  125/84  Pulse: 60 73  80  Resp: 20 18  (!) 21  Temp:  98.4 F (36.9 C)    TempSrc:  Oral    SpO2: 93% 91%  90%  Weight:   58.6 kg   Height:        Intake/Output Summary (Last 24 hours) at 11/23/2021 0923 Last data filed at 11/23/2021 0550 Gross per 24 hour  Intake 769.29 ml  Output 75 ml  Net 694.29 ml      11/23/2021    4:47 AM 11/22/2021   11:45 PM 11/22/2021    3:50 PM  Last 3 Weights  Weight (lbs) 129 lb 3 oz 129 lb 3 oz 131 lb 2.8 oz  Weight (kg) 58.6 kg 58.6 kg 59.5 kg      Telemetry    Normal sinus rhythm with rates in the 60s  to 70s. - Personally Reviewed  ECG    Normal sinus rhythm, rate 63 bpm, with T wave inversions in leads V2-V4 and 1 and aVL.  Normal axis XX.  Normal PR and QRS intervals.  QTc 444 ms. - Personally Reviewed  Physical Exam   GEN: No acute distress.   Neck: No JVD. Cardiac: RRR. No murmurs, rubs, or gallops. Radial pulses 2+ and equal bilaterally. Respiratory: Clear to auscultation bilaterally. No wheezes, rhonchi, or rales. GI: Soft, non-distended, and non-tender. MS: No lower extremity edema. No deformity. Skin: Warm and dry. Neuro:  No focal deficits. Psych: Normal affect. Responds appropriately.  Labs    High Sensitivity Troponin:   Recent Labs  Lab 11/22/21 1552 11/22/21 1752  TROPONINIHS 208* 420*     Chemistry Recent Labs  Lab 11/22/21 1552 11/22/21 2230 11/23/21 0049  NA 142  --  140  K 3.4*  --  4.0  CL 106  --  102  CO2 27  --  26  GLUCOSE 122*  --  174*  BUN 13  --  13  CREATININE 0.94  --  1.03*  CALCIUM 9.2  --  9.4  PROT  --  5.6*  --   ALBUMIN  --  3.2*  --   AST  --  43*  --   ALT  --  16  --   ALKPHOS  --  60  --   BILITOT  --  0.9  --   GFRNONAA >60  --  55*  ANIONGAP 9  --  12    Lipids  Recent Labs  Lab 11/23/21 0049  CHOL 209*  TRIG 79  HDL 55  LDLCALC 138*  CHOLHDL 3.8    Hematology Recent Labs  Lab 11/22/21 1552 11/23/21 0049  WBC 8.1 10.9*  RBC 4.69 4.59  HGB 13.3 12.9  HCT 41.0 39.4  MCV 87.4 85.8  MCH 28.4 28.1  MCHC 32.4 32.7  RDW 14.9 14.7  PLT 447* 421*   Thyroid  Recent Labs  Lab 11/23/21 0049  TSH 7.409*  FREET4 1.08    BNPNo results for input(s): "BNP", "PROBNP" in the last 168 hours.  DDimer No results for input(s): "DDIMER" in the last 168 hours.   Radiology    DG Chest Port 1 View  Result Date: 11/22/2021 CLINICAL DATA:  Chest pain with weakness. EXAM: PORTABLE CHEST 1 VIEW COMPARISON:  Radiographs 12/31/2018 and 10/17/2021. Abdominal CT 10/17/2021. FINDINGS: 1644 hours. The heart size and  mediastinal contours are stable status post median sternotomy. There is aortic atherosclerosis and a small hiatal hernia. Lower lung volumes with mildly increased patchy opacities at both lung bases, likely reflecting atelectasis. No confluent airspace opacity, significant pleural effusion or pneumothorax. No acute osseous findings are evident. Glenohumeral degenerative changes bilaterally and a thoracolumbar scoliosis. Telemetry leads overlie the chest. IMPRESSION: 1. Lower lung volumes with probable resulting mild bibasilar atelectasis. No confluent airspace opacity or significant pleural effusion. 2. Aortic atherosclerosis post median sternotomy. Electronically Signed   By: Richardean Sale M.D.   On: 11/22/2021 16:58    Cardiac Studies   Echocardiogram 10/19/2021: Impressions: 1. Left ventricular ejection fraction, by estimation, is 60 to 65%. The  left ventricle has normal function. The left ventricle has no regional  wall motion abnormalities. There is moderate asymmetric left ventricular  hypertrophy of the basal-septal  segment. Left ventricular diastolic parameters are indeterminate.   2. Right ventricular systolic function is normal. The right ventricular  size is normal. There is normal pulmonary artery systolic pressure. The  estimated right ventricular systolic pressure is 0000000 mmHg.   3. Left atrial size was moderately dilated.   4. The mitral valve is normal in structure. Trivial mitral valve  regurgitation. No evidence of mitral stenosis.   5. The aortic valve is tricuspid. Aortic valve regurgitation is not  visualized. No aortic stenosis is present.   6. The inferior vena cava is normal in size with greater than 50%  respiratory variability, suggesting right atrial pressure of 3 mmHg.    Patient Profile     81 y.o. female with a history of CAD s/p CABG in 2006 in [place], [place], carotid artery disease, hypertension, hyperlipidemia, COPD, collagen vascular disease, hypothyroidism,  and GERD who presented to [facility] on 11/22/2021 with chest pain and ruled in for a NSTEMI. She was transferred to [facility] for cardiac catheterization.  Assessment & Plan    NSTEMI CAD s/p CABG High-sensitivity troponin elevated at 208 >> 420. Recent Echo in 10/2021 showed LVEF of 60-65% with no regional wall motion abnormalities and  moderate symmetric LVH of the basal-septal segment. - She continues to have mild chest pain this morning but much improved from yesterday. - Continue IV Nitroglycerin. Can increase this as needed for additional pain control. - Continue IV Heparin. - Continue aspirin, beta-blocker, and high-intensity statin. - Plan is for cardiac catheterization today. Patient does have a contrast allergy and is being premedicated. The patient understands that risks include but are not limited to stroke (1 in 1000), death (1 in 1000), kidney failure [usually temporary] (1 in 500), bleeding (1 in 200), allergic reaction [possibly serious] (1 in 200), and agrees to proceed.   Hypertension BP mostly well controlled. - Continue current medications: Amlodipine 5mg  daily, Bisoprolol 5mg  daily, Lisinopril 20mg  daily. Also on Lasix 80mg  daily.  Hyperlipidemia Lipid panel this admission: Total Cholesterol 209, Triglycerides 79, HDL 55, LDL 138.  - Continue Lipitor 80mg  daily. - Will add Zetia 10mg  daily. - Will need repeat lipid panel and LFTs in 6-8 weeks. Suspect she may need PCSK9 inhibitor.  Hypothyroidism TSH elevated at 7.409 (much improved from 40 last month) but free T4 and T3 are normal. - Continue current dose of Synthroid.  COPD  Stable.  - She states she is no longer on any inhalers or nebulizers at home.   For questions or updates, please contact [facility] Please consult [email or web address] for contact info under        Signed, , PA-C  11/23/2021, 9:23 AM     Patient seen and examined  I agree with findings as noted above by C  Goodrich Pt still having mld chest discomfort   Pressure  On exam, Lungs are CTA Cardiac RRR  No S3 Abd is supple  Ext are without edema  Will continue current regimen  Pt has N/V with codeine   Will review with her   ? MSO4   She is currently on IV NTG and heparin    Cath today   MD

## 2021-11-24 ENCOUNTER — Observation Stay (HOSPITAL_COMMUNITY): Payer: Medicare HMO

## 2021-11-24 ENCOUNTER — Inpatient Hospital Stay (HOSPITAL_COMMUNITY)
Admission: RE | Disposition: A | Discharge status: Home / Self Care | Payer: Self-pay | Source: Home / Self Care | Attending: Internal Medicine

## 2021-11-24 DIAGNOSIS — N179 Acute kidney failure, unspecified: Secondary | ICD-10-CM | POA: Diagnosis not present

## 2021-11-24 DIAGNOSIS — I249 Acute ischemic heart disease, unspecified: Secondary | ICD-10-CM | POA: Diagnosis present

## 2021-11-24 DIAGNOSIS — Z882 Allergy status to sulfonamides status: Secondary | ICD-10-CM | POA: Diagnosis not present

## 2021-11-24 DIAGNOSIS — Z91041 Radiographic dye allergy status: Secondary | ICD-10-CM | POA: Diagnosis not present

## 2021-11-24 DIAGNOSIS — I214 Non-ST elevation (NSTEMI) myocardial infarction: Secondary | ICD-10-CM | POA: Diagnosis present

## 2021-11-24 DIAGNOSIS — I97630 Postprocedural hematoma of a circulatory system organ or structure following a cardiac catheterization: Secondary | ICD-10-CM | POA: Diagnosis not present

## 2021-11-24 DIAGNOSIS — E785 Hyperlipidemia, unspecified: Secondary | ICD-10-CM | POA: Diagnosis present

## 2021-11-24 DIAGNOSIS — I724 Aneurysm of artery of lower extremity: Secondary | ICD-10-CM | POA: Diagnosis not present

## 2021-11-24 DIAGNOSIS — I5043 Acute on chronic combined systolic (congestive) and diastolic (congestive) heart failure: Secondary | ICD-10-CM | POA: Diagnosis not present

## 2021-11-24 DIAGNOSIS — I255 Ischemic cardiomyopathy: Secondary | ICD-10-CM | POA: Diagnosis present

## 2021-11-24 DIAGNOSIS — I5023 Acute on chronic systolic (congestive) heart failure: Secondary | ICD-10-CM | POA: Diagnosis not present

## 2021-11-24 DIAGNOSIS — J438 Other emphysema: Secondary | ICD-10-CM | POA: Diagnosis not present

## 2021-11-24 DIAGNOSIS — M1990 Unspecified osteoarthritis, unspecified site: Secondary | ICD-10-CM | POA: Diagnosis present

## 2021-11-24 DIAGNOSIS — I2510 Atherosclerotic heart disease of native coronary artery without angina pectoris: Secondary | ICD-10-CM | POA: Diagnosis present

## 2021-11-24 DIAGNOSIS — R079 Chest pain, unspecified: Secondary | ICD-10-CM | POA: Diagnosis not present

## 2021-11-24 DIAGNOSIS — I513 Intracardiac thrombosis, not elsewhere classified: Secondary | ICD-10-CM | POA: Diagnosis not present

## 2021-11-24 DIAGNOSIS — I110 Hypertensive heart disease with heart failure: Secondary | ICD-10-CM | POA: Diagnosis present

## 2021-11-24 DIAGNOSIS — Z7982 Long term (current) use of aspirin: Secondary | ICD-10-CM | POA: Diagnosis not present

## 2021-11-24 DIAGNOSIS — J9811 Atelectasis: Secondary | ICD-10-CM | POA: Diagnosis present

## 2021-11-24 DIAGNOSIS — Y839 Surgical procedure, unspecified as the cause of abnormal reaction of the patient, or of later complication, without mention of misadventure at the time of the procedure: Secondary | ICD-10-CM | POA: Diagnosis not present

## 2021-11-24 DIAGNOSIS — K219 Gastro-esophageal reflux disease without esophagitis: Secondary | ICD-10-CM | POA: Diagnosis present

## 2021-11-24 DIAGNOSIS — E876 Hypokalemia: Secondary | ICD-10-CM | POA: Diagnosis present

## 2021-11-24 DIAGNOSIS — R531 Weakness: Secondary | ICD-10-CM | POA: Diagnosis not present

## 2021-11-24 DIAGNOSIS — I25810 Atherosclerosis of coronary artery bypass graft(s) without angina pectoris: Secondary | ICD-10-CM | POA: Diagnosis not present

## 2021-11-24 DIAGNOSIS — Z888 Allergy status to other drugs, medicaments and biological substances status: Secondary | ICD-10-CM | POA: Diagnosis not present

## 2021-11-24 DIAGNOSIS — I471 Supraventricular tachycardia: Secondary | ICD-10-CM | POA: Diagnosis not present

## 2021-11-24 DIAGNOSIS — F03A0 Unspecified dementia, mild, without behavioral disturbance, psychotic disturbance, mood disturbance, and anxiety: Secondary | ICD-10-CM | POA: Diagnosis present

## 2021-11-24 DIAGNOSIS — M810 Age-related osteoporosis without current pathological fracture: Secondary | ICD-10-CM | POA: Diagnosis present

## 2021-11-24 DIAGNOSIS — Z880 Allergy status to penicillin: Secondary | ICD-10-CM | POA: Diagnosis not present

## 2021-11-24 DIAGNOSIS — R319 Hematuria, unspecified: Secondary | ICD-10-CM | POA: Diagnosis not present

## 2021-11-24 DIAGNOSIS — Z79890 Hormone replacement therapy (postmenopausal): Secondary | ICD-10-CM | POA: Diagnosis not present

## 2021-11-24 DIAGNOSIS — Y713 Surgical instruments, materials and cardiovascular devices (including sutures) associated with adverse incidents: Secondary | ICD-10-CM | POA: Diagnosis not present

## 2021-11-24 DIAGNOSIS — E039 Hypothyroidism, unspecified: Secondary | ICD-10-CM | POA: Diagnosis present

## 2021-11-24 HISTORY — PX: LEFT HEART CATH AND CORONARY ANGIOGRAPHY: CATH118249

## 2021-11-24 HISTORY — PX: CORONARY/GRAFT ACUTE MI REVASCULARIZATION: CATH118305

## 2021-11-24 LAB — CBC
HCT: 44.6 % (ref 36.0–46.0)
HCT: 38.9 % (ref 36.0–46.0)
Hemoglobin: 15 g/dL (ref 12.0–15.0)
Hemoglobin: 12.6 g/dL (ref 12.0–15.0)
MCH: 28.4 pg (ref 26.0–34.0)
MCH: 27.9 pg (ref 26.0–34.0)
MCHC: 33.6 g/dL (ref 30.0–36.0)
MCHC: 32.4 g/dL (ref 30.0–36.0)
MCV: 84.5 fL (ref 80.0–100.0)
MCV: 86.1 fL (ref 80.0–100.0)
Platelets: 481 10*3/uL — ABNORMAL HIGH (ref 150–400)
Platelets: 368 10*3/uL (ref 150–400)
RBC: 5.28 MIL/uL — ABNORMAL HIGH (ref 3.87–5.11)
RBC: 4.52 MIL/uL (ref 3.87–5.11)
RDW: 14.9 % (ref 11.5–15.5)
RDW: 15.4 % (ref 11.5–15.5)
WBC: 22.9 10*3/uL — ABNORMAL HIGH (ref 4.0–10.5)
WBC: 16.2 10*3/uL — ABNORMAL HIGH (ref 4.0–10.5)
nRBC: 0 % (ref 0.0–0.2)
nRBC: 0 % (ref 0.0–0.2)

## 2021-11-24 LAB — BASIC METABOLIC PANEL
Anion gap: 15 (ref 5–15)
BUN: 20 mg/dL (ref 8–23)
CO2: 24 mmol/L (ref 22–32)
Calcium: 10 mg/dL (ref 8.9–10.3)
Chloride: 103 mmol/L (ref 98–111)
Creatinine, Ser: 1.42 mg/dL — ABNORMAL HIGH (ref 0.44–1.00)
GFR, Estimated: 37 mL/min — ABNORMAL LOW (ref >60)
Glucose, Bld: 180 mg/dL — ABNORMAL HIGH (ref 70–99)
Potassium: 3.5 mmol/L (ref 3.5–5.1)
Sodium: 142 mmol/L (ref 135–145)

## 2021-11-24 LAB — POCT ACTIVATED CLOTTING TIME
Activated Clotting Time: 281 seconds
Activated Clotting Time: 209 seconds
Activated Clotting Time: 245 seconds
Activated Clotting Time: 173 seconds
Activated Clotting Time: 233 seconds
Activated Clotting Time: 155 seconds
Activated Clotting Time: 149 seconds

## 2021-11-24 LAB — LIPOPROTEIN A (LPA): Lipoprotein (a): 69.3 nmol/L — ABNORMAL HIGH (ref <75.0)

## 2021-11-24 LAB — ECHOCARDIOGRAM LIMITED
Calc EF: 35.4 %
Height: 62.5 in
Single Plane A2C EF: 38.1 %
Single Plane A4C EF: 32.4 %
Weight: 2052.92 oz

## 2021-11-24 LAB — T3: T3, Total: 105 ng/dL (ref 71–180)

## 2021-11-24 LAB — TROPONIN I (HIGH SENSITIVITY): Troponin I (High Sensitivity): >24000 ng/L (ref <18)

## 2021-11-24 SURGERY — CORONARY/GRAFT ACUTE MI REVASCULARIZATION
Anesthesia: LOCAL

## 2021-11-24 MED ORDER — SODIUM CHLORIDE 0.9 % IV SOLN: 250.0000 mL | INTRAVENOUS | Status: DC | PRN

## 2021-11-24 MED ORDER — PERFLUTREN LIPID MICROSPHERE
1.0000 mL | INTRAVENOUS | Status: AC | PRN
  Administered 2021-11-24: 3 mL via INTRAVENOUS

## 2021-11-24 MED ORDER — ASPIRIN 81 MG PO CHEW
81.0000 mg | CHEWABLE_TABLET | Freq: Every day | ORAL | Status: DC
Start: 2021-11-25 — End: 2021-11-28
  Administered 2021-11-25 – 2021-11-27 (×3): 81 mg via ORAL
  Filled 2021-11-24 (×3): qty 1

## 2021-11-24 MED ORDER — METHYLPREDNISOLONE SODIUM SUCC 125 MG IJ SOLR
INTRAMUSCULAR | Status: DC | PRN
  Administered 2021-11-24: 125 mg via INTRAVENOUS

## 2021-11-24 MED ORDER — HEPARIN (PORCINE) IN NACL 1000-0.9 UT/500ML-% IV SOLN
INTRAVENOUS | Status: DC | PRN
  Administered 2021-11-24 (×2): 500 mL

## 2021-11-24 MED ORDER — SODIUM CHLORIDE 0.9% FLUSH: 3.0000 mL | Freq: Two times a day (BID) | INTRAVENOUS | Status: DC

## 2021-11-24 MED ORDER — POTASSIUM CHLORIDE CRYS ER 20 MEQ PO TBCR
40.0000 meq | EXTENDED_RELEASE_TABLET | Freq: Once | ORAL | Status: AC
  Administered 2021-11-24: 40 meq via ORAL
  Filled 2021-11-24: qty 2

## 2021-11-24 MED ORDER — FUROSEMIDE 10 MG/ML IJ SOLN
40.0000 mg | Freq: Once | INTRAMUSCULAR | Status: AC
  Administered 2021-11-24: 40 mg via INTRAVENOUS
  Filled 2021-11-24: qty 4

## 2021-11-24 MED ORDER — VERAPAMIL HCL 2.5 MG/ML IV SOLN
INTRAVENOUS | Status: AC
  Filled 2021-11-24: qty 2

## 2021-11-24 MED ORDER — LIDOCAINE HCL (PF) 1 % IJ SOLN
INTRAMUSCULAR | Status: AC
  Filled 2021-11-24: qty 30

## 2021-11-24 MED ORDER — CHLORHEXIDINE GLUCONATE CLOTH 2 % EX PADS
6.0000 | MEDICATED_PAD | Freq: Every day | CUTANEOUS | Status: DC
  Administered 2021-11-24 – 2021-11-28 (×5): 6 via TOPICAL

## 2021-11-24 MED ORDER — DIPHENHYDRAMINE HCL 50 MG/ML IJ SOLN
INTRAMUSCULAR | Status: DC | PRN
  Administered 2021-11-24: 25 mg via INTRAVENOUS

## 2021-11-24 MED ORDER — LABETALOL HCL 5 MG/ML IV SOLN: 10.0000 mg | INTRAVENOUS | Status: AC | PRN

## 2021-11-24 MED ORDER — ONDANSETRON HCL 4 MG/2ML IJ SOLN: 4.0000 mg | Freq: Four times a day (QID) | INTRAMUSCULAR | Status: DC | PRN

## 2021-11-24 MED ORDER — DIPHENHYDRAMINE HCL 50 MG/ML IJ SOLN
INTRAMUSCULAR | Status: AC
  Filled 2021-11-24: qty 1

## 2021-11-24 MED ORDER — SODIUM CHLORIDE 0.9% FLUSH: 3.0000 mL | INTRAVENOUS | Status: DC | PRN

## 2021-11-24 MED ORDER — TICAGRELOR 90 MG PO TABS
90.0000 mg | ORAL_TABLET | Freq: Two times a day (BID) | ORAL | Status: DC
  Administered 2021-11-25 – 2021-11-29 (×10): 90 mg via ORAL
  Filled 2021-11-24 (×10): qty 1

## 2021-11-24 MED ORDER — TIROFIBAN (AGGRASTAT) BOLUS VIA INFUSION
INTRAVENOUS | Status: DC | PRN
  Administered 2021-11-24: 1477.5 ug via INTRAVENOUS

## 2021-11-24 MED ORDER — PANTOPRAZOLE SODIUM 40 MG PO TBEC
40.0000 mg | DELAYED_RELEASE_TABLET | Freq: Every day | ORAL | Status: DC
  Administered 2021-11-25 – 2021-12-03 (×9): 40 mg via ORAL
  Filled 2021-11-24 (×9): qty 1

## 2021-11-24 MED ORDER — HEPARIN SODIUM (PORCINE) 1000 UNIT/ML IJ SOLN
INTRAMUSCULAR | Status: AC
  Filled 2021-11-24: qty 10

## 2021-11-24 MED ORDER — NITROGLYCERIN 1 MG/10 ML FOR IR/CATH LAB
INTRA_ARTERIAL | Status: AC
  Filled 2021-11-24: qty 10

## 2021-11-24 MED ORDER — ASPIRIN 81 MG PO CHEW
81.0000 mg | CHEWABLE_TABLET | ORAL | Status: AC
  Administered 2021-11-24: 81 mg via ORAL
  Filled 2021-11-24: qty 1

## 2021-11-24 MED ORDER — TIROFIBAN HCL IN NACL 5-0.9 MG/100ML-% IV SOLN
INTRAVENOUS | Status: AC
  Filled 2021-11-24: qty 100

## 2021-11-24 MED ORDER — SODIUM CHLORIDE 0.9% FLUSH
3.0000 mL | Freq: Two times a day (BID) | INTRAVENOUS | Status: DC
  Administered 2021-11-25 – 2021-11-26 (×3): 3 mL via INTRAVENOUS

## 2021-11-24 MED ORDER — LIDOCAINE HCL (PF) 1 % IJ SOLN
INTRAMUSCULAR | Status: DC | PRN
  Administered 2021-11-24: 20 mL via INTRADERMAL

## 2021-11-24 MED ORDER — ATROPINE SULFATE 1 MG/10ML IJ SOSY
PREFILLED_SYRINGE | INTRAMUSCULAR | Status: AC
  Filled 2021-11-24: qty 10

## 2021-11-24 MED ORDER — SODIUM CHLORIDE 0.9 % IV SOLN: INTRAVENOUS | Status: AC

## 2021-11-24 MED ORDER — HEPARIN BOLUS VIA INFUSION
3500.0000 [IU] | Freq: Once | INTRAVENOUS | Status: AC
  Administered 2021-11-24: 3500 [IU] via INTRAVENOUS
  Filled 2021-11-24: qty 3500

## 2021-11-24 MED ORDER — HEPARIN SODIUM (PORCINE) 1000 UNIT/ML IJ SOLN
INTRAMUSCULAR | Status: DC | PRN
  Administered 2021-11-24: 7000 [IU] via INTRAVENOUS

## 2021-11-24 MED ORDER — HYDRALAZINE HCL 20 MG/ML IJ SOLN: 10.0000 mg | INTRAMUSCULAR | Status: AC | PRN

## 2021-11-24 MED ORDER — SODIUM CHLORIDE 0.9 % IV SOLN: INTRAVENOUS | Status: DC

## 2021-11-24 MED ORDER — TIROFIBAN HCL IN NACL 5-0.9 MG/100ML-% IV SOLN
INTRAVENOUS | Status: DC | PRN
  Administered 2021-11-24: .15 ug/kg/min via INTRAVENOUS

## 2021-11-24 MED ORDER — METHYLPREDNISOLONE SODIUM SUCC 125 MG IJ SOLR
INTRAMUSCULAR | Status: AC
  Filled 2021-11-24: qty 2

## 2021-11-24 MED ORDER — HEPARIN (PORCINE) IN NACL 1000-0.9 UT/500ML-% IV SOLN
INTRAVENOUS | Status: AC
  Filled 2021-11-24: qty 1000

## 2021-11-24 MED ORDER — HEPARIN (PORCINE) 25000 UT/250ML-% IV SOLN
900.0000 [IU]/h | INTRAVENOUS | Status: DC
  Administered 2021-11-24: 900 [IU]/h via INTRAVENOUS
  Filled 2021-11-24: qty 250

## 2021-11-24 MED ORDER — ACETAMINOPHEN 325 MG PO TABS
650.0000 mg | ORAL_TABLET | ORAL | Status: DC | PRN
  Administered 2021-12-02 (×2): 650 mg via ORAL
  Filled 2021-11-24 (×2): qty 2

## 2021-11-24 SURGICAL SUPPLY — 15 items
BALLN SAPPHIRE 2.0X12 (BALLOONS) ×2
BALLOON SAPPHIRE 2.0X12 (BALLOONS) IMPLANT
CATH INFINITI 5FR MULTPACK ANG (CATHETERS) ×1 IMPLANT
CATH LAUNCHER 6FR JR4 (CATHETERS) ×1 IMPLANT
DEVICE SPIDERFX EMB PROT 4MM (WIRE) ×1 IMPLANT
KIT HEART LEFT (KITS) ×3 IMPLANT
PACK CARDIAC CATHETERIZATION (CUSTOM PROCEDURE TRAY) ×3 IMPLANT
SHEATH PINNACLE 5F 10CM (SHEATH) ×1 IMPLANT
SHEATH PINNACLE 6F 10CM (SHEATH) ×1 IMPLANT
STENT ONYX FRONTIER 3.0X18 (Permanent Stent) ×1 IMPLANT
SYR MEDRAD MARK 7 150ML (SYRINGE) ×3 IMPLANT
TRANSDUCER W/STOPCOCK (MISCELLANEOUS) ×3 IMPLANT
TUBING CIL FLEX 10 FLL-RA (TUBING) ×3 IMPLANT
WIRE ASAHI PROWATER 180CM (WIRE) ×1 IMPLANT
WIRE EMERALD 3MM-J .035X150CM (WIRE) ×1 IMPLANT

## 2021-11-24 NOTE — Progress Notes (Signed)
ACT 149. R femoral sheath pulled at 1805. Hemostasis was difficult to achieve. A large hematoma formed. Manual pressure was held for a total of 40 minutes. There was extensive bruising surrounding the site. However, the abdomen and area surrounding the site were soft and non-tender. RN placed dressing over site and contacted Elink for assistance contacting cards to make them aware. During this time hematoma reformed. Manual pressure was held for an additional 20 minutes. After additional manual pressure, site and surrounding area were once again soft and non-tender. The area of ecchymosis was marked and a dressing was placed. During bedside RN shift report, Dr. Cherly Beach rounded on the patient and was made aware of the event. RN instructed to keep patient on bedrest until midnight and NOT to restart heparin gtt overnight.

## 2021-11-24 NOTE — Interval H&P Note (Signed)
Cath Lab Visit (complete for each Cath Lab visit)  Clinical Evaluation Leading to the Procedure:   ACS: No.  Non-ACS:    Anginal Classification: CCS I  Anti-ischemic medical therapy: Maximal Therapy (2 or more classes of medications)  Non-Invasive Test Results: No non-invasive testing performed  Prior CABG: Previous CABG      History and Physical Interval Note:  11/24/2021 11:37 AM  Debbie [name]  has presented today for surgery, with the diagnosis of STEMI.  The various methods of treatment have been discussed with the patient and family. After consideration of risks, benefits and other options for treatment, the patient has consented to  Procedure(s): Coronary/Graft Acute MI Revascularization (N/A) LEFT HEART CATH AND CORONARY ANGIOGRAPHY (N/A) as a surgical intervention.  The patient's history has been reviewed, patient examined, no change in status, stable for surgery.  I have reviewed the patient's chart and labs.  Questions were answered to the patient's satisfaction.     Debbie [name]

## 2021-11-24 NOTE — Progress Notes (Addendum)
EKG from 07/07 1400 showed NSTEMI, 07/08 0737, 2 EKGs performed. Showing ST elevations in multiple leads (V2, V3, V5, V6). Patient is asymptomatic.   EKG tech notified RN at [phone]. Allyson Sabal MD paged via Loretha Stapler. Immediate callback: Provider driving, asked RN to page O'Neal.  [phone] O'Neal MD paged via Amion. Awaiting response.   [phone] Dion Body NP paged. Pt still asymptomatic. ECHO at bedside.  2836 - Rapid response made aware of situation incase patient deteriorates. O'Neal at bedside.   6294 Verbal for STAT troponin. This RN collected at bedside. (Phlebotomy aware, tied up on 2H). See other new orders. Charge RN, Lurline Del aware.   0840 Crash cart at bedside in monitor mode, plugged into red outlets. Pads connected to monitor and patient.   0900- O'Neal stated transfer to ICU. See new orders  0910 - Bed on 2H approved and ready, RN attempted to call report.

## 2021-11-24 NOTE — Progress Notes (Signed)
Pt transferred to [facility]

## 2021-11-24 NOTE — H&P (View-Only) (Signed)
Cardiology Progress Note  Patient ID: Debbie [name] MRN: [id number] DOB: 02/28/1941 Date of Encounter: 11/24/2021  Primary Cardiologist: Samuel McDowell, MD  Subjective   Chief Complaint: None.   HPI: EKG with diffuse ST elevation across the precordial leads.  Her echo shows no wall motion abnormality with EF 25 to 30%.  No LV thrombus.  Left heart catheterization yesterday showed patent LIMA to LAD.  Unclear what is going on.  She reports no chest pain.  Discussed her case with Dr. Barry.  We do feel that repeat angiography is indicated.  Discussed this with her daughter by phone.  She has a slight AKI.  However given ST elevation I think we need to take a look.  ROS:  All other ROS reviewed and negative. Pertinent positives noted in the HPI.     Inpatient Medications  Scheduled Meds:  amLODipine  5 mg Oral Daily   aspirin  81 mg Oral Pre-Cath   aspirin EC  81 mg Oral Q breakfast   atorvastatin  80 mg Oral QHS   bisoprolol  5 mg Oral Daily   cholecalciferol  1,000 Units Oral Daily   ezetimibe  10 mg Oral Daily   famotidine  20 mg Oral Daily   furosemide  80 mg Oral Daily   levothyroxine  88 mcg Oral Daily   lisinopril  20 mg Oral Daily   montelukast  10 mg Oral QHS   potassium chloride  20 mEq Oral BID   sodium chloride flush  3 mL Intravenous Q12H   sodium chloride flush  3 mL Intravenous Q12H   sodium chloride flush  3 mL Intravenous Q12H   ticagrelor  90 mg Oral BID   Continuous Infusions:  sodium chloride     sodium chloride     sodium chloride     sodium chloride     PRN Meds: sodium chloride, sodium chloride, sodium chloride, acetaminophen, albuterol, ALPRAZolam, nitroGLYCERIN, ondansetron (ZOFRAN) IV, mouth rinse, sodium chloride flush, sodium chloride flush, sodium chloride flush, zolpidem   Vital Signs   Vitals:   11/23/21 2258 11/24/21 0000 11/24/21 0240 11/24/21 0759  BP:  (!) 144/79 (!) 144/107 (!) 115/91  Pulse:  86 87 93  Resp:  20 19 17  Temp:  98 F (36.7 C) 98 F (36.7 C) 98.3 F (36.8 C) 97.6 F (36.4 C)  TempSrc: Oral Oral Oral Oral  SpO2:  95% 95% 91%  Weight:   58.2 kg   Height:        Intake/Output Summary (Last 24 hours) at 11/24/2021 0837 Last data filed at 11/24/2021 0801 Gross per 24 hour  Intake 1029.3 ml  Output 950 ml  Net 79.3 ml      11/24/2021    2:40 AM 11/23/2021    4:47 AM 11/22/2021   11:45 PM  Last 3 Weights  Weight (lbs) 128 lb 4.9 oz 129 lb 3 oz 129 lb 3 oz  Weight (kg) 58.2 kg 58.6 kg 58.6 kg      Telemetry  Overnight telemetry shows sinus rhythm, brief SVT with heart rate in the 140s, which I personally reviewed.   ECG  The most recent ECG shows sinus rhythm heart rate 88, anterior ST elevation as well as ST elevation in 1 and aVL, deep T wave inversions in leads V2 through V6, which I personally reviewed.   Physical Exam   Vitals:   11/23/21 2258 11/24/21 0000 11/24/21 0240 11/24/21 0759  BP:  (!) 144/79 (!) 144/107 (!)   115/91  Pulse:  86 87 93  Resp:  20 19 17   Temp: 98 F (36.7 C) 98 F (36.7 C) 98.3 F (36.8 C) 97.6 F (36.4 C)  TempSrc: Oral Oral Oral Oral  SpO2:  95% 95% 91%  Weight:   58.2 kg   Height:        Intake/Output Summary (Last 24 hours) at 11/24/2021 0837 Last data filed at 11/24/2021 0801 Gross per 24 hour  Intake 1029.3 ml  Output 950 ml  Net 79.3 ml       11/24/2021    2:40 AM 11/23/2021    4:47 AM 11/22/2021   11:45 PM  Last 3 Weights  Weight (lbs) 128 lb 4.9 oz 129 lb 3 oz 129 lb 3 oz  Weight (kg) 58.2 kg 58.6 kg 58.6 kg    Body mass index is 23.09 kg/m.  General: Well nourished, well developed, in no acute distress Head: Atraumatic, normal size  Eyes: PEERLA, EOMI  Neck: Supple, no JVD Endocrine: No thryomegaly Cardiac: Normal S1, S2; RRR; no murmurs, rubs, or gallops Lungs: Clear to auscultation bilaterally, no wheezing, rhonchi or rales  Abd: Soft, nontender, no hepatomegaly  Ext: No edema, pulses 2+, right femoral cath site clean and dry with oozing,  2+ pulse, no hematoma Musculoskeletal: No deformities, BUE and BLE strength normal and equal Skin: Warm and dry, no rashes   Neuro: Alert and oriented to person, place, time, and situation, CNII-XII grossly intact, no focal deficits  Psych: Normal mood and affect   Labs  High Sensitivity Troponin:   Recent Labs  Lab 11/22/21 1552 11/22/21 1752  TROPONINIHS 208* 420*     Cardiac EnzymesNo results for input(s): "TROPONINI" in the last 168 hours. No results for input(s): "TROPIPOC" in the last 168 hours.  Chemistry Recent Labs  Lab 11/22/21 1552 11/22/21 2230 11/23/21 0049 11/24/21 0307  NA 142  --  140 142  K 3.4*  --  4.0 3.5  CL 106  --  102 103  CO2 27  --  26 24  GLUCOSE 122*  --  174* 180*  BUN 13  --  13 20  CREATININE 0.94  --  1.03* 1.42*  CALCIUM 9.2  --  9.4 10.0  PROT  --  5.6*  --   --   ALBUMIN  --  3.2*  --   --   AST  --  43*  --   --   ALT  --  16  --   --   ALKPHOS  --  60  --   --   BILITOT  --  0.9  --   --   GFRNONAA >60  --  55* 37*  ANIONGAP 9  --  12 15    Hematology Recent Labs  Lab 11/22/21 1552 11/23/21 0049 11/24/21 0307  WBC 8.1 10.9* 22.9*  RBC 4.69 4.59 5.28*  HGB 13.3 12.9 15.0  HCT 41.0 39.4 44.6  MCV 87.4 85.8 84.5  MCH 28.4 28.1 28.4  MCHC 32.4 32.7 33.6  RDW 14.9 14.7 14.9  PLT 447* 421* 481*   BNPNo results for input(s): "BNP", "PROBNP" in the last 168 hours.  DDimer No results for input(s): "DDIMER" in the last 168 hours.   Radiology  CARDIAC CATHETERIZATION  Result Date: 11/23/2021 1.  Severe native vessel coronary artery disease with severe ostial left main stenosis, total occlusion of the LAD, severe stenosis in the distal left main/circumflex junction, severe diffuse stenosis of the distal  AV circumflex, and a diffusely diseased nondominant RCA 2.  Status post aortocoronary bypass surgery with continued patency of the LIMA to LAD and occlusion of the saphenous vein grafts 3.  Successful PCI of the native left main and  native circumflex using a 4.0 x 24 mm Synergy DES Recommendation: Dual antiplatelet therapy with aspirin and ticagrelor at least 12 months, aggressive risk reduction measures, suspect the patient occluded a vein graft causing her acute coronary syndrome.  There were no vein graft targets for PCI.  Her native left main into the circumflex is treated as this large territory is now potentially ischemic after occlusion of the sequential saphenous vein graft to OM and PLA.   DG Chest Port 1 View  Result Date: 11/22/2021 CLINICAL DATA:  Chest pain with weakness. EXAM: PORTABLE CHEST 1 VIEW COMPARISON:  Radiographs 12/31/2018 and 10/17/2021. Abdominal CT 10/17/2021. FINDINGS: 1644 hours. The heart size and mediastinal contours are stable status post median sternotomy. There is aortic atherosclerosis and a small hiatal hernia. Lower lung volumes with mildly increased patchy opacities at both lung bases, likely reflecting atelectasis. No confluent airspace opacity, significant pleural effusion or pneumothorax. No acute osseous findings are evident. Glenohumeral degenerative changes bilaterally and a thoracolumbar scoliosis. Telemetry leads overlie the chest. IMPRESSION: 1. Lower lung volumes with probable resulting mild bibasilar atelectasis. No confluent airspace opacity or significant pleural effusion. 2. Aortic atherosclerosis post median sternotomy. Electronically Signed   By: Carey Bullocks M.D.   On: 11/22/2021 16:58    Cardiac Studies  LHC 11/23/2021 1.  Severe native vessel coronary artery disease with severe ostial left main stenosis, total occlusion of the LAD, severe stenosis in the distal left main/circumflex junction, severe diffuse stenosis of the distal AV circumflex, and a diffusely diseased nondominant RCA 2.  Status post aortocoronary bypass surgery with continued patency of the LIMA to LAD and occlusion of the saphenous vein grafts 3.  Successful PCI of the native left main and native circumflex  using a 4.0 x 24 mm Synergy DES  Recommendation: Dual antiplatelet therapy with aspirin and ticagrelor at least 12 months, aggressive risk reduction measures, suspect the patient occluded a vein graft causing her acute coronary syndrome.  There were no vein graft targets for PCI.  Her native left main into the circumflex is treated as this large territory is now potentially ischemic after occlusion of the sequential saphenous vein graft to OM and PLA.  Patient Profile  JAMESE TRAUGER is a 81 y.o. female with CAD status post CABG, hypertension, hyperlipidemia who was admitted on 11/22/2021 for non-STEMI.  Underwent left heart catheterization with PCI in the left main into left circumflex.  Course complicated on 11/24/2021 with anterior ST elevation in no wall motion abnormality.  Assessment & Plan   #Non-STEMI #CAD status post CABG -New ST elevation this morning.  Echocardiogram shows anterior wall is severely hypokinetic.  Start heparin drip.  On aspirin and Brilinta due to intervention of the left main into left circumflex. -Currently on high intensity statin. -Her ST elevation is concerning.  She has no chest pain but her echo does show anterior wall is now severely hypokinetic compared with EKG.  Does not appear to be a stress-induced pattern.  Discussed her case with interventional cardiology.  We will have to proceed with repeat angiography.  She did eat breakfast.  She will be n.p.o. now.  We will restart heparin drip.  Case was discussed with nursing. -Continue beta-blocker. -Hold ACE inhibitor. -Gentle hydration for heart  cath today. -Case discussed with the patient and her daughter by phone.  They are both willing to proceed with heart catheterization.  See consent statement below.  #Anterior ST elevation -No chest pain.  EKG now with diffuse anterior ST elevation.  Does not appear to represent pericarditis.  Echocardiogram shows anterior wall is completely severely hypokinetic.   Proceed with coronary angiography today.  NPO. -repeat troponin -Transfer to ICU today.   #Ischemic CM -EF now reduced at 30% with regional wall motion abnormality LAD distribution.  LAD was patent yesterday.  Repeat angiography today. -Hold ACE inhibitor in setting of AKI. -No signs of shock.  Continue beta-blocker. -Optimize medical therapy after catheterization today. -Stop amlodipine.  Start ACE inhibitor due to heart cath today.  Likely will plan for beta-blocker and/or hydralazine.  We will see what her kidney function does.  #AKI -Did have slight bump in creatinine today.  Now with ST elevation.  Regional wall motion abnormality concerning for LAD distribution.  Plan for left heart catheterization today.  We will give gentle hydration.  Hold any nephrotoxic agents.  Unfortunately I believe we have to proceed with angiography.  We will have to accept any potential damage to kidneys.  #SVT -Brief SVT overnight.  Does not represent atrial fibrillation on my review.  We will monitor this.  No further recurrence.  #AKI -Slight AKI likely due to contrast load.  Gentle hydration.  Repeat angiography today. -Hold ACE inhibitor  #HLD -continue statin/zetia  #COPD -stable.  #Hypothyroidism -home synthroid.  Recent TSH 7.4.  T4 normal.  Continue home Synthroid.  Does not need adjustment.  TSH is coming down.  #Leukocytosis -Secondary to MI. -No signs of infection.  #Memory impairment -Confusion overnight.  No focal deficits.  Discussion with daughter indicates that she is having memory issues at home.  She is getting lost while driving.  Suspect there is underlying dementia.  She is okay for her mother to have repeat angiography today.  Shared Decision Making/Informed Consent The risks [stroke (1 in 1000), death (1 in 1000), kidney failure [usually temporary] (1 in 500), bleeding (1 in 200), allergic reaction [possibly serious] (1 in 200)], benefits (diagnostic support and  management of coronary artery disease) and alternatives of a cardiac catheterization were discussed in detail with Ms. Pandolfi and she is willing to proceed.   CRITICAL CARE Performed by: Gerri Spore T O'Neal  Total critical care time: 45 minutes. Critical care time was exclusive of separately billable procedures and treating other patients. Critical care was necessary to treat or prevent imminent or life-threatening deterioration. Critical care was time spent personally by me on the following activities: development of treatment plan with patient and/or surrogate as well as nursing, discussions with consultants, evaluation of patient's response to treatment, examination of patient, obtaining history from patient or surrogate, ordering and performing treatments and interventions, ordering and review of laboratory studies, ordering and review of radiographic studies, pulse oximetry and re-evaluation of patient's condition.  For questions or updates, please contact [facility] Please consult [email or web address] for contact info under    Signed, Gerri Spore T. Flora Lipps, MD, [facility] [place]  [facility]  11/24/2021 8:37 AM

## 2021-11-24 NOTE — Progress Notes (Addendum)
ANTICOAGULATION CONSULT NOTE - Follow Up Consult  Pharmacy Consult for IV heparin Indication: chest pain/ACS  Allergies  Allergen Reactions   Ivp Dye [Iodinated Contrast Media] Swelling    Kidney Dye   Betadine [Povidone Iodine] Rash   Codeine Nausea And Vomiting   Other Nausea And Vomiting    Narcotics - nausea,vomiting   Penicillins Rash    Did it involve swelling of the face/tongue/throat, SOB, or low BP? No Did it involve sudden or severe rash/hives, skin peeling, or any reaction on the inside of your mouth or nose? No Did you need to seek medical attention at a hospital or doctor's office? No When did it last happen?       If all above answers are "NO", may proceed with cephalosporin use.    Sulfa Antibiotics Rash    Patient Measurements: Height: 5' 2.5" (158.8 cm) Weight: 58.2 kg (128 lb 4.9 oz) IBW/kg (Calculated) : 51.25 Heparin Dosing Weight: 58.6  Vital Signs: Temp: 97.6 F (36.4 C) (07/08 0759) Temp Source: Oral (07/08 0759) BP: 115/91 (07/08 0759) Pulse Rate: 93 (07/08 0759)  Labs: Recent Labs    11/22/21 1552 11/22/21 1752 11/23/21 0049 11/23/21 0815 11/24/21 0307  HGB 13.3  --  12.9  --  15.0  HCT 41.0  --  39.4  --  44.6  PLT 447*  --  421*  --  481*  HEPARINUNFRC  --   --  0.52 0.22*  --   CREATININE 0.94  --  1.03*  --  1.42*  TROPONINIHS 208* 420*  --   --   --      Estimated Creatinine Clearance: 25.6 mL/min (A) (by C-G formula based on SCr of 1.42 mg/dL (H)).   Medications:  Infusions:   sodium chloride     sodium chloride      Assessment: 80 YOF with NSTEMI. Not on anticoagulant PTA. Hgb and PLTs wnl and stable. EKG from 07/07 1400 showed NSTEMI, 07/08 0737. Showing ST elevations in multiple leads (V2, V3, V5, V6). Patient is asymptomatic. Pt went to cath on 7/7 where left main stent was placed and bASA/Brillinta was started. PT will return to cath today for evaluation of changes s/p ST elevations. CBC stable.  Goal of Therapy:   Heparin level 0.3-0.7 units/ml Monitor platelets by anticoagulation protocol: Yes   Plan:  Bolus 3500 units heparin Resume heparin near previous rate at 900 units/hr Obtain heparin level in 8 hrs and daily while on heparin  F/u plans post cath Continue to monitor H&H and platelets  Thank you for allowing pharmacy to participate in this patient's care.  Enos Fling, PharmD PGY1 Pharmacy Resident 11/24/2021 8:36 AM Check [email or web address] for unit specific pharmacy number

## 2021-11-24 NOTE — Progress Notes (Signed)
ANTICOAGULATION CONSULT NOTE - Follow Up Consult  Pharmacy Consult for IV heparin Indication: chest pain/ACS  Allergies  Allergen Reactions   Ivp Dye [Iodinated Contrast Media] Swelling    Kidney Dye   Betadine [Povidone Iodine] Rash   Codeine Nausea And Vomiting   Other Nausea And Vomiting    Narcotics - nausea,vomiting   Penicillins Rash    Did it involve swelling of the face/tongue/throat, SOB, or low BP? No Did it involve sudden or severe rash/hives, skin peeling, or any reaction on the inside of your mouth or nose? No Did you need to seek medical attention at a hospital or doctor's office? No When did it last happen?       If all above answers are "NO", may proceed with cephalosporin use.    Sulfa Antibiotics Rash    Patient Measurements: Height: 5\' 2"  (157.5 cm) Weight: 59.1 kg (130 lb 4.7 oz) IBW/kg (Calculated) : 50.1 Heparin Dosing Weight: 58.6  Vital Signs: Temp: 97.5 F (36.4 C) (07/08 0900) Temp Source: Axillary (07/08 0900) BP: 136/82 (07/08 1515) Pulse Rate: 75 (07/08 1530)  Labs: Recent Labs    11/22/21 1552 11/22/21 1752 11/23/21 0049 11/23/21 0815 11/24/21 0307 11/24/21 0830  HGB 13.3  --  12.9  --  15.0  --   HCT 41.0  --  39.4  --  44.6  --   PLT 447*  --  421*  --  481*  --   HEPARINUNFRC  --   --  0.52 0.22*  --   --   CREATININE 0.94  --  1.03*  --  1.42*  --   TROPONINIHS 208* 420*  --   --   --  >24,000*     Estimated Creatinine Clearance: 25 mL/min (A) (by C-G formula based on SCr of 1.42 mg/dL (H)).   Medications:  Infusions:   sodium chloride     sodium chloride     sodium chloride 75 mL/hr at 11/24/21 1500   sodium chloride      Assessment: 80 YOF with NSTEMI. Not on anticoagulant PTA. Hgb and PLTs wnl and stable. EKG from 07/07 1400 showed NSTEMI, 07/08 0737. Showing ST elevations in multiple leads (V2, V3, V5, V6). Patient is asymptomatic. Pt went to cath on 7/7 where left main stent was placed and bASA/Brillinta was  started. She returned to cath 7/8 due to EKG changes and a stent was placed.  Heparin to start 4 hours after sheath pull and to continue for 24 hours   Goal of Therapy:  Heparin level 0.3-0.7 units/ml Monitor platelets by anticoagulation protocol: Yes   Plan:  -resume heparin at 900 units/hr 4 hours after sheath removal -Heparin level in 8 hours and daily wth CBC daily -Heparin to stop in 24 hours  9/7, PharmD Clinical Pharmacist **Pharmacist phone directory can now be found on [email or web address] ([email or web address]).  Listed under [facility].

## 2021-11-24 NOTE — Progress Notes (Addendum)
Cardiology Progress Note  Patient ID: Debbie [name] MRN: [id number] DOB: 1941-03-16 Date of Encounter: 11/24/2021  Primary Cardiologist: [name], MD  Subjective   Chief Complaint: None.   HPI: EKG with diffuse ST elevation across the precordial leads.  Her echo shows no wall motion abnormality with EF 25 to 30%.  No LV thrombus.  Left heart catheterization yesterday showed patent LIMA to LAD.  Unclear what is going on.  She reports no chest pain.  Discussed her case with Dr. Gery Pray.  We do feel that repeat angiography is indicated.  Discussed this with her daughter by phone.  She has a slight AKI.  However given ST elevation I think we need to take a look.  ROS:  All other ROS reviewed and negative. Pertinent positives noted in the HPI.     Inpatient Medications  Scheduled Meds:  amLODipine  5 mg Oral Daily   aspirin  81 mg Oral Pre-Cath   aspirin EC  81 mg Oral Q breakfast   atorvastatin  80 mg Oral QHS   bisoprolol  5 mg Oral Daily   cholecalciferol  1,000 Units Oral Daily   ezetimibe  10 mg Oral Daily   famotidine  20 mg Oral Daily   furosemide  80 mg Oral Daily   levothyroxine  88 mcg Oral Daily   lisinopril  20 mg Oral Daily   montelukast  10 mg Oral QHS   potassium chloride  20 mEq Oral BID   sodium chloride flush  3 mL Intravenous Q12H   sodium chloride flush  3 mL Intravenous Q12H   sodium chloride flush  3 mL Intravenous Q12H   ticagrelor  90 mg Oral BID   Continuous Infusions:  sodium chloride     sodium chloride     sodium chloride     sodium chloride     PRN Meds: sodium chloride, sodium chloride, sodium chloride, acetaminophen, albuterol, ALPRAZolam, nitroGLYCERIN, ondansetron (ZOFRAN) IV, mouth rinse, sodium chloride flush, sodium chloride flush, sodium chloride flush, zolpidem   Vital Signs   Vitals:   11/23/21 2258 11/24/21 0000 11/24/21 0240 11/24/21 0759  BP:  (!) 144/79 (!) 144/107 (!) 115/91  Pulse:  86 87 93  Resp:  20 19 17   Temp:  98 F (36.7 C) 98 F (36.7 C) 98.3 F (36.8 C) 97.6 F (36.4 C)  TempSrc: Oral Oral Oral Oral  SpO2:  95% 95% 91%  Weight:   58.2 kg   Height:        Intake/Output Summary (Last 24 hours) at 11/24/2021 0837 Last data filed at 11/24/2021 0801 Gross per 24 hour  Intake 1029.3 ml  Output 950 ml  Net 79.3 ml      11/24/2021    2:40 AM 11/23/2021    4:47 AM 11/22/2021   11:45 PM  Last 3 Weights  Weight (lbs) 128 lb 4.9 oz 129 lb 3 oz 129 lb 3 oz  Weight (kg) 58.2 kg 58.6 kg 58.6 kg      Telemetry  Overnight telemetry shows sinus rhythm, brief SVT with heart rate in the 140s, which I personally reviewed.   ECG  The most recent ECG shows sinus rhythm heart rate 88, anterior ST elevation as well as ST elevation in 1 and aVL, deep T wave inversions in leads V2 through V6, which I personally reviewed.   Physical Exam   Vitals:   11/23/21 2258 11/24/21 0000 11/24/21 0240 11/24/21 0759  BP:  (!) 144/79 (!) 144/107 [name])  115/91  Pulse:  86 87 93  Resp:  20 19 17   Temp: 98 F (36.7 C) 98 F (36.7 C) 98.3 F (36.8 C) 97.6 F (36.4 C)  TempSrc: Oral Oral Oral Oral  SpO2:  95% 95% 91%  Weight:   58.2 kg   Height:        Intake/Output Summary (Last 24 hours) at 11/24/2021 0837 Last data filed at 11/24/2021 0801 Gross per 24 hour  Intake 1029.3 ml  Output 950 ml  Net 79.3 ml       11/24/2021    2:40 AM 11/23/2021    4:47 AM 11/22/2021   11:45 PM  Last 3 Weights  Weight (lbs) 128 lb 4.9 oz 129 lb 3 oz 129 lb 3 oz  Weight (kg) 58.2 kg 58.6 kg 58.6 kg    Body mass index is 23.09 kg/m.  General: Well nourished, well developed, in no acute distress Head: Atraumatic, normal size  Eyes: PEERLA, EOMI  Neck: Supple, no JVD Endocrine: No thryomegaly Cardiac: Normal S1, S2; RRR; no murmurs, rubs, or gallops Lungs: Clear to auscultation bilaterally, no wheezing, rhonchi or rales  Abd: Soft, nontender, no hepatomegaly  Ext: No edema, pulses 2+, right femoral cath site clean and dry with oozing,  2+ pulse, no hematoma Musculoskeletal: No deformities, BUE and BLE strength normal and equal Skin: Warm and dry, no rashes   Neuro: Alert and oriented to person, place, time, and situation, CNII-XII grossly intact, no focal deficits  Psych: Normal mood and affect   Labs  High Sensitivity Troponin:   Recent Labs  Lab 11/22/21 1552 11/22/21 1752  TROPONINIHS 208* 420*     Cardiac EnzymesNo results for input(s): "TROPONINI" in the last 168 hours. No results for input(s): "TROPIPOC" in the last 168 hours.  Chemistry Recent Labs  Lab 11/22/21 1552 11/22/21 2230 11/23/21 0049 11/24/21 0307  NA 142  --  140 142  K 3.4*  --  4.0 3.5  CL 106  --  102 103  CO2 27  --  26 24  GLUCOSE 122*  --  174* 180*  BUN 13  --  13 20  CREATININE 0.94  --  1.03* 1.42*  CALCIUM 9.2  --  9.4 10.0  PROT  --  5.6*  --   --   ALBUMIN  --  3.2*  --   --   AST  --  43*  --   --   ALT  --  16  --   --   ALKPHOS  --  60  --   --   BILITOT  --  0.9  --   --   GFRNONAA >60  --  55* 37*  ANIONGAP 9  --  12 15    Hematology Recent Labs  Lab 11/22/21 1552 11/23/21 0049 11/24/21 0307  WBC 8.1 10.9* 22.9*  RBC 4.69 4.59 5.28*  HGB 13.3 12.9 15.0  HCT 41.0 39.4 44.6  MCV 87.4 85.8 84.5  MCH 28.4 28.1 28.4  MCHC 32.4 32.7 33.6  RDW 14.9 14.7 14.9  PLT 447* 421* 481*   BNPNo results for input(s): "BNP", "PROBNP" in the last 168 hours.  DDimer No results for input(s): "DDIMER" in the last 168 hours.   Radiology  CARDIAC CATHETERIZATION  Result Date: 11/23/2021 1.  Severe native vessel coronary artery disease with severe ostial left main stenosis, total occlusion of the LAD, severe stenosis in the distal left main/circumflex junction, severe diffuse stenosis of the distal  AV circumflex, and a diffusely diseased nondominant RCA 2.  Status post aortocoronary bypass surgery with continued patency of the LIMA to LAD and occlusion of the saphenous vein grafts 3.  Successful PCI of the native left main and  native circumflex using a 4.0 x 24 mm Synergy DES Recommendation: Dual antiplatelet therapy with aspirin and ticagrelor at least 12 months, aggressive risk reduction measures, suspect the patient occluded a vein graft causing her acute coronary syndrome.  There were no vein graft targets for PCI.  Her native left main into the circumflex is treated as this large territory is now potentially ischemic after occlusion of the sequential saphenous vein graft to OM and PLA.   DG Chest Port 1 View  Result Date: 11/22/2021 CLINICAL DATA:  Chest pain with weakness. EXAM: PORTABLE CHEST 1 VIEW COMPARISON:  Radiographs 12/31/2018 and 10/17/2021. Abdominal CT 10/17/2021. FINDINGS: 1644 hours. The heart size and mediastinal contours are stable status post median sternotomy. There is aortic atherosclerosis and a small hiatal hernia. Lower lung volumes with mildly increased patchy opacities at both lung bases, likely reflecting atelectasis. No confluent airspace opacity, significant pleural effusion or pneumothorax. No acute osseous findings are evident. Glenohumeral degenerative changes bilaterally and a thoracolumbar scoliosis. Telemetry leads overlie the chest. IMPRESSION: 1. Lower lung volumes with probable resulting mild bibasilar atelectasis. No confluent airspace opacity or significant pleural effusion. 2. Aortic atherosclerosis post median sternotomy. Electronically Signed   By: Carey Bullocks M.D.   On: 11/22/2021 16:58    Cardiac Studies  LHC 11/23/2021 1.  Severe native vessel coronary artery disease with severe ostial left main stenosis, total occlusion of the LAD, severe stenosis in the distal left main/circumflex junction, severe diffuse stenosis of the distal AV circumflex, and a diffusely diseased nondominant RCA 2.  Status post aortocoronary bypass surgery with continued patency of the LIMA to LAD and occlusion of the saphenous vein grafts 3.  Successful PCI of the native left main and native circumflex  using a 4.0 x 24 mm Synergy DES  Recommendation: Dual antiplatelet therapy with aspirin and ticagrelor at least 12 months, aggressive risk reduction measures, suspect the patient occluded a vein graft causing her acute coronary syndrome.  There were no vein graft targets for PCI.  Her native left main into the circumflex is treated as this large territory is now potentially ischemic after occlusion of the sequential saphenous vein graft to OM and PLA.  Patient Profile  Debbie [name] is a 81 y.o. female with CAD status post CABG, hypertension, hyperlipidemia who was admitted on 11/22/2021 for non-STEMI.  Underwent left heart catheterization with PCI in the left main into left circumflex.  Course complicated on 11/24/2021 with anterior ST elevation in no wall motion abnormality.  Assessment & Plan   #Non-STEMI #CAD status post CABG -New ST elevation this morning.  Echocardiogram shows anterior wall is severely hypokinetic.  Start heparin drip.  On aspirin and Brilinta due to intervention of the left main into left circumflex. -Currently on high intensity statin. -Her ST elevation is concerning.  She has no chest pain but her echo does show anterior wall is now severely hypokinetic compared with EKG.  Does not appear to be a stress-induced pattern.  Discussed her case with interventional cardiology.  We will have to proceed with repeat angiography.  She did eat breakfast.  She will be n.p.o. now.  We will restart heparin drip.  Case was discussed with nursing. -Continue beta-blocker. -Hold ACE inhibitor. -Gentle hydration for heart  cath today. -Case discussed with the patient and her daughter by phone.  They are both willing to proceed with heart catheterization.  See consent statement below.  #Anterior ST elevation -No chest pain.  EKG now with diffuse anterior ST elevation.  Does not appear to represent pericarditis.  Echocardiogram shows anterior wall is completely severely hypokinetic.   Proceed with coronary angiography today.  NPO. -repeat troponin -Transfer to ICU today.   #Ischemic CM -EF now reduced at 30% with regional wall motion abnormality LAD distribution.  LAD was patent yesterday.  Repeat angiography today. -Hold ACE inhibitor in setting of AKI. -No signs of shock.  Continue beta-blocker. -Optimize medical therapy after catheterization today. -Stop amlodipine.  Start ACE inhibitor due to heart cath today.  Likely will plan for beta-blocker and/or hydralazine.  We will see what her kidney function does.  #AKI -Did have slight bump in creatinine today.  Now with ST elevation.  Regional wall motion abnormality concerning for LAD distribution.  Plan for left heart catheterization today.  We will give gentle hydration.  Hold any nephrotoxic agents.  Unfortunately I believe we have to proceed with angiography.  We will have to accept any potential damage to kidneys.  #SVT -Brief SVT overnight.  Does not represent atrial fibrillation on my review.  We will monitor this.  No further recurrence.  #AKI -Slight AKI likely due to contrast load.  Gentle hydration.  Repeat angiography today. -Hold ACE inhibitor  #HLD -continue statin/zetia  #COPD -stable.  #Hypothyroidism -home synthroid.  Recent TSH 7.4.  T4 normal.  Continue home Synthroid.  Does not need adjustment.  TSH is coming down.  #Leukocytosis -Secondary to MI. -No signs of infection.  #Memory impairment -Confusion overnight.  No focal deficits.  Discussion with daughter indicates that she is having memory issues at home.  She is getting lost while driving.  Suspect there is underlying dementia.  She is okay for her mother to have repeat angiography today.  Shared Decision Making/Informed Consent The risks [stroke (1 in 1000), death (1 in 1000), kidney failure [usually temporary] (1 in 500), bleeding (1 in 200), allergic reaction [possibly serious] (1 in 200)], benefits (diagnostic support and  management of coronary artery disease) and alternatives of a cardiac catheterization were discussed in detail with Debbie [name] and she is willing to proceed.   CRITICAL CARE Performed by: Gerri Spore T O'Neal  Total critical care time: 45 minutes. Critical care time was exclusive of separately billable procedures and treating other patients. Critical care was necessary to treat or prevent imminent or life-threatening deterioration. Critical care was time spent personally by me on the following activities: development of treatment plan with patient and/or surrogate as well as nursing, discussions with consultants, evaluation of patient's response to treatment, examination of patient, obtaining history from patient or surrogate, ordering and performing treatments and interventions, ordering and review of laboratory studies, ordering and review of radiographic studies, pulse oximetry and re-evaluation of patient's condition.  For questions or updates, please contact [facility] Please consult [email or web address] for contact info under    Signed, Gerri Spore T. Flora Lipps, MD, [facility] [place]  [facility]  11/24/2021 8:37 AM

## 2021-11-24 NOTE — Progress Notes (Addendum)
Paged by nursing for groin bleed following cath. Initially presented with NSTEMI as transfer from [facility] (11/22/21). hsT 208->420. She went for coronary angiography 07/07 with severe triple vessel dz s/p aortocoronary bypass with patent LIMA-LAD and occlusion of SVG to OM and PLA. Native LM was PCI with single DES as it was suspected that losing one of her vein grafts caused her ischemia but there were no targets for PCI int he SVGs. EF 25-30% without focal WMAs. She had new anterior ST elevation today (07/08) so she was taken for repeat coronary angiography as her echo showed anterior wall that was severly hypokinetic. On repeat angiography the SVG to OM and PDA was patent. The vein to the diagonal branch was subtotally occluded with 99% stenosis and backfilled a diag branch to the LAD which was thought to be culprit vessel.   VS during my assessment: P 73, BP 105/69 (81), RR 16, O2 96%/RA  She was already taking ASA/ticag and was given bolus heparin and Aggrastat.   Following sheath re,moval (ACT was < 170) she developed R groin bleeding and hematoma formation requiring manual compression for ~ 40 minutes with resolution of hematoma. Subsequently the hematoma started to bleed again and became hard. Additional 10-20 minutes for hemostasis.   She is currently off heparin. The initial plan was to resume 4 hours post sheath pull and continue for 24h. She still has doppler pulses at R DPA. Hematoma has been marked and is not expanding. Will recheck Hb now. VS stable and patient not in acute pain. Will assess again at [facility]. Will not plan on resuming heparin now given large hematoma that is now soft and controlled.

## 2021-11-24 NOTE — Care Management Obs Status (Signed)
MEDICARE OBSERVATION STATUS NOTIFICATION   Patient Details  Name: Debbie [name] MRN: [id number] Date of Birth: [birth year]   Medicare Observation Status Notification Given:  Yes    Kanitra Purifoy G., RN 11/24/2021, 9:18 AM

## 2021-11-25 ENCOUNTER — Inpatient Hospital Stay (HOSPITAL_COMMUNITY): Payer: Medicare HMO

## 2021-11-25 DIAGNOSIS — I724 Aneurysm of artery of lower extremity: Secondary | ICD-10-CM | POA: Diagnosis not present

## 2021-11-25 DIAGNOSIS — I214 Non-ST elevation (NSTEMI) myocardial infarction: Secondary | ICD-10-CM | POA: Diagnosis not present

## 2021-11-25 LAB — CBC
HCT: 36.1 % (ref 36.0–46.0)
HCT: 33.3 % — ABNORMAL LOW (ref 36.0–46.0)
Hemoglobin: 11.4 g/dL — ABNORMAL LOW (ref 12.0–15.0)
Hemoglobin: 10.4 g/dL — ABNORMAL LOW (ref 12.0–15.0)
MCH: 27.7 pg (ref 26.0–34.0)
MCH: 27.9 pg (ref 26.0–34.0)
MCHC: 31.6 g/dL (ref 30.0–36.0)
MCHC: 31.2 g/dL (ref 30.0–36.0)
MCV: 87.8 fL (ref 80.0–100.0)
MCV: 89.3 fL (ref 80.0–100.0)
Platelets: 323 10*3/uL (ref 150–400)
Platelets: 293 10*3/uL (ref 150–400)
RBC: 4.11 MIL/uL (ref 3.87–5.11)
RBC: 3.73 MIL/uL — ABNORMAL LOW (ref 3.87–5.11)
RDW: 15.5 % (ref 11.5–15.5)
RDW: 15.6 % — ABNORMAL HIGH (ref 11.5–15.5)
WBC: 15.8 10*3/uL — ABNORMAL HIGH (ref 4.0–10.5)
WBC: 12.5 10*3/uL — ABNORMAL HIGH (ref 4.0–10.5)
nRBC: 0.1 % (ref 0.0–0.2)
nRBC: 0 % (ref 0.0–0.2)

## 2021-11-25 LAB — BASIC METABOLIC PANEL
Anion gap: 10 (ref 5–15)
BUN: 35 mg/dL — ABNORMAL HIGH (ref 8–23)
CO2: 23 mmol/L (ref 22–32)
Calcium: 8.8 mg/dL — ABNORMAL LOW (ref 8.9–10.3)
Chloride: 109 mmol/L (ref 98–111)
Creatinine, Ser: 1.7 mg/dL — ABNORMAL HIGH (ref 0.44–1.00)
GFR, Estimated: 30 mL/min — ABNORMAL LOW (ref >60)
Glucose, Bld: 177 mg/dL — ABNORMAL HIGH (ref 70–99)
Potassium: 4.3 mmol/L (ref 3.5–5.1)
Sodium: 142 mmol/L (ref 135–145)

## 2021-11-25 LAB — PROCALCITONIN: Procalcitonin: 0.52 ng/mL

## 2021-11-25 MED ORDER — HEPARIN (PORCINE) 25000 UT/250ML-% IV SOLN: 900.0000 [IU]/h | INTRAVENOUS | Status: DC

## 2021-11-25 NOTE — Progress Notes (Addendum)
Cardiology Progress Note  Patient ID: Debbie [name] MRN: [id number] DOB: [birth year] Date of Encounter: 11/25/2021  Primary Cardiologist: Rozann Lesches, MD  Subjective   Chief Complaint: Groin pain  HPI: Hematoma noted at the right femoral cath site.  Heparin was stopped overnight.  Need to restart given LV thrombus.  Reports no chest pain.  ROS:  All other ROS reviewed and negative. Pertinent positives noted in the HPI.     Inpatient Medications  Scheduled Meds:  aspirin  81 mg Oral Daily   atorvastatin  80 mg Oral QHS   Chlorhexidine Gluconate Cloth  6 each Topical Daily   cholecalciferol  1,000 Units Oral Daily   ezetimibe  10 mg Oral Daily   levothyroxine  88 mcg Oral Daily   montelukast  10 mg Oral QHS   pantoprazole  40 mg Oral Daily   sodium chloride flush  3 mL Intravenous Q12H   sodium chloride flush  3 mL Intravenous Q12H   sodium chloride flush  3 mL Intravenous Q12H   ticagrelor  90 mg Oral BID   Continuous Infusions:  sodium chloride     sodium chloride     sodium chloride     PRN Meds: sodium chloride, sodium chloride, sodium chloride, acetaminophen, albuterol, ALPRAZolam, nitroGLYCERIN, ondansetron (ZOFRAN) IV, mouth rinse, sodium chloride flush, sodium chloride flush, sodium chloride flush, zolpidem   Vital Signs   Vitals:   11/25/21 0600 11/25/21 0603 11/25/21 0615 11/25/21 0630  BP: (!) 87/56 (!) 101/55 104/63 98/64  Pulse: (!) 55 61 60 (!) 59  Resp: (!) 21 17 (!) 21 15  Temp:      TempSrc:      SpO2: 93% 93% 97% 98%  Weight:      Height:        Intake/Output Summary (Last 24 hours) at 11/25/2021 0706 Last data filed at 11/25/2021 0600 Gross per 24 hour  Intake 2267.96 ml  Output 1475 ml  Net 792.96 ml      11/25/2021    5:00 AM 11/24/2021    9:00 AM 11/24/2021    2:40 AM  Last 3 Weights  Weight (lbs) 130 lb 4.7 oz 130 lb 4.7 oz 128 lb 4.9 oz  Weight (kg) 59.1 kg 59.1 kg 58.2 kg      Telemetry  Overnight telemetry shows sinus  bradycardia heart rate 40-60 bpm, which I personally reviewed.   ECG  The most recent ECG shows sinus bradycardia heart rate 58, persistent ST elevation in leads V2 through V4 with T wave inversions, ST elevation has resolved, which I personally reviewed.   Physical Exam   Vitals:   11/25/21 0600 11/25/21 0603 11/25/21 0615 11/25/21 0630  BP: (!) 87/56 (!) 101/55 104/63 98/64  Pulse: (!) 55 61 60 (!) 59  Resp: (!) 21 17 (!) 21 15  Temp:      TempSrc:      SpO2: 93% 93% 97% 98%  Weight:      Height:        Intake/Output Summary (Last 24 hours) at 11/25/2021 0706 Last data filed at 11/25/2021 0600 Gross per 24 hour  Intake 2267.96 ml  Output 1475 ml  Net 792.96 ml       11/25/2021    5:00 AM 11/24/2021    9:00 AM 11/24/2021    2:40 AM  Last 3 Weights  Weight (lbs) 130 lb 4.7 oz 130 lb 4.7 oz 128 lb 4.9 oz  Weight (kg) 59.1 kg 59.1 kg 58.2  kg    Body mass index is 23.83 kg/m.  General: Well nourished, well developed, in no acute distress Head: Atraumatic, normal size  Eyes: PEERLA, EOMI  Neck: Supple, no JVD Endocrine: No thryomegaly Cardiac: Normal S1, S2; RRR; no murmurs, rubs, or gallops Lungs: Clear to auscultation bilaterally, no wheezing, rhonchi or rales  Abd: Soft, nontender, no hepatomegaly  Ext: No edema, pulses 2+, 2+ femoral pulse in the right groin, extensive hematoma, no bruit Musculoskeletal: No deformities, BUE and BLE strength normal and equal Skin: Warm and dry, no rashes   Neuro: Alert and oriented to person, place, time, and situation, CNII-XII grossly intact, no focal deficits  Psych: Normal mood and affect   Labs  High Sensitivity Troponin:   Recent Labs  Lab 11/22/21 1552 11/22/21 1752 11/24/21 0830  TROPONINIHS 208* 420* >24,000*     Cardiac EnzymesNo results for input(s): "TROPONINI" in the last 168 hours. No results for input(s): "TROPIPOC" in the last 168 hours.  Chemistry Recent Labs  Lab 11/22/21 2230 11/23/21 0049 11/24/21 0307  11/25/21 0017  NA  --  140 142 142  K  --  4.0 3.5 4.3  CL  --  102 103 109  CO2  --  $R'26 24 23  'PL$ GLUCOSE  --  174* 180* 177*  BUN  --  13 20 35*  CREATININE  --  1.03* 1.42* 1.70*  CALCIUM  --  9.4 10.0 8.8*  PROT 5.6*  --   --   --   ALBUMIN 3.2*  --   --   --   AST 43*  --   --   --   ALT 16  --   --   --   ALKPHOS 60  --   --   --   BILITOT 0.9  --   --   --   GFRNONAA  --  55* 37* 30*  ANIONGAP  --  $R'12 15 10    'pf$ Hematology Recent Labs  Lab 11/24/21 0307 11/24/21 2008 11/25/21 0017  WBC 22.9* 16.2* 15.8*  RBC 5.28* 4.52 4.11  HGB 15.0 12.6 11.4*  HCT 44.6 38.9 36.1  MCV 84.5 86.1 87.8  MCH 28.4 27.9 27.7  MCHC 33.6 32.4 31.6  RDW 14.9 15.4 15.5  PLT 481* 368 323   BNPNo results for input(s): "BNP", "PROBNP" in the last 168 hours.  DDimer No results for input(s): "DDIMER" in the last 168 hours.   Radiology  CARDIAC CATHETERIZATION  Result Date: 11/24/2021 Images from the original result were not included.   Dist LM to Prox LAD lesion is 100% stenosed.   Ost LM to Dist LM lesion is 25% stenosed.   Dist Cx lesion is 50% stenosed.   Origin to Prox Graft lesion is 99% stenosed.   Previously placed Ost Cx to Prox Cx stent of unknown type is  widely patent.   A drug-eluting stent was successfully placed using a STENT ONYX FRONTIER 3.0X18.   Post intervention, there is a 0% residual stenosis. Debbie [name] is a 81 y.o. female  [id number] LOCATION:  FACILITY: [facility] PHYSICIAN: Quay Burow, M.D. 1940/06/28 DATE OF PROCEDURE:  11/24/2021 DATE OF DISCHARGE: CARDIAC CATHETERIZATION / PCI DES Diag SVG History obtained from chart review.Debbie [name] is a 81 y.o. female with CAD status post CABG, hypertension, hyperlipidemia who was admitted on 11/22/2021 for non-STEMI.  Underwent left heart catheterization with PCI in the left main into left circumflex.  Course complicated on 12/21/6627 with anterior ST  elevation, severe LV dysfunction with an LAD wall motion abnormality.  Because of  this Dr. Flora Lipps  felt that she should have relook coronary angiography.  The patient was however asymptomatic. PROCEDURE DESCRIPTION: The patient was brought to the second floor [facility] cath lab in the postabsorptive state.  She was premedicated with IV Solu-Medrol and Benadryl for contrast allergy prophylaxis.  Her right groin was prepped and shaved in usual sterile fashion. Xylocaine 1% was used for local anesthesia. A 5 French sheath was inserted into the right common femoral artery using standard Seldinger technique.  Ultrasound was used to identify the right common femoral artery and guide access.  A digital image was captured and placed the patient's chart.  5 French right left second Silastic catheters were used for selective coronary angiography, selective vein graft and IMA graft angiography and obtain left heart pressures.  Isovue dye was used for the entirety of the case (95 cc of Contrast total to patient).  Retrograde aortic, ventricular and pullback pressures were recorded.  The LVEDP was measured at 31 mmHg. Her left main into proximal circumflex stent was widely patent.  The LAD was occluded at the origin which was an old finding.  Her LIMA was small in caliber and was somewhat atretic.  The distal LAD was small after the anastomosis.  The nondominant right was not visualized.  The sequential vein to an obtuse marginal branch and PDA was widely patent.  The vein to the diagonal branch was subtotally occluded with a 99% stenosis and TIMI I flow.  This did fill a diagonal branch and backfill the LAD.  I assume that this was the "culprit vessel", and plan is to perform PCI drug-eluting stenting. The patient was already on aspirin and Brilinta.  She received 7000's of heparin with an ACT of 420.  Isovue dye is used for the entirety of the case.  Retroaortic pressures monitored to the case.  I crossed the proximal diagonal vein graft lesion with some difficulty.  I predilated with a 2 mm  balloon.  There did appear to be a filling defect beyond the stenosis suggestive of thrombus.  I attempted to place a 4 mm spider distal protection device but this did not pass the proximal lesion and therefore I decided simply to stent and cover the angiographic thrombus.  I placed a 3 mm x 18 mm long Medtronic Onyx frontier drug-eluting stent from the ostium of the vein graft beyond the lesion in the incorporating the filling defect deployed at 14 atm (3.08 mm) resulting in reduction of a 99% proximal diagonal branch SV's G stenosis with TIMI I flow to 0% residual TIMI-3 flow.  There is no residual filling defect nor did there appear to be distal thromboembolic disease.  The guidewire and catheter were removed.  The sheath was was secured in place.  The patient did receive a bolus of Aggrastat and a brief infusion which was discontinued.   Widely patent left main into the circumflex stent performed yesterday by Dr. Excell Seltzer.  Atretic appearing LIMA to a small distal LAD.  Patent sequential vein to an OM and PDA with high-grade proximal SVG to diagonal vein graft stenosis probably representing the "culprit lesion with an excellent angiographic result after PCI drug-eluting stenting.  Hopefully her anterior wall stunned and will come back.  The sheath will be removed once ACT falls below 170 pressure held.  I am going to restart heparin 4 hours after sheath removal for 24 hours.  Given her elevated LVEDP, she probably should be quire some diuresis as well.  She left the lab in stable condition. Quay Burow. MD, [facility] 11/24/2021 12:51 PM    ECHOCARDIOGRAM LIMITED  Result Date: 11/24/2021    ECHOCARDIOGRAM LIMITED REPORT   Patient Name:   LINCOLN KLEINER Date of Exam: 11/24/2021 Medical Rec #:  [id number]           Height:       62.5 in Accession #:    [id number]          Weight:       128.3 lb Date of Birth:  05-14-1941           BSA:          1.592 m Patient Age:    13 years            BP:           115/91 mmHg  Patient Gender: F                   HR:           73 bpm. Exam Location:  Inpatient Procedure: Limited Echo, Cardiac Doppler, Color Doppler and Intracardiac            Opacification Agent Indications:    NSTEMI I21.4  History:        Patient has prior history of Echocardiogram examinations, most                 recent 10/19/2021. CAD and NSTEMI, Prior CABG, COPD,                 Signs/Symptoms:Chest Pain; Risk Factors:Dyslipidemia and Former                 Smoker. Successful PCI of the native left main and native                 circumflex 11/23/21.  Sonographer:    Darlina Sicilian RDCS Referring Phys: Eleonore Chiquito, Marcello Moores  Sonographer Comments: Severe native vessel coronary artery disease with severe ostial left main stenosis, total occlusion of the LAD, severe stenosis in the distal left main/circumflex junction, severe diffuse stenosis of the distal AV circumflex, and a diffusely diseased nondominant RCA. IMPRESSIONS  1. Suspect small 0.5 x 1 mm mobile thrombus noted at the inferior apex (image 38) by Definity contrast - the enterior anterior wall and apex are akinetic. Left ventricular ejection fraction, by estimation, is 25 to 30%. The left ventricle has severely decreased function. The left ventricle demonstrates regional wall motion abnormalities (see scoring diagram/findings for description). There is severe akinesis of the left ventricular, entire anterior wall, anteroseptal wall, apical segment and inferoapical segment.  2. Right ventricular systolic function is low normal. The right ventricular size is normal. There is moderately elevated pulmonary artery systolic pressure.  3. The mitral valve is abnormal. Mild to moderate mitral valve regurgitation.  4. The aortic valve is tricuspid. Aortic valve regurgitation is not visualized. Comparison(s): Changes from prior study are noted. 10/19/2021: LVEF 60-65%. Conclusion(s)/Recommendation(s): Critical findings reported to Dr. Audie Box and acknowledged at 11:59 am.  FINDINGS  Left Ventricle: Suspect small 0.5 x 1 mm mobile thrombus noted at the inferior apex (image 38) by Definity contrast - the enterior anterior wall and apex are akinetic. Left ventricular ejection fraction, by estimation, is 25 to 30%. The left ventricle has severely decreased function. The left ventricle demonstrates regional wall motion abnormalities. Severe akinesis of  the left ventricular, entire anterior wall, anteroseptal wall, apical segment and inferoapical segment. Definity contrast agent was given IV to delineate the left ventricular endocardial borders. Right Ventricle: The right ventricular size is normal. No increase in right ventricular wall thickness. Right ventricular systolic function is low normal. There is moderately elevated pulmonary artery systolic pressure. The tricuspid regurgitant velocity  is 3.06 m/s, and with an assumed right atrial pressure of 8 mmHg, the estimated right ventricular systolic pressure is 20.9 mmHg. Mitral Valve: The mitral valve is abnormal. There is mild calcification of the anterior and posterior mitral valve leaflet(s). Mild to moderate mitral valve regurgitation. Tricuspid Valve: The tricuspid valve is grossly normal. Tricuspid valve regurgitation is mild. Aortic Valve: The aortic valve is tricuspid. Aortic valve regurgitation is not visualized. Pulmonic Valve: The pulmonic valve was grossly normal. Pulmonic valve regurgitation is trivial.  LV Volumes (MOD) LV vol d, MOD A2C: 89.5 ml LV vol d, MOD A4C: 101.0 ml LV vol s, MOD A2C: 55.4 ml LV vol s, MOD A4C: 68.3 ml LV SV MOD A2C:     34.1 ml LV SV MOD A4C:     101.0 ml LV SV MOD BP:      33.9 ml AORTIC VALVE             PULMONIC VALVE LVOT Vmax:   53.20 cm/s  RVOT Peak grad: 1 mmHg LVOT Vmean:  33.200 cm/s LVOT VTI:    0.081 m TRICUSPID VALVE TR Peak grad:   37.5 mmHg TR Vmax:        306.00 cm/s  SHUNTS Systemic VTI: 0.08 m Pulmonic VTI: 0.053 m Lyman Bishop MD Electronically signed by Lyman Bishop MD  Signature Date/Time: 11/24/2021/12:01:54 PM    Final    CARDIAC CATHETERIZATION  Result Date: 11/23/2021 1.  Severe native vessel coronary artery disease with severe ostial left main stenosis, total occlusion of the LAD, severe stenosis in the distal left main/circumflex junction, severe diffuse stenosis of the distal AV circumflex, and a diffusely diseased nondominant RCA 2.  Status post aortocoronary bypass surgery with continued patency of the LIMA to LAD and occlusion of the saphenous vein grafts 3.  Successful PCI of the native left main and native circumflex using a 4.0 x 24 mm Synergy DES Recommendation: Dual antiplatelet therapy with aspirin and ticagrelor at least 12 months, aggressive risk reduction measures, suspect the patient occluded a vein graft causing her acute coronary syndrome.  There were no vein graft targets for PCI.  Her native left main into the circumflex is treated as this large territory is now potentially ischemic after occlusion of the sequential saphenous vein graft to OM and PLA.    Cardiac Studies  LHC 11/24/2021   Dist LM to Prox LAD lesion is 100% stenosed.   Ost LM to Dist LM lesion is 25% stenosed.   Dist Cx lesion is 50% stenosed.   Origin to Prox Graft lesion is 99% stenosed.   Previously placed Ost Cx to Prox Cx stent of unknown type is  widely patent.   A drug-eluting stent was successfully placed using a STENT ONYX FRONTIER 3.0X18.   Post intervention, there is a 0% residual stenosis.  LHC 11/24/2021  1. Suspect small 0.5 x 1 mm mobile thrombus noted at the inferior apex  (image 38) by Definity contrast - the enterior anterior wall and apex are  akinetic. Left ventricular ejection fraction, by estimation, is 25 to 30%.  The left ventricle has severely  decreased function. The  left ventricle demonstrates regional wall motion  abnormalities (see scoring diagram/findings for description). There is  severe akinesis of the left ventricular, entire anterior wall,   anteroseptal wall, apical segment and  inferoapical segment.   2. Right ventricular systolic function is low normal. The right  ventricular size is normal. There is moderately elevated pulmonary artery  systolic pressure.   3. The mitral valve is abnormal. Mild to moderate mitral valve  regurgitation.   4. The aortic valve is tricuspid. Aortic valve regurgitation is not  visualized.   Patient Profile  ZOI DEVINE is a 81 y.o. female with CAD status post CABG, hypertension, hyperlipidemia who was admitted on 11/22/2021 for non-STEMI.  Underwent left heart catheterization with PCI in the left main into left circumflex.  Course complicated on 12/21/6657 with anterior ST elevation in no wall motion abnormality.  Assessment & Plan   #CAD status post CABG #Initial non-STEMI, developed STEMI during hospitalization -Initially admitted for non-STEMI.  Underwent PCI to the native left main into left circumflex.  Developed ST elevation on 11/24/2021.  Taken back to the Cath Lab and found to have subtotal vein graft to diagonal branch.  She also has an ischemic cardiomyopathy.  Underwent PCI to the vein graft to diagonal branch.  Her ST elevation had resolved.  Still with some slight persistent ST elevation and T wave inversions in V2 through V5 but these are improved. -Continue aspirin and ticagrelor. -Pressures are soft.  Has suffered an AKI.  We will hold her beta-blocker given bradycardia overnight.  Hold further blood pressure medications until blood pressure is improved.  She was diuresed overnight.  Does not appear to be in significant congestive heart failure. -On Lipitor/Zetia.  #Right groin hematoma -2+ right femoral pulse.  Extensive bruising.  No bruit.  We will obtain a vascular ultrasound today.  #Ischemic cardiomyopathy EF 25-30% -Now with significant reduction in LV function due to ST elevation myocardial infarction -LVEDP yesterday 32 mmHg.  She was given Lasix as a one-time dose.   Now has AKI and hypotension. -Hold beta-blocker in the setting of low EF and low blood pressure. -We will add medications as we are able.  I do fear that she has an advanced cardiomyopathy and given the appearance of her echocardiogram she may not recover her LV function. -Hold ACE/ARB/ARNI/MRA given AKI. -Can add Imdur or hydralazine as we are able.  For now would like for her to recover from everything and then we can add back medications as we are able.  #LV thrombus -Echocardiogram with concern for apical LV thrombus.  Reviewed images.  This is questionable.  -Now with extensive bruising in her right groin.  Vascular ultrasound is pending.   -Discussed her case with interventional cardiology, Dr. Quay Burow.  Given recent going bleed he is recommended to hold heparin for 48 hours.  We will then obtain a limited echocardiogram to see if we get a better look to determine if there is truly an LV thrombus.  May consider cardiac MRI as well before we commit this elderly lady to long-term anticoagulation.  Regardless I believe her bleeding risk outweighs the risk of starting AC.    #Sinus bradycardia -Hold beta-blocker for now.  Add back as we are able.  #AKI -Secondary to cardiomyopathy as well as contrast load. -Still making urine.  Close monitoring of urine output.  Strict I's and O's.  Avoid nephrotoxic agents. -Baseline creatinine 0.9.  Did have to go urgently to the Cath Lab yesterday  for repeat angiography due to anterior STEMI.  Unfortunately had no choice.  #SVT -brief. No issues. Not Afib  #HLD -home statin/zetia  #Leukocytosis -Secondary to myocardial infarction.  Check procalcitonin today.  #Hypothyroidism -Home statin.  TSH is trending down.  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  She is having memory issues.  She is getting lost while driving.  Suspect there is underlying dementia.  Has been intermittently confused here.   Stable.  FEN -No intravenous fluids -Code: Full -Diet: Heart healthy -DVT PPx: SCDs -Disposition Will remain in the ICU given above issues  CRITICAL CARE Performed by: Lake Bells T O'Neal  Total critical care time: 45 minutes. Critical care time was exclusive of separately billable procedures and treating other patients. Critical care was necessary to treat or prevent imminent or life-threatening deterioration. Critical care was time spent personally by me on the following activities: development of treatment plan with patient and/or surrogate as well as nursing, discussions with consultants, evaluation of patient's response to treatment, examination of patient, obtaining history from patient or surrogate, ordering and performing treatments and interventions, ordering and review of laboratory studies, ordering and review of radiographic studies, pulse oximetry and re-evaluation of patient's condition.  For questions or updates, please contact [place] Please consult [email or web address] for contact info under   Signed, Lake Bells T. Audie Box, MD, [place]  11/25/2021 7:06 AM

## 2021-11-25 NOTE — Progress Notes (Signed)
VASCULAR LAB    Right groin ultrasound to rule out pseudoaneurysm has been performed.  See CV proc for preliminary results.   Muad Noga, RVT 11/25/2021, 11:44 AM

## 2021-11-25 NOTE — Plan of Care (Signed)

## 2021-11-25 NOTE — Progress Notes (Signed)
ANTICOAGULATION CONSULT NOTE - Follow Up Consult  Pharmacy Consult for IV heparin Indication: chest pain/ACS  Allergies  Allergen Reactions   Ivp Dye [Iodinated Contrast Media] Swelling    Kidney Dye   Betadine [Povidone Iodine] Rash   Codeine Nausea And Vomiting   Other Nausea And Vomiting    Narcotics - nausea,vomiting   Penicillins Rash    Did it involve swelling of the face/tongue/throat, SOB, or low BP? No Did it involve sudden or severe rash/hives, skin peeling, or any reaction on the inside of your mouth or nose? No Did you need to seek medical attention at a hospital or doctor's office? No When did it last happen?       If all above answers are "NO", may proceed with cephalosporin use.    Sulfa Antibiotics Rash    Patient Measurements: Height: 5\' 2"  (157.5 cm) Weight: 59.1 kg (130 lb 4.7 oz) IBW/kg (Calculated) : 50.1 Heparin Dosing Weight: 58.6  Vital Signs: Temp: 97.6 F (36.4 C) (07/08 2300) Temp Source: Oral (07/08 2300) BP: 97/59 (07/09 0715) Pulse Rate: 58 (07/09 0715)  Labs: Recent Labs    11/22/21 1552 11/22/21 1752 11/23/21 0049 11/23/21 0815 11/24/21 0307 11/24/21 0830 11/24/21 2008 11/25/21 0017  HGB 13.3  --  12.9  --  15.0  --  12.6 11.4*  HCT 41.0  --  39.4  --  44.6  --  38.9 36.1  PLT 447*  --  421*  --  481*  --  368 323  HEPARINUNFRC  --   --  0.52 0.22*  --   --   --   --   CREATININE 0.94  --  1.03*  --  1.42*  --   --  1.70*  TROPONINIHS 208* 420*  --   --   --  >24,000*  --   --      Estimated Creatinine Clearance: 20.9 mL/min (A) (by C-G formula based on SCr of 1.7 mg/dL (H)).   Medications:  Infusions:   sodium chloride     sodium chloride     sodium chloride      Assessment: 80 YOF with NSTEMI. Not on anticoagulant PTA. Hgb and PLTs wnl and stable. EKG from 07/07 1400 showed NSTEMI, 07/08 0737. Showing ST elevations in multiple leads (V2, V3, V5, V6). Patient is asymptomatic. Pt went to cath on 7/7 where left main  stent was placed and bASA/Brillinta was started. She returned to cath 7/8 due to EKG changes and a stent was placed.  Heparin was to start 4 hours after sheath pull and to continue for 24 hours but plan was aborted after large hematoma was formed. Patient now found to have LV thrombus on echo, will start heparin this morning without bolus. Hemoglobin down slightly overnight from 12.6 to 11.4, platelet count within normal limit.    Goal of Therapy:  Heparin level 0.3-0.7 units/ml Monitor platelets by anticoagulation protocol: Yes   Plan:  -Start heparin at 900 units/hr  -Heparin level in 8 hours and daily wth CBC daily  9/7 PharmD., BCPS Clinical Pharmacist 11/25/2021 7:30 AM

## 2021-11-26 ENCOUNTER — Telehealth (HOSPITAL_COMMUNITY): Payer: Self-pay | Admitting: Pharmacy Technician

## 2021-11-26 ENCOUNTER — Other Ambulatory Visit (HOSPITAL_COMMUNITY): Payer: Self-pay

## 2021-11-26 ENCOUNTER — Encounter (HOSPITAL_COMMUNITY): Payer: Self-pay | Admitting: Cardiovascular Disease

## 2021-11-26 DIAGNOSIS — I214 Non-ST elevation (NSTEMI) myocardial infarction: Secondary | ICD-10-CM | POA: Diagnosis not present

## 2021-11-26 DIAGNOSIS — I249 Acute ischemic heart disease, unspecified: Secondary | ICD-10-CM | POA: Diagnosis not present

## 2021-11-26 DIAGNOSIS — R079 Chest pain, unspecified: Secondary | ICD-10-CM

## 2021-11-26 DIAGNOSIS — J438 Other emphysema: Secondary | ICD-10-CM | POA: Diagnosis not present

## 2021-11-26 LAB — POCT ACTIVATED CLOTTING TIME
Activated Clotting Time: 389 seconds
Activated Clotting Time: 420 seconds

## 2021-11-26 LAB — CBC
HCT: 30.8 % — ABNORMAL LOW (ref 36.0–46.0)
Hemoglobin: 9.4 g/dL — ABNORMAL LOW (ref 12.0–15.0)
MCH: 27.6 pg (ref 26.0–34.0)
MCHC: 30.5 g/dL (ref 30.0–36.0)
MCV: 90.6 fL (ref 80.0–100.0)
Platelets: 247 10*3/uL (ref 150–400)
RBC: 3.4 MIL/uL — ABNORMAL LOW (ref 3.87–5.11)
RDW: 15.6 % — ABNORMAL HIGH (ref 11.5–15.5)
WBC: 11.2 10*3/uL — ABNORMAL HIGH (ref 4.0–10.5)
nRBC: 0 % (ref 0.0–0.2)

## 2021-11-26 LAB — BASIC METABOLIC PANEL
Anion gap: 11 (ref 5–15)
BUN: 42 mg/dL — ABNORMAL HIGH (ref 8–23)
CO2: 23 mmol/L (ref 22–32)
Calcium: 8.6 mg/dL — ABNORMAL LOW (ref 8.9–10.3)
Chloride: 105 mmol/L (ref 98–111)
Creatinine, Ser: 1.34 mg/dL — ABNORMAL HIGH (ref 0.44–1.00)
GFR, Estimated: 40 mL/min — ABNORMAL LOW (ref >60)
Glucose, Bld: 118 mg/dL — ABNORMAL HIGH (ref 70–99)
Potassium: 3.7 mmol/L (ref 3.5–5.1)
Sodium: 139 mmol/L (ref 135–145)

## 2021-11-26 LAB — PROTIME-INR
INR: 1.1 (ref 0.8–1.2)
Prothrombin Time: 13.6 seconds (ref 11.4–15.2)

## 2021-11-26 LAB — HEMOGLOBIN AND HEMATOCRIT, BLOOD
HCT: 30.6 % — ABNORMAL LOW (ref 36.0–46.0)
Hemoglobin: 9.5 g/dL — ABNORMAL LOW (ref 12.0–15.0)

## 2021-11-26 LAB — HEPARIN LEVEL (UNFRACTIONATED): Heparin Unfractionated: 0.13 IU/mL — ABNORMAL LOW (ref 0.30–0.70)

## 2021-11-26 MED ORDER — SODIUM CHLORIDE 0.9 % IV SOLN
INTRAVENOUS | Status: AC
Start: 2021-11-26 — End: 2021-11-26

## 2021-11-26 MED ORDER — POTASSIUM CHLORIDE CRYS ER 20 MEQ PO TBCR
20.0000 meq | EXTENDED_RELEASE_TABLET | Freq: Once | ORAL | Status: AC
Start: 2021-11-26 — End: 2021-11-26
  Administered 2021-11-26: 20 meq via ORAL
  Filled 2021-11-26: qty 1

## 2021-11-26 MED ORDER — HEPARIN (PORCINE) 25000 UT/250ML-% IV SOLN
1000.0000 [IU]/h | INTRAVENOUS | Status: DC
  Administered 2021-11-26: 800 [IU]/h via INTRAVENOUS
  Filled 2021-11-26: qty 250

## 2021-11-26 MED FILL — Verapamil HCl IV Soln 2.5 MG/ML: INTRAVENOUS | Qty: 2 | Status: AC

## 2021-11-26 MED FILL — Nitroglycerin IV Soln 100 MCG/ML in D5W: INTRA_ARTERIAL | Qty: 10 | Status: AC

## 2021-11-26 NOTE — TOC Benefit Eligibility Note (Signed)
Patient Advocate Encounter  Insurance verification completed.    The patient is currently admitted and upon discharge could be taking Eliquis 5 mg.  The current 30 day co-pay is, $45.00.   The patient is insured through [employer] Part D     Charmelle Soh, CPhT Pharmacy Patient Advocate Specialist [facility] Patient Advocate Team Direct Number: [phone]  Fax: [phone]        

## 2021-11-26 NOTE — Telephone Encounter (Signed)
Pharmacy Patient Advocate Encounter  Insurance verification completed.    The patient is insured through [employer] Part D   The patient is currently admitted and ran test claims for the following: Eliquis 5 mg.  Copays and coinsurance results were relayed to Inpatient clinical team.      

## 2021-11-26 NOTE — Progress Notes (Signed)
Progress Note  Patient Name: Debbie [name] Date of Encounter: 11/26/2021  [facility] Cardiologist: Rozann Lesches, MD   Subjective   Lying flat without dyspnea.  No chest pain.  No groin pain.  No ambulation yet.  Inpatient Medications    Scheduled Meds:  aspirin  81 mg Oral Daily   atorvastatin  80 mg Oral QHS   Chlorhexidine Gluconate Cloth  6 each Topical Daily   cholecalciferol  1,000 Units Oral Daily   ezetimibe  10 mg Oral Daily   levothyroxine  88 mcg Oral Daily   montelukast  10 mg Oral QHS   pantoprazole  40 mg Oral Daily   sodium chloride flush  3 mL Intravenous Q12H   sodium chloride flush  3 mL Intravenous Q12H   sodium chloride flush  3 mL Intravenous Q12H   ticagrelor  90 mg Oral BID   Continuous Infusions:  sodium chloride     sodium chloride     sodium chloride     PRN Meds: sodium chloride, sodium chloride, sodium chloride, acetaminophen, albuterol, ALPRAZolam, nitroGLYCERIN, ondansetron (ZOFRAN) IV, mouth rinse, sodium chloride flush, sodium chloride flush, sodium chloride flush, zolpidem   Vital Signs    Vitals:   11/26/21 0330 11/26/21 0400 11/26/21 0430 11/26/21 0500  BP: (!) 91/49 (!) 101/49 (!) 89/48 (!) 101/51  Pulse: (!) 50 (!) 58 (!) 50 (!) 52  Resp: 20 (!) $Remo'21 20 20  'jUOMB$ Temp:      TempSrc:      SpO2: 98% 99% 99% 97%  Weight:      Height:        Intake/Output Summary (Last 24 hours) at 11/26/2021 0829 Last data filed at 11/26/2021 0200 Gross per 24 hour  Intake 720 ml  Output 795 ml  Net -75 ml      11/25/2021    5:00 AM 11/24/2021    9:00 AM 11/24/2021    2:40 AM  Last 3 Weights  Weight (lbs) 130 lb 4.7 oz 130 lb 4.7 oz 128 lb 4.9 oz  Weight (kg) 59.1 kg 59.1 kg 58.2 kg      Telemetry    Sinus rhythm- Personally Reviewed  ECG    Performed 11/25/2021 demonstrates anterior precordial deep symmetrical T wave inversion and bradycardia.- Personally Reviewed  Physical Exam  Frail in appearance GEN: No acute distress.    Neck: No JVD Vasculature: Right femoral area ecchymosis.  No hematoma. Cardiac: RRR, no murmurs, rubs, or gallops.  Respiratory: Clear to auscultation bilaterally. GI: Soft, nontender, non-distended  MS: No edema; No deformity. Neuro:  Nonfocal  Psych: Normal affect   Labs    High Sensitivity Troponin:   Recent Labs  Lab 11/22/21 1552 11/22/21 1752 11/24/21 0830  TROPONINIHS 208* 420* >24,000*     Chemistry Recent Labs  Lab 11/22/21 2230 11/23/21 0049 11/24/21 0307 11/25/21 0017 11/26/21 0006  NA  --    < > 142 142 139  K  --    < > 3.5 4.3 3.7  CL  --    < > 103 109 105  CO2  --    < > $R'24 23 23  'lB$ GLUCOSE  --    < > 180* 177* 118*  BUN  --    < > 20 35* 42*  CREATININE  --    < > 1.42* 1.70* 1.34*  CALCIUM  --    < > 10.0 8.8* 8.6*  PROT 5.6*  --   --   --   --  ALBUMIN 3.2*  --   --   --   --   AST 43*  --   --   --   --   ALT 16  --   --   --   --   ALKPHOS 60  --   --   --   --   BILITOT 0.9  --   --   --   --   GFRNONAA  --    < > 37* 30* 40*  ANIONGAP  --    < > $R'15 10 11   'kE$ < > = values in this interval not displayed.    Lipids  Recent Labs  Lab 11/23/21 0049  CHOL 209*  TRIG 79  HDL 55  LDLCALC 138*  CHOLHDL 3.8    Hematology Recent Labs  Lab 11/25/21 0017 11/25/21 0731 11/26/21 0006 11/26/21 0547  WBC 15.8* 12.5* 11.2*  --   RBC 4.11 3.73* 3.40*  --   HGB 11.4* 10.4* 9.4* 9.5*  HCT 36.1 33.3* 30.8* 30.6*  MCV 87.8 89.3 90.6  --   MCH 27.7 27.9 27.6  --   MCHC 31.6 31.2 30.5  --   RDW 15.5 15.6* 15.6*  --   PLT 323 293 247  --    Thyroid  Recent Labs  Lab 11/23/21 0049  TSH 7.409*  FREET4 1.08    BNPNo results for input(s): "BNP", "PROBNP" in the last 168 hours.  DDimer No results for input(s): "DDIMER" in the last 168 hours.   Radiology    VAS [place] GROIN PSEUDOANEURYSM  Result Date: 11/25/2021  ARTERIAL PSEUDOANEURYSM  Patient Name:  Debbie [name]  Date of Exam:   11/25/2021 Medical Rec #: [id number]            Accession #:     [id number] Date of Birth: June 21, 1940            Patient Gender: F Patient Age:   48 years Exam Location:  [facility] Procedure:      VAS [place] GROIN PSEUDOANEURYSM Referring Phys: [place] Bells O'NEAL --------------------------------------------------------------------------------  Exam: Right groin Indications: Patient complains of groin pain and bruising. History: S/p catheterization 11/23/21 and 11/24/21. Comparison Study: No prior study on file Performing Technologist: Sharion Dove RVS  Examination Guidelines: A complete evaluation includes B-mode imaging, spectral Doppler, color Doppler, and power Doppler as needed of all accessible portions of each vessel. Bilateral testing is considered an integral part of a complete examination. Limited examinations for reoccurring indications may be performed as noted.  Summary: No evidence of pseudoaneurysm, AVF or DVT    --------------------------------------------------------------------------------    Preliminary    CARDIAC CATHETERIZATION  Result Date: 11/24/2021 Images from the original result were not included.   Dist LM to Prox LAD lesion is 100% stenosed.   Ost LM to Dist LM lesion is 25% stenosed.   Dist Cx lesion is 50% stenosed.   Origin to Prox Graft lesion is 99% stenosed.   Previously placed Ost Cx to Prox Cx stent of unknown type is  widely patent.   A drug-eluting stent was successfully placed using a STENT ONYX FRONTIER 3.0X18.   Post intervention, there is a 0% residual stenosis. Debbie [name] is a 81 y.o. female  [id number] LOCATION:  FACILITY: [place] PHYSICIAN: Quay Burow, M.D. Jun 14, 1940 DATE OF PROCEDURE:  11/24/2021 DATE OF DISCHARGE: CARDIAC CATHETERIZATION / PCI DES Diag SVG History obtained from chart review.Debbie [name] is a 81 y.o. female with CAD status post  CABG, hypertension, hyperlipidemia who was admitted on 11/22/2021 for non-STEMI.  Underwent left heart catheterization with PCI in the left main into left circumflex.  Course  complicated on 1/0/9604 with anterior ST elevation, severe LV dysfunction with an LAD wall motion abnormality.  Because of this Dr. Audie Box  felt that she should have relook coronary angiography.  The patient was however asymptomatic. PROCEDURE DESCRIPTION: The patient was brought to the second floor [facility] cath lab in the postabsorptive state.  She was premedicated with IV Solu-Medrol and Benadryl for contrast allergy prophylaxis.  Her right groin was prepped and shaved in usual sterile fashion. Xylocaine 1% was used for local anesthesia. A 5 French sheath was inserted into the right common femoral artery using standard Seldinger technique.  Ultrasound was used to identify the right common femoral artery and guide access.  A digital image was captured and placed the patient's chart.  5 French right left second Silastic catheters were used for selective coronary angiography, selective vein graft and IMA graft angiography and obtain left heart pressures.  Isovue dye was used for the entirety of the case (95 cc of Contrast total to patient).  Retrograde aortic, ventricular and pullback pressures were recorded.  The LVEDP was measured at 31 mmHg. Her left main into proximal circumflex stent was widely patent.  The LAD was occluded at the origin which was an old finding.  Her LIMA was small in caliber and was somewhat atretic.  The distal LAD was small after the anastomosis.  The nondominant right was not visualized.  The sequential vein to an obtuse marginal branch and PDA was widely patent.  The vein to the diagonal branch was subtotally occluded with a 99% stenosis and TIMI I flow.  This did fill a diagonal branch and backfill the LAD.  I assume that this was the "culprit vessel", and plan is to perform PCI drug-eluting stenting. The patient was already on aspirin and Brilinta.  She received 7000's of heparin with an ACT of 420.  Isovue dye is used for the entirety of the case.  Retroaortic pressures  monitored to the case.  I crossed the proximal diagonal vein graft lesion with some difficulty.  I predilated with a 2 mm balloon.  There did appear to be a filling defect beyond the stenosis suggestive of thrombus.  I attempted to place a 4 mm spider distal protection device but this did not pass the proximal lesion and therefore I decided simply to stent and cover the angiographic thrombus.  I placed a 3 mm x 18 mm long Medtronic Onyx frontier drug-eluting stent from the ostium of the vein graft beyond the lesion in the incorporating the filling defect deployed at 14 atm (3.08 mm) resulting in reduction of a 99% proximal diagonal branch SV's G stenosis with TIMI I flow to 0% residual TIMI-3 flow.  There is no residual filling defect nor did there appear to be distal thromboembolic disease.  The guidewire and catheter were removed.  The sheath was was secured in place.  The patient did receive a bolus of Aggrastat and a brief infusion which was discontinued.   Widely patent left main into the circumflex stent performed yesterday by Dr. Burt Knack.  Atretic appearing LIMA to a small distal LAD.  Patent sequential vein to an OM and PDA with high-grade proximal SVG to diagonal vein graft stenosis probably representing the "culprit lesion with an excellent angiographic result after PCI drug-eluting stenting.  Hopefully her anterior wall stunned and  will come back.  The sheath will be removed once ACT falls below 170 pressure held.  I am going to restart heparin 4 hours after sheath removal for 24 hours.  Given her elevated LVEDP, she probably should be quire some diuresis as well.  She left the lab in stable condition. Quay Burow. MD, [facility] 11/24/2021 12:51 PM    ECHOCARDIOGRAM LIMITED  Result Date: 11/24/2021    ECHOCARDIOGRAM LIMITED REPORT   Patient Name:   Debbie [name] Date of Exam: 11/24/2021 Medical Rec #:  [id number]           Height:       62.5 in Accession #:    [id number]          Weight:       128.3 lb  Date of Birth:  1940/09/10           BSA:          1.592 m Patient Age:    81 years            BP:           115/91 mmHg Patient Gender: F                   HR:           73 bpm. Exam Location:  Inpatient Procedure: Limited Echo, Cardiac Doppler, Color Doppler and Intracardiac            Opacification Agent Indications:    NSTEMI I21.4  History:        Patient has prior history of Echocardiogram examinations, most                 recent 10/19/2021. CAD and NSTEMI, Prior CABG, COPD,                 Signs/Symptoms:Chest Pain; Risk Factors:Dyslipidemia and Former                 Smoker. Successful PCI of the native left main and native                 circumflex 11/23/21.  Sonographer:    Darlina Sicilian RDCS Referring Phys: Eleonore Chiquito, Marcello Moores  Sonographer Comments: Severe native vessel coronary artery disease with severe ostial left main stenosis, total occlusion of the LAD, severe stenosis in the distal left main/circumflex junction, severe diffuse stenosis of the distal AV circumflex, and a diffusely diseased nondominant RCA. IMPRESSIONS  1. Suspect small 0.5 x 1 mm mobile thrombus noted at the inferior apex (image 38) by Definity contrast - the enterior anterior wall and apex are akinetic. Left ventricular ejection fraction, by estimation, is 25 to 30%. The left ventricle has severely decreased function. The left ventricle demonstrates regional wall motion abnormalities (see scoring diagram/findings for description). There is severe akinesis of the left ventricular, entire anterior wall, anteroseptal wall, apical segment and inferoapical segment.  2. Right ventricular systolic function is low normal. The right ventricular size is normal. There is moderately elevated pulmonary artery systolic pressure.  3. The mitral valve is abnormal. Mild to moderate mitral valve regurgitation.  4. The aortic valve is tricuspid. Aortic valve regurgitation is not visualized. Comparison(s): Changes from prior study are noted.  10/19/2021: LVEF 60-65%. Conclusion(s)/Recommendation(s): Critical findings reported to Dr. Audie Box and acknowledged at 11:59 am. FINDINGS  Left Ventricle: Suspect small 0.5 x 1 mm mobile thrombus noted at the inferior apex (image 38) by Definity contrast - the enterior anterior wall  and apex are akinetic. Left ventricular ejection fraction, by estimation, is 25 to 30%. The left ventricle has severely decreased function. The left ventricle demonstrates regional wall motion abnormalities. Severe akinesis of the left ventricular, entire anterior wall, anteroseptal wall, apical segment and inferoapical segment. Definity contrast agent was given IV to delineate the left ventricular endocardial borders. Right Ventricle: The right ventricular size is normal. No increase in right ventricular wall thickness. Right ventricular systolic function is low normal. There is moderately elevated pulmonary artery systolic pressure. The tricuspid regurgitant velocity  is 3.06 m/s, and with an assumed right atrial pressure of 8 mmHg, the estimated right ventricular systolic pressure is 04.8 mmHg. Mitral Valve: The mitral valve is abnormal. There is mild calcification of the anterior and posterior mitral valve leaflet(s). Mild to moderate mitral valve regurgitation. Tricuspid Valve: The tricuspid valve is grossly normal. Tricuspid valve regurgitation is mild. Aortic Valve: The aortic valve is tricuspid. Aortic valve regurgitation is not visualized. Pulmonic Valve: The pulmonic valve was grossly normal. Pulmonic valve regurgitation is trivial.  LV Volumes (MOD) LV vol d, MOD A2C: 89.5 ml LV vol d, MOD A4C: 101.0 ml LV vol s, MOD A2C: 55.4 ml LV vol s, MOD A4C: 68.3 ml LV SV MOD A2C:     34.1 ml LV SV MOD A4C:     101.0 ml LV SV MOD BP:      33.9 ml AORTIC VALVE             PULMONIC VALVE LVOT Vmax:   53.20 cm/s  RVOT Peak grad: 1 mmHg LVOT Vmean:  33.200 cm/s LVOT VTI:    0.081 m TRICUSPID VALVE TR Peak grad:   37.5 mmHg TR Vmax:         306.00 cm/s  SHUNTS Systemic VTI: 0.08 m Pulmonic VTI: 0.053 m Lyman Bishop MD Electronically signed by Lyman Bishop MD Signature Date/Time: 11/24/2021/12:01:54 PM    Final     Cardiac Studies   CARDIAC CATH PCI 11/24/2021: Diagnostic Dominance: Left  Intervention    Patient Profile     81 y.o. female  status post CABG, hypertension, hyperlipidemia who was admitted on 11/22/2021 for non-STEMI.  Underwent left heart catheterization with PCI in the left main into left circumflex.  Course complicated on 12/26/9167 with anterior ST elevation in no wall motion abnormality.  Assessment & Plan    CAD/CABG presenting with non-ST elevation MI: Persistent anterior T wave inversion.  No recurrent angina. Inferoapical left ventricular thrombus: Heparin this evening and transition to Eliquis tomorrow if no further bleeding. Right femoral hematoma: No hematoma and no evidence of pseudoaneurysm  AV F. 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  IV fluids will need to be given with caution. Acute kidney injury: Creatinine is improving with downward trend to 1.34 today. Impaired memory: Not assessed Low blood pressure: IV hydration will be attempted to see the if this improves her blood pressure.  For questions or updates, please contact [place] Please consult [email or web address] for contact info under        Signed, Sinclair Grooms, MD  11/26/2021, 8:29 AM

## 2021-11-26 NOTE — Progress Notes (Signed)
ANTICOAGULATION CONSULT NOTE - Initial Consult  Pharmacy Consult for IV heparin Indication: LV thrombus  Allergies  Allergen Reactions   Ivp Dye [Iodinated Contrast Media] Swelling    Kidney Dye   Betadine [Povidone Iodine] Rash   Codeine Nausea And Vomiting   Other Nausea And Vomiting    Narcotics - nausea,vomiting   Penicillins Rash    Did it involve swelling of the face/tongue/throat, SOB, or low BP? No Did it involve sudden or severe rash/hives, skin peeling, or any reaction on the inside of your mouth or nose? No Did you need to seek medical attention at a hospital or doctor's office? No When did it last happen?       If all above answers are "NO", may proceed with cephalosporin use.    Sulfa Antibiotics Rash    Patient Measurements: Height: 5\' 2"  (157.5 cm) Weight: 59.1 kg (130 lb 4.7 oz) IBW/kg (Calculated) : 50.1 Heparin Dosing Weight: 59.1 kg  Vital Signs: Temp: 98.7 F (37.1 C) (07/10 1200) Temp Source: Oral (07/10 1200) BP: 92/56 (07/10 1100) Pulse Rate: 53 (07/10 1100)  Labs: Recent Labs    11/24/21 0307 11/24/21 0830 11/24/21 2008 11/25/21 0017 11/25/21 0731 11/26/21 0006 11/26/21 0547  HGB 15.0  --    < > 11.4* 10.4* 9.4* 9.5*  HCT 44.6  --    < > 36.1 33.3* 30.8* 30.6*  PLT 481*  --    < > 323 293 247  --   LABPROT  --   --   --   --   --  13.6  --   INR  --   --   --   --   --  1.1  --   CREATININE 1.42*  --   --  1.70*  --  1.34*  --   TROPONINIHS  --  >24,000*  --   --   --   --   --    < > = values in this interval not displayed.    Estimated Creatinine Clearance: 26.5 mL/min (A) (by C-G formula based on SCr of 1.34 mg/dL (H)).   Medical History: Past Medical History:  Diagnosis Date   Allergic rhinitis    Anxiety    Arthritis    Asthma    Bladder spasms    CAD (coronary artery disease)    CABG 2006 [facility]   Carotid artery disease (HCC)    Collagen vascular disease (HCC)    COPD (chronic obstructive pulmonary disease)  (HCC)    Cystocele    Essential hypertension    GERD (gastroesophageal reflux disease)    Hyperlipidemia    Hypothyroidism    Osteoporosis    Renal insufficiency    Vaginal vault prolapse     Medications:  Infusions:   sodium chloride     sodium chloride     sodium chloride     sodium chloride 100 mL/hr at 11/26/21 1218   heparin      Assessment: 81 yo female s/p NSTEMI, found with LV thrombus.  Also with groin hematoma s/p cath, Hgb down 11.4 > 9.4.  Site stable today and pharmacy asked to start IV heparin for LV thrombus.  Goal of Therapy:  Heparin level 0.3-0.5 (target lower end given recent bleed) Monitor platelets by anticoagulation protocol: Yes   Plan:  Start IV heparin at 800 units/hr without bolus. Check heparin level in 8 hrs. Heparin level and CBC in AM. If hematoma site stable, planning to convert  to Eliquis in the AM.  Reece Leader, Loura Back, BCPS, [facility] Clinical Pharmacist  11/26/2021 1:47 PM   [facility] pharmacy phone numbers are listed on [email or web address]

## 2021-11-26 NOTE — Progress Notes (Signed)
CARDIAC REHAB PHASE I   PRE:  Rate/Rhythm: 57 SB  BP:  Sitting: 102/56      SaO2: 100 2L  MODE:  Ambulation: 170 ft   POST:  Rate/Rhythm: 81 SR  BP:  Sitting: 119/68    SaO2: 99 RA  Pt ambulated 119ft in hallway assist of one with EVA. Pt denies CP, SOB, or dizziness throughout. Pt returned to recliner. Pt educated on importance of ASA and Brilinta. Pt given MI book, stent card, and heart healthy diet. Reviewed site care and restrictions. Will continue to follow.  [phone] Debbie Bowen, RN BSN 11/26/2021 11:52 AM

## 2021-11-26 NOTE — Progress Notes (Signed)
It was noted by RN that the pt has a had a downward trend in her HGB in the last 24 hours from 11.4 to 9.4. Pt's bp has dropped to  87/48 (60). There has been no a change in the pt's vascular or neuro status. Pt right groin site is marked and monitor for bleeding and there has been no change at the site. Pt has no complaints of pain or discomfort at this time. Dr. Hyacinth Meeker  made aware of pt's current status and gave orders to recheck HGB at 5 am. Pt is currently resting comfortably with call bell in reach. Will continue to monitor .

## 2021-11-27 ENCOUNTER — Telehealth (HOSPITAL_COMMUNITY): Payer: Self-pay | Admitting: Pharmacy Technician

## 2021-11-27 ENCOUNTER — Other Ambulatory Visit (HOSPITAL_COMMUNITY): Payer: Self-pay

## 2021-11-27 ENCOUNTER — Inpatient Hospital Stay (HOSPITAL_COMMUNITY): Payer: Medicare HMO

## 2021-11-27 DIAGNOSIS — R079 Chest pain, unspecified: Secondary | ICD-10-CM | POA: Diagnosis not present

## 2021-11-27 DIAGNOSIS — I5023 Acute on chronic systolic (congestive) heart failure: Secondary | ICD-10-CM | POA: Diagnosis not present

## 2021-11-27 DIAGNOSIS — I214 Non-ST elevation (NSTEMI) myocardial infarction: Secondary | ICD-10-CM | POA: Diagnosis not present

## 2021-11-27 DIAGNOSIS — R531 Weakness: Secondary | ICD-10-CM | POA: Diagnosis not present

## 2021-11-27 DIAGNOSIS — I249 Acute ischemic heart disease, unspecified: Secondary | ICD-10-CM | POA: Diagnosis not present

## 2021-11-27 LAB — ECHOCARDIOGRAM LIMITED
Calc EF: 42.7 %
Height: 62 in
Single Plane A2C EF: 29 %
Single Plane A4C EF: 50.2 %
Weight: 2131.2 oz

## 2021-11-27 LAB — CBC
HCT: 28.6 % — ABNORMAL LOW (ref 36.0–46.0)
Hemoglobin: 9.1 g/dL — ABNORMAL LOW (ref 12.0–15.0)
MCH: 28.3 pg (ref 26.0–34.0)
MCHC: 31.8 g/dL (ref 30.0–36.0)
MCV: 89.1 fL (ref 80.0–100.0)
Platelets: 233 10*3/uL (ref 150–400)
RBC: 3.21 MIL/uL — ABNORMAL LOW (ref 3.87–5.11)
RDW: 15.2 % (ref 11.5–15.5)
WBC: 9.2 10*3/uL (ref 4.0–10.5)
nRBC: 0 % (ref 0.0–0.2)

## 2021-11-27 LAB — BASIC METABOLIC PANEL
Anion gap: 7 (ref 5–15)
BUN: 27 mg/dL — ABNORMAL HIGH (ref 8–23)
CO2: 24 mmol/L (ref 22–32)
Calcium: 8.4 mg/dL — ABNORMAL LOW (ref 8.9–10.3)
Chloride: 109 mmol/L (ref 98–111)
Creatinine, Ser: 0.93 mg/dL (ref 0.44–1.00)
GFR, Estimated: >60 mL/min (ref >60)
Glucose, Bld: 104 mg/dL — ABNORMAL HIGH (ref 70–99)
Potassium: 3.8 mmol/L (ref 3.5–5.1)
Sodium: 140 mmol/L (ref 135–145)

## 2021-11-27 LAB — HEPARIN LEVEL (UNFRACTIONATED): Heparin Unfractionated: 0.48 IU/mL (ref 0.30–0.70)

## 2021-11-27 LAB — MAGNESIUM: Magnesium: 2.2 mg/dL (ref 1.7–2.4)

## 2021-11-27 MED ORDER — PERFLUTREN LIPID MICROSPHERE
1.0000 mL | INTRAVENOUS | Status: AC | PRN
  Administered 2021-11-27: 2 mL via INTRAVENOUS

## 2021-11-27 MED ORDER — POTASSIUM CHLORIDE CRYS ER 20 MEQ PO TBCR
20.0000 meq | EXTENDED_RELEASE_TABLET | Freq: Once | ORAL | Status: AC
Start: 2021-11-27 — End: 2021-11-27
  Administered 2021-11-27: 20 meq via ORAL
  Filled 2021-11-27: qty 1

## 2021-11-27 MED ORDER — APIXABAN 2.5 MG PO TABS
2.5000 mg | ORAL_TABLET | Freq: Two times a day (BID) | ORAL | Status: DC
  Administered 2021-11-27 – 2021-12-03 (×13): 2.5 mg via ORAL
  Filled 2021-11-27 (×13): qty 1

## 2021-11-27 NOTE — Progress Notes (Addendum)
Progress Note  Patient Name: Debbie [name] Date of Encounter: 11/27/2021  [facility] Cardiologist: Rozann Lesches, MD   Subjective   Blood pressure is improved with some IV saline.  Lying flat in bed without chest pain or discomfort.  Interested in going home.  No groin bleeding on IV heparin.  Inpatient Medications    Scheduled Meds:  apixaban  2.5 mg Oral BID   aspirin  81 mg Oral Daily   atorvastatin  80 mg Oral QHS   Chlorhexidine Gluconate Cloth  6 each Topical Daily   cholecalciferol  1,000 Units Oral Daily   ezetimibe  10 mg Oral Daily   levothyroxine  88 mcg Oral Daily   montelukast  10 mg Oral QHS   pantoprazole  40 mg Oral Daily   sodium chloride flush  3 mL Intravenous Q12H   sodium chloride flush  3 mL Intravenous Q12H   ticagrelor  90 mg Oral BID   Continuous Infusions:  sodium chloride     sodium chloride     PRN Meds: sodium chloride, sodium chloride, acetaminophen, albuterol, ALPRAZolam, nitroGLYCERIN, ondansetron (ZOFRAN) IV, mouth rinse, sodium chloride flush, sodium chloride flush, zolpidem   Vital Signs    Vitals:   11/27/21 0600 11/27/21 0700 11/27/21 0722 11/27/21 0830  BP:    93/64  Pulse: 74 85  86  Resp: 20 18  (!) 21  Temp:   98.9 F (37.2 C)   TempSrc:   Oral   SpO2: 95% 96%  96%  Weight: 60.4 kg     Height:        Intake/Output Summary (Last 24 hours) at 11/27/2021 0924 Last data filed at 11/27/2021 0700 Gross per 24 hour  Intake 1445.44 ml  Output 775 ml  Net 670.44 ml      11/27/2021    6:00 AM 11/25/2021    5:00 AM 11/24/2021    9:00 AM  Last 3 Weights  Weight (lbs) 133 lb 3.2 oz 130 lb 4.7 oz 130 lb 4.7 oz  Weight (kg) 60.419 kg 59.1 kg 59.1 kg      Telemetry    Sinus rhythm- Personally Reviewed  ECG    Performed 11/25/2021 demonstrates anterior precordial deep symmetrical T wave inversion and bradycardia.- Personally Reviewed  Physical Exam  Frail in appearance GEN: No acute distress.   Neck: No  JVD Vasculature: Right femoral ecchymosis.  No hematoma. Cardiac: RRR, no murmurs, rubs, or gallops.  Respiratory: Clear to auscultation bilaterally. GI: Soft, nontender, non-distended  MS: No edema; No deformity. Neuro:  Nonfocal  Psych: Normal affect   Labs    High Sensitivity Troponin:   Recent Labs  Lab 11/22/21 1552 11/22/21 1752 11/24/21 0830  TROPONINIHS 208* 420* >24,000*     Chemistry Recent Labs  Lab 11/22/21 2230 11/23/21 0049 11/25/21 0017 11/26/21 0006 11/27/21 0101  NA  --    < > 142 139 140  K  --    < > 4.3 3.7 3.8  CL  --    < > 109 105 109  CO2  --    < > 23 23 24   GLUCOSE  --    < > 177* 118* 104*  BUN  --    < > 35* 42* 27*  CREATININE  --    < > 1.70* 1.34* 0.93  CALCIUM  --    < > 8.8* 8.6* 8.4*  MG  --   --   --   --  2.2  PROT 5.6*  --   --   --   --   ALBUMIN 3.2*  --   --   --   --   AST 43*  --   --   --   --   ALT 16  --   --   --   --   ALKPHOS 60  --   --   --   --   BILITOT 0.9  --   --   --   --   GFRNONAA  --    < > 30* 40* >60  ANIONGAP  --    < > 10 11 7    < > = values in this interval not displayed.    Lipids  Recent Labs  Lab 11/23/21 0049  CHOL 209*  TRIG 79  HDL 55  LDLCALC 138*  CHOLHDL 3.8    Hematology Recent Labs  Lab 11/25/21 0731 11/26/21 0006 11/26/21 0547 11/27/21 0101  WBC 12.5* 11.2*  --  9.2  RBC 3.73* 3.40*  --  3.21*  HGB 10.4* 9.4* 9.5* 9.1*  HCT 33.3* 30.8* 30.6* 28.6*  MCV 89.3 90.6  --  89.1  MCH 27.9 27.6  --  28.3  MCHC 31.2 30.5  --  31.8  RDW 15.6* 15.6*  --  15.2  PLT 293 247  --  233   Thyroid  Recent Labs  Lab 11/23/21 0049  TSH 7.409*  FREET4 1.08    BNPNo results for input(s): "BNP", "PROBNP" in the last 168 hours.  DDimer No results for input(s): "DDIMER" in the last 168 hours.   Radiology    VAS 01/24/22 GROIN PSEUDOANEURYSM  Result Date: 11/26/2021  ARTERIAL PSEUDOANEURYSM  Patient Name:  Debbie [name]  Date of Exam:   11/25/2021 Medical Rec #: 01/26/2022             Accession #:    [id number] Date of Birth: 1941/03/21            Patient Gender: F Patient Age:   20 years Exam Location:  [facility] Procedure:      VAS [facility] [place] Referring Phys: Bobetta Lime O'NEAL --------------------------------------------------------------------------------  Exam: Right groin Indications: Patient complains of groin pain and bruising. History: S/p catheterization 11/23/21 and 11/24/21. Comparison Study: No prior study on file Performing Technologist: 01/25/22 RVS  Examination Guidelines: A complete evaluation includes B-mode imaging, spectral Doppler, color Doppler, and power Doppler as needed of all accessible portions of each vessel. Bilateral testing is considered an integral part of a complete examination. Limited examinations for reoccurring indications may be performed as noted.  Summary: No evidence of pseudoaneurysm, AVF or DVT  Diagnosing physician: Sherren Kerns MD Electronically signed by Sherald Hess MD on 11/26/2021 at 1:41:57 PM.   --------------------------------------------------------------------------------    Final     Cardiac Studies   CARDIAC CATH PCI 11/24/2021: Diagnostic Dominance: Left  Intervention    Patient Profile     81 y.o. female  status post CABG, hypertension, hyperlipidemia who was admitted on 11/22/2021 for non-STEMI.  Underwent left heart catheterization with PCI in the left main into left circumflex.  Course complicated on 11/24/2021 with anterior ST elevation in no wall motion abnormality.  Assessment & Plan    CAD/CABG presenting with non-ST elevation MI: Persistent anterior T wave inversion.  No recurrent angina. Inferoapical left ventricular thrombus: Heparin this evening and transition to Eliquis tomorrow if no further bleeding. Right femoral hematoma: Ecchymosis but no hematoma.  She  will be on triple therapy with aspirin/Plavix/Eliquis for 1 month then drop aspirin. 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  Blood pressure is still soft.  Unable to add guideline directed therapy for systolic heart failure at this time.  Consider SGLT2 therapy.  Repeat echocardiogram. Acute kidney injury: Creatinine is improving with downward trend to 1.34 today. Impaired memory: Not assessed Low blood pressure: Somewhat improved after IV saline.  Transfer to telemetry.  Physical therapy evaluation.  Probable discharge tomorrow.    For questions or updates, please contact [facility] Please consult [email or web address] for contact info under        Signed, Lesleigh Noe, MD  11/27/2021, 9:24 AM

## 2021-11-27 NOTE — Progress Notes (Signed)
  Echocardiogram 2D Echocardiogram with contrast has been performed.  Debbie [name] 11/27/2021, 3:51 PM

## 2021-11-27 NOTE — Progress Notes (Signed)
ANTICOAGULATION CONSULT NOTE  Pharmacy Consult for IV heparin Indication: LV thrombus  Allergies  Allergen Reactions   Ivp Dye [Iodinated Contrast Media] Swelling    Kidney Dye   Betadine [Povidone Iodine] Rash   Codeine Nausea And Vomiting   Other Nausea And Vomiting    Narcotics - nausea,vomiting   Penicillins Rash    Did it involve swelling of the face/tongue/throat, SOB, or low BP? No Did it involve sudden or severe rash/hives, skin peeling, or any reaction on the inside of your mouth or nose? No Did you need to seek medical attention at a hospital or doctor's office? No When did it last happen?       If all above answers are "NO", may proceed with cephalosporin use.    Sulfa Antibiotics Rash    Patient Measurements: Height: 5\' 2"  (157.5 cm) Weight: 60.4 kg (133 lb 3.2 oz) IBW/kg (Calculated) : 50.1 Heparin Dosing Weight: 59.1 kg  Vital Signs: Temp: 98.9 F (37.2 C) (07/11 0722) Temp Source: Oral (07/11 0722) BP: 93/64 (07/11 0830) Pulse Rate: 86 (07/11 0830)  Labs: Recent Labs    11/25/21 0017 11/25/21 0731 11/26/21 0006 11/26/21 0547 11/26/21 2230 11/27/21 0101 11/27/21 0754  HGB 11.4* 10.4* 9.4* 9.5*  --  9.1*  --   HCT 36.1 33.3* 30.8* 30.6*  --  28.6*  --   PLT 323 293 247  --   --  233  --   LABPROT  --   --  13.6  --   --   --   --   INR  --   --  1.1  --   --   --   --   HEPARINUNFRC  --   --   --   --  0.13*  --  0.48  CREATININE 1.70*  --  1.34*  --   --  0.93  --      Estimated Creatinine Clearance: 41.3 mL/min (by C-G formula based on SCr of 0.93 mg/dL).   Medical History: Past Medical History:  Diagnosis Date   Allergic rhinitis    Anxiety    Arthritis    Asthma    Bladder spasms    CAD (coronary artery disease)    CABG 2006 [facility]   Carotid artery disease (HCC)    Collagen vascular disease (HCC)    COPD (chronic obstructive pulmonary disease) (HCC)    Cystocele    Essential hypertension    GERD (gastroesophageal  reflux disease)    Hyperlipidemia    Hypothyroidism    Osteoporosis    Renal insufficiency    Vaginal vault prolapse     Medications:  Infusions:   sodium chloride     sodium chloride      Assessment: 81 yo female s/p NSTEMI, found with LV thrombus.  Also with groin hematoma s/p cath, Hgb down 11.4 > 9.4.  Site stable today and pharmacy asked to start IV heparin for LV thrombus.  On heparin overnight, hematoma site looks stable.  OK to switch to po Eliquis per discussion with Dr. 96 (cards).  Pt is 59-60 kg, age 64 with Scr currently < 1.5 although was elevated this admission to 1.7  Goal of Therapy:   Monitor platelets by anticoagulation protocol: Yes   Plan:  Stop IV heparin. Start Eliquis 2.5 mg bid.  Reduced dosing due to age, weight and triple therapy with ASA, Brilinta x 1 mo. (Then DAPT)  96, [facility] Clinical Pharmacist  11/27/2021 9:42 AM   [facility] pharmacy phone numbers are listed on [email or web address]

## 2021-11-27 NOTE — Discharge Instructions (Signed)
Information on my medicine - ELIQUIS (apixaban)  This medication education was reviewed with me or my healthcare representative as part of my discharge preparation.  The pharmacist that spoke with me during my hospital stay was:  Gardner Candle, [facility]  Why was Eliquis prescribed for you? Eliquis was prescribed for you to reduce the risk of a blood clot forming that can cause a stroke if you have a medical condition called LV thrombus (clot in the heart)  What do You need to know about Eliquis ? Take your Eliquis TWICE DAILY - one tablet in the morning and one tablet in the evening with or without food. If you have difficulty swallowing the tablet whole please discuss with your pharmacist how to take the medication safely.  Take Eliquis exactly as prescribed by your doctor and DO NOT stop taking Eliquis without talking to the doctor who prescribed the medication.  Stopping may increase your risk of developing a stroke.  Refill your prescription before you run out.  After discharge, you should have regular check-up appointments with your healthcare provider that is prescribing your Eliquis.  In the future your dose may need to be changed if your kidney function or weight changes by a significant amount or as you get older.  What do you do if you miss a dose? If you miss a dose, take it as soon as you remember on the same day and resume taking twice daily.  Do not take more than one dose of ELIQUIS at the same time to make up a missed dose.  Important Safety Information A possible side effect of Eliquis is bleeding. You should call your healthcare provider right away if you experience any of the following: Bleeding from an injury or your nose that does not stop. Unusual colored urine (red or dark brown) or unusual colored stools (red or black). Unusual bruising for unknown reasons. A serious fall or if you hit your head (even if there is no bleeding).  Some medicines may interact with  Eliquis and might increase your risk of bleeding or clotting while on Eliquis. To help avoid this, consult your healthcare provider or pharmacist prior to using any new prescription or non-prescription medications, including herbals, vitamins, non-steroidal anti-inflammatory drugs (NSAIDs) and supplements.  This website has more information on Eliquis (apixaban): http://www.eliquis.com/eliquis/home

## 2021-11-27 NOTE — TOC Benefit Eligibility Note (Addendum)
Patient Advocate Encounter ? ?Insurance verification completed.   ? ?The patient is currently admitted and upon discharge could be taking Brilinta 90 mg. ? ?The current 30 day co-pay is, $45.00.  ? ?The patient is currently admitted and upon discharge could be taking Jardiance 10 mg. ? ?The current 30 day co-pay is, $45.00.  ? ?The patient is currently admitted and upon discharge could be taking Farxiga 10 mg. ? ?The current 30 day co-pay is, $95.00.  ? ?The patient is insured through [employer] Part D  ? ? ? ?Makinlee Awwad, CPhT ?Pharmacy Patient Advocate Specialist ?[facility] Patient Advocate Team ?Direct Number: [phone]  Fax: [phone] ? ? ? ? ? ?  ?

## 2021-11-27 NOTE — Progress Notes (Signed)
ANTICOAGULATION CONSULT NOTE - Follow Up Consult  Pharmacy Consult for heparin Indication:  LV thrombus  Labs: Recent Labs    11/24/21 0307 11/24/21 0830 11/24/21 2008 11/25/21 0017 11/25/21 0731 11/26/21 0006 11/26/21 0547 11/26/21 2230  HGB 15.0  --    < > 11.4* 10.4* 9.4* 9.5*  --   HCT 44.6  --    < > 36.1 33.3* 30.8* 30.6*  --   PLT 481*  --    < > 323 293 247  --   --   LABPROT  --   --   --   --   --  13.6  --   --   INR  --   --   --   --   --  1.1  --   --   HEPARINUNFRC  --   --   --   --   --   --   --  0.13*  CREATININE 1.42*  --   --  1.70*  --  1.34*  --   --   TROPONINIHS  --  >24,000*  --   --   --   --   --   --    < > = values in this interval not displayed.    Assessment: 80yo female subtherapeutic on heparin with initial cautious dosing for LV thrombus; no infusion issues per RN; he reports no worsening of groin hematoma, IV site is oozing slightly.  Goal of Therapy:  Heparin level 0.3-0.5 units/ml   Plan:  Will increase heparin infusion by 3-4 units/kg/hr to 1000 units/hr and check level in 8 hours.    Vernard Gambles, PharmD, BCPS  11/27/2021,12:06 AM

## 2021-11-27 NOTE — Evaluation (Addendum)
Physical Therapy Evaluation Patient Details Name: Debbie [name] MRN: [id number] DOB: [birth year] Today's Date: 11/27/2021  History of Present Illness  Pt is an 81 y.o. female admitted 11/22/21 with NSTEMI. S/p LHC with PCI in L main to L circumflex. Course complicated on 7/8 with anterior ST elevation in no wall motion abnormality. PMH includes CAD, COPD, HTN, asthma, arthritis, GERD, osteoporosis, anxiety.   Clinical Impression  Pt presents with an overall decrease in functional mobility secondary to above. PTA, pt independent, lives alone, stays active managing household. Today, pt initially anxious regarding c/o feeling SOB and weak, requiring redirection and encouragement to mobilize; pt ultimately able to ambulate in room and carry full conversation with minimal SOB noted, though pt declines further distance due to feeling SOB. Pt reports hopeful for d/c home tomorrow, will have necessary support from family and friends. If to remain admitted, will follow acutely to address established goals.    SpO2 96% on RA, HR 88-102   Recommendations for follow up therapy are one component of a multi-disciplinary discharge planning process, led by the attending physician.  Recommendations may be updated based on patient status, additional functional criteria and insurance authorization.  Follow Up Recommendations Home health PT      Assistance Recommended at Discharge Intermittent Supervision/Assistance  Patient can return home with the following  Assistance with cooking/housework;Assist for transportation;Help with stairs or ramp for entrance    Equipment Recommendations None recommended by PT (reports owning rolling walker)  Recommendations for Other Services    Mobility Specialist   Functional Status Assessment Patient has had a recent decline in their functional status and demonstrates the ability to make significant improvements in function in a reasonable and predictable amount of time.      Precautions / Restrictions Precautions Precautions: Fall Restrictions Weight Bearing Restrictions: No      Mobility  Bed Mobility Overal bed mobility: Needs Assistance Bed Mobility: Supine to Sit, Sit to Supine     Supine to sit: Min assist Sit to supine: Modified independent (Device/Increase time)   General bed mobility comments: minA for HHA to elevate trunk; return to supine in flat bed without assist    Transfers Overall transfer level: Needs assistance   Transfers: Sit to/from Stand Sit to Stand: Min guard           General transfer comment: min guard for balance    Ambulation/Gait Ambulation/Gait assistance: Min guard, Min assist Gait Distance (Feet): 28 Feet Assistive device: 1 person hand held assist Gait Pattern/deviations: Step-through pattern, Decreased stride length, Trunk flexed Gait velocity: Decreased     General Gait Details: slow, fatigued gait with minA for HHA to stabilize; pt declines further distance due to c/o fatigue and SOB; no DOE noted. pt reports plan to use RW initially at home  Stairs            Wheelchair Mobility    Modified Rankin (Stroke Patients Only)       Balance Overall balance assessment: Needs assistance   Sitting balance-Leahy Scale: Good     Standing balance support: No upper extremity supported, During functional activity Standing balance-Leahy Scale: Fair Standing balance comment: can stand and take steps without UE support; preference for HHA or RW                             Pertinent Vitals/Pain Pain Assessment Pain Assessment: No/denies pain Pain Intervention(s): Monitored during session    Home Living  Family/patient expects to be discharged to:: Private residence Living Arrangements: Alone Available Help at Discharge: Family;Friend(s);Available PRN/intermittently Type of Home: House Home Access: Stairs to enter Entrance Stairs-Rails: Right Entrance Stairs-Number of Steps:  2   Home Layout: One level Home Equipment: Agricultural consultant (2 wheels);Cane - single point;Shower seat      Prior Function Prior Level of Function : Independent/Modified Independent             Mobility Comments: independent without DME; active in home with housework, etc.; no longer goes to gym; friends provide transportation; enjoys going to church ADLs Comments: sits on tub seat to shower     Hand Dominance        Extremity/Trunk Assessment   Upper Extremity Assessment Upper Extremity Assessment: Overall WFL for tasks assessed    Lower Extremity Assessment Lower Extremity Assessment: Generalized weakness       Communication   Communication: No difficulties  Cognition Arousal/Alertness: Awake/alert Behavior During Therapy: WFL for tasks assessed/performed, Anxious Overall Cognitive Status: Within Functional Limits for tasks assessed                                 General Comments: initially tearful and states, "I can't do anything, I'm so weak and short of breath" - ultimately able to have full conversation and mobilize without distress; seems more related to anxiety (?) of recurring symptoms that led to admission        General Comments General comments (skin integrity, edema, etc.): educ re: activity recommendations, fall risk reduction, DME use, importance of mobility. SpO2 96% on RA, HR 88-102    Exercises     Assessment/Plan    PT Assessment Patient needs continued PT services  PT Problem List Decreased strength;Decreased activity tolerance;Decreased balance;Decreased mobility;Cardiopulmonary status limiting activity       PT Treatment Interventions DME instruction;Gait training;Stair training;Functional mobility training;Therapeutic activities;Therapeutic exercise;Balance training;Patient/family education    PT Goals (Current goals can be found in the Care Plan section)  Acute Rehab PT Goals Patient Stated Goal: return home, interested  in HHPT services PT Goal Formulation: With patient Time For Goal Achievement: 12/11/21 Potential to Achieve Goals: Good    Frequency Min 3X/week     Co-evaluation               AM-PAC PT "6 Clicks" Mobility  Outcome Measure Help needed turning from your back to your side while in a flat bed without using bedrails?: None Help needed moving from lying on your back to sitting on the side of a flat bed without using bedrails?: A Little Help needed moving to and from a bed to a chair (including a wheelchair)?: A Little Help needed standing up from a chair using your arms (e.g., wheelchair or bedside chair)?: A Little Help needed to walk in hospital room?: A Little Help needed climbing 3-5 steps with a railing? : A Little 6 Click Score: 19    End of Session   Activity Tolerance: Patient tolerated treatment well;Patient limited by fatigue Patient left: in bed;with call bell/phone within reach Nurse Communication: Mobility status PT Visit Diagnosis: Other abnormalities of gait and mobility (R26.89);Muscle weakness (generalized) (M62.81)    Time: 0240-9735 PT Time Calculation (min) (ACUTE ONLY): 17 min   Charges:   PT Evaluation $PT Eval Moderate Complexity: 1 Mod        Ina Homes, PT, DPT Acute Rehabilitation Services  Personal: Secure Chat Rehab Office:  [place] 11/27/2021, 3:37 PM

## 2021-11-27 NOTE — Telephone Encounter (Addendum)
Pharmacy Patient Advocate Encounter  Insurance verification completed.    The patient is insured through [employer] Part D   The patient is currently admitted and ran test claims for the following: Brilinta 90 mg Farxiga 10 mg, Jardiance 10 mg.  Copays and coinsurance results were relayed to Inpatient clinical team.

## 2021-11-27 NOTE — Progress Notes (Signed)
CARDIAC REHAB PHASE I   PRE:  Rate/Rhythm: 75  BP:  Sitting: 122/81       SaO2: 95 RA  MODE:  Ambulation: 170 ft   POST:  Rate/Rhythm: 120   BP:  Sitting: 125/71    SaO2: 97 RA   Pt agreeable to ambulation. Pt ambulated 130ft in hallway assist of one with rollator. Pt shows increased strength, denies CP, SOB, dizziness, or weakness. Pt returned to recliner. Educated on importance of ASA, Plavix, and Eliquis. Reviewed site care, restrictions, and exercise guidelines. Pt has a cane at home, but no walker. Pt oozing from IV site, RN aware. Referred to [facility] [place].   [id number] Reynold Bowen, RN BSN 11/27/2021 10:54 AM

## 2021-11-28 ENCOUNTER — Other Ambulatory Visit (HOSPITAL_COMMUNITY): Payer: Self-pay

## 2021-11-28 DIAGNOSIS — I513 Intracardiac thrombosis, not elsewhere classified: Secondary | ICD-10-CM | POA: Diagnosis present

## 2021-11-28 DIAGNOSIS — J438 Other emphysema: Secondary | ICD-10-CM | POA: Diagnosis not present

## 2021-11-28 DIAGNOSIS — R531 Weakness: Secondary | ICD-10-CM | POA: Diagnosis not present

## 2021-11-28 DIAGNOSIS — I214 Non-ST elevation (NSTEMI) myocardial infarction: Secondary | ICD-10-CM | POA: Diagnosis not present

## 2021-11-28 DIAGNOSIS — I249 Acute ischemic heart disease, unspecified: Secondary | ICD-10-CM | POA: Diagnosis not present

## 2021-11-28 DIAGNOSIS — R319 Hematuria, unspecified: Secondary | ICD-10-CM | POA: Diagnosis present

## 2021-11-28 LAB — BASIC METABOLIC PANEL
Anion gap: 11 (ref 5–15)
BUN: 11 mg/dL (ref 8–23)
CO2: 22 mmol/L (ref 22–32)
Calcium: 8.7 mg/dL — ABNORMAL LOW (ref 8.9–10.3)
Chloride: 107 mmol/L (ref 98–111)
Creatinine, Ser: 0.86 mg/dL (ref 0.44–1.00)
GFR, Estimated: >60 mL/min (ref >60)
Glucose, Bld: 172 mg/dL — ABNORMAL HIGH (ref 70–99)
Potassium: 3.3 mmol/L — ABNORMAL LOW (ref 3.5–5.1)
Sodium: 140 mmol/L (ref 135–145)

## 2021-11-28 LAB — CBC
HCT: 30.5 % — ABNORMAL LOW (ref 36.0–46.0)
Hemoglobin: 10.1 g/dL — ABNORMAL LOW (ref 12.0–15.0)
MCH: 28.6 pg (ref 26.0–34.0)
MCHC: 33.1 g/dL (ref 30.0–36.0)
MCV: 86.4 fL (ref 80.0–100.0)
Platelets: 304 10*3/uL (ref 150–400)
RBC: 3.53 MIL/uL — ABNORMAL LOW (ref 3.87–5.11)
RDW: 14.9 % (ref 11.5–15.5)
WBC: 11.7 10*3/uL — ABNORMAL HIGH (ref 4.0–10.5)
nRBC: 0 % (ref 0.0–0.2)

## 2021-11-28 MED ORDER — EZETIMIBE 10 MG PO TABS
10.0000 mg | ORAL_TABLET | Freq: Every day | ORAL | 11 refills | Status: DC
  Filled 2021-11-28: qty 30, 30d supply, fill #0

## 2021-11-28 MED ORDER — DAPAGLIFLOZIN PROPANEDIOL 10 MG PO TABS
10.0000 mg | ORAL_TABLET | Freq: Every day | ORAL | Status: DC
  Administered 2021-11-28 – 2021-11-30 (×3): 10 mg via ORAL
  Filled 2021-11-28 (×3): qty 1

## 2021-11-28 MED ORDER — POTASSIUM CHLORIDE CRYS ER 10 MEQ PO TBCR
20.0000 meq | EXTENDED_RELEASE_TABLET | Freq: Every day | ORAL | 3 refills | Status: DC | PRN
  Filled 2021-11-28: qty 60, 30d supply, fill #0

## 2021-11-28 MED ORDER — DAPAGLIFLOZIN PROPANEDIOL 10 MG PO TABS
10.0000 mg | ORAL_TABLET | Freq: Every day | ORAL | 11 refills | Status: DC
  Filled 2021-11-28: qty 30, 30d supply, fill #0
  Filled 2021-11-28: qty 30, 30d supply, fill #1

## 2021-11-28 MED ORDER — POTASSIUM CHLORIDE CRYS ER 20 MEQ PO TBCR
60.0000 meq | EXTENDED_RELEASE_TABLET | Freq: Once | ORAL | Status: AC
Start: 2021-11-28 — End: 2021-11-28
  Administered 2021-11-28: 60 meq via ORAL
  Filled 2021-11-28: qty 3

## 2021-11-28 MED ORDER — APIXABAN 2.5 MG PO TABS
2.5000 mg | ORAL_TABLET | Freq: Two times a day (BID) | ORAL | 11 refills | Status: DC
  Filled 2021-11-28: qty 60, 30d supply, fill #0
  Filled 2021-11-28: qty 60, 30d supply, fill #1

## 2021-11-28 MED ORDER — STERILE WATER FOR INJECTION IJ SOLN
INTRAMUSCULAR | Status: AC
  Administered 2021-11-28: 10 mL
  Filled 2021-11-28: qty 10

## 2021-11-28 MED ORDER — FUROSEMIDE 40 MG PO TABS
40.0000 mg | ORAL_TABLET | Freq: Every day | ORAL | 3 refills | Status: DC | PRN
  Filled 2021-11-28: qty 30, 30d supply, fill #0

## 2021-11-28 MED ORDER — OLANZAPINE 10 MG IM SOLR
5.0000 mg | Freq: Once | INTRAMUSCULAR | Status: AC | PRN
  Administered 2021-11-28: 5 mg via INTRAMUSCULAR
  Filled 2021-11-28 (×2): qty 10

## 2021-11-28 MED ORDER — TICAGRELOR 90 MG PO TABS
90.0000 mg | ORAL_TABLET | Freq: Two times a day (BID) | ORAL | 11 refills | Status: DC
  Filled 2021-11-28: qty 60, 30d supply, fill #0
  Filled 2021-11-28: qty 60, 30d supply, fill #1
  Filled 2021-11-28: qty 180, 90d supply, fill #1

## 2021-11-28 NOTE — Progress Notes (Signed)
Patient is confused and combative at times, patient continues to call 911 on her personal cell phone, RN called patient's daughter Rikki Spearing who advised that it was okay to take patient's cell phone to prevent the patient from calling 911.  RN will continue to monitor patient

## 2021-11-28 NOTE — Progress Notes (Signed)
CARDIAC REHAB PHASE I   Pts bed alarm ringing, pt stating needing use of BR. Pt very impulsive and hard to redirect. Pt assisted to [facility] than to recliner with front wheel walker. Pt very SOB, sats maintaining 99 on RA. Set-up lunch for pt. Pt voices feeling "uneasy", provided support. Blood noted in [facility], RN and APP aware. Chair alarm on, bedside table and call bell within reach.  [id number] Reynold Bowen, RN BSN 11/28/2021 11:14 AM

## 2021-11-28 NOTE — Plan of Care (Signed)
  Problem: Nutrition: Goal: Adequate nutrition will be maintained Outcome: Progressing   Problem: Pain Managment: Goal: General experience of comfort will improve Outcome: Progressing   

## 2021-11-28 NOTE — Care Management Important Message (Signed)
Important Message  Patient Details  Name: Debbie [name] MRN: [id number] Date of Birth: 06-13-1940   Medicare Important Message Given:  Yes     Renie Ora 11/28/2021, 11:52 AM

## 2021-11-28 NOTE — Discharge Summary (Addendum)
The patient has been seen in conjunction with Fabian Sharp, PAC. All aspects of care have been considered and discussed. The patient has been personally interviewed, examined, and all clinical data has been reviewed.  The patient waited until the last minute to tell me she is still having hematuria. I am sure this is related to Elliquis and Brilinta. Aspirin was stopped earlier in week. After discussing, she is very concerned and wanted to see Urologist near home. Given cognitive issues, have requested IP GU consult.  Brilinta has been stopped and Plavix substituted to decrease risk of bleeding. May discharge today if urology w/u complete and patient still able to get home.    Discharge Summary    Patient ID: Debbie [name] MRN: [id number]; DOB: 07/07/1940  Admit date: 11/22/2021 Discharge date: 11/30/2021  PCP:  Caryl Bis, MD   [facility] Providers Cardiologist:  Rozann Lesches, MD   Discharge Diagnoses    Principal Problem:   NSTEMI (non-ST elevated myocardial infarction) [facility]) Active Problems:   Essential hypertension   Hyperlipidemia   Generalized weakness   COPD (chronic obstructive pulmonary disease) (HCC)   Bradycardia   Chest pain with moderate risk for cardiac etiology   ACS (acute coronary syndrome) ([place])   LV (left ventricular) mural thrombus   Hematuria   Acute on chronic combined systolic and diastolic CHF (congestive heart failure) ([place])   Status post coronary artery stent placement    Diagnostic Studies/Procedures    Left heart cath 11/24/21:   Dist LM to Prox LAD lesion is 100% stenosed.   Ost LM to Dist LM lesion is 25% stenosed.   Dist Cx lesion is 50% stenosed.   Origin to Prox Graft lesion is 99% stenosed.   Previously placed Ost Cx to Prox Cx stent of unknown type is  widely patent.   A drug-eluting stent was successfully placed using a STENT ONYX FRONTIER 3.0X18.   Post intervention, there is a 0% residual stenosis. _____________    Limited Echo 11/27/21:  1. Follow up limited study with Definity shows persistent apical left  ventricular thrombus. The clot now appears fixed, although with a  relatively narrow attachment. Width 7 mm, length 13 mm.. Left ventricular  ejection fraction, by estimation, is 25 to  30%. The left ventricle has severely decreased function.    History of Present Illness     Debbie [name] is a 81 y.o. female status post CABG, hypertension, hyperlipidemia who was admitted on 11/22/2021 for non-STEMI.  Underwent left heart catheterization with PCI in the left main into left circumflex.  Course complicated on 0/12/6759 with anterior ST elevation in no wall motion abnormality.  Hospital Course     Consultants: none  CAD s/p CABG, now PCI to left main-LCX Inferoapical LV thrombus - acute Hematuria  Right groin hematoma resolved, extensive ecchymosis Acute systolic heart failure - new ischemic cardiomyopathy AKI Hypertension - borderline BP this admission Pt presented with NSTEMI this admission. Heart catheterization 11/23/21 with severe native vessel CAD, patent LIMA-LAD and occluded vein grafts. PCI to native left main and native LCX with 4.0 x 25 mm DES. She was placed on ASA and brilinta x 12 months. Echocardiogram with LVEF 25-30% wit LV thrombus.  Eliquis was added to ASA and brilinta. She developed hematuria without clot and stable Hb (actually increased). No bleeding at cath site. Therefore, ASA was stopped and she was continued on brilinta and eliquis 2.5 mg BID. Unfortunately, hematuria persisted and brilinta was transitioned to  plavix with 300 mg load.  Pt developed ST elevation on EKG 11/24/21 without angina and was taken back to the cath lab for relook and found to have subtotal vein graft to diagonal branch occlusion treated with DES. Now with persistent anterior TWI  Course complicated by marginal BP resulting in holding GDMT, and AKI. AKI now resolved, but BP continues to be somewhat  soft and labile. BB held due to bradycardia, initially. Prior to discharge, we were able to add 12.5 mg toprol  GDMT: farxiga, toprol Consider PRN lasix with PRN K  Will attempt to add therapy as tolerated in the OP setting. Home medications that were held: 2.5 bisoprolol daily, 30 mg imdur, 5 mg amlodipine, and 20 mg lisinopril. Plan to add back imdur vs ARB/ARNI.   Hyperlipidemia with LDL goal < 70 Added zetia to 80 mg lipitor   Hematuria Will order outpatient referral to GU. Transitioned brilinta to plavix.    Disposition Discharge was delayed due to confusion following daytime ativan and nocturnal combativeness, likely due to PM ambien. Would avoid inpatient benzos in the future. She was evaluated by PT and deemed stable for discharge home with [facility] PT. Pt states she has friends to come stay with her. Appreciate SW help with discharge coordination.   Pt seen and examined by Dr. Tamala Julian and deemed stable for discharge. Cardiology follow has been arranged.   Did the patient have an acute coronary syndrome (MI, NSTEMI, STEMI, etc) this admission?:  Yes                               AHA/ACC Clinical Performance & Quality Measures: Aspirin prescribed? - No - triple therapy resulted in hematuria ADP Receptor Inhibitor (Plavix/Clopidogrel, Brilinta/Ticagrelor or Effient/Prasugrel) prescribed (includes medically managed patients)? - Yes Beta Blocker prescribed? - No - soft pressure High Intensity Statin (Lipitor 40-54m or Crestor 20-474m prescribed? - Yes EF assessed during THIS hospitalization? - Yes For EF <40%, was ACEI/ARB prescribed? - No - Reason:  BP For EF <40%, Aldosterone Antagonist (Spironolactone or Eplerenone) prescribed? - No - Reason:  BP Cardiac Rehab Phase II ordered (including medically managed patients)? - Yes       The patient will be scheduled for a TOC follow up appointment in 7-14 days.  A message has been sent to the [facility] at the office  where the patient should be seen for follow up.  _____________  Discharge Vitals Blood pressure 118/60, pulse 87, temperature 98 F (36.7 C), temperature source Oral, resp. rate 17, height _0  (1.575 m), weight 61.3 kg, SpO2 98 %.  Filed Weights   11/27/21 0600 11/27/21 2134 11/30/21 0448  Weight: 60.4 kg 61.4 kg 61.3 kg    Labs & Radiologic Studies    CBC Recent Labs    11/29/21 0606 11/30/21 0303  WBC 15.9* 13.2*  HGB 9.8* 8.8*  HCT 29.5* 26.9*  MCV 86.0 86.2  PLT 375 [id number] Basic Metabolic Panel Recent Labs    11/28/21 0544 11/29/21 0606  NA 140 142  K 3.3* 3.6  CL 107 107  CO2 22 21*  GLUCOSE 172* 161*  BUN 11 14  CREATININE 0.86 1.02*  CALCIUM 8.7* 9.3   Liver Function Tests No results for input(s): "AST", "ALT", "ALKPHOS", "BILITOT", "PROT", "ALBUMIN" in the last 72 hours. No results for input(s): "LIPASE", "AMYLASE" in the last 72 hours. High Sensitivity Troponin:   Recent Labs  Lab 11/22/21 1552 11/22/21 1752 11/24/21 0830  TROPONINIHS 208* 420* >24,000*    BNP Invalid input(s): "POCBNP" D-Dimer No results for input(s): "DDIMER" in the last 72 hours. Hemoglobin A1C No results for input(s): "HGBA1C" in the last 72 hours. Fasting Lipid Panel No results for input(s): "CHOL", "HDL", "LDLCALC", "TRIG", "CHOLHDL", "LDLDIRECT" in the last 72 hours. Thyroid Function Tests No results for input(s): "TSH", "T4TOTAL", "T3FREE", "THYROIDAB" in the last 72 hours.  Invalid input(s): "FREET3" _____________  ECHOCARDIOGRAM LIMITED  Result Date: 11/27/2021    ECHOCARDIOGRAM LIMITED REPORT   Patient Name:   Debbie [name] Date of Exam: 11/27/2021 Medical Rec #:  [id number]           Height:       62.0 in Accession #:    [id number]          Weight:       133.2 lb Date of Birth:  [birth year]           BSA:          1.608 m Patient Age:    22 years            BP:           125/71 mmHg Patient Gender: F                   HR:           75 bpm. Exam Location:   Inpatient Procedure: Limited Echo and Intracardiac Opacification Agent Indications:    CHF  History:        Patient has prior history of Echocardiogram examinations, most                 recent 11/24/2021. CAD and NSTEMI, Prior CABG, COPD; Risk                 Factors:Dyslipidemia and Former Smoker.  Sonographer:    Merrie Roof RDCS Referring Phys: [place]  1. Follow up limited study with Definity shows persistent apical left ventricular thrombus. The clot now appears fixed, although with a relatively narrow attachment. Width 7 mm, length 13 mm.. Left ventricular ejection fraction, by estimation, is 25 to 30%. The left ventricle has severely decreased function. FINDINGS  Left Ventricle: Follow up limited study with Definity shows persistent apical left ventricular thrombus. The clot now appears fixed, although with a relatively narrow attachment. Width 7 mm, length 13 mm. Left ventricular ejection fraction, by estimation, is 25 to 30%. The left ventricle has severely decreased function.  LV Wall Scoring: The apical anterior segment, apical inferior segment, and apex are dyskinetic. The mid anteroseptal segment, apical lateral segment, and mid anterior segment are akinetic. The apical septal segment is hypokinetic. The antero-lateral wall, inferior wall, posterior wall, basal anteroseptal segment, mid inferoseptal segment, basal anterior segment, and basal inferoseptal segment are normal. Pericardium: There is no evidence of pericardial effusion.  LV Volumes (MOD) LV vol d, MOD A2C: 39.3 ml LV vol d, MOD A4C: 76.5 ml LV vol s, MOD A2C: 27.9 ml LV vol s, MOD A4C: 38.1 ml LV SV MOD A2C:     11.4 ml LV SV MOD A4C:     76.5 ml LV SV MOD BP:      24.3 ml Dani Gobble Croitoru MD Electronically signed by Sanda Klein MD Signature Date/Time: 11/27/2021/12:18:50 PM    Final    VAS [place] GROIN PSEUDOANEURYSM  Result Date: 11/26/2021  ARTERIAL PSEUDOANEURYSM  Patient Name:  Debbie [name]  Date of Exam:    11/25/2021 Medical Rec #: [id number]            Accession #:    [id number] Date of Birth: 1940/12/27            Patient Gender: F Patient Age:   77 years Exam Location:  [facility] Procedure:      VAS [place] GROIN PSEUDOANEURYSM Referring Phys: [place] --------------------------------------------------------------------------------  Exam: Right groin Indications: Patient complains of groin pain and bruising. History: S/p catheterization 11/23/21 and 11/24/21. Comparison Study: No prior study on file Performing Technologist: Sharion Dove RVS  Examination Guidelines: A complete evaluation includes B-mode imaging, spectral Doppler, color Doppler, and power Doppler as needed of all accessible portions of each vessel. Bilateral testing is considered an integral part of a complete examination. Limited examinations for reoccurring indications may be performed as noted.  Summary: No evidence of pseudoaneurysm, AVF or DVT  Diagnosing physician: Monica Martinez MD Electronically signed by Monica Martinez MD on 11/26/2021 at 1:41:57 PM.   --------------------------------------------------------------------------------    Final    CARDIAC CATHETERIZATION  Result Date: 11/24/2021 Images from the original result were not included.   Dist LM to Prox LAD lesion is 100% stenosed.   Ost LM to Dist LM lesion is 25% stenosed.   Dist Cx lesion is 50% stenosed.   Origin to Prox Graft lesion is 99% stenosed.   Previously placed Ost Cx to Prox Cx stent of unknown type is  widely patent.   A drug-eluting stent was successfully placed using a STENT ONYX FRONTIER 3.0X18.   Post intervention, there is a 0% residual stenosis. Debbie [name] is a 80 y.o. female  [id number] LOCATION:  FACILITY: [facility] PHYSICIAN: Quay Burow, M.D. Jun 03, 1940 DATE OF PROCEDURE:  11/24/2021 DATE OF DISCHARGE: CARDIAC CATHETERIZATION / PCI DES Diag SVG History obtained from chart review.Debbie [name] is a 81 y.o. female with CAD status post CABG,  hypertension, hyperlipidemia who was admitted on 11/22/2021 for non-STEMI.  Underwent left heart catheterization with PCI in the left main into left circumflex.  Course complicated on 12/19/4233 with anterior ST elevation, severe LV dysfunction with an LAD wall motion abnormality.  Because of this Dr. Audie Box  felt that she should have relook coronary angiography.  The patient was however asymptomatic. PROCEDURE DESCRIPTION: The patient was brought to the second floor [facility] cath lab in the postabsorptive state.  She was premedicated with IV Solu-Medrol and Benadryl for contrast allergy prophylaxis.  Her right groin was prepped and shaved in usual sterile fashion. Xylocaine 1% was used for local anesthesia. A 5 French sheath was inserted into the right common femoral artery using standard Seldinger technique.  Ultrasound was used to identify the right common femoral artery and guide access.  A digital image was captured and placed the patient's chart.  5 French right left second Silastic catheters were used for selective coronary angiography, selective vein graft and IMA graft angiography and obtain left heart pressures.  Isovue dye was used for the entirety of the case (95 cc of Contrast total to patient).  Retrograde aortic, ventricular and pullback pressures were recorded.  The LVEDP was measured at 31 mmHg. Her left main into proximal circumflex stent was widely patent.  The LAD was occluded at the origin which was an old finding.  Her LIMA was small in caliber and was somewhat atretic.  The distal LAD was small after the anastomosis.  The nondominant right was not  visualized.  The sequential vein to an obtuse marginal branch and PDA was widely patent.  The vein to the diagonal branch was subtotally occluded with a 99% stenosis and TIMI I flow.  This did fill a diagonal branch and backfill the LAD.  I assume that this was the "culprit vessel", and plan is to perform PCI drug-eluting stenting. The patient  was already on aspirin and Brilinta.  She received 7000's of heparin with an ACT of 420.  Isovue dye is used for the entirety of the case.  Retroaortic pressures monitored to the case.  I crossed the proximal diagonal vein graft lesion with some difficulty.  I predilated with a 2 mm balloon.  There did appear to be a filling defect beyond the stenosis suggestive of thrombus.  I attempted to place a 4 mm spider distal protection device but this did not pass the proximal lesion and therefore I decided simply to stent and cover the angiographic thrombus.  I placed a 3 mm x 18 mm long Medtronic Onyx frontier drug-eluting stent from the ostium of the vein graft beyond the lesion in the incorporating the filling defect deployed at 14 atm (3.08 mm) resulting in reduction of a 99% proximal diagonal branch SV's G stenosis with TIMI I flow to 0% residual TIMI-3 flow.  There is no residual filling defect nor did there appear to be distal thromboembolic disease.  The guidewire and catheter were removed.  The sheath was was secured in place.  The patient did receive a bolus of Aggrastat and a brief infusion which was discontinued.   Widely patent left main into the circumflex stent performed yesterday by Dr. Burt Knack.  Atretic appearing LIMA to a small distal LAD.  Patent sequential vein to an OM and PDA with high-grade proximal SVG to diagonal vein graft stenosis probably representing the "culprit lesion with an excellent angiographic result after PCI drug-eluting stenting.  Hopefully her anterior wall stunned and will come back.  The sheath will be removed once ACT falls below 170 pressure held.  I am going to restart heparin 4 hours after sheath removal for 24 hours.  Given her elevated LVEDP, she probably should be quire some diuresis as well.  She left the lab in stable condition. Quay Burow. MD, [facility] 11/24/2021 12:51 PM    ECHOCARDIOGRAM LIMITED  Result Date: 11/24/2021    ECHOCARDIOGRAM LIMITED REPORT   Patient Name:    Debbie [name] Date of Exam: 11/24/2021 Medical Rec #:  [id number]           Height:       62.5 in Accession #:    [id number]          Weight:       128.3 lb Date of Birth:  11-23-1940           BSA:          1.592 m Patient Age:    40 years            BP:           115/91 mmHg Patient Gender: F                   HR:           73 bpm. Exam Location:  Inpatient Procedure: Limited Echo, Cardiac Doppler, Color Doppler and Intracardiac            Opacification Agent Indications:    NSTEMI I21.4  History:  Patient has prior history of Echocardiogram examinations, most                 recent 10/19/2021. CAD and NSTEMI, Prior CABG, COPD,                 Signs/Symptoms:Chest Pain; Risk Factors:Dyslipidemia and Former                 Smoker. Successful PCI of the native left main and native                 circumflex 11/23/21.  Sonographer:    Darlina Sicilian RDCS Referring Phys: Eleonore Chiquito, Marcello Moores  Sonographer Comments: Severe native vessel coronary artery disease with severe ostial left main stenosis, total occlusion of the LAD, severe stenosis in the distal left main/circumflex junction, severe diffuse stenosis of the distal AV circumflex, and a diffusely diseased nondominant RCA. IMPRESSIONS  1. Suspect small 0.5 x 1 mm mobile thrombus noted at the inferior apex (image 38) by Definity contrast - the enterior anterior wall and apex are akinetic. Left ventricular ejection fraction, by estimation, is 25 to 30%. The left ventricle has severely decreased function. The left ventricle demonstrates regional wall motion abnormalities (see scoring diagram/findings for description). There is severe akinesis of the left ventricular, entire anterior wall, anteroseptal wall, apical segment and inferoapical segment.  2. Right ventricular systolic function is low normal. The right ventricular size is normal. There is moderately elevated pulmonary artery systolic pressure.  3. The mitral valve is abnormal. Mild to moderate  mitral valve regurgitation.  4. The aortic valve is tricuspid. Aortic valve regurgitation is not visualized. Comparison(s): Changes from prior study are noted. 10/19/2021: LVEF 60-65%. Conclusion(s)/Recommendation(s): Critical findings reported to Dr. Audie Box and acknowledged at 11:59 am. FINDINGS  Left Ventricle: Suspect small 0.5 x 1 mm mobile thrombus noted at the inferior apex (image 38) by Definity contrast - the enterior anterior wall and apex are akinetic. Left ventricular ejection fraction, by estimation, is 25 to 30%. The left ventricle has severely decreased function. The left ventricle demonstrates regional wall motion abnormalities. Severe akinesis of the left ventricular, entire anterior wall, anteroseptal wall, apical segment and inferoapical segment. Definity contrast agent was given IV to delineate the left ventricular endocardial borders. Right Ventricle: The right ventricular size is normal. No increase in right ventricular wall thickness. Right ventricular systolic function is low normal. There is moderately elevated pulmonary artery systolic pressure. The tricuspid regurgitant velocity  is 3.06 m/s, and with an assumed right atrial pressure of 8 mmHg, the estimated right ventricular systolic pressure is 90.3 mmHg. Mitral Valve: The mitral valve is abnormal. There is mild calcification of the anterior and posterior mitral valve leaflet(s). Mild to moderate mitral valve regurgitation. Tricuspid Valve: The tricuspid valve is grossly normal. Tricuspid valve regurgitation is mild. Aortic Valve: The aortic valve is tricuspid. Aortic valve regurgitation is not visualized. Pulmonic Valve: The pulmonic valve was grossly normal. Pulmonic valve regurgitation is trivial.  LV Volumes (MOD) LV vol d, MOD A2C: 89.5 ml LV vol d, MOD A4C: 101.0 ml LV vol s, MOD A2C: 55.4 ml LV vol s, MOD A4C: 68.3 ml LV SV MOD A2C:     34.1 ml LV SV MOD A4C:     101.0 ml LV SV MOD BP:      33.9 ml AORTIC VALVE             PULMONIC  VALVE LVOT Vmax:   53.20 cm/s  RVOT Peak grad: 1  mmHg LVOT Vmean:  33.200 cm/s LVOT VTI:    0.081 m TRICUSPID VALVE TR Peak grad:   37.5 mmHg TR Vmax:        306.00 cm/s  SHUNTS Systemic VTI: 0.08 m Pulmonic VTI: 0.053 m Lyman Bishop MD Electronically signed by Lyman Bishop MD Signature Date/Time: 11/24/2021/12:01:54 PM    Final    CARDIAC CATHETERIZATION  Result Date: 11/23/2021 1.  Severe native vessel coronary artery disease with severe ostial left main stenosis, total occlusion of the LAD, severe stenosis in the distal left main/circumflex junction, severe diffuse stenosis of the distal AV circumflex, and a diffusely diseased nondominant RCA 2.  Status post aortocoronary bypass surgery with continued patency of the LIMA to LAD and occlusion of the saphenous vein grafts 3.  Successful PCI of the native left main and native circumflex using a 4.0 x 24 mm Synergy DES Recommendation: Dual antiplatelet therapy with aspirin and ticagrelor at least 12 months, aggressive risk reduction measures, suspect the patient occluded a vein graft causing her acute coronary syndrome.  There were no vein graft targets for PCI.  Her native left main into the circumflex is treated as this large territory is now potentially ischemic after occlusion of the sequential saphenous vein graft to OM and PLA.   DG Chest Port 1 View  Result Date: 11/22/2021 CLINICAL DATA:  Chest pain with weakness. EXAM: PORTABLE CHEST 1 VIEW COMPARISON:  Radiographs 12/31/2018 and 10/17/2021. Abdominal CT 10/17/2021. FINDINGS: 1644 hours. The heart size and mediastinal contours are stable status post median sternotomy. There is aortic atherosclerosis and a small hiatal hernia. Lower lung volumes with mildly increased patchy opacities at both lung bases, likely reflecting atelectasis. No confluent airspace opacity, significant pleural effusion or pneumothorax. No acute osseous findings are evident. Glenohumeral degenerative changes bilaterally and a  thoracolumbar scoliosis. Telemetry leads overlie the chest. IMPRESSION: 1. Lower lung volumes with probable resulting mild bibasilar atelectasis. No confluent airspace opacity or significant pleural effusion. 2. Aortic atherosclerosis post median sternotomy. Electronically Signed   By: Richardean Sale M.D.   On: 11/22/2021 16:58    Disposition   Pt is being discharged home today in good condition.  Follow-up Plans & Appointments     Follow-up Information     [facility], [facility] Follow up.   Specialty: Home Health Services Why: HHRN/PT arranged- they will contact you to schedule visits post discharge Contact information: [address] [place] [address] [phone]                Discharge Instructions     Amb Referral to Cardiac Rehabilitation   Complete by: As directed    Diagnosis:  NSTEMI Coronary Stents     After initial evaluation and assessments completed: Virtual Based Care may be provided alone or in conjunction with Phase 2 Cardiac Rehab based on patient barriers.: Yes   Diet - low sodium heart healthy   Complete by: As directed    Diet - low sodium heart healthy   Complete by: As directed    Discharge instructions   Complete by: As directed    No driving. No lifting over 5 lbs for 1 week. No sexual activity for 1 week. Keep procedure site clean & dry. If you notice increased pain, swelling, bleeding or pus, call/return!  You may shower, but no soaking baths/hot tubs/pools for 1 week.   She has extensive bruising at her groin site and hematuria. Monitor bleeding, collect CBC in 1 week.  Discharge instructions   Complete by: As directed    No driving. No lifting over 5 lbs for 1 week. No sexual activity for 1 week. Keep procedure site clean & dry. If you notice increased pain, swelling, bleeding or pus, call/return!  You may shower, but no soaking baths/hot tubs/pools for 1 week.   Discharge wound care:   Complete by: As directed    As  directed   Increase activity slowly   Complete by: As directed    Increase activity slowly   Complete by: As directed    No wound care   Complete by: As directed        Discharge Medications   Allergies as of 11/30/2021       Reactions   Ivp Dye [iodinated Contrast Media] Swelling   Kidney Dye   Betadine [povidone Iodine] Rash   Codeine Nausea And Vomiting   Other Nausea And Vomiting   Narcotics - nausea,vomiting   Penicillins Rash   Did it involve swelling of the face/tongue/throat, SOB, or low BP? No Did it involve sudden or severe rash/hives, skin peeling, or any reaction on the inside of your mouth or nose? No Did you need to seek medical attention at a hospital or doctor's office? No When did it last happen?       If all above answers are "NO", may proceed with cephalosporin use.   Sulfa Antibiotics Rash        Medication List     STOP taking these medications    amLODipine 5 MG tablet Commonly known as: NORVASC   aspirin 81 MG tablet   bisoprolol 5 MG tablet Commonly known as: ZEBETA   lisinopril 20 MG tablet Commonly known as: ZESTRIL   potassium chloride 10 MEQ CR capsule Commonly known as: MICRO-K Replaced by: potassium chloride 10 MEQ tablet       TAKE these medications    acetaminophen 650 MG CR tablet Commonly known as: TYLENOL Take 650 mg by mouth in the morning.   atorvastatin 80 MG tablet Commonly known as: LIPITOR Take 1 tablet (80 mg total) by mouth at bedtime.   baclofen 10 MG tablet Commonly known as: LIORESAL Take 10 mg by mouth daily.   clopidogrel 75 MG tablet Commonly known as: PLAVIX Take 1 tablet (75 mg total) by mouth daily. Start taking on: December 01, 2021   Eliquis 2.5 MG Tabs tablet Generic drug: apixaban Take 1 tablet (2.5 mg total) by mouth 2 (two) times daily.   ezetimibe 10 MG tablet Commonly known as: ZETIA Take 1 tablet (10 mg total) by mouth daily.   famotidine 20 MG tablet Commonly known as:  PEPCID Take 40 mg by mouth at bedtime.   Farxiga 10 MG Tabs tablet Generic drug: dapagliflozin propanediol Take 1 tablet (10 mg total) by mouth daily.   furosemide 40 MG tablet Commonly known as: LASIX Take 1 tablet (40 mg total) by mouth daily as needed (for weight gain of 3 lbs overnight or 5 lbs in 1 week.). What changed:  how much to take when to take this reasons to take this Another medication with the same name was removed. Continue taking this medication, and follow the directions you see here.   isosorbide mononitrate 30 MG 24 hr tablet Commonly known as: IMDUR Take 1 tablet (30 mg total) by mouth daily.   levocetirizine 5 MG tablet Commonly known as: XYZAL Take 5 mg by mouth every evening. Equate levocetirizine.   levothyroxine 88 MCG  tablet Commonly known as: SYNTHROID Take 88 mcg by mouth daily.   metoprolol succinate 25 MG 24 hr tablet Commonly known as: TOPROL-XL Take 0.5 tablets (12.5 mg total) by mouth daily.   nitroGLYCERIN 0.4 MG SL tablet Commonly known as: NITROSTAT Place 1 tablet (0.4 mg total) under the tongue every 5 (five) minutes x 3 doses as needed for chest pain (if no relief after 3rd dose, call 911 or proceed to the ED for an evaluation).   ondansetron 4 MG tablet Commonly known as: ZOFRAN Take 1 tablet (4 mg total) by mouth every 6 (six) hours as needed for nausea.   pantoprazole 40 MG tablet Commonly known as: PROTONIX Take 40 mg by mouth daily.   potassium chloride 10 MEQ tablet Commonly known as: KLOR-CON M Take 2 tablets (20 mEq total) by mouth daily as needed (only on days she takes lasix.). Replaces: potassium chloride 10 MEQ CR capsule   tamsulosin 0.4 MG Caps capsule Commonly known as: FLOMAX Take 0.4 mg by mouth.   umeclidinium-vilanterol 62.5-25 MCG/INH Aepb Commonly known as: ANORO ELLIPTA Inhale 1 puff into the lungs daily.               Discharge Care Instructions  (From admission, onward)            Start     Ordered   11/28/21 0000  Discharge wound care:       Comments: As directed   11/28/21 1039               Outstanding Labs/Studies   Titrate GDMT  Duration of Discharge Encounter   Greater than 30 minutes including physician time.  Signed, Tami Lin Duke, PA 11/30/2021, 10:29 AM

## 2021-11-28 NOTE — Discharge Summary (Incomplete Revision)
Discharge Summary    Patient ID: TRENAE BRUNKE MRN: [id number]; DOB: [birth year]  Admit date: 11/22/2021 Discharge date: 11/28/2021  PCP:  Caryl Bis, MD   [facility] Providers Cardiologist:  Rozann Lesches, MD   Discharge Diagnoses    Principal Problem:   NSTEMI (non-ST elevated myocardial infarction) [facility]) Active Problems:   Essential hypertension   Hyperlipidemia   Generalized weakness   COPD (chronic obstructive pulmonary disease) (HCC)   Bradycardia   Chest pain with moderate risk for cardiac etiology   ACS (acute coronary syndrome) ([place])   LV (left ventricular) mural thrombus   Hematuria    Diagnostic Studies/Procedures    Left heart cath 11/24/21:   Dist LM to Prox LAD lesion is 100% stenosed.   Ost LM to Dist LM lesion is 25% stenosed.   Dist Cx lesion is 50% stenosed.   Origin to Prox Graft lesion is 99% stenosed.   Previously placed Ost Cx to Prox Cx stent of unknown type is  widely patent.   A drug-eluting stent was successfully placed using a STENT ONYX FRONTIER 3.0X18.   Post intervention, there is a 0% residual stenosis. _____________   Limited Echo 11/27/21:  1. Follow up limited study with Definity shows persistent apical left  ventricular thrombus. The clot now appears fixed, although with a  relatively narrow attachment. Width 7 mm, length 13 mm.. Left ventricular  ejection fraction, by estimation, is 25 to  30%. The left ventricle has severely decreased function.    History of Present Illness     ALEZANDRA EGLI is a 81 y.o. female status post CABG, hypertension, hyperlipidemia who was admitted on 11/22/2021 for non-STEMI.  Underwent left heart catheterization with PCI in the left main into left circumflex.  Course complicated on 06/26/8339 with anterior ST elevation in no wall motion abnormality.  Hospital Course     Consultants: none  CAD s/p CABG, now PCI to left main-LCX Inferoapical LV thrombus - acute Hematuria  Right  groin hematoma resolved, extensive ecchymosis Acute systolic heart failure - new ischemic cardiomyopathy AKI Hypertension - borderline BP this admission Pt presented with NSTEMI this admission. Heart catheterization 11/23/21 with severe native vessel CAD, patent LIMA-LAD and occluded vein grafts. PCI to native left main and native LCX with 4.0 x 25 mm DES. She was placed on ASA and brilinta x 12 months. Echocardiogram with LVEF 25-30% wit LV thrombus.  Eliquis was added to ASA and brilinta. She developed hematuria without clot and stable Hb (actually increased). No bleeding at cath site. Therefore, ASA was stopped and she was continued on brilinta and eliquis 2.5 mg BID. If hematuria persists, may need to transition brilinta to plavix.   Pt developed ST elevation on EKG 11/24/21 without angina and was taken back to the cath lab for relook and found to have subtotal vein graft to diagonal branch occlusion treated with DES. Now with persistent anterior TWI  Course complicated by marginal BP resulting in holding GDMT, and AKI. AKI now resolved, but BP continues to be somewhat soft and labile. BB held due to bradycardia.  GDMT: farxiga Consider PRN lasix  Will attempt to add therapy as tolerated in the OP setting. Home medications that were held: 2.5 bisoprolol daily, 30 mg imdur, 5 mg amlodipine. Plan to add back bisoprolol and possibly imdur vs ARB/ARNI.   Hyperlipidemia with LDL goal < 70 Added zetia to 80 mg lipitor   Pt seen and examined by Dr. Tamala Julian and deemed stable for  discharge. Will arrange cardiology follow up.    Did the patient have an acute coronary syndrome (MI, NSTEMI, STEMI, etc) this admission?:  Yes                               AHA/ACC Clinical Performance & Quality Measures: Aspirin prescribed? - No - triple therapy resulted in hematuria ADP Receptor Inhibitor (Plavix/Clopidogrel, Brilinta/Ticagrelor or Effient/Prasugrel) prescribed (includes medically managed patients)? -  Yes Beta Blocker prescribed? - No - soft pressure High Intensity Statin (Lipitor 40-80mg  or Crestor 20-40mg ) prescribed? - Yes EF assessed during THIS hospitalization? - Yes For EF <40%, was ACEI/ARB prescribed? - No - Reason:  BP For EF <40%, Aldosterone Antagonist (Spironolactone or Eplerenone) prescribed? - No - Reason:  BP Cardiac Rehab Phase II ordered (including medically managed patients)? - Yes       The patient will be scheduled for a TOC follow up appointment in 7-14 days.  A message has been sent to the [facility] and Scheduling Pool at the office where the patient should be seen for follow up.  _____________  Discharge Vitals Blood pressure (!) 142/84, pulse (!) 102, temperature 98.1 F (36.7 C), temperature source Oral, resp. rate 18, height 5\' 2"  (1.575 m), weight 61.4 kg, SpO2 100 %.  Filed Weights   11/25/21 0500 11/27/21 0600 11/27/21 2134  Weight: 59.1 kg 60.4 kg 61.4 kg    Labs & Radiologic Studies    CBC Recent Labs    11/27/21 0101 11/28/21 0544  WBC 9.2 11.7*  HGB 9.1* 10.1*  HCT 28.6* 30.5*  MCV 89.1 86.4  PLT 233 741   Basic Metabolic Panel Recent Labs    11/27/21 0101 11/28/21 0544  NA 140 140  K 3.8 3.3*  CL 109 107  CO2 24 22  GLUCOSE 104* 172*  BUN 27* 11  CREATININE 0.93 0.86  CALCIUM 8.4* 8.7*  MG 2.2  --    Liver Function Tests No results for input(s): "AST", "ALT", "ALKPHOS", "BILITOT", "PROT", "ALBUMIN" in the last 72 hours. No results for input(s): "LIPASE", "AMYLASE" in the last 72 hours. High Sensitivity Troponin:   Recent Labs  Lab 11/22/21 1552 11/22/21 1752 11/24/21 0830  TROPONINIHS 208* 420* >24,000*    BNP Invalid input(s): "POCBNP" D-Dimer No results for input(s): "DDIMER" in the last 72 hours. Hemoglobin A1C No results for input(s): "HGBA1C" in the last 72 hours. Fasting Lipid Panel No results for input(s): "CHOL", "HDL", "LDLCALC", "TRIG", "CHOLHDL", "LDLDIRECT" in the last 72 hours. Thyroid Function  Tests No results for input(s): "TSH", "T4TOTAL", "T3FREE", "THYROIDAB" in the last 72 hours.  Invalid input(s): "FREET3" _____________  ECHOCARDIOGRAM LIMITED  Result Date: 11/27/2021    ECHOCARDIOGRAM LIMITED REPORT   Patient Name:   EARLYN SYLVAN Date of Exam: 11/27/2021 Medical Rec #:  [id number]           Height:       62.0 in Accession #:    [id number]          Weight:       133.2 lb Date of Birth:  May 25, 1940           BSA:          1.608 m Patient Age:    68 years            BP:           125/71 mmHg Patient Gender: F  HR:           75 bpm. Exam Location:  Inpatient Procedure: Limited Echo and Intracardiac Opacification Agent Indications:    CHF  History:        Patient has prior history of Echocardiogram examinations, most                 recent 11/24/2021. CAD and NSTEMI, Prior CABG, COPD; Risk                 Factors:Dyslipidemia and Former Smoker.  Sonographer:    Merrie Roof RDCS Referring Phys: [place]  1. Follow up limited study with Definity shows persistent apical left ventricular thrombus. The clot now appears fixed, although with a relatively narrow attachment. Width 7 mm, length 13 mm.. Left ventricular ejection fraction, by estimation, is 25 to 30%. The left ventricle has severely decreased function. FINDINGS  Left Ventricle: Follow up limited study with Definity shows persistent apical left ventricular thrombus. The clot now appears fixed, although with a relatively narrow attachment. Width 7 mm, length 13 mm. Left ventricular ejection fraction, by estimation, is 25 to 30%. The left ventricle has severely decreased function.  LV Wall Scoring: The apical anterior segment, apical inferior segment, and apex are dyskinetic. The mid anteroseptal segment, apical lateral segment, and mid anterior segment are akinetic. The apical septal segment is hypokinetic. The antero-lateral wall, inferior wall, posterior wall, basal anteroseptal segment, mid  inferoseptal segment, basal anterior segment, and basal inferoseptal segment are normal. Pericardium: There is no evidence of pericardial effusion.  LV Volumes (MOD) LV vol d, MOD A2C: 39.3 ml LV vol d, MOD A4C: 76.5 ml LV vol s, MOD A2C: 27.9 ml LV vol s, MOD A4C: 38.1 ml LV SV MOD A2C:     11.4 ml LV SV MOD A4C:     76.5 ml LV SV MOD BP:      24.3 ml Dani Gobble Croitoru MD Electronically signed by Sanda Klein MD Signature Date/Time: 11/27/2021/12:18:50 PM    Final    VAS [place] GROIN PSEUDOANEURYSM  Result Date: 11/26/2021  ARTERIAL PSEUDOANEURYSM  Patient Name:  ELEANOR GATLIFF  Date of Exam:   11/25/2021 Medical Rec #: [id number]            Accession #:    [id number] Date of Birth: [birth year]            Patient Gender: F Patient Age:   10 years Exam Location:  [facility] Procedure:      VAS [place] GROIN PSEUDOANEURYSM Referring Phys: [place] --------------------------------------------------------------------------------  Exam: Right groin Indications: Patient complains of groin pain and bruising. History: S/p catheterization 11/23/21 and 11/24/21. Comparison Study: No prior study on file Performing Technologist: Sharion Dove RVS  Examination Guidelines: A complete evaluation includes B-mode imaging, spectral Doppler, color Doppler, and power Doppler as needed of all accessible portions of each vessel. Bilateral testing is considered an integral part of a complete examination. Limited examinations for reoccurring indications may be performed as noted.  Summary: No evidence of pseudoaneurysm, AVF or DVT  Diagnosing physician: Monica Martinez MD Electronically signed by Monica Martinez MD on 11/26/2021 at 1:41:57 PM.   --------------------------------------------------------------------------------    Final    CARDIAC CATHETERIZATION  Result Date: 11/24/2021 Images from the original result were not included.   Dist LM to Prox LAD lesion is 100% stenosed.   Ost LM to Dist LM lesion is 25% stenosed.    Dist Cx lesion is 50% stenosed.  Origin to Prox Graft lesion is 99% stenosed.   Previously placed Ost Cx to Prox Cx stent of unknown type is  widely patent.   A drug-eluting stent was successfully placed using a STENT ONYX FRONTIER 3.0X18.   Post intervention, there is a 0% residual stenosis. ADELEI SCOBEY is a 81 y.o. female  [id number] LOCATION:  FACILITY: [facility] PHYSICIAN: Quay Burow, M.D. 26-Jul-1940 DATE OF PROCEDURE:  11/24/2021 DATE OF DISCHARGE: CARDIAC CATHETERIZATION / PCI DES Diag SVG History obtained from chart review.ANNALIAH RIVENBARK is a 81 y.o. female with CAD status post CABG, hypertension, hyperlipidemia who was admitted on 11/22/2021 for non-STEMI.  Underwent left heart catheterization with PCI in the left main into left circumflex.  Course complicated on 09/22/3873 with anterior ST elevation, severe LV dysfunction with an LAD wall motion abnormality.  Because of this Dr. Audie Box  felt that she should have relook coronary angiography.  The patient was however asymptomatic. PROCEDURE DESCRIPTION: The patient was brought to the second floor [facility]iac cath lab in the postabsorptive state.  She was premedicated with IV Solu-Medrol and Benadryl for contrast allergy prophylaxis.  Her right groin was prepped and shaved in usual sterile fashion. Xylocaine 1% was used for local anesthesia. A 5 French sheath was inserted into the right common femoral artery using standard Seldinger technique.  Ultrasound was used to identify the right common femoral artery and guide access.  A digital image was captured and placed the patient's chart.  5 French right left second Silastic catheters were used for selective coronary angiography, selective vein graft and IMA graft angiography and obtain left heart pressures.  Isovue dye was used for the entirety of the case (95 cc of Contrast total to patient).  Retrograde aortic, ventricular and pullback pressures were recorded.  The LVEDP was measured at 31 mmHg.  Her left main into proximal circumflex stent was widely patent.  The LAD was occluded at the origin which was an old finding.  Her LIMA was small in caliber and was somewhat atretic.  The distal LAD was small after the anastomosis.  The nondominant right was not visualized.  The sequential vein to an obtuse marginal branch and PDA was widely patent.  The vein to the diagonal branch was subtotally occluded with a 99% stenosis and TIMI I flow.  This did fill a diagonal branch and backfill the LAD.  I assume that this was the "culprit vessel", and plan is to perform PCI drug-eluting stenting. The patient was already on aspirin and Brilinta.  She received 7000's of heparin with an ACT of 420.  Isovue dye is used for the entirety of the case.  Retroaortic pressures monitored to the case.  I crossed the proximal diagonal vein graft lesion with some difficulty.  I predilated with a 2 mm balloon.  There did appear to be a filling defect beyond the stenosis suggestive of thrombus.  I attempted to place a 4 mm spider distal protection device but this did not pass the proximal lesion and therefore I decided simply to stent and cover the angiographic thrombus.  I placed a 3 mm x 18 mm long Medtronic Onyx frontier drug-eluting stent from the ostium of the vein graft beyond the lesion in the incorporating the filling defect deployed at 14 atm (3.08 mm) resulting in reduction of a 99% proximal diagonal branch SV's G stenosis with TIMI I flow to 0% residual TIMI-3 flow.  There is no residual filling defect nor did there appear to  be distal thromboembolic disease.  The guidewire and catheter were removed.  The sheath was was secured in place.  The patient did receive a bolus of Aggrastat and a brief infusion which was discontinued.   Widely patent left main into the circumflex stent performed yesterday by Dr. Burt Knack.  Atretic appearing LIMA to a small distal LAD.  Patent sequential vein to an OM and PDA with high-grade proximal  SVG to diagonal vein graft stenosis probably representing the "culprit lesion with an excellent angiographic result after PCI drug-eluting stenting.  Hopefully her anterior wall stunned and will come back.  The sheath will be removed once ACT falls below 170 pressure held.  I am going to restart heparin 4 hours after sheath removal for 24 hours.  Given her elevated LVEDP, she probably should be quire some diuresis as well.  She left the lab in stable condition. Quay Burow. MD, [facility] 11/24/2021 12:51 PM    ECHOCARDIOGRAM LIMITED  Result Date: 11/24/2021    ECHOCARDIOGRAM LIMITED REPORT   Patient Name:   MARNITA POIRIER Date of Exam: 11/24/2021 Medical Rec #:  [id number]           Height:       62.5 in Accession #:    [id number]          Weight:       128.3 lb Date of Birth:  03/30/1941           BSA:          1.592 m Patient Age:    44 years            BP:           115/91 mmHg Patient Gender: F                   HR:           73 bpm. Exam Location:  Inpatient Procedure: Limited Echo, Cardiac Doppler, Color Doppler and Intracardiac            Opacification Agent Indications:    NSTEMI I21.4  History:        Patient has prior history of Echocardiogram examinations, most                 recent 10/19/2021. CAD and NSTEMI, Prior CABG, COPD,                 Signs/Symptoms:Chest Pain; Risk Factors:Dyslipidemia and Former                 Smoker. Successful PCI of the native left main and native                 circumflex 11/23/21.  Sonographer:    Darlina Sicilian RDCS Referring Phys: Eleonore Chiquito, Marcello Moores  Sonographer Comments: Severe native vessel coronary artery disease with severe ostial left main stenosis, total occlusion of the LAD, severe stenosis in the distal left main/circumflex junction, severe diffuse stenosis of the distal AV circumflex, and a diffusely diseased nondominant RCA. IMPRESSIONS  1. Suspect small 0.5 x 1 mm mobile thrombus noted at the inferior apex (image 38) by Definity contrast - the enterior  anterior wall and apex are akinetic. Left ventricular ejection fraction, by estimation, is 25 to 30%. The left ventricle has severely decreased function. The left ventricle demonstrates regional wall motion abnormalities (see scoring diagram/findings for description). There is severe akinesis of the left ventricular, entire anterior wall, anteroseptal wall, apical segment and inferoapical segment.  2. Right ventricular systolic function is low normal. The right ventricular size is normal. There is moderately elevated pulmonary artery systolic pressure.  3. The mitral valve is abnormal. Mild to moderate mitral valve regurgitation.  4. The aortic valve is tricuspid. Aortic valve regurgitation is not visualized. Comparison(s): Changes from prior study are noted. 10/19/2021: LVEF 60-65%. Conclusion(s)/Recommendation(s): Critical findings reported to Dr. Audie Box and acknowledged at 11:59 am. FINDINGS  Left Ventricle: Suspect small 0.5 x 1 mm mobile thrombus noted at the inferior apex (image 38) by Definity contrast - the enterior anterior wall and apex are akinetic. Left ventricular ejection fraction, by estimation, is 25 to 30%. The left ventricle has severely decreased function. The left ventricle demonstrates regional wall motion abnormalities. Severe akinesis of the left ventricular, entire anterior wall, anteroseptal wall, apical segment and inferoapical segment. Definity contrast agent was given IV to delineate the left ventricular endocardial borders. Right Ventricle: The right ventricular size is normal. No increase in right ventricular wall thickness. Right ventricular systolic function is low normal. There is moderately elevated pulmonary artery systolic pressure. The tricuspid regurgitant velocity  is 3.06 m/s, and with an assumed right atrial pressure of 8 mmHg, the estimated right ventricular systolic pressure is 09.3 mmHg. Mitral Valve: The mitral valve is abnormal. There is mild calcification of the anterior  and posterior mitral valve leaflet(s). Mild to moderate mitral valve regurgitation. Tricuspid Valve: The tricuspid valve is grossly normal. Tricuspid valve regurgitation is mild. Aortic Valve: The aortic valve is tricuspid. Aortic valve regurgitation is not visualized. Pulmonic Valve: The pulmonic valve was grossly normal. Pulmonic valve regurgitation is trivial.  LV Volumes (MOD) LV vol d, MOD A2C: 89.5 ml LV vol d, MOD A4C: 101.0 ml LV vol s, MOD A2C: 55.4 ml LV vol s, MOD A4C: 68.3 ml LV SV MOD A2C:     34.1 ml LV SV MOD A4C:     101.0 ml LV SV MOD BP:      33.9 ml AORTIC VALVE             PULMONIC VALVE LVOT Vmax:   53.20 cm/s  RVOT Peak grad: 1 mmHg LVOT Vmean:  33.200 cm/s LVOT VTI:    0.081 m TRICUSPID VALVE TR Peak grad:   37.5 mmHg TR Vmax:        306.00 cm/s  SHUNTS Systemic VTI: 0.08 m Pulmonic VTI: 0.053 m Lyman Bishop MD Electronically signed by Lyman Bishop MD Signature Date/Time: 11/24/2021/12:01:54 PM    Final    CARDIAC CATHETERIZATION  Result Date: 11/23/2021 1.  Severe native vessel coronary artery disease with severe ostial left main stenosis, total occlusion of the LAD, severe stenosis in the distal left main/circumflex junction, severe diffuse stenosis of the distal AV circumflex, and a diffusely diseased nondominant RCA 2.  Status post aortocoronary bypass surgery with continued patency of the LIMA to LAD and occlusion of the saphenous vein grafts 3.  Successful PCI of the native left main and native circumflex using a 4.0 x 24 mm Synergy DES Recommendation: Dual antiplatelet therapy with aspirin and ticagrelor at least 12 months, aggressive risk reduction measures, suspect the patient occluded a vein graft causing her acute coronary syndrome.  There were no vein graft targets for PCI.  Her native left main into the circumflex is treated as this large territory is now potentially ischemic after occlusion of the sequential saphenous vein graft to OM and PLA.   DG Chest Port 1  View  Result Date: 11/22/2021 CLINICAL DATA:  Chest pain with weakness. EXAM: PORTABLE CHEST 1 VIEW COMPARISON:  Radiographs 12/31/2018 and 10/17/2021. Abdominal CT 10/17/2021. FINDINGS: 1644 hours. The heart size and mediastinal contours are stable status post median sternotomy. There is aortic atherosclerosis and a small hiatal hernia. Lower lung volumes with mildly increased patchy opacities at both lung bases, likely reflecting atelectasis. No confluent airspace opacity, significant pleural effusion or pneumothorax. No acute osseous findings are evident. Glenohumeral degenerative changes bilaterally and a thoracolumbar scoliosis. Telemetry leads overlie the chest. IMPRESSION: 1. Lower lung volumes with probable resulting mild bibasilar atelectasis. No confluent airspace opacity or significant pleural effusion. 2. Aortic atherosclerosis post median sternotomy. Electronically Signed   By: Richardean Sale M.D.   On: 11/22/2021 16:58    Disposition   Pt is being discharged home today in good condition.  Follow-up Plans & Appointments     Discharge Instructions     Amb Referral to Cardiac Rehabilitation   Complete by: As directed    Diagnosis:  NSTEMI Coronary Stents     After initial evaluation and assessments completed: Virtual Based Care may be provided alone or in conjunction with Phase 2 Cardiac Rehab based on patient barriers.: Yes   Diet - low sodium heart healthy   Complete by: As directed    Discharge instructions   Complete by: As directed    No driving. No lifting over 5 lbs for 1 week. No sexual activity for 1 week. Keep procedure site clean & dry. If you notice increased pain, swelling, bleeding or pus, call/return!  You may shower, but no soaking baths/hot tubs/pools for 1 week.   She has extensive bruising at her groin site and hematuria. Monitor bleeding, collect CBC in 1 week.   Discharge wound care:   Complete by: As directed    As directed   Increase activity slowly    Complete by: As directed        Discharge Medications   Allergies as of 11/28/2021       Reactions   Ivp Dye [iodinated Contrast Media] Swelling   Kidney Dye   Betadine [povidone Iodine] Rash   Codeine Nausea And Vomiting   Other Nausea And Vomiting   Narcotics - nausea,vomiting   Penicillins Rash   Did it involve swelling of the face/tongue/throat, SOB, or low BP? No Did it involve sudden or severe rash/hives, skin peeling, or any reaction on the inside of your mouth or nose? No Did you need to seek medical attention at a hospital or doctor's office? No When did it last happen?       If all above answers are "NO", may proceed with cephalosporin use.   Sulfa Antibiotics Rash        Medication List     STOP taking these medications    amLODipine 5 MG tablet Commonly known as: NORVASC   aspirin 81 MG tablet   bisoprolol 5 MG tablet Commonly known as: ZEBETA   lisinopril 20 MG tablet Commonly known as: ZESTRIL       TAKE these medications    acetaminophen 650 MG CR tablet Commonly known as: TYLENOL Take 650 mg by mouth in the morning.   apixaban 2.5 MG Tabs tablet Commonly known as: ELIQUIS Take 1 tablet (2.5 mg total) by mouth 2 (two) times daily.   atorvastatin 80 MG tablet Commonly known as: LIPITOR Take 1 tablet (80 mg total) by mouth at bedtime.   baclofen 10 MG tablet Commonly known as: LIORESAL Take 10 mg by mouth  daily.   dapagliflozin propanediol 10 MG Tabs tablet Commonly known as: FARXIGA Take 1 tablet (10 mg total) by mouth daily. Start taking on: November 29, 2021   ezetimibe 10 MG tablet Commonly known as: ZETIA Take 1 tablet (10 mg total) by mouth daily. Start taking on: November 29, 2021   famotidine 20 MG tablet Commonly known as: PEPCID Take 40 mg by mouth at bedtime.   furosemide 40 MG tablet Commonly known as: LASIX Take 1 tablet (40 mg total) by mouth daily as needed (for weight gain of 3 lbs overnight or 5 lbs in 1  week.). What changed:  how much to take when to take this reasons to take this Another medication with the same name was removed. Continue taking this medication, and follow the directions you see here.   isosorbide mononitrate 30 MG 24 hr tablet Commonly known as: IMDUR Take 1 tablet (30 mg total) by mouth daily.   levocetirizine 5 MG tablet Commonly known as: XYZAL Take 5 mg by mouth every evening. Equate levocetirizine.   levothyroxine 88 MCG tablet Commonly known as: SYNTHROID Take 88 mcg by mouth daily.   nitroGLYCERIN 0.4 MG SL tablet Commonly known as: NITROSTAT Place 1 tablet (0.4 mg total) under the tongue every 5 (five) minutes x 3 doses as needed for chest pain (if no relief after 3rd dose, call 911 or proceed to the ED for an evaluation).   ondansetron 4 MG tablet Commonly known as: ZOFRAN Take 1 tablet (4 mg total) by mouth every 6 (six) hours as needed for nausea.   pantoprazole 40 MG tablet Commonly known as: PROTONIX Take 40 mg by mouth daily.   potassium chloride 10 MEQ CR capsule Commonly known as: MICRO-K Take 2 capsules (20 mEq total) by mouth daily as needed (only on days she takes lasix.). What changed:  when to take this reasons to take this   tamsulosin 0.4 MG Caps capsule Commonly known as: FLOMAX Take 0.4 mg by mouth.   ticagrelor 90 MG Tabs tablet Commonly known as: BRILINTA Take 1 tablet (90 mg total) by mouth 2 (two) times daily.   umeclidinium-vilanterol 62.5-25 MCG/INH Aepb Commonly known as: ANORO ELLIPTA Inhale 1 puff into the lungs daily.               Discharge Care Instructions  (From admission, onward)           Start     Ordered   11/28/21 0000  Discharge wound care:       Comments: As directed   11/28/21 1039               Outstanding Labs/Studies   Titrate GDMT  Duration of Discharge Encounter   Greater than 30 minutes including physician time.  Signed, Tami Lin Vinson Tietze, PA 11/28/2021,  10:40 AM

## 2021-11-28 NOTE — Progress Notes (Signed)
Mobility Specialist: Progress Note   11/28/21 1623  Mobility  Activity Ambulated with assistance in hallway  Level of Assistance Minimal assist, patient does 75% or more  Assistive Device Front wheel walker  Distance Ambulated (ft) 40 ft  Activity Response Tolerated fair  $Mobility charge 1 Mobility   Pre-Mobility: 106 HR, 153/73 (97) BP, 100% SpO2 During Mobility: 131 HR, 98% SpO2 Post-Mobility: 103 HR, 98% SpO2  Pt received in the bed and agreeable to mobility. To BSC, void successful, and then hallway ambulation. Distance limited secondary to dizziness, VSS. Pt wheeled back to the room as she said she felt she was too tired to continue. Assisted to the chair with call bell and phone in reach. Chair alarm is on.   [facility] [place] Mobility Specialist Mobility Specialist 4 [address]: [phone]

## 2021-11-28 NOTE — Progress Notes (Signed)
Progress Note  Patient Name: Debbie [name] Date of Encounter: 11/28/2021  [facility] Cardiologist: [name], MD   Subjective   Had hematuria over past 24 hours after Purewick removed.  No CP, dyspnea, or groin pain/hematoma.  Inpatient Medications    Scheduled Meds:  apixaban  2.5 mg Oral BID   atorvastatin  80 mg Oral QHS   Chlorhexidine Gluconate Cloth  6 each Topical Daily   cholecalciferol  1,000 Units Oral Daily   dapagliflozin propanediol  10 mg Oral Daily   ezetimibe  10 mg Oral Daily   levothyroxine  88 mcg Oral Daily   montelukast  10 mg Oral QHS   pantoprazole  40 mg Oral Daily   sodium chloride flush  3 mL Intravenous Q12H   sodium chloride flush  3 mL Intravenous Q12H   ticagrelor  90 mg Oral BID   Continuous Infusions:  sodium chloride     sodium chloride     PRN Meds: sodium chloride, sodium chloride, acetaminophen, albuterol, ALPRAZolam, nitroGLYCERIN, ondansetron (ZOFRAN) IV, mouth rinse, sodium chloride flush, sodium chloride flush, zolpidem   Vital Signs    Vitals:   11/27/21 2030 11/27/21 2134 11/28/21 0000 11/28/21 0845  BP: 134/74 (!) 147/91 123/76 (!) 142/84  Pulse: 100 98 100 93  Resp: 19 18 17 18   Temp:  98.6 F (37 C) 98.2 F (36.8 C) 98.1 F (36.7 C)  TempSrc:  Oral Oral Oral  SpO2: 94% 97% 97% 98%  Weight:  61.4 kg    Height:        Intake/Output Summary (Last 24 hours) at 11/28/2021 0846 Last data filed at 11/28/2021 0843 Gross per 24 hour  Intake 560 ml  Output --  Net 560 ml      11/27/2021    9:34 PM 11/27/2021    6:00 AM 11/25/2021    5:00 AM  Last 3 Weights  Weight (lbs) 135 lb 4.8 oz 133 lb 3.2 oz 130 lb 4.7 oz  Weight (kg) 61.372 kg 60.419 kg 59.1 kg      Telemetry    Sinus rhythm- Personally Reviewed  ECG    Performed 11/25/2021 demonstrates anterior precordial deep symmetrical T wave inversion and bradycardia.- Personally Reviewed  Physical Exam  Frail in appearance GEN: No acute distress.    Neck: No JVD Vasculature: Right femoral ecchymosis.  No hematoma. Cardiac: RRR, no murmurs, rubs, or gallops.  Respiratory: Clear to auscultation bilaterally. GI: Soft, nontender, non-distended  MS: No edema; No deformity. Neuro:  Nonfocal  Psych: Normal affect   Labs    High Sensitivity Troponin:   Recent Labs  Lab 11/22/21 1552 11/22/21 1752 11/24/21 0830  TROPONINIHS 208* 420* >24,000*     Chemistry Recent Labs  Lab 11/22/21 2230 11/23/21 0049 11/26/21 0006 11/27/21 0101 11/28/21 0544  NA  --    < > 139 140 140  K  --    < > 3.7 3.8 3.3*  CL  --    < > 105 109 107  CO2  --    < > 23 24 22   GLUCOSE  --    < > 118* 104* 172*  BUN  --    < > 42* 27* 11  CREATININE  --    < > 1.34* 0.93 0.86  CALCIUM  --    < > 8.6* 8.4* 8.7*  MG  --   --   --  2.2  --   PROT 5.6*  --   --   --   --  ALBUMIN 3.2*  --   --   --   --   AST 43*  --   --   --   --   ALT 16  --   --   --   --   ALKPHOS 60  --   --   --   --   BILITOT 0.9  --   --   --   --   GFRNONAA  --    < > 40* >60 >60  ANIONGAP  --    < > 11 7 11    < > = values in this interval not displayed.    Lipids  Recent Labs  Lab 11/23/21 0049  CHOL 209*  TRIG 79  HDL 55  LDLCALC 138*  CHOLHDL 3.8    Hematology Recent Labs  Lab 11/26/21 0006 11/26/21 0547 11/27/21 0101 11/28/21 0544  WBC 11.2*  --  9.2 11.7*  RBC 3.40*  --  3.21* 3.53*  HGB 9.4* 9.5* 9.1* 10.1*  HCT 30.8* 30.6* 28.6* 30.5*  MCV 90.6  --  89.1 86.4  MCH 27.6  --  28.3 28.6  MCHC 30.5  --  31.8 33.1  RDW 15.6*  --  15.2 14.9  PLT 247  --  233 304   Thyroid  Recent Labs  Lab 11/23/21 0049  TSH 7.409*  FREET4 1.08    BNPNo results for input(s): "BNP", "PROBNP" in the last 168 hours.  DDimer No results for input(s): "DDIMER" in the last 168 hours.   Radiology    ECHOCARDIOGRAM LIMITED  Result Date: 11/27/2021    ECHOCARDIOGRAM LIMITED REPORT   Patient Name:   [name] Date of Exam: 11/27/2021 Medical Rec #:   [id number]           Height:       62.0 in Accession #:    [id number]          Weight:       133.2 lb Date of Birth:  08-19-1940           BSA:          1.608 m Patient Age:    81 years            BP:           125/71 mmHg Patient Gender: F                   HR:           75 bpm. Exam Location:  Inpatient Procedure: Limited Echo and Intracardiac Opacification Agent Indications:    CHF  History:        Patient has prior history of Echocardiogram examinations, most                 recent 11/24/2021. CAD and NSTEMI, Prior CABG, COPD; Risk                 Factors:Dyslipidemia and Former Smoker.  Sonographer:    Merrie Roof RDCS Referring Phys: [place]  1. Follow up limited study with Definity shows persistent apical left ventricular thrombus. The clot now appears fixed, although with a relatively narrow attachment. Width 7 mm, length 13 mm.. Left ventricular ejection fraction, by estimation, is 25 to 30%. The left ventricle has severely decreased function. FINDINGS  Left Ventricle: Follow up limited study with Definity shows persistent apical left ventricular thrombus. The clot now appears fixed, although with a relatively narrow  attachment. Width 7 mm, length 13 mm. Left ventricular ejection fraction, by estimation, is 25 to 30%. The left ventricle has severely decreased function.  LV Wall Scoring: The apical anterior segment, apical inferior segment, and apex are dyskinetic. The mid anteroseptal segment, apical lateral segment, and mid anterior segment are akinetic. The apical septal segment is hypokinetic. The antero-lateral wall, inferior wall, posterior wall, basal anteroseptal segment, mid inferoseptal segment, basal anterior segment, and basal inferoseptal segment are normal. Pericardium: There is no evidence of pericardial effusion.  LV Volumes (MOD) LV vol d, MOD A2C: 39.3 ml LV vol d, MOD A4C: 76.5 ml LV vol s, MOD A2C: 27.9 ml LV vol s, MOD A4C: 38.1 ml LV SV MOD A2C:     11.4 ml LV SV MOD  A4C:     76.5 ml LV SV MOD BP:      24.3 ml Rachelle Hora Croitoru MD Electronically signed by Thurmon Fair MD Signature Date/Time: 11/27/2021/12:18:50 PM    Final     Cardiac Studies   CARDIAC CATH PCI 11/24/2021: Diagnostic Dominance: Left  Intervention    Patient Profile     81 y.o. female  status post CABG, hypertension, hyperlipidemia who was admitted on 11/22/2021 for non-STEMI.  Underwent left heart catheterization with PCI in the left main into left circumflex.  Course complicated on 11/24/2021 with anterior ST elevation in no wall motion abnormality.  Assessment & Plan    CAD/CABG presenting with non-ST elevation MI: Persistent anterior T wave inversion.  No recurrent angina. Inferoapical left ventricular thrombus: Still present on echo 11/27/2021. Plan DC aspirin and go with Brilinta and Eliquis.If continued bleeding, change antiplatelet to Plavix. Hematuria: developed in last 24 hours. DC aspirin. Continue Brillinta and Eliquis. If continued bleeding, transition Brilinta to Plavix. Right femoral hematoma: Ecchymosis but no hematoma.   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 Start [place]. Start Beta blocker and ARNI/ARB as OP. Acute kidney injury: resolved. Impaired memory: Not assessed Low blood pressure: improved. Debility: Activate the recommendations of PT at discharge. Hypothyroidism: On synthroid.  Transfer to telemetry.  Physical therapy evaluation.   DC aspirin; Start [place]. Activate PT recommendations at discharge.    For questions or updates, please contact [facility] Please consult [email or web address] for contact info under        Signed, Lesleigh Noe, MD  11/28/2021, 8:46 AM

## 2021-11-29 DIAGNOSIS — J438 Other emphysema: Secondary | ICD-10-CM | POA: Diagnosis not present

## 2021-11-29 DIAGNOSIS — I214 Non-ST elevation (NSTEMI) myocardial infarction: Secondary | ICD-10-CM | POA: Diagnosis not present

## 2021-11-29 DIAGNOSIS — I513 Intracardiac thrombosis, not elsewhere classified: Secondary | ICD-10-CM | POA: Diagnosis not present

## 2021-11-29 DIAGNOSIS — R531 Weakness: Secondary | ICD-10-CM | POA: Diagnosis not present

## 2021-11-29 DIAGNOSIS — R5381 Other malaise: Secondary | ICD-10-CM

## 2021-11-29 LAB — CBC
HCT: 29.5 % — ABNORMAL LOW (ref 36.0–46.0)
Hemoglobin: 9.8 g/dL — ABNORMAL LOW (ref 12.0–15.0)
MCH: 28.6 pg (ref 26.0–34.0)
MCHC: 33.2 g/dL (ref 30.0–36.0)
MCV: 86 fL (ref 80.0–100.0)
Platelets: 375 10*3/uL (ref 150–400)
RBC: 3.43 MIL/uL — ABNORMAL LOW (ref 3.87–5.11)
RDW: 15.6 % — ABNORMAL HIGH (ref 11.5–15.5)
WBC: 15.9 10*3/uL — ABNORMAL HIGH (ref 4.0–10.5)
nRBC: 0 % (ref 0.0–0.2)

## 2021-11-29 LAB — BASIC METABOLIC PANEL
Anion gap: 14 (ref 5–15)
BUN: 14 mg/dL (ref 8–23)
CO2: 21 mmol/L — ABNORMAL LOW (ref 22–32)
Calcium: 9.3 mg/dL (ref 8.9–10.3)
Chloride: 107 mmol/L (ref 98–111)
Creatinine, Ser: 1.02 mg/dL — ABNORMAL HIGH (ref 0.44–1.00)
GFR, Estimated: 56 mL/min — ABNORMAL LOW (ref >60)
Glucose, Bld: 161 mg/dL — ABNORMAL HIGH (ref 70–99)
Potassium: 3.6 mmol/L (ref 3.5–5.1)
Sodium: 142 mmol/L (ref 135–145)

## 2021-11-29 NOTE — Progress Notes (Signed)
Physical Therapy Treatment Patient Details Name: Debbie [name] MRN: [id number] DOB: 1940-09-13 Today's Date: 11/29/2021   History of Present Illness Pt is an 81 y.o. female admitted 11/22/21 with NSTEMI. S/p LHC with PCI in L main to L circumflex. Course complicated on 7/8 with anterior ST elevation in no wall motion abnormality. PMH includes CAD, COPD, HTN, asthma, arthritis, GERD, osteoporosis, anxiety.    PT Comments    Pt received supine and agreeable to session with continued progress towards acute goals. Pt able to come to sitting EOB with min guard for safety and stand pivot to [facility] with min guard for safety and balance. Pt demonstrating ambulation in hall with up to min assist to manage RW and for navigation as pt with tendency for drifting L, pt able to correct with cues, distance limited secondary to fatigue and general weakness. Pt continues to demonstrate mild impulsiveness with mobility but receptive to cues to slow and able to follow commands throughout session. Pt HR tachy up to 144bpm during activity, recovering to low 90s once seated. Pt continues to benefit from skilled PT services to progress toward functional mobility goals.    Recommendations for follow up therapy are one component of a multi-disciplinary discharge planning process, led by the attending physician.  Recommendations may be updated based on patient status, additional functional criteria and insurance authorization.  Follow Up Recommendations  Home health PT     Assistance Recommended at Discharge Intermittent Supervision/Assistance  Patient can return home with the following Assistance with cooking/housework;Assist for transportation;Help with stairs or ramp for entrance   Equipment Recommendations  None recommended by PT    Recommendations for Other Services       Precautions / Restrictions Precautions Precautions: Fall Restrictions Weight Bearing Restrictions: No     Mobility  Bed  Mobility Overal bed mobility: Needs Assistance Bed Mobility: Supine to Sit, Sit to Supine     Supine to sit: Min guard Sit to supine: Modified independent (Device/Increase time)   General bed mobility comments: min guard for safety    Transfers Overall transfer level: Needs assistance Equipment used: Rolling walker (2 wheels) Transfers: Sit to/from Stand, Bed to chair/wheelchair/BSC Sit to Stand: Min guard Stand pivot transfers: Min guard         General transfer comment: min guard for balance and safety    Ambulation/Gait Ambulation/Gait assistance: Min guard, Min assist Gait Distance (Feet): 75 Feet Assistive device: Rolling walker (2 wheels) Gait Pattern/deviations: Step-through pattern, Decreased stride length, Trunk flexed Gait velocity: Decreased     General Gait Details: slow, fatigued gait with min a to mange RW navigating obstacles in hall as pt with tendency to drift L. Cues for upright posture throughout with distance limited secondary to pt stated fatigue. HR tachy up to 144bpm, recovering to 92bpm at rest.   Stairs             Wheelchair Mobility    Modified Rankin (Stroke Patients Only)       Balance Overall balance assessment: Needs assistance   Sitting balance-Leahy Scale: Good     Standing balance support: No upper extremity supported, During functional activity Standing balance-Leahy Scale: Fair Standing balance comment: can stand and take steps without UE support; preference for HHA or RW                            Cognition Arousal/Alertness: Awake/alert Behavior During Therapy: WFL for tasks assessed/performed, Anxious Overall Cognitive  Status: Within Functional Limits for tasks assessed                                 General Comments: some impulsivity with mobility, but receptive to cues and able to follow commands        Exercises      General Comments General comments (skin integrity, edema,  etc.): Pt stating 24/7 assist on d/c to home, education re; activity recomendations and RW use for safety and balance, HR tachy up to 144bpm, recovering to low 90's with rest      Pertinent Vitals/Pain Pain Assessment Pain Assessment: No/denies pain Pain Intervention(s): Monitored during session    Home Living                          Prior Function            PT Goals (current goals can now be found in the care plan section) Acute Rehab PT Goals Patient Stated Goal: return home, interested in HHPT services PT Goal Formulation: With patient Time For Goal Achievement: 12/11/21    Frequency    Min 3X/week      PT Plan      Co-evaluation              AM-PAC PT "6 Clicks" Mobility   Outcome Measure  Help needed turning from your back to your side while in a flat bed without using bedrails?: None Help needed moving from lying on your back to sitting on the side of a flat bed without using bedrails?: A Little Help needed moving to and from a bed to a chair (including a wheelchair)?: A Little Help needed standing up from a chair using your arms (e.g., wheelchair or bedside chair)?: A Little Help needed to walk in hospital room?: A Little Help needed climbing 3-5 steps with a railing? : A Little 6 Click Score: 19    End of Session   Activity Tolerance: Patient tolerated treatment well;Patient limited by fatigue Patient left: in bed;with call bell/phone within reach;with bed alarm set Nurse Communication: Mobility status PT Visit Diagnosis: Other abnormalities of gait and mobility (R26.89);Muscle weakness (generalized) (M62.81)     Time: 8127-5170 PT Time Calculation (min) (ACUTE ONLY): 19 min  Charges:  $Gait Training: 8-22 mins                     Katti Pelle R. PTA Acute Rehabilitation Services Office: [phone]    Catalina Antigua 11/29/2021, 12:30 PM

## 2021-11-29 NOTE — TOC Initial Note (Addendum)
Transition of Care (TOC) - Initial/Assessment Note  Donn Pierini RN, BSN Transitions of Care Unit 4E- RN Case Manager See Treatment Team for direct phone #    Patient Details  Name: Debbie [name] MRN: [id number] Date of Birth: 05/26/1940  Transition of Care [facility]) CM/SW Contact:    Darrold Span, RN Phone Number: 11/29/2021, 3:17 PM  Clinical Narrative:                 Referral received for transition of care needs- [facility] and safe discharge.  CM noted recommendations for [facility]- and spoke with PT that worked with pt today. PT still feels pt is safe to return home w/ HH, with no recommendations for SNF- insurance will not approve a rehab stay.   CM spoke with pt at bedside- per pt she prefers to return home and states she does not want to go a rehab. Pt reporting that she has a neighbor that will be able to assist her and stay with her as well as neighbor's daughter- between the two of them someone will be with pt day and night. Pt is waiting to speak with her daughter about the arrangements and provide her daughter with the neighbor's name and phone contact so daughter may also speak with and confirm the arrangements.- Daughter per patient lives in [place] and comes and checks on her but can not stay with her.   Pt reports she has needed DME- walker, BSC, shower chair, handicap railings. She states she will have transportation home.   Discussed with pt need for having someone with her for safety, as well as HH. List provided Per CMS guidelines from medicare.gov website with star ratings (copy placed in shadow chart), Pt agreeable to [facility] services- explained insurance coverage for [facility] as well as insurance not paying for someone to stay with her. After review of list pt selected [facility] as her first choice, [facility]urb[facility] as a backup.   Call made to [facility] for HHRN/PT referral, awaiting return call.   1530- per Onnie Boer- they are unable to accept due to no staffing in pt's area in  [place].  Call made to [facility]- referral has been accepted for HHRN/PT  Expected Discharge Plan: Home w Home Health Services Barriers to Discharge: Unsafe home situation (pt lives alone)   Patient Goals and CMS Choice Patient states their goals for this hospitalization and ongoing recovery are:: return home CMS Medicare.gov Compare Post Acute Care list provided to:: Patient Choice offered to / list presented to : Patient  Expected Discharge Plan and Services Expected Discharge Plan: Home w Home Health Services   Discharge Planning Services: CM Consult Post Acute Care Choice: Home Health Living arrangements for the past 2 months: Single Family Home Expected Discharge Date: 11/28/21               DME Arranged: N/A DME Agency: NA       HH Arranged: RN, PT, Disease Management HH Agency: [facility] Date HH Agency Contacted: 11/29/21 Time HH Agency Contacted: 1517 Representative spoke with at [facility] Agency: Britt Boozer  Prior Living Arrangements/Services Living arrangements for the past 2 months: Single Family Home Lives with:: Self Patient language and need for interpreter reviewed:: Yes Do you feel safe going back to the place where you live?: Yes      Need for Family Participation in Patient Care: Yes (Comment) Care giver support system in place?: Yes (comment) Current home services: DME (walker, BSC, shower chair) Criminal  Activity/Legal Involvement Pertinent to Current Situation/Hospitalization: No - Comment as needed  Activities of Daily Living Home Assistive Devices/Equipment: Dentures (specify type), Eyeglasses ADL Screening (condition at time of admission) Patient's cognitive ability adequate to safely complete daily activities?: Yes Is the patient deaf or have difficulty hearing?: No Does the patient have difficulty seeing, even when wearing glasses/contacts?: No Does the patient have difficulty concentrating, remembering, or making decisions?: No Patient able to  express need for assistance with ADLs?: Yes Does the patient have difficulty dressing or bathing?: No Independently performs ADLs?: Yes (appropriate for developmental age) Does the patient have difficulty walking or climbing stairs?: No Weakness of Legs: None Weakness of Arms/Hands: None  Permission Sought/Granted Permission sought to share information with : Facility Industrial/product designer granted to share information with : Yes, Verbal Permission Granted  Share Information with NAME: Burna Mortimer  Permission granted to share info w AGENCY: HH  Permission granted to share info w Relationship: Daughter     Emotional Assessment Appearance:: Appears stated age Attitude/Demeanor/Rapport: Engaged Affect (typically observed): Accepting, Appropriate, Pleasant Orientation: : Oriented to Self, Oriented to Place, Oriented to  Time, Oriented to Situation Alcohol / Substance Use: Not Applicable Psych Involvement: No (comment)  Admission diagnosis:  ACS (acute coronary syndrome) (HCC) [I24.9] Chest pain with moderate risk for cardiac etiology [R07.9] NSTEMI (non-ST elevated myocardial infarction) [facility]) [I21.4] Patient Active Problem List   Diagnosis Date Noted   LV (left ventricular) mural thrombus 11/28/2021   Hematuria 11/28/2021   ACS (acute coronary syndrome) [facility])    NSTEMI (non-ST elevated myocardial infarction) (HCC) 11/22/2021   Chest pain with moderate risk for cardiac etiology 11/22/2021   Acute metabolic encephalopathy 10/18/2021   Bradycardia 10/18/2021   Dizziness 10/18/2021   Incomplete bladder emptying 02/07/2021   OAB (overactive bladder) 02/07/2021   COPD (chronic obstructive pulmonary disease) (HCC) 01/02/2019   Edema 12/31/2018   Generalized weakness 03/31/2017   Headache 11/04/2016   Bereavement due to life event 11/04/2016   Dyspnea 01/24/2015   Hypokalemia 09/20/2014   Chest pain 09/20/2014   Carotid artery disease (HCC)    CAD (coronary artery disease)     Essential hypertension    Hypothyroidism    GERD (gastroesophageal reflux disease)    Allergic rhinitis    Anxiety    Hyperlipidemia    Hx of CABG    Ejection fraction    Tricuspid regurgitation    PCP:  Richardean Chimera, MD Pharmacy:   [facility] - [place], [place] - [address] [place] [address] Phone: [phone] Fax: [phone]  [facility] [address] [place] - [address] Doloris Hall [address] [place] [address] Phone: [phone] Fax: [phone]  [facility] [address] [place] [address] Phone: [phone] Fax: [phone]     Social Determinants of Health (SDOH) Interventions    Readmission Risk Interventions    11/29/2021    3:17 PM  Readmission Risk Prevention Plan  Transportation Screening Complete  PCP or Specialist Appt within 5-7 Days Complete  Home Care Screening Complete  Medication Review (RN CM) Complete

## 2021-11-29 NOTE — Progress Notes (Signed)
Progress Note  Patient Name: Debbie [name] Date of Encounter: 11/29/2021  [facility] Cardiologist: Rozann Lesches, MD   Subjective   Awake and alert this morning.  Says that she had a good night.  This is contrary to the notes left in the chart which includes some level of confusion, impulsive activity, inability to complete a walk with mobility specialist yesterday afternoon.  Physical therapy note from 11/27/2021 is reviewed.  They feel it is safe to return home.  Inpatient Medications    Scheduled Meds:  apixaban  2.5 mg Oral BID   atorvastatin  80 mg Oral QHS   cholecalciferol  1,000 Units Oral Daily   dapagliflozin propanediol  10 mg Oral Daily   ezetimibe  10 mg Oral Daily   levothyroxine  88 mcg Oral Daily   montelukast  10 mg Oral QHS   pantoprazole  40 mg Oral Daily   ticagrelor  90 mg Oral BID   Continuous Infusions:  sodium chloride     PRN Meds: sodium chloride, acetaminophen, albuterol, ALPRAZolam, nitroGLYCERIN, ondansetron (ZOFRAN) IV, mouth rinse, zolpidem   Vital Signs    Vitals:   11/29/21 0000 11/29/21 0202 11/29/21 0412 11/29/21 0732  BP: (!) 119/58 106/64 91/77 118/67  Pulse: (!) 123 96 (!) 103 90  Resp: 17 18 18 18   Temp: 98.8 F (37.1 C) 98.7 F (37.1 C) (!) 97.3 F (36.3 C) 98 F (36.7 C)  TempSrc: Oral Oral Oral Oral  SpO2: 100% 97% 98% 93%  Weight:      Height:        Intake/Output Summary (Last 24 hours) at 11/29/2021 0944 Last data filed at 11/29/2021 0438 Gross per 24 hour  Intake 720 ml  Output 300 ml  Net 420 ml      11/27/2021    9:34 PM 11/27/2021    6:00 AM 11/25/2021    5:00 AM  Last 3 Weights  Weight (lbs) 135 lb 4.8 oz 133 lb 3.2 oz 130 lb 4.7 oz  Weight (kg) 61.372 kg 60.419 kg 59.1 kg      Telemetry    Sinus rhythm- Personally Reviewed  ECG    Performed 11/25/2021 demonstrates anterior precordial deep symmetrical T wave inversion and bradycardia.- Personally Reviewed  Physical Exam  Frail in  appearance but bright, and alert. GEN: No distress.  Pale appearing.   Neck: No JVD Vasculature: Right femoral ecchymosis, evolving color. Cardiac: RRR, no murmurs, rubs, or gallops.  Respiratory: Clear to auscultation bilaterally. GI: Soft, nontender, non-distended  MS: No edema; No deformity. Neuro: Confusion last p.m.[name] Psych: Normal affect   Labs    High Sensitivity Troponin:   Recent Labs  Lab 11/22/21 1552 11/22/21 1752 11/24/21 0830  TROPONINIHS 208* 420* >24,000*     Chemistry Recent Labs  Lab 11/22/21 2230 11/23/21 0049 11/27/21 0101 11/28/21 0544 11/29/21 0606  NA  --    < > 140 140 142  K  --    < > 3.8 3.3* 3.6  CL  --    < > 109 107 107  CO2  --    < > 24 22 21*  GLUCOSE  --    < > 104* 172* 161*  BUN  --    < > 27* 11 14  CREATININE  --    < > 0.93 0.86 1.02*  CALCIUM  --    < > 8.4* 8.7* 9.3  MG  --   --  2.2  --   --  PROT 5.6*  --   --   --   --   ALBUMIN 3.2*  --   --   --   --   AST 43*  --   --   --   --   ALT 16  --   --   --   --   ALKPHOS 60  --   --   --   --   BILITOT 0.9  --   --   --   --   GFRNONAA  --    < > >60 >60 56*  ANIONGAP  --    < > 7 11 14    < > = values in this interval not displayed.    Lipids  Recent Labs  Lab 11/23/21 0049  CHOL 209*  TRIG 79  HDL 55  LDLCALC 138*  CHOLHDL 3.8    Hematology Recent Labs  Lab 11/27/21 0101 11/28/21 0544 11/29/21 0606  WBC 9.2 11.7* 15.9*  RBC 3.21* 3.53* 3.43*  HGB 9.1* 10.1* 9.8*  HCT 28.6* 30.5* 29.5*  MCV 89.1 86.4 86.0  MCH 28.3 28.6 28.6  MCHC 31.8 33.1 33.2  RDW 15.2 14.9 15.6*  PLT 233 304 375   Thyroid  Recent Labs  Lab 11/23/21 0049  TSH 7.409*  FREET4 1.08    BNPNo results for input(s): "BNP", "PROBNP" in the last 168 hours.  DDimer No results for input(s): "DDIMER" in the last 168 hours.   Radiology    ECHOCARDIOGRAM LIMITED  Result Date: 11/27/2021    ECHOCARDIOGRAM LIMITED REPORT   Patient Name:   Debbie [name] Date of Exam: 11/27/2021  Medical Rec #:  01/28/2022           Height:       62.0 in Accession #:    [id number]          Weight:       133.2 lb Date of Birth:  November 25, 1940           BSA:          1.608 m Patient Age:    80 years            BP:           125/71 mmHg Patient Gender: F                   HR:           75 bpm. Exam Location:  Inpatient Procedure: Limited Echo and Intracardiac Opacification Agent Indications:    CHF  History:        Patient has prior history of Echocardiogram examinations, most                 recent 11/24/2021. CAD and NSTEMI, Prior CABG, COPD; Risk                 Factors:Dyslipidemia and Former Smoker.  Sonographer:    01/25/2022 RDCS Referring Phys: [phone] [id number] [facility] IMPRESSIONS  1. Follow up limited study with Definity shows persistent apical left ventricular thrombus. The clot now appears fixed, although with a relatively narrow attachment. Width 7 mm, length 13 mm.. Left ventricular ejection fraction, by estimation, is 25 to 30%. The left ventricle has severely decreased function. FINDINGS  Left Ventricle: Follow up limited study with Definity shows persistent apical left ventricular thrombus. The clot now appears fixed, although with a relatively narrow attachment. Width 7 mm, length 13 mm. Left ventricular ejection  fraction, by estimation, is 25 to 30%. The left ventricle has severely decreased function.  LV Wall Scoring: The apical anterior segment, apical inferior segment, and apex are dyskinetic. The mid anteroseptal segment, apical lateral segment, and mid anterior segment are akinetic. The apical septal segment is hypokinetic. The antero-lateral wall, inferior wall, posterior wall, basal anteroseptal segment, mid inferoseptal segment, basal anterior segment, and basal inferoseptal segment are normal. Pericardium: There is no evidence of pericardial effusion.  LV Volumes (MOD) LV vol d, MOD A2C: 39.3 ml LV vol d, MOD A4C: 76.5 ml LV vol s, MOD A2C: 27.9 ml LV vol s, MOD A4C: 38.1 ml LV SV MOD A2C:      11.4 ml LV SV MOD A4C:     76.5 ml LV SV MOD BP:      24.3 ml Rachelle Hora Croitoru MD Electronically signed by Thurmon Fair MD Signature Date/Time: 11/27/2021/12:18:50 PM    Final     Cardiac Studies   CARDIAC CATH PCI 11/24/2021: Diagnostic Dominance: Left  Intervention    Still unable to independently review the images from the most recent PCI.   Patient Profile     81 y.o. female  status post CABG, hypertension, hyperlipidemia who was admitted on 11/22/2021 for non-STEMI.  Underwent left heart catheterization with PCI in the left main into left circumflex.  Course complicated on 11/24/2021 with anterior ST elevation in no wall motion abnormality.  Patient is frail, has some behavioral issues, and is deconditioned.  Concerned about safety at home.  Assessment & Plan    CAD/CABG presenting with non-ST elevation MI: No angina  inferoapical left ventricular thrombus: Still present on echo 11/27/2021. Plan DC aspirin and go with Brilinta and Eliquis.If continued bleeding, change antiplatelet to Plavix. Hematuria: No recurrence. Right femoral hematoma: Ecchymosis but no hematoma--> evolution of color.  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  Add metoprolol succinate 12.5 mg daily. Acute kidney injury: resolved. Impaired memory: Confusion last evening with some belligerent behavior.  Question sundowning Low blood pressure: Borderline Debility: She is not yet able to be discharged because I feel she is unsafe at home alone.  No assistance from family.  Need help from case management to have family conference and determine outcome/decision about placement versus family help once discharged. Hypothyroidism: On synthroid.  Transfer to telemetry.  Physical therapy evaluation.   DC aspirin; Start [place]. Activate PT recommendations at discharge.    For questions or updates, please contact [facility] Please consult [email or web address] for  contact info under        Signed, Lesleigh Noe, MD  11/29/2021, 9:44 AM

## 2021-11-29 NOTE — Progress Notes (Signed)
CARDIAC REHAB PHASE I   Offered to walk with pt. Pt states fatigue after working with PT earlier today. Pt anxious to go home, states a "friend and her daughter" will help take care of her. Stressed importance of safety, pt concerned if she has to go to rehab that she cannot afford to pay for it. Provided support and encouragement. Will continue to follow and ambulate as able.  [id number] Reynold Bowen, RN BSN 11/29/2021 1:34 PM

## 2021-11-30 ENCOUNTER — Inpatient Hospital Stay (HOSPITAL_COMMUNITY): Payer: Medicare HMO

## 2021-11-30 ENCOUNTER — Other Ambulatory Visit (HOSPITAL_COMMUNITY): Payer: Self-pay

## 2021-11-30 ENCOUNTER — Other Ambulatory Visit: Payer: Self-pay | Admitting: Physician Assistant

## 2021-11-30 DIAGNOSIS — I513 Intracardiac thrombosis, not elsewhere classified: Secondary | ICD-10-CM | POA: Diagnosis not present

## 2021-11-30 DIAGNOSIS — R310 Gross hematuria: Secondary | ICD-10-CM

## 2021-11-30 DIAGNOSIS — Z955 Presence of coronary angioplasty implant and graft: Secondary | ICD-10-CM

## 2021-11-30 DIAGNOSIS — J438 Other emphysema: Secondary | ICD-10-CM | POA: Diagnosis not present

## 2021-11-30 DIAGNOSIS — I5043 Acute on chronic combined systolic (congestive) and diastolic (congestive) heart failure: Secondary | ICD-10-CM | POA: Diagnosis not present

## 2021-11-30 DIAGNOSIS — I214 Non-ST elevation (NSTEMI) myocardial infarction: Secondary | ICD-10-CM | POA: Diagnosis not present

## 2021-11-30 LAB — CBC
HCT: 26.9 % — ABNORMAL LOW (ref 36.0–46.0)
Hemoglobin: 8.8 g/dL — ABNORMAL LOW (ref 12.0–15.0)
MCH: 28.2 pg (ref 26.0–34.0)
MCHC: 32.7 g/dL (ref 30.0–36.0)
MCV: 86.2 fL (ref 80.0–100.0)
Platelets: 356 10*3/uL (ref 150–400)
RBC: 3.12 MIL/uL — ABNORMAL LOW (ref 3.87–5.11)
RDW: 15.9 % — ABNORMAL HIGH (ref 11.5–15.5)
WBC: 13.2 10*3/uL — ABNORMAL HIGH (ref 4.0–10.5)
nRBC: 0 % (ref 0.0–0.2)

## 2021-11-30 MED ORDER — CLOPIDOGREL BISULFATE 75 MG PO TABS
75.0000 mg | ORAL_TABLET | Freq: Every day | ORAL | 3 refills | Status: DC
  Filled 2021-11-30: qty 90, 90d supply, fill #0

## 2021-11-30 MED ORDER — METOPROLOL SUCCINATE ER 25 MG PO TB24
12.5000 mg | ORAL_TABLET | Freq: Every day | ORAL | Status: DC
  Administered 2021-12-01 – 2021-12-03 (×3): 12.5 mg via ORAL
  Filled 2021-11-30 (×5): qty 1

## 2021-11-30 MED ORDER — METHYLPREDNISOLONE SODIUM SUCC 40 MG IJ SOLR
40.0000 mg | Freq: Once | INTRAMUSCULAR | Status: AC
  Administered 2021-11-30: 40 mg via INTRAVENOUS
  Filled 2021-11-30: qty 1

## 2021-11-30 MED ORDER — DIPHENHYDRAMINE HCL 50 MG/ML IJ SOLN: 50.0000 mg | Freq: Once | INTRAMUSCULAR | Status: AC

## 2021-11-30 MED ORDER — CLOPIDOGREL BISULFATE 75 MG PO TABS
75.0000 mg | ORAL_TABLET | Freq: Every day | ORAL | Status: DC
  Administered 2021-12-01 – 2021-12-03 (×3): 75 mg via ORAL
  Filled 2021-11-30 (×3): qty 1

## 2021-11-30 MED ORDER — CLOPIDOGREL BISULFATE 75 MG PO TABS
300.0000 mg | ORAL_TABLET | Freq: Once | ORAL | Status: AC
  Administered 2021-11-30: 300 mg via ORAL
  Filled 2021-11-30: qty 4

## 2021-11-30 MED ORDER — DIPHENHYDRAMINE HCL 25 MG PO CAPS
50.0000 mg | ORAL_CAPSULE | Freq: Once | ORAL | Status: AC
Start: 2021-11-30 — End: 2021-11-30
  Administered 2021-11-30: 50 mg via ORAL
  Filled 2021-11-30: qty 2

## 2021-11-30 MED ORDER — METOPROLOL SUCCINATE ER 25 MG PO TB24
12.5000 mg | ORAL_TABLET | Freq: Every day | ORAL | 3 refills | Status: DC
  Filled 2021-11-30: qty 15, 30d supply, fill #0

## 2021-11-30 MED ORDER — IOHEXOL 300 MG/ML  SOLN
100.0000 mL | Freq: Once | INTRAMUSCULAR | Status: AC | PRN
  Administered 2021-11-30: 100 mL via INTRAVENOUS

## 2021-11-30 MED ORDER — GENTAMICIN SULFATE 40 MG/ML IJ SOLN
5.0000 mg/kg | INTRAVENOUS | Status: DC
  Administered 2021-11-30 – 2021-12-02 (×2): 310 mg via INTRAVENOUS
  Filled 2021-11-30 (×2): qty 7.75

## 2021-11-30 NOTE — Consult Note (Signed)
Reason for Consult: Gross Hematuria / Emphysematous Cystitis  Referring Physician: Zoila Shutter MD  Debbie [name] is an 81 y.o. female.   HPI:   1 - Gross Hematuria / Emphysematous Cystitis - new gross hematuria 11/29/21 during hospitalization for NSTEMI and PCI. On ASA + eliquus + plavix. Hematuria CT w/o upper tract lesions but impressive emphysmatous cystitis. She has been on Farxiga (risk factor). WBC 16k, no fevers / tachycardia. UCX 7/14 pending. Placed on empirig gent.  PMH sig for severe CAD/CHF/Lasix, dementia, XXXX. Her PCP is XXX.  Today "Debbie [name]" is seen in consultation for above.   Past Medical History:  Diagnosis Date   Allergic rhinitis    Anxiety    Arthritis    Asthma    Bladder spasms    CAD (coronary artery disease)    CABG 2006 [facility]   Carotid artery disease (HCC)    Collagen vascular disease (HCC)    COPD (chronic obstructive pulmonary disease) (HCC)    Cystocele    Essential hypertension    GERD (gastroesophageal reflux disease)    Hyperlipidemia    Hypothyroidism    Osteoporosis    Renal insufficiency    Vaginal vault prolapse     Past Surgical History:  Procedure Laterality Date   ANTERIOR AND POSTERIOR REPAIR N/A 04/06/2013   Procedure: CYSTOCELE REPAIR ;  Surgeon: Martina Sinner, MD;  Location: [facility] ORS;  Service: Urology;  Laterality: N/A;   CATARACTS     REMOVED   COLONOSCOPY     CORNEA LACERATION REPAIR     CORONARY ARTERY BYPASS GRAFT     X 5 VESSELS   CORONARY STENT INTERVENTION N/A 11/23/2021   Procedure: CORONARY STENT INTERVENTION;  Surgeon: Tonny Bollman, MD;  Location: [facility] INVASIVE CV LAB;  Service: Cardiovascular;  Laterality: N/A;   CORONARY/GRAFT ACUTE MI REVASCULARIZATION N/A 11/24/2021   Procedure: Coronary/Graft Acute MI Revascularization;  Surgeon: Runell Gess, MD;  Location: [facility] INVASIVE CV LAB;  Service: Cardiovascular;  Laterality: N/A;   CYSTOSCOPY N/A 04/06/2013   Procedure: CYSTOSCOPY;  Surgeon:  Martina Sinner, MD;  Location: [facility] ORS;  Service: Urology;  Laterality: N/A;   HERNIA REPAIR     LEFT HEART CATH AND CORONARY ANGIOGRAPHY N/A 11/24/2021   Procedure: LEFT HEART CATH AND CORONARY ANGIOGRAPHY;  Surgeon: Runell Gess, MD;  Location: [facility] INVASIVE CV LAB;  Service: Cardiovascular;  Laterality: N/A;   LEFT HEART CATH AND CORS/GRAFTS ANGIOGRAPHY N/A 11/23/2021   Procedure: LEFT HEART CATH AND CORS/GRAFTS ANGIOGRAPHY;  Surgeon: Tonny Bollman, MD;  Location: [facility] INVASIVE CV LAB;  Service: Cardiovascular;  Laterality: N/A;   SEPTOPLASTY N/A 04/28/2015   Procedure: SEPTOPLASTY;  Surgeon: Melvenia Beam, MD;  Location: [facility] OR;  Service: ENT;  Laterality: N/A;   SINUS ENDO W/FUSION Bilateral 04/28/2015   Procedure: ENDOSCOPIC SINUS SURGERY WITH NAVIGATION;  Surgeon: Melvenia Beam, MD;  Location: [facility] OR;  Service: ENT;  Laterality: Bilateral;   TUBAL LIGATION      Family History  Problem Relation Age of Onset   Cirrhosis Father    Lung disease Father    Diabetes Mellitus II Mother    Heart Problems Mother     Social History:  reports that she has never smoked. She has never used smokeless tobacco. She reports current alcohol use. She reports that she does not use drugs.  Allergies:  Allergies  Allergen Reactions   Ivp Dye [Iodinated Contrast Media] Swelling    Kidney Dye   Betadine [Povidone Iodine]  Rash   Codeine Nausea And Vomiting   Other Nausea And Vomiting    Narcotics - nausea,vomiting   Penicillins Rash    Did it involve swelling of the face/tongue/throat, SOB, or low BP? No Did it involve sudden or severe rash/hives, skin peeling, or any reaction on the inside of your mouth or nose? No Did you need to seek medical attention at a hospital or doctor's office? No When did it last happen?       If all above answers are "NO", may proceed with cephalosporin use.    Sulfa Antibiotics Rash    Medications: {medication reviewed/display:[id number]}  Results for orders placed  or performed during the hospital encounter of 11/22/21 (from the past 48 hour(s))  CBC     Status: Abnormal   Collection Time: 11/29/21  6:06 AM  Result Value Ref Range   WBC 15.9 (H) 4.0 - 10.5 K/uL   RBC 3.43 (L) 3.87 - 5.11 MIL/uL   Hemoglobin 9.8 (L) 12.0 - 15.0 g/dL   HCT 54.5 (L) 62.5 - 63.8 %   MCV 86.0 80.0 - 100.0 fL   MCH 28.6 26.0 - 34.0 pg   MCHC 33.2 30.0 - 36.0 g/dL   RDW 93.7 (H) 34.2 - 87.6 %   Platelets 375 150 - 400 K/uL   nRBC 0.0 0.0 - 0.2 %    Comment: Performed at [facility], [address] [place], [place] [address]  Basic metabolic panel     Status: Abnormal   Collection Time: 11/29/21  6:06 AM  Result Value Ref Range   Sodium 142 135 - 145 mmol/L   Potassium 3.6 3.5 - 5.1 mmol/L   Chloride 107 98 - 111 mmol/L   CO2 21 (L) 22 - 32 mmol/L   Glucose, Bld 161 (H) 70 - 99 mg/dL    Comment: Glucose reference range applies only to samples taken after fasting for at least 8 hours.   BUN 14 8 - 23 mg/dL   Creatinine, Ser 2.62 (H) 0.44 - 1.00 mg/dL   Calcium 9.3 8.9 - 03.5 mg/dL   GFR, Estimated 56 (L) >60 mL/min    Comment: (NOTE) Calculated using the CKD-EPI Creatinine Equation (2021)    Anion gap 14 5 - 15    Comment: Performed at [facility], [address], [place], [place] [address]  CBC     Status: Abnormal   Collection Time: 11/30/21  3:03 AM  Result Value Ref Range   WBC 13.2 (H) 4.0 - 10.5 K/uL   RBC 3.12 (L) 3.87 - 5.11 MIL/uL   Hemoglobin 8.8 (L) 12.0 - 15.0 g/dL   HCT 63.8 (L) 45.3 - 64.6 %   MCV 86.2 80.0 - 100.0 fL   MCH 28.2 26.0 - 34.0 pg   MCHC 32.7 30.0 - 36.0 g/dL   RDW 80.3 (H) 21.2 - 24.8 %   Platelets 356 150 - 400 K/uL   nRBC 0.0 0.0 - 0.2 %    Comment: Performed at [facility], [address], [place], [place] [address]    CT ABDOMEN PELVIS W WO CONTRAST  Result Date: 11/30/2021 CLINICAL DATA:  Gross hematuria. EXAM: CT ABDOMEN AND PELVIS WITHOUT AND WITH CONTRAST TECHNIQUE: Multidetector CT imaging of  the abdomen and pelvis was performed following the standard protocol before and following the bolus administration of intravenous contrast. RADIATION DOSE REDUCTION: This exam was performed according to the departmental dose-optimization program which includes automated exposure control, adjustment  of the mA and/or kV according to patient size and/or use of iterative reconstruction technique. CONTRAST:  OMNIPAQUE IOHEXOL 300 MG/ML  SOLN COMPARISON:  Noncontrast CT on 10/17/2021 FINDINGS: Lower Chest: Small left pleural effusion and tiny right pleural effusion are new since prior exam. Hepatobiliary: No hepatic masses identified. Gallstones are seen, however there is no evidence of cholecystitis or biliary dilatation. Pancreas:  No mass or inflammatory changes. Spleen: Within normal limits in size and appearance. Adrenals/Urinary Tract: No masses identified. Small benign-appearing right renal cyst again noted (no followup imaging recommended). No evidence of ureteral calculi or hydronephrosis. The urinary bladder is distended and gas is seen throughout the bladder wall, consistent with emphysematous cystitis. Air in the bladder lumen is likely from recent instrumentation. Stomach/Bowel: Small hiatal hernia is again seen. No evidence of obstruction, inflammatory process or abnormal fluid collections. Diverticulosis is seen mainly involving the sigmoid colon, however there is no evidence of diverticulitis. Vascular/Lymphatic: No pathologically enlarged lymph nodes. No acute vascular findings. Aortic atherosclerotic calcification incidentally noted. Reproductive: Prior hysterectomy noted. Adnexal regions are unremarkable in appearance. Other: A small midline epigastric ventral hernia is again seen, which contains only fat. Musculoskeletal:  No suspicious bone lesions identified. IMPRESSION: Acute emphysematous cystitis. No evidence of urinary tract neoplasm, ureteral calculi, or hydronephrosis. Cholelithiasis. No  radiographic evidence of cholecystitis. Colonic diverticulosis, without radiographic evidence of diverticulitis. Stable small hiatal hernia and epigastric ventral hernia. New small left and tiny right pleural effusions. Aortic Atherosclerosis (ICD10-I70.0) and Emphysema (ICD10-J43.9). These results will be called to the ordering clinician or representative by the Radiologist Assistant, and communication documented in the PACS or [employer]. Electronically Signed   By: Danae Orleans M.D.   On: 11/30/2021 19:10    Review of Systems Blood pressure (!) 89/51, pulse 89, temperature 98.1 F (36.7 C), temperature source Oral, resp. rate 14, height 5\' 2"  (1.575 m), weight 61.3 kg, SpO2 98 %. Physical Exam  Assessment/Plan:  1 - Gross Hematuria / Emphysematous Cystitis - impressive on imaging, but fortunatley no sig Hgb drop or clots. This will resolve with likely ABX and HOLD FARXIGA now and at discharge as this is really her only risk factor. UCX, A1c ordred. No indiatio for procedural intervention. Do not favor catheter as would likely worsen hematuria by additional mucosal irritation.  Most prudent to remain in house until UCX resulted / WBC declines.   11/30/2021, 8:47 PM

## 2021-11-30 NOTE — Progress Notes (Signed)
CARDIAC REHAB PHASE I   Offered to walk with pt. Pt declines as she is making arrangements to get home. Stressed importance of medication compliance and safety with pt. Reviewed restrictions, site care, and exercise guidelines. MI book, stent card, and other materials at bedside. Pt in recliner, phone and bedside table within reach. Referred to [facility] [place].  [id number] Reynold Bowen, RN BSN 11/30/2021 9:15 AM

## 2021-11-30 NOTE — Progress Notes (Signed)
Spoke with Dr. Berneice Heinrich with urology regarding CT results. He advised foley and gentamycin per pharmacy. Urology will see tomorrow.

## 2021-11-30 NOTE — TOC Transition Note (Signed)
Transition of Care (TOC) - CM/SW Discharge Note Donn Pierini RN, BSN Transitions of Care Unit 4E- RN Case Manager See Treatment Team for direct phone #    Patient Details  Name: Debbie [name] MRN: [id number] Date of Birth: 21-Jun-1940  Transition of Care [facility]) CM/SW Contact:  Darrold Span, RN Phone Number: 11/30/2021, 10:12 AM   Clinical Narrative:    Pt stable for transition home today, HHRN/PT has been arranged with [facility]- they will contact pt within 48hr post discharge to schedule start of care visit.   Pt has arranged assistance at home w/ neighbor.   No further TOC needs noted  Meds from [facility] are in being stored and will need to be pickup by staff - bedside RN aware and will make sure pt has meds prior to discharge.    Final next level of care: Home w Home Health Services Barriers to Discharge: Barriers Resolved   Patient Goals and CMS Choice Patient states their goals for this hospitalization and ongoing recovery are:: return home CMS Medicare.gov Compare Post Acute Care list provided to:: Patient Choice offered to / list presented to : Patient  Discharge Placement               Home w/ [facility]        Discharge Plan and Services   Discharge Planning Services: CM Consult Post Acute Care Choice: Home Health          DME Arranged: N/A DME Agency: NA       HH Arranged: RN, PT, Disease Management HH Agency: [facility] Date HH Agency Contacted: 11/29/21 Time HH Agency Contacted: 1517 Representative spoke with at [facility] Agency: Britt Boozer  Social Determinants of Health (SDOH) Interventions     Readmission Risk Interventions    11/29/2021    3:17 PM  Readmission Risk Prevention Plan  Transportation Screening Complete  PCP or Specialist Appt within 5-7 Days Complete  Home Care Screening Complete  Medication Review (RN CM) Complete

## 2021-11-30 NOTE — Progress Notes (Signed)
Pharmacy Antibiotic Note  Debbie MCCULLEY is a 82 y.o. female admitted on 11/22/2021 with  acute emphysematous cystitis .  Pharmacy has been consulted for gentamicin dosing.  Patient underwent PCI to LM into Lcx on 11/23/2021 - has been hematuria. Went to CT finding acute emphysematous cystitis- gent recommend by urology. WBC 13.2, Scr 1.02 (CrCl 37.9 mL/min).  Plan: Gentamicin 5 mg/kg every 48 hours  Order single level 10 hours after first dose to confirm dosing interval Monitor cx results, clinical pic, and levels as appropriate  Height: 5\' 2"  (157.5 cm) Weight: 61.3 kg (135 lb 2.3 oz) IBW/kg (Calculated) : 50.1  Temp (24hrs), Avg:98.1 F (36.7 C), Min:97.8 F (36.6 C), Max:98.3 F (36.8 C)  Recent Labs  Lab 11/25/21 0017 11/25/21 0731 11/26/21 0006 11/27/21 0101 11/28/21 0544 11/29/21 0606 11/30/21 0303  WBC 15.8*   < > 11.2* 9.2 11.7* 15.9* 13.2*  CREATININE 1.70*  --  1.34* 0.93 0.86 1.02*  --    < > = values in this interval not displayed.    Estimated Creatinine Clearance: 37.9 mL/min (A) (by C-G formula based on SCr of 1.02 mg/dL (H)).    Allergies  Allergen Reactions   Ivp Dye [Iodinated Contrast Media] Swelling    Kidney Dye   Betadine [Povidone Iodine] Rash   Codeine Nausea And Vomiting   Other Nausea And Vomiting    Narcotics - nausea,vomiting   Penicillins Rash    Did it involve swelling of the face/tongue/throat, SOB, or low BP? No Did it involve sudden or severe rash/hives, skin peeling, or any reaction on the inside of your mouth or nose? No Did you need to seek medical attention at a hospital or doctor's office? No When did it last happen?       If all above answers are "NO", may proceed with cephalosporin use.    Sulfa Antibiotics Rash    Antimicrobials this admission: Gentamicin 7/14 >>   Dose adjustments this admission: N/A  Microbiology results: 11/23/2021 MRSA PCR: neg  Thank you for allowing pharmacy to be a part of this patient's  care.  01/24/2022, PharmD, BCCCP Clinical Pharmacist  Phone: [phone] 11/30/2021 7:52 PM  Please check AMION for all [facility] Pharmacy phone numbers After 10:00 PM, call Main Pharmacy [phone]

## 2021-11-30 NOTE — Progress Notes (Signed)
PT Cancellation Note  Patient Details Name: Debbie [name] MRN: [id number] DOB: 06-13-1940   Cancelled Treatment:    Reason Eval/Treat Not Completed: Patient declined, no reason specified;Other (comment), pt declining all mobility stating she walked with MT earlier and "had no strength left" despite encouragement and education on benefits of mobility. Will check back as schedule allows to continue with PT POC.  Lenora Boys. PTA Acute Rehabilitation Services Office: [phone]    Catalina Antigua 11/30/2021, 1:48 PM

## 2021-11-30 NOTE — Care Plan (Signed)
Micah Flesher PA ordered CT AP W WO. Pt getting pre med for iodine allergy. PA made aware 13 hour pre med prep is done for house pts but due to emergent situation PA prefers 4 hour pre med prep plan. RN made aware. Pt to be brought down at 1630

## 2021-11-30 NOTE — Care Management Important Message (Signed)
Important Message  Patient Details  Name: Debbie [name] MRN: [id number] Date of Birth: 04-Jun-1940   Medicare Important Message Given:  Yes     Renie Ora 11/30/2021, 9:19 AM

## 2021-11-30 NOTE — Progress Notes (Addendum)
Plan discharge today. CHF therapy Metoprolol succinate and Farxiga. Add MRA and Entresto as OP. Home health as recommended and assistance from friend at home. Upon speaking with her this morning, she is having intermittent hematuria.  This is likely related to the bleeding potential of both Brilinta and Eliquis in this elderly lady.  We will discontinue Brilinta.  Aspirin has already been discontinued.  Loaded with 300 mg of Plavix.  Then 75 mg of Plavix daily . This will have less bleeding risk combined with Eliquis.  Will get IP GU consult. Probably not looking good for DC today. Need Primary cardiology f/u in 7-10 days.

## 2021-11-30 NOTE — Progress Notes (Signed)
Mobility Specialist: Progress Note   11/30/21 1237  Mobility  Activity Ambulated with assistance in hallway  Level of Assistance Contact guard assist, steadying assist  Assistive Device Front wheel walker  Distance Ambulated (ft) 90 ft  Activity Response Tolerated well  $Mobility charge 1 Mobility   Pre-Mobility: 88 HR, 110/63 (77) BP, 100% SpO2 Post-Mobility: 84 HR, 104/54 (71) BP, 100% SpO2  Pt received in the bed and agreeable to mobility. To [facility] and then agreeable to hallway ambulation. Urinary incontinent. C/o general fatigue during ambulation, no c/o pain or dizziness. Pt back to bed after session with call bell and phone at her side. Bed alarm is on.   [facility] Aariana Shankland Mobility Specialist Mobility Specialist [address]: [phone]

## 2021-12-01 ENCOUNTER — Encounter (HOSPITAL_COMMUNITY): Payer: Self-pay | Admitting: Urology

## 2021-12-01 DIAGNOSIS — I214 Non-ST elevation (NSTEMI) myocardial infarction: Secondary | ICD-10-CM | POA: Diagnosis not present

## 2021-12-01 LAB — CBC
HCT: 25.2 % — ABNORMAL LOW (ref 36.0–46.0)
Hemoglobin: 8.1 g/dL — ABNORMAL LOW (ref 12.0–15.0)
MCH: 28.2 pg (ref 26.0–34.0)
MCHC: 32.1 g/dL (ref 30.0–36.0)
MCV: 87.8 fL (ref 80.0–100.0)
Platelets: 371 10*3/uL (ref 150–400)
RBC: 2.87 MIL/uL — ABNORMAL LOW (ref 3.87–5.11)
RDW: 15.7 % — ABNORMAL HIGH (ref 11.5–15.5)
WBC: 8.4 10*3/uL (ref 4.0–10.5)
nRBC: 0 % (ref 0.0–0.2)

## 2021-12-01 LAB — HEMOGLOBIN A1C
Hgb A1c MFr Bld: 6.4 % — ABNORMAL HIGH (ref 4.8–5.6)
Mean Plasma Glucose: 136.98 mg/dL

## 2021-12-01 LAB — GENTAMICIN LEVEL, RANDOM: Gentamicin Rm: 6.2 ug/mL

## 2021-12-01 MED ORDER — WHITE PETROLATUM EX OINT
TOPICAL_OINTMENT | CUTANEOUS | Status: DC | PRN
  Filled 2021-12-01: qty 28.35

## 2021-12-01 MED ORDER — CHLORHEXIDINE GLUCONATE CLOTH 2 % EX PADS
6.0000 | MEDICATED_PAD | Freq: Every day | CUTANEOUS | Status: DC
  Administered 2021-12-01 – 2021-12-02 (×2): 6 via TOPICAL

## 2021-12-01 NOTE — Progress Notes (Signed)
Pharmacy Antibiotic Note  Debbie [name] is a 81 y.o. female admitted on 11/22/2021 with  acute emphysematous cystitis .  Pharmacy has been consulted for gentamicin dosing.  Patient underwent PCI to LM into Lcx on 11/23/2021 - has been hematuria. Went to CT finding acute emphysematous cystitis- gent recommend by urology. WBC 8.4, Scr 1.02 (CrCl 38.3 mL/min). Gent random level ordered 10hrs after initial dose with level of 6.2. Based on dosing nomogram, dose may remain at 5mg /kg every 48 hours. Scr increased 0.86>>1.02 since gent initiation 7/14.   Plan: Continue gentamicin 5 mg/kg every 48 hours  Order random level 7/17  Monitor cx results, clinical pic, and levels as appropriate  Height: 5\' 2"  (157.5 cm) Weight: 62.8 kg (138 lb 7.2 oz) IBW/kg (Calculated) : 50.1  Temp (24hrs), Avg:98.2 F (36.8 C), Min:98.1 F (36.7 C), Max:98.3 F (36.8 C)  Recent Labs  Lab 11/25/21 0017 11/25/21 0731 11/26/21 0006 11/27/21 0101 11/28/21 0544 11/29/21 0606 11/30/21 0303 12/01/21 0047 12/01/21 0721  WBC 15.8*   < > 11.2* 9.2 11.7* 15.9* 13.2* 8.4  --   CREATININE 1.70*  --  1.34* 0.93 0.86 1.02*  --   --   --   GENTRANDOM  --   --   --   --   --   --   --   --  6.2   < > = values in this interval not displayed.     Estimated Creatinine Clearance: 38.3 mL/min (A) (by C-G formula based on SCr of 1.02 mg/dL (H)).    Allergies  Allergen Reactions   Ivp Dye [Iodinated Contrast Media] Swelling    Kidney Dye   Betadine [Povidone Iodine] Rash   Codeine Nausea And Vomiting   Other Nausea And Vomiting    Narcotics - nausea,vomiting   Penicillins Rash    Did it involve swelling of the face/tongue/throat, SOB, or low BP? No Did it involve sudden or severe rash/hives, skin peeling, or any reaction on the inside of your mouth or nose? No Did you need to seek medical attention at a hospital or doctor's office? No When did it last happen?       If all above answers are "NO", may proceed with  cephalosporin use.    Sulfa Antibiotics Rash    Antimicrobials this admission: Gentamicin 7/14 >>   Dose adjustments this admission: N/A   Microbiology results: 11/23/2021 MRSA PCR: neg 7/14 urine cx: pending    01/24/2022, PharmD PGY1 Pharmacy Resident  7/15/202312:11 PM

## 2021-12-01 NOTE — Progress Notes (Addendum)
Progress Note  Patient Name: Debbie [name] Date of Encounter: 12/01/2021  [facility] Cardiologist: [name], MD   Subjective   No chest pain  Inpatient Medications    Scheduled Meds:  apixaban  2.5 mg Oral BID   atorvastatin  80 mg Oral QHS   Chlorhexidine Gluconate Cloth  6 each Topical Daily   cholecalciferol  1,000 Units Oral Daily   clopidogrel  75 mg Oral Daily   ezetimibe  10 mg Oral Daily   levothyroxine  88 mcg Oral Daily   metoprolol succinate  12.5 mg Oral Daily   montelukast  10 mg Oral QHS   pantoprazole  40 mg Oral Daily   Continuous Infusions:  sodium chloride 250 mL (11/30/21 2029)   gentamicin Stopped (11/30/21 2130)   PRN Meds: sodium chloride, acetaminophen, albuterol, ALPRAZolam, nitroGLYCERIN, ondansetron (ZOFRAN) IV, mouth rinse, white petrolatum, zolpidem   Vital Signs    Vitals:   11/30/21 2326 12/01/21 0614 12/01/21 0750 12/01/21 1038  BP: 93/61 (!) 97/58 118/65 (!) 106/57  Pulse: 78 76 85 77  Resp: 16 20    Temp: 98.3 F (36.8 C) 98.3 F (36.8 C) 98.2 F (36.8 C)   TempSrc: Axillary Axillary Oral   SpO2: 99% 100% 100%   Weight:  62.8 kg    Height:        Intake/Output Summary (Last 24 hours) at 12/01/2021 1058 Last data filed at 12/01/2021 0930 Gross per 24 hour  Intake 468 ml  Output 1225 ml  Net -757 ml      12/01/2021    6:14 AM 11/30/2021    4:48 AM 11/27/2021    9:34 PM  Last 3 Weights  Weight (lbs) 138 lb 7.2 oz 135 lb 2.3 oz 135 lb 4.8 oz  Weight (kg) 62.8 kg 61.3 kg 61.372 kg      Telemetry    SR - Personally Reviewed  ECG    N/a - Personally Reviewed  Physical Exam   GEN: No acute distress.   Neck: No JVD Cardiac: RRR, no murmurs, rubs, or gallops.  Respiratory: Clear to auscultation bilaterally. GI: Soft, nontender, non-distended  MS: No edema; No deformity. Neuro:  Nonfocal  Psych: Normal affect   Labs    High Sensitivity Troponin:   Recent Labs  Lab 11/22/21 1552  11/22/21 1752 11/24/21 0830  TROPONINIHS 208* 420* >24,000*     Chemistry Recent Labs  Lab 11/27/21 0101 11/28/21 0544 11/29/21 0606  NA 140 140 142  K 3.8 3.3* 3.6  CL 109 107 107  CO2 24 22 21*  GLUCOSE 104* 172* 161*  BUN 27* 11 14  CREATININE 0.93 0.86 1.02*  CALCIUM 8.4* 8.7* 9.3  MG 2.2  --   --   GFRNONAA >60 >60 56*  ANIONGAP 7 11 14     Lipids No results for input(s): "CHOL", "TRIG", "HDL", "LABVLDL", "LDLCALC", "CHOLHDL" in the last 168 hours.  Hematology Recent Labs  Lab 11/29/21 0606 11/30/21 0303 12/01/21 0047  WBC 15.9* 13.2* 8.4  RBC 3.43* 3.12* 2.87*  HGB 9.8* 8.8* 8.1*  HCT 29.5* 26.9* 25.2*  MCV 86.0 86.2 87.8  MCH 28.6 28.2 28.2  MCHC 33.2 32.7 32.1  RDW 15.6* 15.9* 15.7*  PLT 375 356 371   Thyroid No results for input(s): "TSH", "FREET4" in the last 168 hours.  BNPNo results for input(s): "BNP", "PROBNP" in the last 168 hours.  DDimer No results for input(s): "DDIMER" in the last 168 hours.   Radiology  CT ABDOMEN PELVIS W WO CONTRAST  Result Date: 11/30/2021 CLINICAL DATA:  Gross hematuria. EXAM: CT ABDOMEN AND PELVIS WITHOUT AND WITH CONTRAST TECHNIQUE: Multidetector CT imaging of the abdomen and pelvis was performed following the standard protocol before and following the bolus administration of intravenous contrast. RADIATION DOSE REDUCTION: This exam was performed according to the departmental dose-optimization program which includes automated exposure control, adjustment of the mA and/or kV according to patient size and/or use of iterative reconstruction technique. CONTRAST:  OMNIPAQUE IOHEXOL 300 MG/ML  SOLN COMPARISON:  Noncontrast CT on 10/17/2021 FINDINGS: Lower Chest: Small left pleural effusion and tiny right pleural effusion are new since prior exam. Hepatobiliary: No hepatic masses identified. Gallstones are seen, however there is no evidence of cholecystitis or biliary dilatation. Pancreas:  No mass or inflammatory changes.  Spleen: Within normal limits in size and appearance. Adrenals/Urinary Tract: No masses identified. Small benign-appearing right renal cyst again noted (no followup imaging recommended). No evidence of ureteral calculi or hydronephrosis. The urinary bladder is distended and gas is seen throughout the bladder wall, consistent with emphysematous cystitis. Air in the bladder lumen is likely from recent instrumentation. Stomach/Bowel: Small hiatal hernia is again seen. No evidence of obstruction, inflammatory process or abnormal fluid collections. Diverticulosis is seen mainly involving the sigmoid colon, however there is no evidence of diverticulitis. Vascular/Lymphatic: No pathologically enlarged lymph nodes. No acute vascular findings. Aortic atherosclerotic calcification incidentally noted. Reproductive: Prior hysterectomy noted. Adnexal regions are unremarkable in appearance. Other: A small midline epigastric ventral hernia is again seen, which contains only fat. Musculoskeletal:  No suspicious bone lesions identified. IMPRESSION: Acute emphysematous cystitis. No evidence of urinary tract neoplasm, ureteral calculi, or hydronephrosis. Cholelithiasis. No radiographic evidence of cholecystitis. Colonic diverticulosis, without radiographic evidence of diverticulitis. Stable small hiatal hernia and epigastric ventral hernia. New small left and tiny right pleural effusions. Aortic Atherosclerosis (ICD10-I70.0) and Emphysema (ICD10-J43.9). These results will be called to the ordering clinician or representative by the Radiologist Assistant, and communication documented in the PACS or [employer]. Electronically Signed   By: Danae Orleans M.D.   On: 11/30/2021 19:10    Cardiac Studies     Patient Profile     81 y.o. female  status post CABG, hypertension, hyperlipidemia who was admitted on 11/22/2021 for non-STEMI.  Underwent left heart catheterization with PCI in the left main into left circumflex.  Course  complicated on 11/24/2021 with anterior ST elevation in no wall motion abnormality.  Patient is frail, has some behavioral issues, and is deconditioned.  Concerned about safety at home.  Assessment & Plan    1.CAD - history of prior CABG - presented with NSTEMI.  11/23/21 with severe native vessel CAD, patent LIMA-LAD and occluded vein grafts. PCI to native left main and native LCX with 4.0 x 25 mm DES. She was placed on ASA and brilinta x 12 months. Echocardiogram with LVEF 25-30% wit LV thrombus.  - Pt developed ST elevation on EKG 11/24/21 without angina and was taken back to the cath lab for relook and found to have subtotal vein graft to diagonal Debbie [name] occlusion treated with DES.  - medical therapy limited by low bp's, low HRs  2. 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 - ongoing hematuria, brillinta changed to plavix.  CT with acute emphsymatous cysitis - urology recomeneded gentamycin. From note farxiga is risk factor. Monitor until hematuria resolves. Debbie [name]  dosing,levels monitored by pharm - WBC trending down, Hgb 8.1 down from 8.8. Some ongoign hematuria but clearing. Contoinue to monitor - urine cx pending  From cardiac standpoint stable for discharge, however we will continue to monitor hematuria, Hgb, and urine culture results before sending her home. Reassess tomorrow AM.    For questions or updates, please contact [facility] Please consult [email or web address] for contact info under        Signed, Dina Rich, MD  12/01/2021, 10:58 AM

## 2021-12-01 NOTE — Progress Notes (Signed)
Pt declined ambulation, sts she is having diarrhea every time she is up. Encouraged safe room mobility and reviewed her meds with her.  [id number] Ethelda Chick CES, ACSM 12/01/2021 2:02 PM

## 2021-12-02 ENCOUNTER — Encounter (HOSPITAL_COMMUNITY): Payer: Self-pay | Admitting: Internal Medicine

## 2021-12-02 DIAGNOSIS — I255 Ischemic cardiomyopathy: Secondary | ICD-10-CM

## 2021-12-02 DIAGNOSIS — I214 Non-ST elevation (NSTEMI) myocardial infarction: Secondary | ICD-10-CM | POA: Diagnosis not present

## 2021-12-02 HISTORY — DX: Ischemic cardiomyopathy: I25.5

## 2021-12-02 LAB — BASIC METABOLIC PANEL
Anion gap: 9 (ref 5–15)
BUN: 19 mg/dL (ref 8–23)
CO2: 20 mmol/L — ABNORMAL LOW (ref 22–32)
Calcium: 8.2 mg/dL — ABNORMAL LOW (ref 8.9–10.3)
Chloride: 108 mmol/L (ref 98–111)
Creatinine, Ser: 0.98 mg/dL (ref 0.44–1.00)
GFR, Estimated: 58 mL/min — ABNORMAL LOW (ref >60)
Glucose, Bld: 99 mg/dL (ref 70–99)
Potassium: 3 mmol/L — ABNORMAL LOW (ref 3.5–5.1)
Sodium: 137 mmol/L (ref 135–145)

## 2021-12-02 LAB — URINE CULTURE

## 2021-12-02 LAB — CBC
HCT: 24.4 % — ABNORMAL LOW (ref 36.0–46.0)
Hemoglobin: 8 g/dL — ABNORMAL LOW (ref 12.0–15.0)
MCH: 28.4 pg (ref 26.0–34.0)
MCHC: 32.8 g/dL (ref 30.0–36.0)
MCV: 86.5 fL (ref 80.0–100.0)
Platelets: 479 10*3/uL — ABNORMAL HIGH (ref 150–400)
RBC: 2.82 MIL/uL — ABNORMAL LOW (ref 3.87–5.11)
RDW: 15.7 % — ABNORMAL HIGH (ref 11.5–15.5)
WBC: 10.9 10*3/uL — ABNORMAL HIGH (ref 4.0–10.5)
nRBC: 0 % (ref 0.0–0.2)

## 2021-12-02 MED ORDER — POTASSIUM CHLORIDE CRYS ER 20 MEQ PO TBCR
40.0000 meq | EXTENDED_RELEASE_TABLET | Freq: Two times a day (BID) | ORAL | Status: AC
  Administered 2021-12-02 (×2): 40 meq via ORAL
  Filled 2021-12-02 (×2): qty 2

## 2021-12-02 MED ORDER — DIPHENOXYLATE-ATROPINE 2.5-0.025 MG/5ML PO LIQD
10.0000 mL | Freq: Two times a day (BID) | ORAL | Status: AC
  Administered 2021-12-02 (×2): 10 mL via ORAL
  Filled 2021-12-02 (×2): qty 10

## 2021-12-02 MED ORDER — CEPHALEXIN 500 MG PO CAPS
500.0000 mg | ORAL_CAPSULE | Freq: Three times a day (TID) | ORAL | 0 refills | Status: DC
Start: 2021-12-02 — End: 2021-12-05
  Filled 2021-12-02: qty 30, 10d supply, fill #0

## 2021-12-02 NOTE — Progress Notes (Signed)
Progress Note  Patient Name: Debbie [name] Date of Encounter: 12/02/2021  [name] Cardiologist: [name], MD   Subjective   No chest pain, no SOB. Hematuria is improving. Some diarrhea  Inpatient Medications    Scheduled Meds:  apixaban  2.5 mg Oral BID   atorvastatin  80 mg Oral QHS   Chlorhexidine Gluconate Cloth  6 each Topical Daily   cholecalciferol  1,000 Units Oral Daily   clopidogrel  75 mg Oral Daily   ezetimibe  10 mg Oral Daily   levothyroxine  88 mcg Oral Daily   metoprolol succinate  12.5 mg Oral Daily   montelukast  10 mg Oral QHS   pantoprazole  40 mg Oral Daily   Continuous Infusions:  sodium chloride 250 mL (11/30/21 2029)   gentamicin Stopped (11/30/21 2130)   PRN Meds: sodium chloride, acetaminophen, albuterol, ALPRAZolam, nitroGLYCERIN, ondansetron (ZOFRAN) IV, mouth rinse, white petrolatum, zolpidem   Vital Signs    Vitals:   12/01/21 2001 12/02/21 0025 12/02/21 0414 12/02/21 0815  BP: (!) 96/49 116/66 (!) 101/57 126/70  Pulse: 78 72 (!) 57 78  Resp: 18  18   Temp: 98.3 F (36.8 C)   98.4 F (36.9 C)  TempSrc: Oral   Oral  SpO2: 98% 99% 95% 97%  Weight:      Height:        Intake/Output Summary (Last 24 hours) at 12/02/2021 0909 Last data filed at 12/02/2021 0640 Gross per 24 hour  Intake 240 ml  Output 450 ml  Net -210 ml      12/01/2021    6:14 AM 11/30/2021    4:48 AM 11/27/2021    9:34 PM  Last 3 Weights  Weight (lbs) 138 lb 7.2 oz 135 lb 2.3 oz 135 lb 4.8 oz  Weight (kg) 62.8 kg 61.3 kg 61.372 kg      Telemetry    SR - Personally Reviewed  ECG    N/a - Personally Reviewed  Physical Exam   GEN: No acute distress.   Neck: No JVD Cardiac: RRR, no murmurs, rubs, or gallops.  Respiratory: Clear to auscultation bilaterally. GI: Soft, nontender, non-distended  MS: No edema; No deformity. Neuro:  Nonfocal  Psych: Normal affect   Labs    High Sensitivity Troponin:   Recent Labs  Lab  11/22/21 1552 11/22/21 1752 11/24/21 0830  TROPONINIHS 208* 420* >24,000*     Chemistry Recent Labs  Lab 11/27/21 0101 11/28/21 0544 11/29/21 0606 12/02/21 0251  NA 140 140 142 137  K 3.8 3.3* 3.6 3.0*  CL 109 107 107 108  CO2 24 22 21* 20*  GLUCOSE 104* 172* 161* 99  BUN 27* 11 14 19   CREATININE 0.93 0.86 1.02* 0.98  CALCIUM 8.4* 8.7* 9.3 8.2*  MG 2.2  --   --   --   GFRNONAA >60 >60 56* 58*  ANIONGAP 7 11 14 9     Lipids No results for input(s): "CHOL", "TRIG", "HDL", "LABVLDL", "LDLCALC", "CHOLHDL" in the last 168 hours.  Hematology Recent Labs  Lab 11/30/21 0303 12/01/21 0047 12/02/21 0251  WBC 13.2* 8.4 10.9*  RBC 3.12* 2.87* 2.82*  HGB 8.8* 8.1* 8.0*  HCT 26.9* 25.2* 24.4*  MCV 86.2 87.8 86.5  MCH 28.2 28.2 28.4  MCHC 32.7 32.1 32.8  RDW 15.9* 15.7* 15.7*  PLT 356 371 479*   Thyroid No results for input(s): "TSH", "FREET4" in the last 168 hours.  BNPNo results for input(s): "BNP", "PROBNP" in the  last 168 hours.  DDimer No results for input(s): "DDIMER" in the last 168 hours.   Radiology    CT ABDOMEN PELVIS W WO CONTRAST  Result Date: 11/30/2021 CLINICAL DATA:  Gross hematuria. EXAM: CT ABDOMEN AND PELVIS WITHOUT AND WITH CONTRAST TECHNIQUE: Multidetector CT imaging of the abdomen and pelvis was performed following the standard protocol before and following the bolus administration of intravenous contrast. RADIATION DOSE REDUCTION: This exam was performed according to the departmental dose-optimization program which includes automated exposure control, adjustment of the mA and/or kV according to patient size and/or use of iterative reconstruction technique. CONTRAST:  OMNIPAQUE IOHEXOL 300 MG/ML  SOLN COMPARISON:  Noncontrast CT on 10/17/2021 FINDINGS: Lower Chest: Small left pleural effusion and tiny right pleural effusion are new since prior exam. Hepatobiliary: No hepatic masses identified. Gallstones are seen, however there is no evidence of  cholecystitis or biliary dilatation. Pancreas:  No mass or inflammatory changes. Spleen: Within normal limits in size and appearance. Adrenals/Urinary Tract: No masses identified. Small benign-appearing right renal cyst again noted (no followup imaging recommended). No evidence of ureteral calculi or hydronephrosis. The urinary bladder is distended and gas is seen throughout the bladder wall, consistent with emphysematous cystitis. Air in the bladder lumen is likely from recent instrumentation. Stomach/Bowel: Small hiatal hernia is again seen. No evidence of obstruction, inflammatory process or abnormal fluid collections. Diverticulosis is seen mainly involving the sigmoid colon, however there is no evidence of diverticulitis. Vascular/Lymphatic: No pathologically enlarged lymph nodes. No acute vascular findings. Aortic atherosclerotic calcification incidentally noted. Reproductive: Prior hysterectomy noted. Adnexal regions are unremarkable in appearance. Other: A small midline epigastric ventral hernia is again seen, which contains only fat. Musculoskeletal:  No suspicious bone lesions identified. IMPRESSION: Acute emphysematous cystitis. No evidence of urinary tract neoplasm, ureteral calculi, or hydronephrosis. Cholelithiasis. No radiographic evidence of cholecystitis. Colonic diverticulosis, without radiographic evidence of diverticulitis. Stable small hiatal hernia and epigastric ventral hernia. New small left and tiny right pleural effusions. Aortic Atherosclerosis (ICD10-I70.0) and Emphysema (ICD10-J43.9). These results will be called to the ordering clinician or representative by the Radiologist Assistant, and communication documented in the PACS or [employer]. Electronically Signed   By: Danae Orleans M.D.   On: 11/30/2021 19:10    Cardiac Studies     Patient Profile     81 y.o. female  status post CABG, hypertension, hyperlipidemia who was admitted on 11/22/2021 for non-STEMI.  Underwent left  heart catheterization with PCI in the left main into left circumflex.  Course complicated on 11/24/2021 with anterior ST elevation in no wall motion abnormality.  Patient is frail, has some behavioral issues, and is deconditioned.  Concerned about safety at home.  Assessment & Plan    1.CAD - history of prior CABG - presented with NSTEMI.  11/23/21 with severe native vessel CAD, patent LIMA-LAD and occluded vein grafts. PCI to native left main and native LCX with 4.0 x 25 mm DES. She was placed on ASA and brilinta x 12 months. -Echocardiogram with LVEF 25-30% wit LV thrombus.  - Pt developed ST elevation on EKG 11/24/21 without angina and was taken back to the cath lab for relook and found to have subtotal vein graft to diagonal Torianne Laflam occlusion treated with DES.   - medical therapy limited by low bp's, low HRs - she is on atorva 80, eliquis 2.66m bid, plavix 75, zetia 10, toprol 12.5,     2. 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 Remains on eliquis.  - CT with acute emphsymatous cysitis, urology consulted - urology recomeneded gentamycin. From note farxiga is risk factor. Monitor until hematuria resolves. Gent dosing,levels monitored by pharm - -urine culture multiple species. No fever, WBC peak 15.9, trended down to 8.4 with uptrend today 10.9.   - Hgb 10.1, down to 8 this AM significant decrease in rate of decline, has essentially stablized.  - spoke with urology, recs for 1 additional dose IV gent should get tonight and likely home tomorrow on few days of oral keflex they will write for  4. Diarrhea - difficult historian, cannot describe exact timing of diarrhea and thus cannot correlate if started with abx - she does report history of intermittent diarrhea in the past - monitor at this time, will give dose of lomotil today.     Likely discharge tomorrow pending hematuria, cardiac wise she is  stable for discharge.   For questions or updates, please contact [facility] Please consult [email or web address] for contact info under        Signed, Dina Rich, MD  12/02/2021, 9:09 AM

## 2021-12-02 NOTE — Progress Notes (Signed)
8 Days Post-Op   Subjective/Chief Complaint:   1 - Gross Hematuria / Emphysematous Cystitis - new gross hematuria 11/29/21 during hospitalization for NSTEMI and PCI. On ASA + eliquus + plavix. Hematuria CT w/o upper tract lesions but impressive emphysmatous cystitis. She has been on Farxiga (risk factor). WBC 16k, no fevers / tachycardia. 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  Placed on empirig gent.   Today "[name]" is stable. Hgb down some to 8. No clot retention. No fevers / tachycardia / hypotension. Leukocytosis resolved. Hematuria drastically improved today clincially.     Objective: Vital signs in last 24 hours: Temp:  [98.3 F (36.8 C)-98.4 F (36.9 C)] 98.3 F (36.8 C) (07/15 2001) Pulse Rate:  [57-78] 57 (07/16 0414) Resp:  [16-18] 18 (07/16 0414) BP: (96-123)/(49-69) 101/57 (07/16 0414) SpO2:  [95 %-99 %] 95 % (07/16 0414) Last BM Date : 12/01/21  Intake/Output from previous day: 07/15 0701 - 07/16 0700 In: 348 [P.O.:240; IV Piggyback:108] Out: 450 [Urine:450] Intake/Output this shift: No intake/output data recorded.  Physical Exam Cardiovascular:     Rate and Rhythm: Normal rate.  Pulmonary:     Effort: Pulmonary effort is normal.  Musculoskeletal:        General: Normal range of motion.     Cervical back: Normal range of motion.  Neurological:     General: No focal deficit present.     Mental Status: She is alert.   GU: Foely in place with yellow urine with some scant debris. Resolved frank hematuria. NO clots. NO SP TTP. Catheter removed.   Lab Results:  Recent Labs    12/01/21 0047 12/02/21 0251  WBC 8.4 10.9*  HGB 8.1* 8.0*  HCT 25.2* 24.4*  PLT 371 479*   BMET Recent Labs    12/02/21 0251  NA 137  K 3.0*  CL 108  CO2 20*  GLUCOSE 99  BUN 19  CREATININE 0.98  CALCIUM 8.2*   PT/INR No results for input(s): "LABPROT", "INR" in the last 72 hours. ABG No results for input(s): "PHART", "HCO3" in the last 72 hours.  Invalid input(s): "PCO2",  "PO2"  Studies/Results: CT ABDOMEN PELVIS W WO CONTRAST  Result Date: 11/30/2021 CLINICAL DATA:  Gross hematuria. EXAM: CT ABDOMEN AND PELVIS WITHOUT AND WITH CONTRAST TECHNIQUE: Multidetector CT imaging of the abdomen and pelvis was performed following the standard protocol before and following the bolus administration of intravenous contrast. RADIATION DOSE REDUCTION: This exam was performed according to the departmental dose-optimization program which includes automated exposure control, adjustment of the mA and/or kV according to patient size and/or use of iterative reconstruction technique. CONTRAST:  OMNIPAQUE IOHEXOL 300 MG/ML  SOLN COMPARISON:  Noncontrast CT on 10/17/2021 FINDINGS: Lower Chest: Small left pleural effusion and tiny right pleural effusion are new since prior exam. Hepatobiliary: No hepatic masses identified. Gallstones are seen, however there is no evidence of cholecystitis or biliary dilatation. Pancreas:  No mass or inflammatory changes. Spleen: Within normal limits in size and appearance. Adrenals/Urinary Tract: No masses identified. Small benign-appearing right renal cyst again noted (no followup imaging recommended). No evidence of ureteral calculi or hydronephrosis. The urinary bladder is distended and gas is seen throughout the bladder wall, consistent with emphysematous cystitis. Air in the bladder lumen is likely from recent instrumentation. Stomach/Bowel: Small hiatal hernia is again seen. No evidence of obstruction, inflammatory process or abnormal fluid collections. Diverticulosis is seen mainly involving the sigmoid colon, however there is no evidence of diverticulitis. Vascular/Lymphatic: No pathologically enlarged lymph nodes. No acute  vascular findings. Aortic atherosclerotic calcification incidentally noted. Reproductive: Prior hysterectomy noted. Adnexal regions are unremarkable in appearance. Other: A small midline epigastric ventral hernia is again seen, which  contains only fat. Musculoskeletal:  No suspicious bone lesions identified. IMPRESSION: Acute emphysematous cystitis. No evidence of urinary tract neoplasm, ureteral calculi, or hydronephrosis. Cholelithiasis. No radiographic evidence of cholecystitis. Colonic diverticulosis, without radiographic evidence of diverticulitis. Stable small hiatal hernia and epigastric ventral hernia. New small left and tiny right pleural effusions. Aortic Atherosclerosis (ICD10-I70.0) and Emphysema (ICD10-J43.9). These results will be called to the ordering clinician or representative by the Radiologist Assistant, and communication documented in the PACS or [employer]. Electronically Signed   By: Danae Orleans M.D.   On: 11/30/2021 19:10    Anti-infectives: Anti-infectives (From admission, onward)    Start     Dose/Rate Route Frequency Ordered Stop   11/30/21 2045  gentamicin (GARAMYCIN) 310 mg in dextrose 5 % 100 mL IVPB        5 mg/kg  61.3 kg 107.8 mL/hr over 60 Minutes Intravenous Every 48 hours 11/30/21 1954         Assessment/Plan:  1 - Gross Hematuria / Emphysematous Cystitis - impressive on imaging, but fortunatley no sig clots and improving rapidly. This will resolve with likely ABX and HOLD FARXIGA now and at discharge as this is really her only risk factor.  No indiatio for procedural intervention. Foely out today. I feel OK for DC home from [facility] as soon as 7/17 after receives her IV gent dose that day.    Sebastian Ache 12/02/2021

## 2021-12-03 ENCOUNTER — Other Ambulatory Visit (HOSPITAL_COMMUNITY): Payer: Self-pay

## 2021-12-03 DIAGNOSIS — I214 Non-ST elevation (NSTEMI) myocardial infarction: Secondary | ICD-10-CM | POA: Diagnosis not present

## 2021-12-03 DIAGNOSIS — R319 Hematuria, unspecified: Secondary | ICD-10-CM

## 2021-12-03 DIAGNOSIS — I5043 Acute on chronic combined systolic (congestive) and diastolic (congestive) heart failure: Secondary | ICD-10-CM | POA: Diagnosis not present

## 2021-12-03 DIAGNOSIS — Z955 Presence of coronary angioplasty implant and graft: Secondary | ICD-10-CM

## 2021-12-03 DIAGNOSIS — N3080 Other cystitis without hematuria: Secondary | ICD-10-CM

## 2021-12-03 DIAGNOSIS — I255 Ischemic cardiomyopathy: Secondary | ICD-10-CM

## 2021-12-03 DIAGNOSIS — Z951 Presence of aortocoronary bypass graft: Secondary | ICD-10-CM

## 2021-12-03 DIAGNOSIS — R339 Retention of urine, unspecified: Secondary | ICD-10-CM

## 2021-12-03 DIAGNOSIS — E876 Hypokalemia: Secondary | ICD-10-CM

## 2021-12-03 DIAGNOSIS — J438 Other emphysema: Secondary | ICD-10-CM | POA: Diagnosis not present

## 2021-12-03 DIAGNOSIS — E785 Hyperlipidemia, unspecified: Secondary | ICD-10-CM

## 2021-12-03 LAB — BASIC METABOLIC PANEL
Anion gap: 3 — ABNORMAL LOW (ref 5–15)
BUN: 13 mg/dL (ref 8–23)
CO2: 24 mmol/L (ref 22–32)
Calcium: 8.3 mg/dL — ABNORMAL LOW (ref 8.9–10.3)
Chloride: 110 mmol/L (ref 98–111)
Creatinine, Ser: 0.88 mg/dL (ref 0.44–1.00)
GFR, Estimated: >60 mL/min (ref >60)
Glucose, Bld: 97 mg/dL (ref 70–99)
Potassium: 4.1 mmol/L (ref 3.5–5.1)
Sodium: 137 mmol/L (ref 135–145)

## 2021-12-03 LAB — CBC
HCT: 24.8 % — ABNORMAL LOW (ref 36.0–46.0)
Hemoglobin: 7.9 g/dL — ABNORMAL LOW (ref 12.0–15.0)
MCH: 28.1 pg (ref 26.0–34.0)
MCHC: 31.9 g/dL (ref 30.0–36.0)
MCV: 88.3 fL (ref 80.0–100.0)
Platelets: 496 10*3/uL — ABNORMAL HIGH (ref 150–400)
RBC: 2.81 MIL/uL — ABNORMAL LOW (ref 3.87–5.11)
RDW: 15.5 % (ref 11.5–15.5)
WBC: 7.5 10*3/uL (ref 4.0–10.5)
nRBC: 0 % (ref 0.0–0.2)

## 2021-12-03 LAB — MAGNESIUM: Magnesium: 2.2 mg/dL (ref 1.7–2.4)

## 2021-12-03 NOTE — Progress Notes (Signed)
Per shift change report. PM nurse has to I/O cath pt at 0500 due to urinary retention. Bladder scan 880. Removed 900 ml. Pt needs to void. Pt states that we are not giving her lasix and she takes at home. Pt states her bladder drops. I explained that she would need to be able to void on her own since foley was out. Thomas Hoff, RN

## 2021-12-03 NOTE — Progress Notes (Signed)
CARDIAC REHAB PHASE I   Offered to walk with pt earlier, pt requesting to complete breakfast. Returned to offer ambulation, pt ambulated with PT. Pt says some increased strength. Was hoping for d/c today, but still unable to urinate. Pt assisted back to bed. Encouraged continued ambulation. Bed alarm on, bedside table within reach. Will continue to follow.  [id number] Reynold Bowen, RN BSN 12/03/2021 11:19 AM

## 2021-12-03 NOTE — Discharge Summary (Cosign Needed Addendum)
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   Ost LM to Dist LM lesion is 25% stenosed.   Dist Cx lesion is 50% stenosed.   Origin to Prox Graft lesion is 99% stenosed.   Previously placed Ost Cx to Prox Cx stent of unknown type is  widely patent.   A drug-eluting stent was successfully placed using a STENT ONYX FRONTIER 3.0X18.   Post intervention, there is a 0% residual stenosis. _____________   Limited Echo 11/27/21:  1. Follow up limited study with Definity shows persistent apical left  ventricular thrombus. The clot now appears fixed, although with a  relatively narrow attachment. Width 7 mm, length 13 mm.. Left ventricular  ejection fraction, by estimation, is 25 to  30%. The left ventricle has severely decreased function.  _____________   History of Present Illness     DEITRA CRAINE is a 81 y.o. female  status post CABG, hypertension, hyperlipidemia who was admitted on 11/22/2021 for non-STEMI.  Underwent left heart catheterization with PCI in the left main into left circumflex.  Course  complicated on 11/25/5883 with anterior ST elevation in no wall motion abnormality.   Hospital Course     Consultants: Urology   CAD s/p CABG, now PCI to left main-LCX Inferoapical LV thrombus - acute Hematuria  Right groin hematoma resolved, extensive ecchymosis Acute systolic heart failure - new ischemic cardiomyopathy AKI Hypertension - borderline BP this admission Pt presented with NSTEMI this admission. Heart catheterization 11/23/21 with severe native vessel CAD, patent LIMA-LAD and occluded vein grafts. PCI to native left main and native LCX with 4.0 x 25 mm DES. She was placed on ASA and brilinta x 12 months. Echocardiogram with LVEF 25-30% wit LV thrombus.  Eliquis was added to ASA and brilinta. She developed hematuria without clot and stable Hb (actually increased). No bleeding at cath site. Therefore, ASA was stopped and she was continued on brilinta and eliquis 2.5 mg BID. Unfortunately, hematuria persisted and brilinta was transitioned to plavix with 300 mg load.   Pt developed ST elevation on EKG 11/24/21 without angina and was taken back to the cath lab for relook and found to have subtotal vein graft to diagonal branch occlusion treated with DES. Now with persistent anterior TWI   Course complicated by marginal BP resulting in holding GDMT, and AKI. AKI now resolved, but BP continues to be somewhat soft and labile. BB held due to bradycardia, initially. Prior to discharge, we were able to add 12.5 mg toprol   GDMT: toprol   Attempt to add therapy as tolerated in  the OP setting. Home medications that were held: 2.5 bisoprolol daily, 5 mg amlodipine, and 20 mg lisinopril. -- Plan to add back imdur vs ARB/ARNI as outpatient   Hyperlipidemia with LDL goal < 70 -- Added zetia and 80 mg lipitor   Hematuria Emphysematous cystitis Acute urinary retention Transitioned brilinta to plavix. Seen by urology and placed on IV gentamycin. Recommendations for 10 days of Keflex at discharge  with outpatient follow up -- Wilder Glade was stopped -- was unable void prior to discharge, spoke with urology with recommendations for foley cath to be placed and continued at discharge -- Urology will reach out to patient to schedule follow up for voiding trial    Disposition She was evaluated by PT and deemed stable for discharge home with [facility] PT. Pt states she has friends to come stay with her. Appreciate SW help with discharge coordination.  General: Well developed, well nourished, female appearing in no acute distress. Head: Normocephalic, atraumatic.  Neck: Supple without bruits, JVD. Lungs:  Resp regular and unlabored, CTA. Heart: RRR, S1, S2, no S3, S4, or murmur; no rub. Abdomen: Soft, non-tender, non-distended with normoactive bowel sounds. No hepatomegaly. No rebound/guarding. No obvious abdominal masses. Extremities: No clubbing, cyanosis, edema. Distal pedal pulses are 2+ bilaterally. Right femoral cath site stable with bruising. Neuro: Alert and oriented X 3. Moves all extremities spontaneously. Psych: Normal affect.   Pt seen and examined by Dr. Ellyn Hack and deemed stable for discharge. Cardiology follow has been arranged.   Did the patient have an acute coronary syndrome (MI, NSTEMI, STEMI, etc) this admission?:  Yes                               AHA/ACC Clinical Performance & Quality Measures: Aspirin prescribed? - No - monotherapy with Plavix  ADP Receptor Inhibitor (Plavix/Clopidogrel, Brilinta/Ticagrelor or Effient/Prasugrel) prescribed (includes medically managed patients)? - Yes Beta Blocker prescribed? - Yes High Intensity Statin (Lipitor 40-80mg  or Crestor 20-40mg ) prescribed? - Yes EF assessed during THIS hospitalization? - Yes For EF <40%, was ACEI/ARB prescribed? - Not Applicable (EF >/= 67%) For EF <40%, Aldosterone Antagonist (Spironolactone or Eplerenone) prescribed? - Not Applicable (EF >/= 12%) Cardiac Rehab Phase II ordered (including medically managed  patients)? - Yes       The patient will be scheduled for a TOC follow up appointment in 10-14 days.  A message has been sent to the [facility] and Scheduling Pool at the office where the patient should be seen for follow up.  _____________  Discharge Vitals Blood pressure (!) 110/58, pulse 73, temperature 98.1 F (36.7 C), temperature source Oral, resp. rate 18, height 5\' 2"  (1.575 m), weight 62.8 kg, SpO2 99 %.  Filed Weights   11/27/21 2134 11/30/21 0448 12/01/21 0614  Weight: 61.4 kg 61.3 kg 62.8 kg    Labs & Radiologic Studies    CBC Recent Labs    12/02/21 0251 12/03/21 0253  WBC 10.9* 7.5  HGB 8.0* 7.9*  HCT 24.4* 24.8*  MCV 86.5 88.3  PLT 479* 458*   Basic Metabolic Panel Recent Labs    12/02/21 0251 12/03/21 0253  NA 137 137  K 3.0* 4.1  CL 108 110  CO2 20* 24  GLUCOSE 99 97  BUN 19 13  CREATININE 0.98 0.88  CALCIUM 8.2* 8.3*  MG  --  2.2   Liver Function Tests No results for input(s): "AST", "ALT", "ALKPHOS", "BILITOT", "PROT", "ALBUMIN" in the  last 72 hours. No results for input(s): "LIPASE", "AMYLASE" in the last 72 hours. High Sensitivity Troponin:   Recent Labs  Lab 11/22/21 1552 11/22/21 1752 11/24/21 0830  TROPONINIHS 208* 420* >24,000*    BNP Invalid input(s): "POCBNP" D-Dimer No results for input(s): "DDIMER" in the last 72 hours. Hemoglobin A1C Recent Labs    12/01/21 0047  HGBA1C 6.4*   Fasting Lipid Panel No results for input(s): "CHOL", "HDL", "LDLCALC", "TRIG", "CHOLHDL", "LDLDIRECT" in the last 72 hours. Thyroid Function Tests No results for input(s): "TSH", "T4TOTAL", "T3FREE", "THYROIDAB" in the last 72 hours.  Invalid input(s): "FREET3" _____________  CT ABDOMEN PELVIS W WO CONTRAST  Result Date: 11/30/2021 CLINICAL DATA:  Gross hematuria. EXAM: CT ABDOMEN AND PELVIS WITHOUT AND WITH CONTRAST TECHNIQUE: Multidetector CT imaging of the abdomen and pelvis was performed following the standard protocol before and following  the bolus administration of intravenous contrast. RADIATION DOSE REDUCTION: This exam was performed according to the departmental dose-optimization program which includes automated exposure control, adjustment of the mA and/or kV according to patient size and/or use of iterative reconstruction technique. CONTRAST:  183mL OMNIPAQUE IOHEXOL 300 MG/ML  SOLN COMPARISON:  Noncontrast CT on 10/17/2021 FINDINGS: Lower Chest: Small left pleural effusion and tiny right pleural effusion are new since prior exam. Hepatobiliary: No hepatic masses identified. Gallstones are seen, however there is no evidence of cholecystitis or biliary dilatation. Pancreas:  No mass or inflammatory changes. Spleen: Within normal limits in size and appearance. Adrenals/Urinary Tract: No masses identified. Small benign-appearing right renal cyst again noted (no followup imaging recommended). No evidence of ureteral calculi or hydronephrosis. The urinary bladder is distended and gas is seen throughout the bladder wall, consistent with emphysematous cystitis. Air in the bladder lumen is likely from recent instrumentation. Stomach/Bowel: Small hiatal hernia is again seen. No evidence of obstruction, inflammatory process or abnormal fluid collections. Diverticulosis is seen mainly involving the sigmoid colon, however there is no evidence of diverticulitis. Vascular/Lymphatic: No pathologically enlarged lymph nodes. No acute vascular findings. Aortic atherosclerotic calcification incidentally noted. Reproductive: Prior hysterectomy noted. Adnexal regions are unremarkable in appearance. Other: A small midline epigastric ventral hernia is again seen, which contains only fat. Musculoskeletal:  No suspicious bone lesions identified. IMPRESSION: Acute emphysematous cystitis. No evidence of urinary tract neoplasm, ureteral calculi, or hydronephrosis. Cholelithiasis. No radiographic evidence of cholecystitis. Colonic diverticulosis, without radiographic  evidence of diverticulitis. Stable small hiatal hernia and epigastric ventral hernia. New small left and tiny right pleural effusions. Aortic Atherosclerosis (ICD10-I70.0) and Emphysema (ICD10-J43.9). These results will be called to the ordering clinician or representative by the Radiologist Assistant, and communication documented in the PACS or [employer]. Electronically Signed   By: Marlaine Hind M.D.   On: 11/30/2021 19:10   ECHOCARDIOGRAM LIMITED  Result Date: 11/27/2021    ECHOCARDIOGRAM LIMITED REPORT   Patient Name:   ASENETH HACK Date of Exam: 11/27/2021 Medical Rec #:  [id number]           Height:       62.0 in Accession #:    [id number]          Weight:       133.2 lb Date of Birth:  06/24/1940           BSA:          1.608 m Patient Age:    42 years            BP:  125/71 mmHg Patient Gender: F                   HR:           75 bpm. Exam Location:  Inpatient Procedure: Limited Echo and Intracardiac Opacification Agent Indications:    CHF  History:        Patient has prior history of Echocardiogram examinations, most                 recent 11/24/2021. CAD and NSTEMI, Prior CABG, COPD; Risk                 Factors:Dyslipidemia and Former Smoker.  Sonographer:    Merrie Roof RDCS Referring Phys: [place]  1. Follow up limited study with Definity shows persistent apical left ventricular thrombus. The clot now appears fixed, although with a relatively narrow attachment. Width 7 mm, length 13 mm.. Left ventricular ejection fraction, by estimation, is 25 to 30%. The left ventricle has severely decreased function. FINDINGS  Left Ventricle: Follow up limited study with Definity shows persistent apical left ventricular thrombus. The clot now appears fixed, although with a relatively narrow attachment. Width 7 mm, length 13 mm. Left ventricular ejection fraction, by estimation, is 25 to 30%. The left ventricle has severely decreased function.  LV Wall Scoring: The apical  anterior segment, apical inferior segment, and apex are dyskinetic. The mid anteroseptal segment, apical lateral segment, and mid anterior segment are akinetic. The apical septal segment is hypokinetic. The antero-lateral wall, inferior wall, posterior wall, basal anteroseptal segment, mid inferoseptal segment, basal anterior segment, and basal inferoseptal segment are normal. Pericardium: There is no evidence of pericardial effusion.  LV Volumes (MOD) LV vol d, MOD A2C: 39.3 ml LV vol d, MOD A4C: 76.5 ml LV vol s, MOD A2C: 27.9 ml LV vol s, MOD A4C: 38.1 ml LV SV MOD A2C:     11.4 ml LV SV MOD A4C:     76.5 ml LV SV MOD BP:      24.3 ml Dani Gobble Croitoru MD Electronically signed by Sanda Klein MD Signature Date/Time: 11/27/2021/12:18:50 PM    Final    VAS [place] GROIN PSEUDOANEURYSM  Result Date: 11/26/2021  ARTERIAL PSEUDOANEURYSM  Patient Name:  AMAURI MEDELLIN  Date of Exam:   11/25/2021 Medical Rec #: [id number]            Accession #:    [id number] Date of Birth: [birth year]            Patient Gender: F Patient Age:   68 years Exam Location:  [facility] Procedure:      VAS [place] GROIN PSEUDOANEURYSM Referring Phys: [place] Bells O'NEAL --------------------------------------------------------------------------------  Exam: Right groin Indications: Patient complains of groin pain and bruising. History: S/p catheterization 11/23/21 and 11/24/21. Comparison Study: No prior study on file Performing Technologist: Sharion Dove RVS  Examination Guidelines: A complete evaluation includes B-mode imaging, spectral Doppler, color Doppler, and power Doppler as needed of all accessible portions of each vessel. Bilateral testing is considered an integral part of a complete examination. Limited examinations for reoccurring indications may be performed as noted.  Summary: No evidence of pseudoaneurysm, AVF or DVT  Diagnosing physician: Monica Martinez MD Electronically signed by Monica Martinez MD on 11/26/2021 at 1:41:57 PM.    --------------------------------------------------------------------------------    Final    CARDIAC CATHETERIZATION  Result Date: 11/24/2021 Images from the original result were not included.   Dist LM to Prox LAD  lesion is 100% stenosed.   Ost LM to Dist LM lesion is 25% stenosed.   Dist Cx lesion is 50% stenosed.   Origin to Prox Graft lesion is 99% stenosed.   Previously placed Ost Cx to Prox Cx stent of unknown type is  widely patent.   A drug-eluting stent was successfully placed using a STENT ONYX FRONTIER 3.0X18.   Post intervention, there is a 0% residual stenosis. FRANCENIA CHIMENTI is a 81 y.o. female  [id number] LOCATION:  FACILITY: [facility] PHYSICIAN: Quay Burow, M.D. [birth year] DATE OF PROCEDURE:  11/24/2021 DATE OF DISCHARGE: CARDIAC CATHETERIZATION / PCI DES Diag SVG History obtained from chart review.KYNSIE FALKNER is a 81 y.o. female with CAD status post CABG, hypertension, hyperlipidemia who was admitted on 11/22/2021 for non-STEMI.  Underwent left heart catheterization with PCI in the left main into left circumflex.  Course complicated on 07/24/1060 with anterior ST elevation, severe LV dysfunction with an LAD wall motion abnormality.  Because of this Dr. Audie Box  felt that she should have relook coronary angiography.  The patient was however asymptomatic. PROCEDURE DESCRIPTION: The patient was brought to the second floor [facility] cath lab in the postabsorptive state.  She was premedicated with IV Solu-Medrol and Benadryl for contrast allergy prophylaxis.  Her right groin was prepped and shaved in usual sterile fashion. Xylocaine 1% was used for local anesthesia. A 5 French sheath was inserted into the right common femoral artery using standard Seldinger technique.  Ultrasound was used to identify the right common femoral artery and guide access.  A digital image was captured and placed the patient's chart.  5 French right left second Silastic catheters were used for selective coronary  angiography, selective vein graft and IMA graft angiography and obtain left heart pressures.  Isovue dye was used for the entirety of the case (95 cc of Contrast total to patient).  Retrograde aortic, ventricular and pullback pressures were recorded.  The LVEDP was measured at 31 mmHg. Her left main into proximal circumflex stent was widely patent.  The LAD was occluded at the origin which was an old finding.  Her LIMA was small in caliber and was somewhat atretic.  The distal LAD was small after the anastomosis.  The nondominant right was not visualized.  The sequential vein to an obtuse marginal branch and PDA was widely patent.  The vein to the diagonal branch was subtotally occluded with a 99% stenosis and TIMI I flow.  This did fill a diagonal branch and backfill the LAD.  I assume that this was the "culprit vessel", and plan is to perform PCI drug-eluting stenting. The patient was already on aspirin and Brilinta.  She received 7000's of heparin with an ACT of 420.  Isovue dye is used for the entirety of the case.  Retroaortic pressures monitored to the case.  I crossed the proximal diagonal vein graft lesion with some difficulty.  I predilated with a 2 mm balloon.  There did appear to be a filling defect beyond the stenosis suggestive of thrombus.  I attempted to place a 4 mm spider distal protection device but this did not pass the proximal lesion and therefore I decided simply to stent and cover the angiographic thrombus.  I placed a 3 mm x 18 mm long Medtronic Onyx frontier drug-eluting stent from the ostium of the vein graft beyond the lesion in the incorporating the filling defect deployed at 14 atm (3.08 mm) resulting in reduction of a 99% proximal diagonal  branch SV's G stenosis with TIMI I flow to 0% residual TIMI-3 flow.  There is no residual filling defect nor did there appear to be distal thromboembolic disease.  The guidewire and catheter were removed.  The sheath was was secured in place.  The  patient did receive a bolus of Aggrastat and a brief infusion which was discontinued.   Widely patent left main into the circumflex stent performed yesterday by Dr. Burt Knack.  Atretic appearing LIMA to a small distal LAD.  Patent sequential vein to an OM and PDA with high-grade proximal SVG to diagonal vein graft stenosis probably representing the "culprit lesion with an excellent angiographic result after PCI drug-eluting stenting.  Hopefully her anterior wall stunned and will come back.  The sheath will be removed once ACT falls below 170 pressure held.  I am going to restart heparin 4 hours after sheath removal for 24 hours.  Given her elevated LVEDP, she probably should be quire some diuresis as well.  She left the lab in stable condition. Quay Burow. MD, [facility] 11/24/2021 12:51 PM    ECHOCARDIOGRAM LIMITED  Result Date: 11/24/2021    ECHOCARDIOGRAM LIMITED REPORT   Patient Name:   JALEESA CERVI Date of Exam: 11/24/2021 Medical Rec #:  [id number]           Height:       62.5 in Accession #:    [id number]          Weight:       128.3 lb Date of Birth:  31-May-1940           BSA:          1.592 m Patient Age:    37 years            BP:           115/91 mmHg Patient Gender: F                   HR:           73 bpm. Exam Location:  Inpatient Procedure: Limited Echo, Cardiac Doppler, Color Doppler and Intracardiac            Opacification Agent Indications:    NSTEMI I21.4  History:        Patient has prior history of Echocardiogram examinations, most                 recent 10/19/2021. CAD and NSTEMI, Prior CABG, COPD,                 Signs/Symptoms:Chest Pain; Risk Factors:Dyslipidemia and Former                 Smoker. Successful PCI of the native left main and native                 circumflex 11/23/21.  Sonographer:    Darlina Sicilian RDCS Referring Phys: Eleonore Chiquito, Marcello Moores  Sonographer Comments: Severe native vessel coronary artery disease with severe ostial left main stenosis, total occlusion of the LAD,  severe stenosis in the distal left main/circumflex junction, severe diffuse stenosis of the distal AV circumflex, and a diffusely diseased nondominant RCA. IMPRESSIONS  1. Suspect small 0.5 x 1 mm mobile thrombus noted at the inferior apex (image 38) by Definity contrast - the enterior anterior wall and apex are akinetic. Left ventricular ejection fraction, by estimation, is 25 to 30%. The left ventricle has severely decreased function. The left ventricle demonstrates regional wall  motion abnormalities (see scoring diagram/findings for description). There is severe akinesis of the left ventricular, entire anterior wall, anteroseptal wall, apical segment and inferoapical segment.  2. Right ventricular systolic function is low normal. The right ventricular size is normal. There is moderately elevated pulmonary artery systolic pressure.  3. The mitral valve is abnormal. Mild to moderate mitral valve regurgitation.  4. The aortic valve is tricuspid. Aortic valve regurgitation is not visualized. Comparison(s): Changes from prior study are noted. 10/19/2021: LVEF 60-65%. Conclusion(s)/Recommendation(s): Critical findings reported to Dr. Audie Box and acknowledged at 11:59 am. FINDINGS  Left Ventricle: Suspect small 0.5 x 1 mm mobile thrombus noted at the inferior apex (image 38) by Definity contrast - the enterior anterior wall and apex are akinetic. Left ventricular ejection fraction, by estimation, is 25 to 30%. The left ventricle has severely decreased function. The left ventricle demonstrates regional wall motion abnormalities. Severe akinesis of the left ventricular, entire anterior wall, anteroseptal wall, apical segment and inferoapical segment. Definity contrast agent was given IV to delineate the left ventricular endocardial borders. Right Ventricle: The right ventricular size is normal. No increase in right ventricular wall thickness. Right ventricular systolic function is low normal. There is moderately elevated  pulmonary artery systolic pressure. The tricuspid regurgitant velocity  is 3.06 m/s, and with an assumed right atrial pressure of 8 mmHg, the estimated right ventricular systolic pressure is 51.8 mmHg. Mitral Valve: The mitral valve is abnormal. There is mild calcification of the anterior and posterior mitral valve leaflet(s). Mild to moderate mitral valve regurgitation. Tricuspid Valve: The tricuspid valve is grossly normal. Tricuspid valve regurgitation is mild. Aortic Valve: The aortic valve is tricuspid. Aortic valve regurgitation is not visualized. Pulmonic Valve: The pulmonic valve was grossly normal. Pulmonic valve regurgitation is trivial.  LV Volumes (MOD) LV vol d, MOD A2C: 89.5 ml LV vol d, MOD A4C: 101.0 ml LV vol s, MOD A2C: 55.4 ml LV vol s, MOD A4C: 68.3 ml LV SV MOD A2C:     34.1 ml LV SV MOD A4C:     101.0 ml LV SV MOD BP:      33.9 ml AORTIC VALVE             PULMONIC VALVE LVOT Vmax:   53.20 cm/s  RVOT Peak grad: 1 mmHg LVOT Vmean:  33.200 cm/s LVOT VTI:    0.081 m TRICUSPID VALVE TR Peak grad:   37.5 mmHg TR Vmax:        306.00 cm/s  SHUNTS Systemic VTI: 0.08 m Pulmonic VTI: 0.053 m Lyman Bishop MD Electronically signed by Lyman Bishop MD Signature Date/Time: 11/24/2021/12:01:54 PM    Final    CARDIAC CATHETERIZATION  Result Date: 11/23/2021 1.  Severe native vessel coronary artery disease with severe ostial left main stenosis, total occlusion of the LAD, severe stenosis in the distal left main/circumflex junction, severe diffuse stenosis of the distal AV circumflex, and a diffusely diseased nondominant RCA 2.  Status post aortocoronary bypass surgery with continued patency of the LIMA to LAD and occlusion of the saphenous vein grafts 3.  Successful PCI of the native left main and native circumflex using a 4.0 x 24 mm Synergy DES Recommendation: Dual antiplatelet therapy with aspirin and ticagrelor at least 12 months, aggressive risk reduction measures, suspect the patient occluded a vein  graft causing her acute coronary syndrome.  There were no vein graft targets for PCI.  Her native left main into the circumflex is treated as this large territory is now potentially  ischemic after occlusion of the sequential saphenous vein graft to OM and PLA.   DG Chest Port 1 View  Result Date: 11/22/2021 CLINICAL DATA:  Chest pain with weakness. EXAM: PORTABLE CHEST 1 VIEW COMPARISON:  Radiographs 12/31/2018 and 10/17/2021. Abdominal CT 10/17/2021. FINDINGS: 1644 hours. The heart size and mediastinal contours are stable status post median sternotomy. There is aortic atherosclerosis and a small hiatal hernia. Lower lung volumes with mildly increased patchy opacities at both lung bases, likely reflecting atelectasis. No confluent airspace opacity, significant pleural effusion or pneumothorax. No acute osseous findings are evident. Glenohumeral degenerative changes bilaterally and a thoracolumbar scoliosis. Telemetry leads overlie the chest. IMPRESSION: 1. Lower lung volumes with probable resulting mild bibasilar atelectasis. No confluent airspace opacity or significant pleural effusion. 2. Aortic atherosclerosis post median sternotomy. Electronically Signed   By: Richardean Sale M.D.   On: 11/22/2021 16:58    Disposition   Pt is being discharged home today in good condition.  Follow-up Plans & Appointments     Follow-up Information     [facility], [facility] Follow up.   Specialty: [place] Why: HHRN/PT arranged- they will contact you to schedule visits post discharge Contact information: [place] [address] [phone]         Strader, [place], PA-C Follow up on 12/12/2021.   Specialties: Physician Assistant, Cardiology Why: at 2:30pm for your follow up cardiology appt. Contact information: [place] [address] [phone]         Alexis Frock, MD Follow up.   Specialty: Urology Why: Office will call you with a  follow up appt Contact information: [place] [address] [phone]                Discharge Instructions     Amb Referral to Cardiac Rehabilitation   Complete by: As directed    Diagnosis:  NSTEMI Coronary Stents     After initial evaluation and assessments completed: Virtual Based Care may be provided alone or in conjunction with Phase 2 Cardiac Rehab based on patient barriers.: Yes   Diet - low sodium heart healthy   Complete by: As directed    Diet - low sodium heart healthy   Complete by: As directed    Discharge instructions   Complete by: As directed    No driving. No lifting over 5 lbs for 1 week. No sexual activity for 1 week. Keep procedure site clean & dry. If you notice increased pain, swelling, bleeding or pus, call/return!  You may shower, but no soaking baths/hot tubs/pools for 1 week.   She has extensive bruising at her groin site and hematuria. Monitor bleeding, collect CBC in 1 week.   Discharge instructions   Complete by: As directed    No driving. No lifting over 5 lbs for 1 week. No sexual activity for 1 week. Keep procedure site clean & dry. If you notice increased pain, swelling, bleeding or pus, call/return!  You may shower, but no soaking baths/hot tubs/pools for 1 week.   Discharge instructions   Complete by: As directed    Groin Site Care Refer to this sheet in the next few weeks. These instructions provide you with information on caring for yourself after your procedure. Your caregiver may also give you more specific instructions. Your treatment has been planned according to current medical practices, but problems sometimes occur. Call your caregiver if you have any problems or questions after  your procedure. HOME CARE INSTRUCTIONS You may shower 24 hours after the procedure. Remove the bandage (dressing) and gently wash the site with plain soap and water. Gently pat the site dry.  Do not apply powder or lotion to the site.  Do not  sit in a bathtub, swimming pool, or whirlpool for 5 to 7 days.  No bending, squatting, or lifting anything over 10 pounds (4.5 kg) as directed by your caregiver.  Inspect the site at least twice daily.  Do not drive home if you are discharged the same day of the procedure. Have someone else drive you.  You may drive 24 hours after the procedure unless otherwise instructed by your caregiver.  What to expect: Any bruising will usually fade within 1 to 2 weeks.  Blood that collects in the tissue (hematoma) may be painful to the touch. It should usually decrease in size and tenderness within 1 to 2 weeks.  SEEK IMMEDIATE MEDICAL CARE IF: You have unusual pain at the groin site or down the affected leg.  You have redness, warmth, swelling, or pain at the groin site.  You have drainage (other than a small amount of blood on the dressing).  You have chills.  You have a fever or persistent symptoms for more than 72 hours.  You have a fever and your symptoms suddenly get worse.  Your leg becomes pale, cool, tingly, or numb.  You have heavy bleeding from the site. Hold pressure on the site. [name]  PLEASE DO NOT MISS ANY DOSES OF YOUR PLAVIX!!!!! Also keep a log of you blood pressures and bring back to your follow up appt. Please call the office with any questions.   Patients taking blood thinners should generally stay away from medicines like ibuprofen, Advil, Motrin, naproxen, and Aleve due to risk of stomach bleeding. You may take Tylenol as directed or talk to your primary doctor about alternatives.   PLEASE ENSURE THAT YOU DO NOT RUN OUT OF YOUR PLAVIX. This medication is very important to remain on for at least one year. IF you have issues obtaining this medication due to cost please CALL the office 3-5 business days prior to running out in order to prevent missing doses of this medication.   The urology office will call you with a follow up appointment regarding removing the urinary catheter.    Discharge wound care:   Complete by: As directed    As directed   Increase activity slowly   Complete by: As directed    Increase activity slowly   Complete by: As directed    Increase activity slowly   Complete by: As directed    No wound care   Complete by: As directed    No wound care   Complete by: As directed        Discharge Medications   Allergies as of 12/03/2021       Reactions   Ivp Dye [iodinated Contrast Media] Swelling   Kidney Dye   Betadine [povidone Iodine] Rash   Codeine Nausea And Vomiting   Other Nausea And Vomiting   Narcotics - nausea,vomiting   Penicillins Rash   Did it involve swelling of the face/tongue/throat, SOB, or low BP? No Did it involve sudden or severe rash/hives, skin peeling, or any reaction on the inside of your mouth or nose? No Did you need to seek medical attention at a hospital or doctor's office? No When did it last happen?       If  all above answers are "NO", may proceed with cephalosporin use.   Sulfa Antibiotics Rash        Medication List     STOP taking these medications    amLODipine 5 MG tablet Commonly known as: NORVASC   aspirin 81 MG tablet   bisoprolol 5 MG tablet Commonly known as: ZEBETA   lisinopril 20 MG tablet Commonly known as: ZESTRIL   potassium chloride 10 MEQ CR capsule Commonly known as: MICRO-K Replaced by: potassium chloride 10 MEQ tablet       TAKE these medications    acetaminophen 650 MG CR tablet Commonly known as: TYLENOL Take 650 mg by mouth in the morning.   atorvastatin 80 MG tablet Commonly known as: LIPITOR Take 1 tablet (80 mg total) by mouth at bedtime.   baclofen 10 MG tablet Commonly known as: LIORESAL Take 10 mg by mouth daily.   cephALEXin 500 MG capsule Commonly known as: KEFLEX Take 1 capsule (500 mg total) by mouth 3 (three) times daily for 10 days. To finish treating bladder infection   clopidogrel 75 MG tablet Commonly known as: PLAVIX Take 1 tablet  (75 mg total) by mouth daily.   Eliquis 2.5 MG Tabs tablet Generic drug: apixaban Take 1 tablet (2.5 mg total) by mouth 2 (two) times daily.   ezetimibe 10 MG tablet Commonly known as: ZETIA Take 1 tablet (10 mg total) by mouth daily.   famotidine 20 MG tablet Commonly known as: PEPCID Take 40 mg by mouth at bedtime.   furosemide 40 MG tablet Commonly known as: LASIX Take 1 tablet (40 mg total) by mouth daily as needed (for weight gain of 3 lbs overnight or 5 lbs in 1 week.). What changed:  how much to take when to take this reasons to take this Another medication with the same name was removed. Continue taking this medication, and follow the directions you see here.   isosorbide mononitrate 30 MG 24 hr tablet Commonly known as: IMDUR Take 1 tablet (30 mg total) by mouth daily.   levocetirizine 5 MG tablet Commonly known as: XYZAL Take 5 mg by mouth every evening. Equate levocetirizine.   levothyroxine 88 MCG tablet Commonly known as: SYNTHROID Take 88 mcg by mouth daily.   metoprolol succinate 25 MG 24 hr tablet Commonly known as: TOPROL-XL Take 0.5 tablets (12.5 mg total) by mouth daily.   nitroGLYCERIN 0.4 MG SL tablet Commonly known as: NITROSTAT Place 1 tablet (0.4 mg total) under the tongue every 5 (five) minutes x 3 doses as needed for chest pain (if no relief after 3rd dose, call 911 or proceed to the ED for an evaluation).   ondansetron 4 MG tablet Commonly known as: ZOFRAN Take 1 tablet (4 mg total) by mouth every 6 (six) hours as needed for nausea.   pantoprazole 40 MG tablet Commonly known as: PROTONIX Take 40 mg by mouth daily.   potassium chloride 10 MEQ tablet Commonly known as: KLOR-CON M Take 2 tablets (20 mEq total) by mouth daily as needed (only on days she takes lasix.). Replaces: potassium chloride 10 MEQ CR capsule   tamsulosin 0.4 MG Caps capsule Commonly known as: FLOMAX Take 0.4 mg by mouth.   umeclidinium-vilanterol 62.5-25 MCG/INH  Aepb Commonly known as: ANORO ELLIPTA Inhale 1 puff into the lungs daily.               Discharge Care Instructions  (From admission, onward)  Start     Ordered   11/28/21 0000  Discharge wound care:       Comments: As directed   11/28/21 1039               Outstanding Labs/Studies   FLP/LFTs in 8 weeks CBC/BMET at follow up  Duration of Discharge Encounter   Greater than 30 minutes including physician time.  Signed, Reino Bellis, NP 12/03/2021, 2:58 PM  ATTENDING ATTESTATION  I have seen, examined and evaluated the patient on the day of discharge along with Reino Bellis, NP.  After reviewing all the available data and chart, we discussed the patients laboratory, study & physical findings as well as symptoms in detail. I agree with her findings, examination as well as impression recommendations as per our discussion.     Patient presented with non-STEMI and found to have significant native vessel CAD on initial catheterization but grafts were not visualized on this first catheterization.  She had stent to the left main into the native circumflex for presumed native circumflex disease as the culprit.  However the next day she had worsening EKG changes and wall motion abnormalities concerning for ongoing ischemia with ST elevations in the anterior leads.  She was taken taken to the Cath Lab where the on relook cath was found to have patent graft to the right system and the heavily diseased graft to the diagonal that was treated with DES stent.  Since then she has done well clinically.  She did have some mild heart failure exacerbation that has stabilized and is appropriately being treated.  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  Initially did okay with in and out catheterization however plans for discharge to have outpatient urology follow-up on antibiotics did not successfully come the past.  She ended  up having urinary retention unable to void without in and out catheterization the catheter through the weekend.  On the morning of discharge she was still not able to void and therefore after discussion with urology, the decision was made to place a Foley catheter to allow him to be discharged.  She is on antibiotics for her infection which includes Keflex and gentamicin dosed every other day.  Her cardiac medications have been titrated and were stable for discharge as of several days prior to discharge.  No active cardiac issues over the weekend.  Ready for discharge with close cardiology and urology follow-up.   Glenetta Hew, M.D., M.S. Interventional Cardiologist   Pager # [phone] Phone # [phone] [address] [place], [place] [address]

## 2021-12-03 NOTE — Progress Notes (Signed)
Pt given discharge instructions, medication lists, follow up appointments, and when to call the doctor.  Pt verbalizes understanding. Replaced standard urine bag with leg bag for discharge.  Assisted patient with washing hair and dressing. Will transport to main entrance via wheelchair. All TOC medications in room and sent home with patient. Thomas Hoff, RN

## 2021-12-03 NOTE — Progress Notes (Signed)
Physical Therapy Treatment Patient Details Name: Debbie [name] MRN: [id number] DOB: 1940-08-17 Today's Date: 12/03/2021   History of Present Illness Pt is an 81 y.o. female admitted 11/22/21 with NSTEMI. S/p LHC with PCI in L main to L circumflex. Course complicated on 7/8 with anterior ST elevation in no wall motion abnormality. PMH includes CAD, COPD, HTN, asthma, arthritis, GERD, osteoporosis, anxiety.    PT Comments    Pt received seated EOB and agreeable to session with continued progress towards goals. Pt demonstrating increased stability with mobility this session with RW and min guard for safety throughout transfers and ambulation. Pt able to self manage RW around obstacles in hall and BR without assist with noted increase in safety awareness and decreased impulsivity. Pt with good tolerance for LE therex for increased strength. Continued education re; activity recommendations and benefits/importance of continued mobility. Pt continues to benefit from skilled PT services to progress toward functional mobility goals.    Recommendations for follow up therapy are one component of a multi-disciplinary discharge planning process, led by the attending physician.  Recommendations may be updated based on patient status, additional functional criteria and insurance authorization.  Follow Up Recommendations  Home health PT     Assistance Recommended at Discharge Intermittent Supervision/Assistance  Patient can return home with the following Assistance with cooking/housework;Assist for transportation;Help with stairs or ramp for entrance   Equipment Recommendations  None recommended by PT    Recommendations for Other Services       Precautions / Restrictions Precautions Precautions: Fall Restrictions Weight Bearing Restrictions: No     Mobility  Bed Mobility Overal bed mobility: Needs Assistance             General bed mobility comments: pt sitting EOB on arrival     Transfers Overall transfer level: Needs assistance Equipment used: Rolling walker (2 wheels) Transfers: Sit to/from Stand, Bed to chair/wheelchair/BSC Sit to Stand: Min guard           General transfer comment: min guard for balance and safety    Ambulation/Gait Ambulation/Gait assistance: Min guard Gait Distance (Feet): 120 Feet Assistive device: Rolling walker (2 wheels) Gait Pattern/deviations: Step-through pattern, Decreased stride length, Trunk flexed Gait velocity: Decreased     General Gait Details: slow gait with RW secondary to fatigue and weakness, no LOB, pt able to self manage RW around obstacels without assist,   Stairs             Wheelchair Mobility    Modified Rankin (Stroke Patients Only)       Balance Overall balance assessment: Needs assistance   Sitting balance-Leahy Scale: Good     Standing balance support: No upper extremity supported, During functional activity Standing balance-Leahy Scale: Fair Standing balance comment: can stand and take steps without UE support; preference for HHA or RW                            Cognition Arousal/Alertness: Awake/alert Behavior During Therapy: WFL for tasks assessed/performed, Anxious Overall Cognitive Status: Within Functional Limits for tasks assessed                                 General Comments: some impulsivity with mobility, but receptive to cues and able to follow commands        Exercises General Exercises - Lower Extremity Long Arc Quad: AROM, Right, Left, 10  reps, Seated Hip ABduction/ADduction: AROM, Right, Left, 10 reps, Seated (long sitting) Hip Flexion/Marching: AROM, Right, Left, 20 reps, Seated    General Comments General comments (skin integrity, edema, etc.): VSS on RA      Pertinent Vitals/Pain Pain Assessment Pain Assessment: No/denies pain Pain Intervention(s): Monitored during session    Home Living                           Prior Function            PT Goals (current goals can now be found in the care plan section) Acute Rehab PT Goals PT Goal Formulation: With patient Time For Goal Achievement: 12/11/21    Frequency    Min 3X/week      PT Plan      Co-evaluation              AM-PAC PT "6 Clicks" Mobility   Outcome Measure  Help needed turning from your back to your side while in a flat bed without using bedrails?: None Help needed moving from lying on your back to sitting on the side of a flat bed without using bedrails?: A Little Help needed moving to and from a bed to a chair (including a wheelchair)?: A Little Help needed standing up from a chair using your arms (e.g., wheelchair or bedside chair)?: A Little Help needed to walk in hospital room?: A Little Help needed climbing 3-5 steps with a railing? : A Little 6 Click Score: 19    End of Session   Activity Tolerance: Patient tolerated treatment well;Patient limited by fatigue Patient left: with call bell/phone within reach;in chair;with chair alarm set;with nursing/sitter in room Nurse Communication: Mobility status PT Visit Diagnosis: Other abnormalities of gait and mobility (R26.89);Muscle weakness (generalized) (M62.81)     Time: 4097-3532 PT Time Calculation (min) (ACUTE ONLY): 22 min  Charges:  $Gait Training: 8-22 mins                     Honi Name R. PTA Acute Rehabilitation Services Office: [phone]    Catalina Antigua 12/03/2021, 10:29 AM

## 2021-12-03 NOTE — Progress Notes (Signed)
Mobility Specialist: Progress Note   12/03/21 1557  Mobility  Activity Ambulated with assistance in hallway  Level of Assistance Contact guard assist, steadying assist  Assistive Device Front wheel walker  Distance Ambulated (ft) 200 ft  Activity Response Tolerated well  $Mobility charge 1 Mobility   Pre-Mobility: 73 HR, 115/68 (84) BP, 100% SpO2 During Mobility: 109 HR Post-Mobility: 83 HR, 132/68 (87) BP, 98% SpO2  Received pt in bed having no complaints and agreeable to mobility. Pt was asymptomatic throughout ambulation and returned to room w/o fault. Left sitting EOB w/ call bell in reach and all needs met.  [facility] Meagen Limones Mobility Specialist Mobility Specialist [address]: [phone]

## 2021-12-05 ENCOUNTER — Encounter (HOSPITAL_COMMUNITY): Payer: Self-pay | Admitting: Emergency Medicine

## 2021-12-05 ENCOUNTER — Other Ambulatory Visit: Payer: Self-pay

## 2021-12-05 ENCOUNTER — Emergency Department (HOSPITAL_COMMUNITY): Payer: Medicare HMO

## 2021-12-05 ENCOUNTER — Telehealth: Payer: Self-pay | Admitting: Student

## 2021-12-05 ENCOUNTER — Observation Stay (HOSPITAL_COMMUNITY)
Admission: EM | Admit: 2021-12-05 | Discharge: 2021-12-07 | Disposition: A | Discharge status: Home / Self Care | Payer: Medicare HMO | Attending: Family Medicine | Admitting: Family Medicine

## 2021-12-05 DIAGNOSIS — I2510 Atherosclerotic heart disease of native coronary artery without angina pectoris: Secondary | ICD-10-CM | POA: Diagnosis not present

## 2021-12-05 DIAGNOSIS — E785 Hyperlipidemia, unspecified: Secondary | ICD-10-CM | POA: Diagnosis not present

## 2021-12-05 DIAGNOSIS — J9811 Atelectasis: Secondary | ICD-10-CM | POA: Diagnosis not present

## 2021-12-05 DIAGNOSIS — I6523 Occlusion and stenosis of bilateral carotid arteries: Secondary | ICD-10-CM | POA: Diagnosis not present

## 2021-12-05 DIAGNOSIS — E876 Hypokalemia: Secondary | ICD-10-CM | POA: Diagnosis not present

## 2021-12-05 DIAGNOSIS — J449 Chronic obstructive pulmonary disease, unspecified: Secondary | ICD-10-CM | POA: Diagnosis not present

## 2021-12-05 DIAGNOSIS — Z79899 Other long term (current) drug therapy: Secondary | ICD-10-CM | POA: Diagnosis not present

## 2021-12-05 DIAGNOSIS — R339 Retention of urine, unspecified: Secondary | ICD-10-CM | POA: Diagnosis present

## 2021-12-05 DIAGNOSIS — Z955 Presence of coronary angioplasty implant and graft: Secondary | ICD-10-CM

## 2021-12-05 DIAGNOSIS — R531 Weakness: Secondary | ICD-10-CM

## 2021-12-05 DIAGNOSIS — J438 Other emphysema: Secondary | ICD-10-CM

## 2021-12-05 DIAGNOSIS — N3080 Other cystitis without hematuria: Secondary | ICD-10-CM | POA: Diagnosis not present

## 2021-12-05 DIAGNOSIS — R0602 Shortness of breath: Secondary | ICD-10-CM | POA: Diagnosis not present

## 2021-12-05 DIAGNOSIS — R0600 Dyspnea, unspecified: Secondary | ICD-10-CM

## 2021-12-05 DIAGNOSIS — E039 Hypothyroidism, unspecified: Secondary | ICD-10-CM | POA: Diagnosis not present

## 2021-12-05 DIAGNOSIS — I10 Essential (primary) hypertension: Secondary | ICD-10-CM | POA: Diagnosis present

## 2021-12-05 DIAGNOSIS — E782 Mixed hyperlipidemia: Secondary | ICD-10-CM | POA: Diagnosis present

## 2021-12-05 DIAGNOSIS — I110 Hypertensive heart disease with heart failure: Secondary | ICD-10-CM | POA: Diagnosis not present

## 2021-12-05 DIAGNOSIS — I779 Disorder of arteries and arterioles, unspecified: Secondary | ICD-10-CM | POA: Diagnosis present

## 2021-12-05 DIAGNOSIS — Z951 Presence of aortocoronary bypass graft: Secondary | ICD-10-CM

## 2021-12-05 DIAGNOSIS — R5383 Other fatigue: Secondary | ICD-10-CM | POA: Insufficient documentation

## 2021-12-05 DIAGNOSIS — R3914 Feeling of incomplete bladder emptying: Secondary | ICD-10-CM | POA: Insufficient documentation

## 2021-12-05 DIAGNOSIS — Z7902 Long term (current) use of antithrombotics/antiplatelets: Secondary | ICD-10-CM | POA: Diagnosis not present

## 2021-12-05 DIAGNOSIS — J90 Pleural effusion, not elsewhere classified: Secondary | ICD-10-CM | POA: Diagnosis not present

## 2021-12-05 DIAGNOSIS — Z7901 Long term (current) use of anticoagulants: Secondary | ICD-10-CM | POA: Diagnosis not present

## 2021-12-05 DIAGNOSIS — I255 Ischemic cardiomyopathy: Secondary | ICD-10-CM | POA: Diagnosis present

## 2021-12-05 DIAGNOSIS — I5021 Acute systolic (congestive) heart failure: Secondary | ICD-10-CM | POA: Diagnosis present

## 2021-12-05 DIAGNOSIS — J45909 Unspecified asthma, uncomplicated: Secondary | ICD-10-CM | POA: Insufficient documentation

## 2021-12-05 DIAGNOSIS — D649 Anemia, unspecified: Secondary | ICD-10-CM | POA: Insufficient documentation

## 2021-12-05 DIAGNOSIS — I5023 Acute on chronic systolic (congestive) heart failure: Principal | ICD-10-CM | POA: Insufficient documentation

## 2021-12-05 LAB — TROPONIN I (HIGH SENSITIVITY)
Troponin I (High Sensitivity): 173 ng/L (ref <18)
Troponin I (High Sensitivity): 165 ng/L (ref <18)

## 2021-12-05 LAB — TYPE AND SCREEN
ABO/RH(D): O POS
Antibody Screen: NEGATIVE

## 2021-12-05 LAB — CBC WITH DIFFERENTIAL/PLATELET
Abs Immature Granulocytes: 0.21 10*3/uL — ABNORMAL HIGH (ref 0.00–0.07)
Basophils Absolute: 0.1 10*3/uL (ref 0.0–0.1)
Basophils Relative: 1 %
Eosinophils Absolute: 0.1 10*3/uL (ref 0.0–0.5)
Eosinophils Relative: 2 %
HCT: 28.2 % — ABNORMAL LOW (ref 36.0–46.0)
Hemoglobin: 8.7 g/dL — ABNORMAL LOW (ref 12.0–15.0)
Immature Granulocytes: 2 %
Lymphocytes Relative: 18 %
Lymphs Abs: 1.6 10*3/uL (ref 0.7–4.0)
MCH: 27.9 pg (ref 26.0–34.0)
MCHC: 30.9 g/dL (ref 30.0–36.0)
MCV: 90.4 fL (ref 80.0–100.0)
Monocytes Absolute: 0.8 10*3/uL (ref 0.1–1.0)
Monocytes Relative: 9 %
Neutro Abs: 6.1 10*3/uL (ref 1.7–7.7)
Neutrophils Relative %: 68 %
Platelets: 544 10*3/uL — ABNORMAL HIGH (ref 150–400)
RBC: 3.12 MIL/uL — ABNORMAL LOW (ref 3.87–5.11)
RDW: 16 % — ABNORMAL HIGH (ref 11.5–15.5)
WBC: 8.9 10*3/uL (ref 4.0–10.5)
nRBC: 0 % (ref 0.0–0.2)

## 2021-12-05 LAB — MAGNESIUM: Magnesium: 1.9 mg/dL (ref 1.7–2.4)

## 2021-12-05 LAB — BASIC METABOLIC PANEL
Anion gap: 6 (ref 5–15)
BUN: 9 mg/dL (ref 8–23)
CO2: 25 mmol/L (ref 22–32)
Calcium: 8.8 mg/dL — ABNORMAL LOW (ref 8.9–10.3)
Chloride: 109 mmol/L (ref 98–111)
Creatinine, Ser: 0.82 mg/dL (ref 0.44–1.00)
GFR, Estimated: >60 mL/min (ref >60)
Glucose, Bld: 189 mg/dL — ABNORMAL HIGH (ref 70–99)
Potassium: 3.2 mmol/L — ABNORMAL LOW (ref 3.5–5.1)
Sodium: 140 mmol/L (ref 135–145)

## 2021-12-05 LAB — TSH: TSH: 8.232 u[IU]/mL — ABNORMAL HIGH (ref 0.350–4.500)

## 2021-12-05 LAB — BRAIN NATRIURETIC PEPTIDE: B Natriuretic Peptide: 579 pg/mL — ABNORMAL HIGH (ref 0.0–100.0)

## 2021-12-05 LAB — D-DIMER, QUANTITATIVE: D-Dimer, Quant: 1.91 ug/mL-FEU — ABNORMAL HIGH (ref 0.00–0.50)

## 2021-12-05 MED ORDER — IPRATROPIUM-ALBUTEROL 0.5-2.5 (3) MG/3ML IN SOLN: 3.0000 mL | Freq: Three times a day (TID) | RESPIRATORY_TRACT | Status: DC

## 2021-12-05 MED ORDER — FAMOTIDINE 20 MG PO TABS
40.0000 mg | ORAL_TABLET | Freq: Every day | ORAL | Status: DC
  Administered 2021-12-05 – 2021-12-06 (×2): 40 mg via ORAL
  Filled 2021-12-05 (×2): qty 2

## 2021-12-05 MED ORDER — POTASSIUM CHLORIDE CRYS ER 20 MEQ PO TBCR
40.0000 meq | EXTENDED_RELEASE_TABLET | Freq: Once | ORAL | Status: AC
Start: 2021-12-05 — End: 2021-12-05
  Administered 2021-12-05: 40 meq via ORAL
  Filled 2021-12-05: qty 2

## 2021-12-05 MED ORDER — LEVOTHYROXINE SODIUM 88 MCG PO TABS
88.0000 ug | ORAL_TABLET | Freq: Every day | ORAL | Status: DC
  Administered 2021-12-06 – 2021-12-07 (×2): 88 ug via ORAL
  Filled 2021-12-05 (×2): qty 1

## 2021-12-05 MED ORDER — ATORVASTATIN CALCIUM 40 MG PO TABS
80.0000 mg | ORAL_TABLET | Freq: Every day | ORAL | Status: DC
  Administered 2021-12-05 – 2021-12-06 (×2): 80 mg via ORAL
  Filled 2021-12-05 (×2): qty 2

## 2021-12-05 MED ORDER — ONDANSETRON HCL 4 MG/2ML IJ SOLN: 4.0000 mg | Freq: Four times a day (QID) | INTRAMUSCULAR | Status: DC | PRN

## 2021-12-05 MED ORDER — HYDROCODONE-ACETAMINOPHEN 5-325 MG PO TABS
1.0000 | ORAL_TABLET | Freq: Four times a day (QID) | ORAL | Status: DC | PRN
  Administered 2021-12-05 – 2021-12-07 (×2): 1 via ORAL
  Filled 2021-12-05 (×2): qty 1

## 2021-12-05 MED ORDER — ISOSORBIDE MONONITRATE ER 60 MG PO TB24
30.0000 mg | ORAL_TABLET | Freq: Every day | ORAL | Status: DC
  Administered 2021-12-05 – 2021-12-06 (×2): 30 mg via ORAL
  Filled 2021-12-05 (×3): qty 1

## 2021-12-05 MED ORDER — TAMSULOSIN HCL 0.4 MG PO CAPS
0.4000 mg | ORAL_CAPSULE | Freq: Every day | ORAL | Status: DC
  Administered 2021-12-05 – 2021-12-06 (×2): 0.4 mg via ORAL
  Filled 2021-12-05 (×2): qty 1

## 2021-12-05 MED ORDER — EZETIMIBE 10 MG PO TABS
10.0000 mg | ORAL_TABLET | Freq: Every day | ORAL | Status: DC
  Administered 2021-12-05 – 2021-12-07 (×3): 10 mg via ORAL
  Filled 2021-12-05 (×3): qty 1

## 2021-12-05 MED ORDER — LORATADINE 10 MG PO TABS: 10.0000 mg | ORAL_TABLET | Freq: Every day | ORAL | Status: DC | PRN

## 2021-12-05 MED ORDER — CLOPIDOGREL BISULFATE 75 MG PO TABS
75.0000 mg | ORAL_TABLET | Freq: Every day | ORAL | Status: DC
  Administered 2021-12-05 – 2021-12-07 (×3): 75 mg via ORAL
  Filled 2021-12-05 (×3): qty 1

## 2021-12-05 MED ORDER — FENTANYL CITRATE PF 50 MCG/ML IJ SOSY: 12.5000 ug | PREFILLED_SYRINGE | INTRAMUSCULAR | Status: DC | PRN

## 2021-12-05 MED ORDER — IPRATROPIUM-ALBUTEROL 0.5-2.5 (3) MG/3ML IN SOLN: 3.0000 mL | RESPIRATORY_TRACT | Status: DC | PRN

## 2021-12-05 MED ORDER — ACETAMINOPHEN 650 MG RE SUPP: 650.0000 mg | Freq: Four times a day (QID) | RECTAL | Status: DC | PRN

## 2021-12-05 MED ORDER — BISACODYL 5 MG PO TBEC: 5.0000 mg | DELAYED_RELEASE_TABLET | Freq: Every day | ORAL | Status: DC | PRN

## 2021-12-05 MED ORDER — NITROGLYCERIN 0.4 MG SL SUBL: 0.4000 mg | SUBLINGUAL_TABLET | SUBLINGUAL | Status: DC | PRN

## 2021-12-05 MED ORDER — PANTOPRAZOLE SODIUM 40 MG PO TBEC
40.0000 mg | DELAYED_RELEASE_TABLET | Freq: Every day | ORAL | Status: DC
  Administered 2021-12-05 – 2021-12-07 (×3): 40 mg via ORAL
  Filled 2021-12-05 (×3): qty 1

## 2021-12-05 MED ORDER — POTASSIUM CHLORIDE CRYS ER 20 MEQ PO TBCR
40.0000 meq | EXTENDED_RELEASE_TABLET | Freq: Once | ORAL | Status: AC
  Administered 2021-12-05: 40 meq via ORAL
  Filled 2021-12-05: qty 2

## 2021-12-05 MED ORDER — ONDANSETRON HCL 4 MG PO TABS: 4.0000 mg | ORAL_TABLET | Freq: Four times a day (QID) | ORAL | Status: DC | PRN

## 2021-12-05 MED ORDER — FUROSEMIDE 10 MG/ML IJ SOLN
40.0000 mg | Freq: Once | INTRAMUSCULAR | Status: AC
Start: 2021-12-05 — End: 2021-12-05
  Administered 2021-12-05: 40 mg via INTRAVENOUS
  Filled 2021-12-05: qty 4

## 2021-12-05 MED ORDER — ACETAMINOPHEN 325 MG PO TABS
650.0000 mg | ORAL_TABLET | Freq: Four times a day (QID) | ORAL | Status: DC | PRN
  Administered 2021-12-06 – 2021-12-07 (×3): 650 mg via ORAL
  Filled 2021-12-05 (×3): qty 2

## 2021-12-05 MED ORDER — APIXABAN 2.5 MG PO TABS
2.5000 mg | ORAL_TABLET | Freq: Two times a day (BID) | ORAL | Status: DC
  Administered 2021-12-05 – 2021-12-07 (×5): 2.5 mg via ORAL
  Filled 2021-12-05 (×5): qty 1

## 2021-12-05 NOTE — Assessment & Plan Note (Addendum)
-   EF 25 to 30% - IV Lasix given with potassium supplement with good results.  Pt will now take lasix 40 mg po daily.

## 2021-12-05 NOTE — ED Notes (Signed)
Pt has a urine catheter bag attached to her R thigh in which she had placed before this visit. Foley catheter is patent

## 2021-12-05 NOTE — Assessment & Plan Note (Addendum)
-   Secondary to immobility and deconditioning, incentive spirometry ordered and PT evaluation and orders to ambulate patient up to chair and in room requested -outpatient PT has been arranged as recommended by PT

## 2021-12-05 NOTE — Assessment & Plan Note (Signed)
-   Stable no wheezing noted, bronchodilators as needed

## 2021-12-05 NOTE — Assessment & Plan Note (Signed)
LDL goal less than 70 Continue home atorvastatin 80 mg daily

## 2021-12-05 NOTE — Hospital Course (Signed)
81 year old female with multiple recent hospitalizations for coronary artery disease recently discharged on 12/03/2021 after being admitted for non-ST elevation MI that developed into an ST elevation without angina and taken for cardiac catheterization on 11/24/2021.  She has a history of ischemic cardiomyopathy with an EF of 25 to 30%.  During this recent hospitalization she had a drug-eluting stent placed.  She has an LV thrombus and she is fully anticoagulated with apixaban.  Patient reports that since being at home that she thought she was going to have 24/7 care takers to assist her but apparently has had very limited assistance.  She reports that she has become increasingly weak and reports she can barely stand and walk 2 steps without having to sit down.  She reports dyspnea and not able to catch breath.  She denies wheezing.  She denies cough and fever.  She presented to the emergency department due to dyspnea.  Patient reports that she was too weak to go home.  She cannot care for herself.  She does not have enough assistance at home.  She is agreeable to rehabilitation if possible.  Patient reports she did not take any of her medications today because she was coming to the hospital.  Patient denies chest pain.  Patient denies palpitations.  Patient denies hematuria.  Patient is being admitted for observation.  Cardiology is going to evaluate her as well while she is here.

## 2021-12-05 NOTE — ED Provider Notes (Signed)
[facility] EMERGENCY DEPARTMENT Provider Note   CSN: [id number] Arrival date & time: 12/05/21  1103     History  Chief Complaint  Patient presents with   Shortness of Breath    Debbie [name] is a 81 y.o. female.   Shortness of Breath   Patient has history of hypertension hyperlipidemia, CABG, coronary artery disease, hypothyroidism, tricuspid regurgitation, metabolic encephalopathy, weakness. Patient was recently admitted to the hospital on July 6 for a myocardial infarction.  Patient ended up having a cardiac catheterization procedure with stenting on July 8.  Patient states that she was having some trouble with her breathing as well as fatigue when she left the hospital but over the last several days it has gotten much worse.  She was just discharged from the hospital 2 days ago.  Patient feels short of breath whenever she walks around.  She is also feeling weak.  She does have some bruising from her catheterization procedure but has not noticed a significant change Home Medications Prior to Admission medications   Medication Sig Start Date End Date Taking? Authorizing Provider  acetaminophen (TYLENOL) 650 MG CR tablet Take 650 mg by mouth in the morning.    [provider]  apixaban (ELIQUIS) 2.5 MG TABS tablet Take 1 tablet (2.5 mg total) by mouth 2 (two) times daily. 11/28/21   Duke, Tami Lin, PA  atorvastatin (LIPITOR) 80 MG tablet Take 1 tablet (80 mg total) by mouth at bedtime. 01/03/19   Roxan Hockey, MD  baclofen (LIORESAL) 10 MG tablet Take 10 mg by mouth daily.    [provider]  cephALEXin (KEFLEX) 500 MG capsule Take 1 capsule (500 mg total) by mouth 3 (three) times daily for 10 days. To finish treating bladder infection 12/02/21 12/13/21  Alexis Frock, MD  clopidogrel (PLAVIX) 75 MG tablet Take 1 tablet (75 mg total) by mouth daily. 12/01/21   Duke, Tami Lin, PA  ezetimibe (ZETIA) 10 MG tablet Take 1 tablet (10 mg total) by mouth  daily. 11/29/21   Duke, Tami Lin, PA  famotidine (PEPCID) 20 MG tablet Take 40 mg by mouth at bedtime. 08/18/18   [provider]  furosemide (LASIX) 40 MG tablet Take 1 tablet (40 mg total) by mouth daily as needed (for weight gain of 3 lbs overnight or 5 lbs in 1 week.). 11/28/21   Duke, Tami Lin, PA  isosorbide mononitrate (IMDUR) 30 MG 24 hr tablet Take 1 tablet (30 mg total) by mouth daily. 11/16/19   Satira Sark, MD  levocetirizine (XYZAL) 5 MG tablet Take 5 mg by mouth every evening. Equate levocetirizine.    [provider]  levothyroxine (SYNTHROID) 88 MCG tablet Take 88 mcg by mouth daily. 09/04/18   [provider]  metoprolol succinate (TOPROL-XL) 25 MG 24 hr tablet Take 0.5 tablets (12.5 mg total) by mouth daily. 11/30/21   Duke, Tami Lin, PA  nitroGLYCERIN (NITROSTAT) 0.4 MG SL tablet Place 1 tablet (0.4 mg total) under the tongue every 5 (five) minutes x 3 doses as needed for chest pain (if no relief after 3rd dose, call 911 or proceed to the ED for an evaluation). 08/06/19   Verta Ellen., NP  ondansetron (ZOFRAN) 4 MG tablet Take 1 tablet (4 mg total) by mouth every 6 (six) hours as needed for nausea. 10/20/21   Manuella Ghazi, Pratik D, DO  pantoprazole (PROTONIX) 40 MG tablet Take 40 mg by mouth daily. 10/21/21   [provider]  potassium chloride (  KLOR-CON M) 10 MEQ tablet Take 2 tablets (20 mEq total) by mouth daily as needed (only on days she takes lasix.). 11/28/21   Duke, Roe Rutherford, PA  tamsulosin (FLOMAX) 0.4 MG CAPS capsule Take 0.4 mg by mouth.    [provider]  umeclidinium-vilanterol (ANORO ELLIPTA) 62.5-25 MCG/INH AEPB Inhale 1 puff into the lungs daily. Patient not taking: Reported on 10/18/2021 01/04/19   Shon Hale, MD  potassium chloride (MICRO-K) 10 MEQ CR capsule Take 20 mEq by mouth 2 (two) times daily.   11/28/21  [provider]      Allergies    Ivp dye [iodinated contrast media], Betadine  [povidone iodine], Codeine, Other, Penicillins, and Sulfa antibiotics    Review of Systems   Review of Systems  Respiratory:  Positive for shortness of breath.     Physical Exam Updated Vital Signs BP 127/61   Pulse 90   Temp 98.3 F (36.8 C) (Oral)   Resp (!) 21   Ht 1.575 m (5\' 2" )   Wt 55.3 kg   SpO2 100%   BMI 22.31 kg/m  Physical Exam Vitals and nursing note reviewed.  Constitutional:      Appearance: She is well-developed. She is not diaphoretic.  HENT:     Head: Normocephalic and atraumatic.     Right Ear: External ear normal.     Left Ear: External ear normal.  Eyes:     General: No scleral icterus.       Right eye: No discharge.        Left eye: No discharge.     Conjunctiva/sclera: Conjunctivae normal.  Neck:     Trachea: No tracheal deviation.  Cardiovascular:     Rate and Rhythm: Normal rate and regular rhythm.  Pulmonary:     Effort: Pulmonary effort is normal. No respiratory distress.     Breath sounds: Normal breath sounds. No stridor. No wheezing or rales.  Abdominal:     General: Bowel sounds are normal. There is no distension.     Palpations: Abdomen is soft.     Tenderness: There is no abdominal tenderness. There is no guarding or rebound.     Comments: Bruising noted lower abdomen, flank region on the right side  Musculoskeletal:        General: No tenderness or deformity.     Cervical back: Neck supple.  Skin:    General: Skin is warm and dry.     Coloration: Skin is pale.     Findings: No rash.  Neurological:     General: No focal deficit present.     Mental Status: She is alert.     Cranial Nerves: No cranial nerve deficit (no facial droop, extraocular movements intact, no slurred speech).     Sensory: No sensory deficit.     Motor: No abnormal muscle tone or seizure activity.     Coordination: Coordination normal.  Psychiatric:        Mood and Affect: Mood normal.     ED Results / Procedures / Treatments   Labs (all labs ordered  are listed, but only abnormal results are displayed) Labs Reviewed  CBC WITH DIFFERENTIAL/PLATELET - Abnormal; Notable for the following components:      Result Value   RBC 3.12 (*)    Hemoglobin 8.7 (*)    HCT 28.2 (*)    RDW 16.0 (*)    Platelets 544 (*)    Abs Immature Granulocytes 0.21 (*)    All other  components within normal limits  BASIC METABOLIC PANEL - Abnormal; Notable for the following components:   Potassium 3.2 (*)    Glucose, Bld 189 (*)    Calcium 8.8 (*)    All other components within normal limits  BRAIN NATRIURETIC PEPTIDE - Abnormal; Notable for the following components:   B Natriuretic Peptide 579.0 (*)    All other components within normal limits  D-DIMER, QUANTITATIVE - Abnormal; Notable for the following components:   D-Dimer, Quant 1.91 (*)    All other components within normal limits  TSH - Abnormal; Notable for the following components:   TSH 8.232 (*)    All other components within normal limits  BASIC METABOLIC PANEL - Abnormal; Notable for the following components:   Glucose, Bld 112 (*)    Calcium 8.5 (*)    All other components within normal limits  CBC - Abnormal; Notable for the following components:   RBC 2.89 (*)    Hemoglobin 8.2 (*)    HCT 26.1 (*)    RDW 16.2 (*)    Platelets 520 (*)    nRBC 0.3 (*)    All other components within normal limits  LIPID PANEL - Abnormal; Notable for the following components:   HDL 31 (*)    All other components within normal limits  BRAIN NATRIURETIC PEPTIDE - Abnormal; Notable for the following components:   B Natriuretic Peptide 415.0 (*)    All other components within normal limits  GLUCOSE, CAPILLARY - Abnormal; Notable for the following components:   Glucose-Capillary 118 (*)    All other components within normal limits  T4, FREE - Abnormal; Notable for the following components:   Free T4 1.48 (*)    All other components within normal limits  BASIC METABOLIC PANEL - Abnormal; Notable for the  following components:   Glucose, Bld 121 (*)    Calcium 8.8 (*)    All other components within normal limits  BRAIN NATRIURETIC PEPTIDE - Abnormal; Notable for the following components:   B Natriuretic Peptide 212.0 (*)    All other components within normal limits  TROPONIN I (HIGH SENSITIVITY) - Abnormal; Notable for the following components:   Troponin I (High Sensitivity) 173 (*)    All other components within normal limits  TROPONIN I (HIGH SENSITIVITY) - Abnormal; Notable for the following components:   Troponin I (High Sensitivity) 165 (*)    All other components within normal limits  MAGNESIUM  MAGNESIUM  MAGNESIUM  TYPE AND SCREEN    EKG None  Radiology No results found.  Procedures Procedures    Medications Ordered in ED Medications - No data to display  ED Course/ Medical Decision Making/ A&P Clinical Course as of 12/08/21 0847  Wed Dec 05, 2021  1315 Troponin I (High Sensitivity)(!!) Troponin elevated but significantly decreased from 11 days ago [JK]  1315 Basic metabolic panel(!) Hypokalemia noted [JK]  1318 CBC with Differential(!) Hemoglobin decreased but increased from a few days ago [JK]  1318 DG Chest Portable 1 View [JK]  1318 DG Chest Portable 1 View Bibasilar airspace opacities noted, pneumonia versus  [JK]  1337 Discussed with Dr Wyline Mood.  Reasonable to bring in for observation. [JK]  1358 Case discussed with Dr. Laural Benes.  Agrees with admission [JK]    Clinical Course User Index [JK] Linwood Dibbles, MD                           Medical  Decision Making Problems Addressed: Anemia, unspecified type: chronic illness or injury with exacerbation, progression, or side effects of treatment Dyspnea, unspecified type: acute illness or injury that poses a threat to life or bodily functions Other fatigue: acute illness or injury  Amount and/or Complexity of Data Reviewed Labs: ordered. Decision-making details documented in ED Course. Radiology: ordered  and independent interpretation performed. Decision-making details documented in ED Course.    Details: CXR with atelectasis vs infiltrate.  Doubt the latter, no infectious sx.  Risk Prescription drug management. Decision regarding hospitalization.   Pt presented with shortness of breath fatigue recent mi.  No signs of sever fluid overload.  Doubt acute infection.  Anemia noted but actually improving since discharge.  Suspect fatigue and dyspnea multifactorial.  May benefit from repeat echo.  Case discussed with cardiology and medical service for admisison.        Final Clinical Impression(s) / ED Diagnoses Final diagnoses:  Dyspnea, unspecified type  Other fatigue  Anemia, unspecified type    Rx / DC Orders ED Discharge Orders     None         Dorie Rank, MD 12/08/21 [phone]

## 2021-12-05 NOTE — Assessment & Plan Note (Addendum)
-   Oral replacement given and repleted.

## 2021-12-05 NOTE — Assessment & Plan Note (Addendum)
-   PT recommending outpatient PT which is arranged

## 2021-12-05 NOTE — Telephone Encounter (Signed)
Reports worsening SOB and dizziness. Denies chest pain. Has not checked BP this morning. Patient sounded SOB while on phone. Medications reviewed. Advised to go to the ED for an evaluation. Verbalized understanding of plan.

## 2021-12-05 NOTE — ED Notes (Signed)
Critical Lab:  Troponin 173.  Provider notified.

## 2021-12-05 NOTE — Assessment & Plan Note (Addendum)
--   resume home levothyroxine daily 

## 2021-12-05 NOTE — Assessment & Plan Note (Signed)
-   Patient remains on apixaban and Plavix with aspirin having being discontinued during last hospitalization.

## 2021-12-05 NOTE — Telephone Encounter (Signed)
Pt c/o Shortness Of Breath: STAT if SOB developed within the last 24 hours or pt is noticeably SOB on the phone  1. Are you currently SOB (can you hear that pt is SOB on the phone)? Yes; around 10 min ago  2. How long have you been experiencing SOB? Since Monday  3. Are you SOB when sitting or when up moving around? Moving around; sitting sometimes but not as bad  4. Are you currently experiencing any other symptoms? Real tired, lightheaded, dizziness

## 2021-12-05 NOTE — Assessment & Plan Note (Addendum)
-   cardiology has seen her and recommending she go back to bisoprolol 2.5 mg daily

## 2021-12-05 NOTE — Assessment & Plan Note (Addendum)
-   See recommendations noted, continue medical management, follow up with cardiology outpatient

## 2021-12-05 NOTE — ED Triage Notes (Signed)
Pt presents with SOB since being discharge from [facility] on Monday, PMH of CHF.

## 2021-12-05 NOTE — Assessment & Plan Note (Addendum)
-   Patient has significant coronary artery disease, continue all home cardiac medications

## 2021-12-05 NOTE — Assessment & Plan Note (Signed)
-   Appreciate cardiology consultation and recommendations

## 2021-12-05 NOTE — H&P (Signed)
History and Physical  [facility]  Debbie [name] SSN-[id number] DOB: 10/23/1940 DOA: 12/05/2021  PCP: Caryl Bis, MD  Patient coming from: Home  Level of care: Telemetry  I have personally briefly reviewed patient's old medical records in [place]  Chief Complaint: Dyspnea   HPI: Debbie [name] is a 81 year old female with multiple recent hospitalizations for coronary artery disease recently discharged on 12/03/2021 after being admitted for non-ST elevation MI that developed into an ST elevation without angina and taken for cardiac catheterization on 11/24/2021.  She has a history of ischemic cardiomyopathy with an EF of 25 to 30%.  During this recent hospitalization she had a drug-eluting stent placed.  She has an LV thrombus and she is fully anticoagulated with apixaban.  Patient reports that since being at home that she thought she was going to have 24/7 care takers to assist her but apparently has had very limited assistance.  She reports that she has become increasingly weak and reports she can barely stand and walk 2 steps without having to sit down.  She reports dyspnea and not able to catch breath.  She denies wheezing.  She denies cough and fever.  She presented to the emergency department due to dyspnea.  Patient reports that she was too weak to go home.  She cannot care for herself.  She does not have enough assistance at home.  She is agreeable to rehabilitation if possible.  Patient reports she did not take any of her medications today because she was coming to the hospital.  Patient denies chest pain.  Patient denies palpitations.  Patient denies hematuria.  Patient is being admitted for observation.  Cardiology is going to evaluate her as well while she is here.  Review of Systems: Review of Systems  Constitutional:  Positive for malaise/fatigue and weight loss.  HENT: Negative.    Eyes: Negative.   Respiratory:  Positive for shortness of breath.  Negative for cough, hemoptysis, sputum production and wheezing.   Cardiovascular:  Positive for leg swelling. Negative for chest pain and palpitations.  Gastrointestinal:  Negative for abdominal pain, heartburn, nausea and vomiting.  Genitourinary: Negative.   Musculoskeletal:  Positive for falls, joint pain and myalgias.  Neurological:  Positive for weakness. Negative for tingling, tremors, sensory change, speech change, focal weakness, seizures, loss of consciousness and headaches.  Endo/Heme/Allergies: Negative.   Psychiatric/Behavioral: Negative.    All other systems reviewed and are negative.    Past Medical History:  Diagnosis Date   Allergic rhinitis    Anxiety    Arthritis    Asthma    Bladder spasms    Carotid artery disease (HCC)    Collagen vascular disease (HCC)    COPD (chronic obstructive pulmonary disease) ([place])    Cystocele    Essential hypertension    GERD (gastroesophageal reflux disease)    Hyperlipidemia    Hypothyroidism    Ischemic cardiomyopathy 12/02/2021   Echo 11/27/2021:     Multiple vessel coronary artery disease => CABG (LIMA-dLAD, SVG-Diag, Seq SVG-OM-OM-PDA)    CABG 2006 [place] //   dobutamine nuclear 2007, no scar or ischemia, EF 65%  //   Lexiscan Myoview February, 2011, breast attenuation, EF 65%, no ischemia. => NSTEMI 11/23/2021   NSTEMI (non-ST elevated myocardial infarction) ([place]) 11/22/2021   Osteoporosis    Renal insufficiency    Vaginal vault prolapse     Past Surgical History:  Procedure Laterality Date   ANTERIOR AND POSTERIOR REPAIR  N/A 04/06/2013   Procedure: CYSTOCELE REPAIR ;  Surgeon: Martina Sinner, MD;  Location: [facility] ORS;  Service: Urology;  Laterality: N/A;   CATARACTS     REMOVED   COLONOSCOPY     CORNEA LACERATION REPAIR     CORONARY ARTERY BYPASS GRAFT     X 5 VESSELS   CORONARY STENT INTERVENTION N/A 11/23/2021   Procedure: CORONARY STENT INTERVENTION;  Surgeon: Tonny Bollman, MD;  Location: [facility] INVASIVE CV  LAB;  Service: Cardiovascular;  Laterality: N/A;   CORONARY/GRAFT ACUTE MI REVASCULARIZATION N/A 11/24/2021   Procedure: Coronary/Graft Acute MI Revascularization;  Surgeon: Runell Gess, MD;  Location: [facility] INVASIVE CV LAB;  Service: Cardiovascular;  Laterality: N/A;   CYSTOSCOPY N/A 04/06/2013   Procedure: CYSTOSCOPY;  Surgeon: Martina Sinner, MD;  Location: [facility] ORS;  Service: Urology;  Laterality: N/A;   HERNIA REPAIR     LEFT HEART CATH AND CORONARY ANGIOGRAPHY N/A 11/24/2021   Procedure: LEFT HEART CATH AND CORONARY ANGIOGRAPHY;  Surgeon: Runell Gess, MD;  Location: [facility] INVASIVE CV LAB;  Service: Cardiovascular;  Laterality: N/A;   LEFT HEART CATH AND CORS/GRAFTS ANGIOGRAPHY N/A 11/23/2021   Procedure: LEFT HEART CATH AND CORS/GRAFTS ANGIOGRAPHY;  Surgeon: Tonny Bollman, MD;  Location: [facility] INVASIVE CV LAB;  Service: Cardiovascular;  Laterality: N/A;   SEPTOPLASTY N/A 04/28/2015   Procedure: SEPTOPLASTY;  Surgeon: Melvenia Beam, MD;  Location: [facility] OR;  Service: ENT;  Laterality: N/A;   SINUS ENDO W/FUSION Bilateral 04/28/2015   Procedure: ENDOSCOPIC SINUS SURGERY WITH NAVIGATION;  Surgeon: Melvenia Beam, MD;  Location: [facility] OR;  Service: ENT;  Laterality: Bilateral;   TUBAL LIGATION       reports that she has never smoked. She has never used smokeless tobacco. She reports current alcohol use. She reports that she does not use drugs.  Allergies  Allergen Reactions   Ivp Dye [Iodinated Contrast Media] Swelling    Kidney Dye   Betadine [Povidone Iodine] Rash   Codeine Nausea And Vomiting   Other Nausea And Vomiting    Narcotics - nausea,vomiting   Penicillins Rash    Did it involve swelling of the face/tongue/throat, SOB, or low BP? No Did it involve sudden or severe rash/hives, skin peeling, or any reaction on the inside of your mouth or nose? No Did you need to seek medical attention at a hospital or doctor's office? No When did it last happen?       If all above answers are "NO",  may proceed with cephalosporin use.    Sulfa Antibiotics Rash    Family History  Problem Relation Age of Onset   Cirrhosis Father    Lung disease Father    Diabetes Mellitus II Mother    Heart Problems Mother     Prior to Admission medications   Medication Sig Start Date End Date Taking? Authorizing Provider  apixaban (ELIQUIS) 2.5 MG TABS tablet Take 1 tablet (2.5 mg total) by mouth 2 (two) times daily. 11/28/21  Yes Duke, Roe Rutherford, PA  atorvastatin (LIPITOR) 80 MG tablet Take 1 tablet (80 mg total) by mouth at bedtime. 01/03/19  Yes Emokpae, Courage, MD  clopidogrel (PLAVIX) 75 MG tablet Take 1 tablet (75 mg total) by mouth daily. 12/01/21  Yes Duke, Roe Rutherford, PA  ezetimibe (ZETIA) 10 MG tablet Take 1 tablet (10 mg total) by mouth daily. 11/29/21  Yes Duke, Roe Rutherford, PA  famotidine (PEPCID) 20 MG tablet Take 40 mg by mouth at bedtime. 08/18/18  Yes  [provider]  furosemide (LASIX) 40 MG tablet Take 1 tablet (40 mg total) by mouth daily as needed (for weight gain of 3 lbs overnight or 5 lbs in 1 week.). 11/28/21  Yes Duke, Roe Rutherford, PA  isosorbide mononitrate (IMDUR) 30 MG 24 hr tablet Take 1 tablet (30 mg total) by mouth daily. 11/16/19  Yes Jonelle Sidle, MD  levocetirizine (XYZAL) 5 MG tablet Take 5 mg by mouth every evening. Equate levocetirizine.   Yes [provider]  levothyroxine (SYNTHROID) 88 MCG tablet Take 88 mcg by mouth daily. 09/04/18  Yes [provider]  metoprolol succinate (TOPROL-XL) 25 MG 24 hr tablet Take 0.5 tablets (12.5 mg total) by mouth daily. 11/30/21  Yes Duke, Roe Rutherford, PA  nitroGLYCERIN (NITROSTAT) 0.4 MG SL tablet Place 1 tablet (0.4 mg total) under the tongue every 5 (five) minutes x 3 doses as needed for chest pain (if no relief after 3rd dose, call 911 or proceed to the ED for an evaluation). 08/06/19  Yes Netta Neat., NP  ondansetron (ZOFRAN) 4 MG tablet Take 1 tablet (4 mg total) by mouth every 6  (six) hours as needed for nausea. 10/20/21  Yes Shah, Pratik D, DO  pantoprazole (PROTONIX) 40 MG tablet Take 40 mg by mouth daily. 10/21/21  Yes [provider]  potassium chloride (KLOR-CON M) 10 MEQ tablet Take 2 tablets (20 mEq total) by mouth daily as needed (only on days she takes lasix.). 11/28/21  Yes Duke, Roe Rutherford, PA  tamsulosin (FLOMAX) 0.4 MG CAPS capsule Take 0.4 mg by mouth.   Yes [provider]  acetaminophen (TYLENOL) 650 MG CR tablet Take 650 mg by mouth in the morning.    [provider]  umeclidinium-vilanterol (ANORO ELLIPTA) 62.5-25 MCG/INH AEPB Inhale 1 puff into the lungs daily. Patient not taking: Reported on 10/18/2021 01/04/19   Shon Hale, MD  potassium chloride (MICRO-K) 10 MEQ CR capsule Take 20 mEq by mouth 2 (two) times daily.   11/28/21  [provider]    Physical Exam: Vitals:   12/05/21 1200 12/05/21 1230 12/05/21 1300 12/05/21 1330  BP: 130/62 121/72 123/62 131/71  Pulse: 84 85 86 89  Resp: (!) 25 (!) 23 (!) 21 17  Temp:      TempSrc:      SpO2: 100% 100% 100% 100%  Weight:      Height:        Constitutional: extremely frail and weak female, alert, oriented x 3, NAD, calm, comfortable Eyes: PERRL, lids and conjunctivae normal ENMT: Mucous membranes are moist. Posterior pharynx clear of any exudate or lesions.Normal dentition.  Neck: normal, supple, no masses, no thyromegaly Respiratory: clear to auscultation bilaterally, no increased work of breathing.  Cardiovascular: normal s1, s2 sounds, no murmurs / rubs / gallops. No extremity edema. 2+ pedal pulses. No carotid bruits.  Abdomen: no tenderness, no masses palpated. No hepatosplenomegaly. Bowel sounds positive.  Musculoskeletal: trace pretibial edema BLEs.  no clubbing / cyanosis. No joint deformity upper and lower extremities. Good ROM, no contractures. Normal muscle tone.  Skin: no rashes, lesions, ulcers. No induration Neurologic: CN 2-12 grossly intact.  Sensation intact, DTR normal. Strength 5/5 in all 4.  Psychiatric: Normal judgment and insight. Alert and oriented x 3. Normal mood.   Labs on Admission: I have personally reviewed following labs and imaging studies  CBC: Recent Labs  Lab 11/30/21 0303 12/01/21 0047 12/02/21 0251 12/03/21 0253 12/05/21 1154  WBC 13.2* 8.4 10.9*  7.5 8.9  NEUTROABS  --   --   --   --  6.1  HGB 8.8* 8.1* 8.0* 7.9* 8.7*  HCT 26.9* 25.2* 24.4* 24.8* 28.2*  MCV 86.2 87.8 86.5 88.3 90.4  PLT 356 371 479* 496* 544*   Basic Metabolic Panel: Recent Labs  Lab 11/29/21 0606 12/02/21 0251 12/03/21 0253 12/05/21 1154  NA 142 137 137 140  K 3.6 3.0* 4.1 3.2*  CL 107 108 110 109  CO2 21* 20* 24 25  GLUCOSE 161* 99 97 189*  BUN 14 19 13 9   CREATININE 1.02* 0.98 0.88 0.82  CALCIUM 9.3 8.2* 8.3* 8.8*  MG  --   --  2.2 1.9   GFR: Estimated Creatinine Clearance: 43.3 mL/min (by C-G formula based on SCr of 0.82 mg/dL). Liver Function Tests: No results for input(s): "AST", "ALT", "ALKPHOS", "BILITOT", "PROT", "ALBUMIN" in the last 168 hours. No results for input(s): "LIPASE", "AMYLASE" in the last 168 hours. No results for input(s): "AMMONIA" in the last 168 hours. Coagulation Profile: No results for input(s): "INR", "PROTIME" in the last 168 hours. Cardiac Enzymes: No results for input(s): "CKTOTAL", "CKMB", "CKMBINDEX", "TROPONINI" in the last 168 hours. BNP (last 3 results) No results for input(s): "PROBNP" in the last 8760 hours. HbA1C: No results for input(s): "HGBA1C" in the last 72 hours. CBG: No results for input(s): "GLUCAP" in the last 168 hours. Lipid Profile: No results for input(s): "CHOL", "HDL", "LDLCALC", "TRIG", "CHOLHDL", "LDLDIRECT" in the last 72 hours. Thyroid Function Tests: No results for input(s): "TSH", "T4TOTAL", "FREET4", "T3FREE", "THYROIDAB" in the last 72 hours. Anemia Panel: No results for input(s): "VITAMINB12", "FOLATE", "FERRITIN", "TIBC", "IRON", "RETICCTPCT" in  the last 72 hours. Urine analysis:    Component Value Date/Time   COLORURINE YELLOW 10/18/2021 1124   APPEARANCEUR CLEAR 10/18/2021 1124   APPEARANCEUR Cloudy (A) 03/14/2021 1355   LABSPEC 1.009 10/18/2021 1124   PHURINE 6.0 10/18/2021 1124   GLUCOSEU NEGATIVE 10/18/2021 1124   HGBUR SMALL (A) 10/18/2021 1124   BILIRUBINUR NEGATIVE 10/18/2021 1124   BILIRUBINUR Negative 03/14/2021 1355   KETONESUR NEGATIVE 10/18/2021 1124   PROTEINUR NEGATIVE 10/18/2021 1124   UROBILINOGEN 0.2 09/20/2014 1653   NITRITE NEGATIVE 10/18/2021 1124   LEUKOCYTESUR NEGATIVE 10/18/2021 1124    Radiological Exams on Admission: DG Chest Portable 1 View  Result Date: 12/05/2021 CLINICAL DATA:  81 year old female with dyspnea EXAM: PORTABLE CHEST 1 VIEW COMPARISON:  Radiographs 11/22/2021 FINDINGS: Stable heart size and mediastinal contours status post median sternotomy. Aortic atherosclerosis. Patchy opacities in the left-greater-than-right lung bases. Small left pleural effusion. No pneumothorax. IMPRESSION: 1. Left-greater-than-right basilar airspace opacities are presumed atelectasis though pneumonia is difficult to exclude. Small left pleural effusion. 2. Aortic atherosclerosis post median sternotomy. Electronically Signed   By: Minerva Fester M.D.   On: 12/05/2021 12:23    EKG: Independently reviewed. No acute ST T wave changes seen.   Assessment/Plan Principal Problem:   Generalized weakness Active Problems:   Multiple vessel coronary artery disease => CABG (LIMA-dLAD, SVG-Diag, Seq SVG-OM-OM-PDA)   Essential hypertension   Hypothyroidism   Hyperlipidemia LDL goal <70   Hx of CABG x 5   Carotid artery disease (HCC)   Hypokalemia   COPD (chronic obstructive pulmonary disease) (HCC)   Incomplete bladder emptying   Status post coronary artery stent placement   Ischemic cardiomyopathy   Atelectasis   Assessment and Plan: * Generalized weakness - Patient unable to manage at home, severely  deconditioned and frail - PT evaluation  and recommendation likely to SNF  Atelectasis - Secondary to immobility and deconditioning, incentive spirometry ordered and PT evaluation and orders to ambulate patient up to chair and in room requested - Anticipate need for SNF placement  Ischemic cardiomyopathy - EF 25 to 30% -IV Lasix given in the ED with potassium supplement  Status post coronary artery stent placement - Patient remains on apixaban and Plavix with aspirin having being discontinued during last hospitalization.  COPD (chronic obstructive pulmonary disease) (HCC) - Stable no wheezing noted, bronchodilators as needed  Hypokalemia - Oral replacement given, magnesium monitored as well and supplemented as needed -Recheck BMP in the morning  Carotid artery disease ([place]) - See recommendations noted, continue medical management  Hx of CABG x 5 - Patient has significant coronary artery disease, continue all home cardiac medications with the exception of holding metoprolol based on cardiology recommendations for now  Hyperlipidemia LDL goal <70 LDL goal less than 70 Continue home atorvastatin 80 mg daily  Hypothyroidism - Follow-up TSH, resume home levothyroxine daily.  Essential hypertension - Very well controlled, for now, given acute dyspnea symptoms, cardiology recommended temporarily holding metoprolol  Multiple vessel coronary artery disease => CABG (LIMA-dLAD, SVG-Diag, Seq SVG-OM-OM-PDA) - Appreciate cardiology consultation and recommendations   DVT prophylaxis: apixaban   Code Status: Full   Family Communication: patient updated at bedside   Disposition Plan: anticipate SNF   Consults called: cardiology  Admission status: OBS  Level of care: Telemetry Irwin Brakeman MD [facility] How to contact the [facility] Attending or Consulting provider [place] or covering provider during after hours [place], for this patient?  Check the care team in [facility] and look for  a) attending/consulting TRH provider listed and b) the [facility] team listed Log into [email or web address] and use [place]'s universal password to access. If you do not have the password, please contact the hospital operator. Locate the [facility] provider you are looking for under [facility] and page to a number that you can be directly reached. If you still have difficulty reaching the provider, please page the [facility] (Director on Call) for the Hospitalists listed on amion for assistance.   If 7PM-7AM, please contact night-coverage [email or web address] Password [facility]  12/05/2021, 2:37 PM

## 2021-12-06 DIAGNOSIS — N3080 Other cystitis without hematuria: Secondary | ICD-10-CM | POA: Diagnosis not present

## 2021-12-06 DIAGNOSIS — I5023 Acute on chronic systolic (congestive) heart failure: Secondary | ICD-10-CM

## 2021-12-06 DIAGNOSIS — I10 Essential (primary) hypertension: Secondary | ICD-10-CM | POA: Diagnosis not present

## 2021-12-06 DIAGNOSIS — J438 Other emphysema: Secondary | ICD-10-CM | POA: Diagnosis not present

## 2021-12-06 DIAGNOSIS — I5021 Acute systolic (congestive) heart failure: Secondary | ICD-10-CM | POA: Diagnosis not present

## 2021-12-06 DIAGNOSIS — I6523 Occlusion and stenosis of bilateral carotid arteries: Secondary | ICD-10-CM | POA: Diagnosis not present

## 2021-12-06 DIAGNOSIS — Z951 Presence of aortocoronary bypass graft: Secondary | ICD-10-CM | POA: Diagnosis not present

## 2021-12-06 DIAGNOSIS — J9811 Atelectasis: Secondary | ICD-10-CM | POA: Diagnosis not present

## 2021-12-06 DIAGNOSIS — R531 Weakness: Secondary | ICD-10-CM | POA: Diagnosis not present

## 2021-12-06 DIAGNOSIS — E785 Hyperlipidemia, unspecified: Secondary | ICD-10-CM | POA: Diagnosis not present

## 2021-12-06 LAB — LIPID PANEL
Cholesterol: 123 mg/dL (ref 0–200)
HDL: 31 mg/dL — ABNORMAL LOW (ref >40)
LDL Cholesterol: 67 mg/dL (ref 0–99)
Total CHOL/HDL Ratio: 4 RATIO
Triglycerides: 125 mg/dL (ref <150)
VLDL: 25 mg/dL (ref 0–40)

## 2021-12-06 LAB — BASIC METABOLIC PANEL
Anion gap: 6 (ref 5–15)
BUN: 8 mg/dL (ref 8–23)
CO2: 26 mmol/L (ref 22–32)
Calcium: 8.5 mg/dL — ABNORMAL LOW (ref 8.9–10.3)
Chloride: 107 mmol/L (ref 98–111)
Creatinine, Ser: 0.76 mg/dL (ref 0.44–1.00)
GFR, Estimated: >60 mL/min (ref >60)
Glucose, Bld: 112 mg/dL — ABNORMAL HIGH (ref 70–99)
Potassium: 4.1 mmol/L (ref 3.5–5.1)
Sodium: 139 mmol/L (ref 135–145)

## 2021-12-06 LAB — CBC
HCT: 26.1 % — ABNORMAL LOW (ref 36.0–46.0)
Hemoglobin: 8.2 g/dL — ABNORMAL LOW (ref 12.0–15.0)
MCH: 28.4 pg (ref 26.0–34.0)
MCHC: 31.4 g/dL (ref 30.0–36.0)
MCV: 90.3 fL (ref 80.0–100.0)
Platelets: 520 10*3/uL — ABNORMAL HIGH (ref 150–400)
RBC: 2.89 MIL/uL — ABNORMAL LOW (ref 3.87–5.11)
RDW: 16.2 % — ABNORMAL HIGH (ref 11.5–15.5)
WBC: 7.8 10*3/uL (ref 4.0–10.5)
nRBC: 0.3 % — ABNORMAL HIGH (ref 0.0–0.2)

## 2021-12-06 LAB — GLUCOSE, CAPILLARY: Glucose-Capillary: 118 mg/dL — ABNORMAL HIGH (ref 70–99)

## 2021-12-06 LAB — BRAIN NATRIURETIC PEPTIDE: B Natriuretic Peptide: 415 pg/mL — ABNORMAL HIGH (ref 0.0–100.0)

## 2021-12-06 LAB — MAGNESIUM: Magnesium: 1.9 mg/dL (ref 1.7–2.4)

## 2021-12-06 LAB — T4, FREE: Free T4: 1.48 ng/dL — ABNORMAL HIGH (ref 0.61–1.12)

## 2021-12-06 MED ORDER — CEPHALEXIN 500 MG PO CAPS
500.0000 mg | ORAL_CAPSULE | Freq: Two times a day (BID) | ORAL | Status: DC
Start: 2021-12-06 — End: 2021-12-07
  Administered 2021-12-06 – 2021-12-07 (×3): 500 mg via ORAL
  Filled 2021-12-06 (×3): qty 1

## 2021-12-06 MED ORDER — SACCHAROMYCES BOULARDII 250 MG PO CAPS
250.0000 mg | ORAL_CAPSULE | Freq: Two times a day (BID) | ORAL | Status: DC
  Administered 2021-12-06 – 2021-12-07 (×3): 250 mg via ORAL
  Filled 2021-12-06 (×3): qty 1

## 2021-12-06 MED ORDER — CHLORHEXIDINE GLUCONATE CLOTH 2 % EX PADS
6.0000 | MEDICATED_PAD | Freq: Every day | CUTANEOUS | Status: DC
  Administered 2021-12-06 – 2021-12-07 (×2): 6 via TOPICAL

## 2021-12-06 MED ORDER — METOPROLOL SUCCINATE ER 25 MG PO TB24
12.5000 mg | ORAL_TABLET | Freq: Every day | ORAL | Status: DC
  Administered 2021-12-06: 12.5 mg via ORAL
  Filled 2021-12-06: qty 1

## 2021-12-06 MED ORDER — FUROSEMIDE 10 MG/ML IJ SOLN
40.0000 mg | Freq: Once | INTRAMUSCULAR | Status: AC
  Administered 2021-12-06: 40 mg via INTRAVENOUS
  Filled 2021-12-06: qty 4

## 2021-12-06 MED ORDER — BISOPROLOL FUMARATE 5 MG PO TABS
2.5000 mg | ORAL_TABLET | Freq: Every day | ORAL | Status: DC
  Filled 2021-12-06: qty 1

## 2021-12-06 NOTE — Progress Notes (Signed)
PROGRESS NOTE   Debbie [name]  SSN-[id number] DOB: December 12, 1940 DOA: 12/05/2021 PCP: Caryl Bis, MD   Chief Complaint  Patient presents with   Shortness of Breath   Level of care: Telemetry  Brief Admission History:  81 year old female with multiple recent hospitalizations for coronary artery disease recently discharged on 12/03/2021 after being admitted for non-ST elevation MI that developed into an ST elevation without angina and taken for cardiac catheterization on 11/24/2021.  She has a history of ischemic cardiomyopathy with an EF of 25 to 30%.  During this recent hospitalization she had a drug-eluting stent placed.  She has an LV thrombus and she is fully anticoagulated with apixaban.  Patient reports that since being at home that she thought she was going to have 24/7 care takers to assist her but apparently has had very limited assistance.  She reports that she has become increasingly weak and reports she can barely stand and walk 2 steps without having to sit down.  She reports dyspnea and not able to catch breath.  She denies wheezing.  She denies cough and fever.  She presented to the emergency department due to dyspnea.  Patient reports that she was too weak to go home.  She cannot care for herself.  She does not have enough assistance at home.  She is agreeable to rehabilitation if possible.  Patient reports she did not take any of her medications today because she was coming to the hospital.  Patient denies chest pain.  Patient denies palpitations.  Patient denies hematuria.  Patient is being admitted for observation.  Cardiology is going to evaluate her as well while she is here.   Assessment and Plan: * Generalized weakness - Patient unable to manage at home, severely deconditioned and frail - PT evaluation and recommendation likely to SNF  Emphysematous cystitis - pt completing a 10 day course of oral cephalexin as recommended by urologist that saw her during last  hospitalization  Atelectasis - Secondary to immobility and deconditioning, incentive spirometry ordered and PT evaluation and orders to ambulate patient up to chair and in room requested  Ischemic cardiomyopathy - EF 25 to 30% - IV Lasix given with potassium supplement  Status post coronary artery stent placement - Patient remains on apixaban and Plavix with aspirin having being discontinued during last hospitalization.  COPD (chronic obstructive pulmonary disease) (HCC) - Stable no wheezing noted, bronchodilators as needed  Hypokalemia - Oral replacement given and repleted.   Carotid artery disease ([place]) - See recommendations noted, continue medical management  Hx of CABG x 5 - Patient has significant coronary artery disease, continue all home cardiac medications  Hyperlipidemia LDL goal <70 LDL goal less than 70 Continue home atorvastatin 80 mg daily  Hypothyroidism - Follow-up TSH, resume home levothyroxine daily.  Essential hypertension - cardiology has seen her and recommending she go back to bisoprolol for now  Multiple vessel coronary artery disease => CABG (LIMA-dLAD, SVG-Diag, Seq SVG-OM-OM-PDA) - Appreciate cardiology consultation and recommendations   DVT prophylaxis: apixaban  Code Status: full  Family Communication:  Disposition: Status is: Observation The patient remains OBS appropriate and will d/c before 2 midnights.   Consultants:  cardiology Procedures:   Antimicrobials:    Subjective: Pt says she feels better than yesterday.  She is still very weak.  Objective: Vitals:   12/05/21 2352 12/06/21 0342 12/06/21 0608 12/06/21 0801  BP: (!) 91/51 103/65  112/61  Pulse: 84 91  81  Resp: 20 16  16  Temp: (!) 97.4 F (36.3 C) 98.4 F (36.9 C)  98.4 F (36.9 C)  TempSrc: Oral   Oral  SpO2: 96% 96%  97%  Weight:   56.6 kg   Height:        Intake/Output Summary (Last 24 hours) at 12/06/2021 1232 Last data filed at 12/06/2021 0900 Gross per 24  hour  Intake 600 ml  Output 2825 ml  Net -2225 ml   Filed Weights   12/05/21 1111 12/05/21 1929 12/06/21 0608  Weight: 55.3 kg 57.7 kg 56.6 kg   Examination:  General exam: frail, elderly female, awake, alert, NAD, Appears calm and comfortable  Respiratory system: shallow BS, rare crackles on left Respiratory effort normal. Cardiovascular system: normal S1 & S2 heard. No JVD, murmurs, rubs, gallops or clicks. No pedal edema. Gastrointestinal system: Abdomen is nondistended, soft and nontender. No organomegaly or masses felt. Normal bowel sounds heard. Central nervous system: Alert and oriented. No focal neurological deficits. Extremities: Symmetric 5 x 5 power. Skin: No rashes, lesions or ulcers. Psychiatry: Judgement and insight appear normal. Mood & affect appropriate.   Data Reviewed: I have personally reviewed following labs and imaging studies  CBC: Recent Labs  Lab 12/01/21 0047 12/02/21 0251 12/03/21 0253 12/05/21 1154 12/06/21 0527  WBC 8.4 10.9* 7.5 8.9 7.8  NEUTROABS  --   --   --  6.1  --   HGB 8.1* 8.0* 7.9* 8.7* 8.2*  HCT 25.2* 24.4* 24.8* 28.2* 26.1*  MCV 87.8 86.5 88.3 90.4 90.3  PLT 371 479* 496* 544* 520*    Basic Metabolic Panel: Recent Labs  Lab 12/02/21 0251 12/03/21 0253 12/05/21 1154 12/06/21 0527  NA 137 137 140 139  K 3.0* 4.1 3.2* 4.1  CL 108 110 109 107  CO2 20* 24 25 26   GLUCOSE 99 97 189* 112*  BUN 19 13 9 8   CREATININE 0.98 0.88 0.82 0.76  CALCIUM 8.2* 8.3* 8.8* 8.5*  MG  --  2.2 1.9 1.9    CBG: Recent Labs  Lab 12/06/21 0710  GLUCAP 118*    Recent Results (from the past 240 hour(s))  Urine Culture     Status: Abnormal   Collection Time: 11/30/21  8:41 PM   Specimen: Urine, Clean Catch  Result Value Ref Range Status   Specimen Description URINE, CLEAN CATCH  Final   Special Requests   Final    NONE Performed at [facility], [address] [place], [address] [place]    Culture MULTIPLE SPECIES PRESENT,  SUGGEST RECOLLECTION (A)  Final   Report Status 12/02/2021 FINAL  Final     Radiology Studies: DG Chest Portable 1 View  Result Date: 12/05/2021 CLINICAL DATA:  81 year old female with dyspnea EXAM: PORTABLE CHEST 1 VIEW COMPARISON:  Radiographs 11/22/2021 FINDINGS: Stable heart size and mediastinal contours status post median sternotomy. Aortic atherosclerosis. Patchy opacities in the left-greater-than-right lung bases. Small left pleural effusion. No pneumothorax. IMPRESSION: 1. Left-greater-than-right basilar airspace opacities are presumed atelectasis though pneumonia is difficult to exclude. Small left pleural effusion. 2. Aortic atherosclerosis post median sternotomy. Electronically Signed   By: 96 M.D.   On: 12/05/2021 12:23    Scheduled Meds:  apixaban  2.5 mg Oral BID   atorvastatin  80 mg Oral QHS   [START ON 12/07/2021] bisoprolol  2.5 mg Oral Daily   cephALEXin  500 mg Oral Q12H   clopidogrel  75 mg Oral Daily   ezetimibe  10 mg Oral Daily  famotidine  40 mg Oral QHS   isosorbide mononitrate  30 mg Oral Daily   levothyroxine  88 mcg Oral Q0600   pantoprazole  40 mg Oral Daily   saccharomyces boulardii  250 mg Oral BID   tamsulosin  0.4 mg Oral QPC supper   Continuous Infusions:   LOS: 0 days   Time spent: 37 mins  Jaquin Coy Laural Benes, MD How to contact the [facility] Attending or Consulting provider 7A - 7P or covering provider during after hours 7P -7A, for this patient?  Check the care team in [facility] and look for a) attending/consulting TRH provider listed and b) the [facility] team listed Log into [email or web address] and use [place]'s universal password to access. If you do not have the password, please contact the hospital operator. Locate the [facility] provider you are looking for under [facility] and page to a number that you can be directly reached. If you still have difficulty reaching the provider, please page the [facility] (Director on Call) for the Hospitalists listed on  [email or web address] for assistance.  12/06/2021, 12:32 PM

## 2021-12-06 NOTE — TOC Progression Note (Signed)
Transition of Care [facility]) - Progression Note    Patient Details  Name: Debbie [name] MRN: [id number] Date of Birth: 1941/03/20  Transition of Care [facility]) CM/SW Contact  Annice Needy, LCSW Phone Number: 12/06/2021, 2:34 PM  Clinical Narrative:    Patient is agreeable to OPPT. Chooses [facility] OPPT in [place]. Order placed.         Expected Discharge Plan and Services                                                 Social Determinants of Health (SDOH) Interventions    Readmission Risk Interventions    11/29/2021    3:17 PM  Readmission Risk Prevention Plan  Transportation Screening Complete  PCP or Specialist Appt within 5-7 Days Complete  Home Care Screening Complete  Medication Review (RN CM) Complete

## 2021-12-06 NOTE — Evaluation (Signed)
Occupational Therapy Evaluation Patient Details Name: Debbie [name] MRN: [id number] DOB: 1941/03/30 Today's Date: 12/06/2021   History of Present Illness Debbie [name] is a 81 year old female with multiple recent hospitalizations for coronary artery disease recently discharged on 12/03/2021 after being admitted for non-ST elevation MI that developed into an ST elevation without angina and taken for cardiac catheterization on 11/24/2021.  She has a history of ischemic cardiomyopathy with an EF of 25 to 30%.  During this recent hospitalization she had a drug-eluting stent placed.  She has an LV thrombus and she is fully anticoagulated with apixaban.     Patient reports that since being at home that she thought she was going to have 24/7 care takers to assist her but apparently has had very limited assistance.  She reports that she has become increasingly weak and reports she can barely stand and walk 2 steps without having to sit down.  She reports dyspnea and not able to catch breath.  She denies wheezing.  She denies cough and fever.  She presented to the emergency department due to dyspnea. (per MD)   Clinical Impression   Pt agreeable to OT evaluation. Pt demonstrating general B UE weakness but not limiting UE functional use. Pt demonstrates fair to good sitting balance with ability to sit upright in bed from supine without assist. Pt was initially unsteady in standing needing physical assist after loss of balance to R side. Pt sat in chair until no longer feeling dizzy. After this pt was able to ambulate in hall for ~50 feet or more with reports of feeling weak. Pt is able to complete seated ADL tasks without assist and toilet transfer with supervision to Min G assist.  No AD used throughout session but pt does have a RW and cane at home. Home health OT may be beneficial to increase pt strength and endurance as well as make and modifications to pt's routine or home set up to improve safety. Pt  is not recommended for further acute OT services and will be discharged to care of nursing staff for remaining length of stay.      Recommendations for follow up therapy are one component of a multi-disciplinary discharge planning process, led by the attending physician.  Recommendations may be updated based on patient status, additional functional criteria and insurance authorization.   Follow Up Recommendations  Home health OT    Assistance Recommended at Discharge Set up Supervision/Assistance  Patient can return home with the following A little help with walking and/or transfers;Help with stairs or ramp for entrance    Functional Status Assessment  Patient has had a recent decline in their functional status and demonstrates the ability to make significant improvements in function in a reasonable and predictable amount of time.  Equipment Recommendations  None recommended by OT    Recommendations for Other Services       Precautions / Restrictions Precautions Precautions: Fall Restrictions Weight Bearing Restrictions: No      Mobility Bed Mobility Overal bed mobility: Independent                  Transfers Overall transfer level: Needs assistance Equipment used: None Transfers: Sit to/from Stand, Bed to chair/wheelchair/BSC Sit to Stand: Min guard     Step pivot transfers: Min guard     General transfer comment: Pt noted to lose balance to R side during intial stand from EOB needing support from this therapist. Pt reported feeling dizzy and sat in chair.  After this pt ambulated better with Min G assist and not other instances of losing balance to the point of needing assist.      Balance Overall balance assessment: Needs assistance Sitting-balance support: No upper extremity supported, Feet supported Sitting balance-Leahy Scale: Good Sitting balance - Comments: seated EOB   Standing balance support: No upper extremity supported, During functional  activity Standing balance-Leahy Scale: Fair Standing balance comment: without RW; lost balance to R side once.                           ADL either performed or assessed with clinical judgement   ADL Overall ADL's : Needs assistance/impaired     Grooming: Supervision/safety;Min guard;Standing   Upper Body Bathing: Independent   Lower Body Bathing: Modified independent;Sitting/lateral leans   Upper Body Dressing : Independent;Sitting   Lower Body Dressing: Modified independent;Sitting/lateral leans Lower Body Dressing Details (indicate cue type and reason): Doffed and donned sock indepndently while seated. Toilet Transfer: Min guard;Ambulation;Supervision/safety Toilet Transfer Details (indicate cue type and reason): Chair to toilet without AD using Grab bar to stand from toilet. Toileting- Clothing Manipulation and Hygiene: Modified independent;Sitting/lateral lean       Functional mobility during ADLs: Min guard General ADL Comments: Pt unsteady at times in standing which improved after initial attempt of ambulation in room where pt reported feeling dizzy.     Vision Baseline Vision/History: 1 Wears glasses Ability to See in Adequate Light: 1 Impaired Patient Visual Report: No change from baseline (No acute changes.) Vision Assessment?: No apparent visual deficits                Pertinent Vitals/Pain Pain Assessment Pain Assessment: No/denies pain     Hand Dominance Right   Extremity/Trunk Assessment Upper Extremity Assessment Upper Extremity Assessment: Generalized weakness   Lower Extremity Assessment Lower Extremity Assessment: Defer to PT evaluation   Cervical / Trunk Assessment Cervical / Trunk Assessment: Normal   Communication Communication Communication: No difficulties   Cognition Arousal/Alertness: Awake/alert Behavior During Therapy: WFL for tasks assessed/performed, Anxious Overall Cognitive Status: Within Functional Limits for  tasks assessed                                                        Home Living Family/patient expects to be discharged to:: Private residence Living Arrangements: Alone Available Help at Discharge: Family;Friend(s);Available PRN/intermittently Type of Home: House Home Access: Stairs to enter [employer] of Steps: 2 Entrance Stairs-Rails: Right Home Layout: One level     Bathroom Shower/Tub: Chief Strategy Officer: Handicapped height     Home Equipment: Agricultural consultant (2 wheels);Cane - single point;Shower seat;Grab bars - toilet   Additional Comments: Pt reported no change in history since recent admission.      Prior Functioning/Environment Prior Level of Function : Independent/Modified Independent             Mobility Comments: Indepndent community ambulator with reports of using RW only when in her yard due to the uneaven surface. Pt reports gardening at baseline. ADLs Comments: Independent ADL and IADL         AM-PAC OT "6 Clicks" Daily Activity     Outcome Measure Help from another person eating meals?: None Help from another person taking care of personal  grooming?: A Little Help from another person toileting, which includes using toliet, bedpan, or urinal?: None Help from another person bathing (including washing, rinsing, drying)?: A Little Help from another person to put on and taking off regular upper body clothing?: None Help from another person to put on and taking off regular lower body clothing?: None 6 Click Score: 22   End of Session    Activity Tolerance: Patient tolerated treatment well Patient left: in chair;with call bell/phone within reach  OT Visit Diagnosis: Muscle weakness (generalized) (M62.81);Other abnormalities of gait and mobility (R26.89);Dizziness and giddiness (R42)                Time: [id number] OT Time Calculation (min): 22 min Charges:  OT General Charges $OT Visit: 1 Visit OT  Evaluation $OT Eval Low Complexity: 1 Low  Brinson Tozzi OT, MOT  Larey Seat 12/06/2021, 10:43 AM

## 2021-12-06 NOTE — Care Management Obs Status (Signed)
MEDICARE OBSERVATION STATUS NOTIFICATION   Patient Details  Name: Debbie [name] MRN: [id number] Date of Birth: [birth year]   Medicare Observation Status Notification Given:  Yes    Corey Harold 12/06/2021, 1:35 PM

## 2021-12-06 NOTE — Evaluation (Addendum)
Physical Therapy Evaluation Patient Details Name: Debbie [name] MRN: [id number] DOB: [birth year] Today's Date: 12/06/2021  History of Present Illness  Debbie [name] is a 81 year old female with multiple recent hospitalizations for coronary artery disease recently discharged on 12/03/2021 after being admitted for non-ST elevation MI that developed into an ST elevation without angina and taken for cardiac catheterization on 11/24/2021.  She has a history of ischemic cardiomyopathy with an EF of 25 to 30%.  During this recent hospitalization she had a drug-eluting stent placed.  She has an LV thrombus and she is fully anticoagulated with apixaban.     Patient reports that since being at home that she thought she was going to have 24/7 care takers to assist her but apparently has had very limited assistance.  She reports that she has become increasingly weak and reports she can barely stand and walk 2 steps without having to sit down.  She reports dyspnea and not able to catch breath.  She denies wheezing.  She denies cough and fever.  She presented to the emergency department due to dyspnea. (per MD).   Clinical Impression  Patient supine in bed upon therapist arrival and agreeable to PT evaluation today. Patient sitting in chair assisted by OT earlier this morning. Patient somewhat unsteady upon initial standing reaching for objects momentarily to steady herself. Patient able to ambulate 250 feet without an assistive device using a somewhat slow cadence using short equal length steps. Patient did demonstrate 2 instances of minor loss of balance and ability to independently right herself with stepping strategy. Patient on room air throughout and somewhat limited by fatigue. Patient would benefit from ambulation with nursing while in this venue of care. Patient evaluated by Physical Therapy with no further acute PT needs identified. All education has been completed and the patient has no further  questions. See below for any follow-up Physical Therapy or equipment needs. PT is signing off. Thank you for this referral.      Recommendations for follow up therapy are one component of a multi-disciplinary discharge planning process, led by the attending physician.  Recommendations may be updated based on patient status, additional functional criteria and insurance authorization.  Follow Up Recommendations Outpatient PT      Assistance Recommended at Discharge PRN  Patient can return home with the following  Assistance with cooking/housework;Assist for transportation;Help with stairs or ramp for entrance    Equipment Recommendations None recommended by PT  Recommendations for Other Services       Functional Status Assessment Patient has had a recent decline in their functional status and demonstrates the ability to make significant improvements in function in a reasonable and predictable amount of time.     Precautions / Restrictions Precautions Precautions: Fall Restrictions Weight Bearing Restrictions: No      Mobility  Bed Mobility     General bed mobility comments: patient sitting in chair at beginning and end of session    Transfers Overall transfer level: Needs assistance Equipment used: None Transfers: Sit to/from Stand, Bed to chair/wheelchair/BSC Sit to Stand: Min guard, Supervision Stand pivot transfers: Supervision, Min guard Step pivot transfers: Min guard, Supervision   Anterior-Posterior transfers: Modified independent (Device/Increase time)   General transfer comment: somewhat unsteady upon standing reaching for objects momentarily to steady oneself    Ambulation/Gait Ambulation/Gait assistance: Min guard Gait Distance (Feet): 250 Feet Assistive device: None Gait Pattern/deviations: Step-through pattern, Decreased step length - right, Decreased step length - left, Decreased stride length, Narrow  base of support Gait velocity: decreased      General Gait Details: somewhat slow cadence with short equal length steps; patient did demonstrate 2 instances of minor loss of balance and ability to independently right herself with stepping strategy; on room air; limited by fatigue  Stairs      Wheelchair Mobility    Modified Rankin (Stroke Patients Only)       Balance Overall balance assessment: Needs assistance Sitting-balance support: No upper extremity supported, Feet supported Sitting balance-Leahy Scale: Good Sitting balance - Comments: seated in chair   Standing balance support: No upper extremity supported, During functional activity Standing balance-Leahy Scale: Fair Standing balance comment: without assistive device         Pertinent Vitals/Pain Pain Assessment Pain Assessment: No/denies pain Pain Intervention(s): Monitored during session    Home Living Family/patient expects to be discharged to:: Private residence Living Arrangements: Alone Available Help at Discharge: Family;Friend(s);Available PRN/intermittently Type of Home: House Home Access: Stairs to enter Entrance Stairs-Rails: Right Entrance Stairs-Number of Steps: 2   Home Layout: One level Home Equipment: Conservation officer, nature (2 wheels);Cane - single point;Shower seat;Grab bars - toilet;Rollator (4 wheels) Additional Comments: Pt reported no change in history since recent admission.    Prior Function Prior Level of Function : Independent/Modified Independent;Driving   Mobility Comments: Indepndent community ambulator with reports of using RW only when in her yard due to the uneven surface. Pt reports gardening at baseline. ADLs Comments: Independent ADL and IADL     Hand Dominance   Dominant Hand: Right    Extremity/Trunk Assessment   Upper Extremity Assessment Upper Extremity Assessment: Defer to OT evaluation    Lower Extremity Assessment Lower Extremity Assessment: Generalized weakness    Cervical / Trunk Assessment Cervical /  Trunk Assessment: Normal  Communication   Communication: No difficulties  Cognition Arousal/Alertness: Awake/alert Behavior During Therapy: WFL for tasks assessed/performed, Anxious Overall Cognitive Status: Within Functional Limits for tasks assessed        General Comments      Exercises     Assessment/Plan    PT Assessment All further PT needs can be met in the next venue of care  PT Problem List Decreased strength;Decreased mobility;Decreased activity tolerance;Decreased balance       PT Treatment Interventions      PT Goals (Current goals can be found in the Care Plan section)       Frequency          AM-PAC PT "6 Clicks" Mobility  Outcome Measure Help needed turning from your back to your side while in a flat bed without using bedrails?: None Help needed moving from lying on your back to sitting on the side of a flat bed without using bedrails?: None Help needed moving to and from a bed to a chair (including a wheelchair)?: A Little Help needed standing up from a chair using your arms (e.g., wheelchair or bedside chair)?: A Little Help needed to walk in hospital room?: A Little Help needed climbing 3-5 steps with a railing? : A Little 6 Click Score: 20    End of Session   Activity Tolerance: Patient tolerated treatment well;Patient limited by fatigue Patient left: in chair;with call bell/phone within reach Nurse Communication: Mobility status PT Visit Diagnosis: Unsteadiness on feet (R26.81);Other abnormalities of gait and mobility (R26.89);Muscle weakness (generalized) (M62.81)    Time: 9179-1505 PT Time Calculation (min) (ACUTE ONLY): 23 min   Charges:   PT Evaluation $PT Eval Low Complexity: 1 Low PT  Treatments $Therapeutic Activity: 8-22 mins        Floria Raveling. Hartnett-Rands, MS, PT Per [place] [phone]  Pamala Hurry  Hartnett-Rands 12/06/2021, 12:52 PM

## 2021-12-06 NOTE — Assessment & Plan Note (Addendum)
-   pt completing a 10 day course of oral cephalexin as recommended by urologist that saw her during last hospitalization.  She has outpatient follow up with urologist

## 2021-12-06 NOTE — Consult Note (Addendum)
Cardiology Consultation:   Patient ID: Debbie [name] MRN: [id number]; DOB: 17-Oct-1940  Admit date: 12/05/2021 Date of Consult: 12/06/2021  PCP:  Caryl Bis, MD   [facility] Providers Cardiologist:  Rozann Lesches, MD        Patient Profile:   Debbie [name] is a 81 y.o. female with a hx of CAD (s/p CABG in 2006), HTN, HLD and COPD who is being seen 12/06/2021 for the evaluation of CHF at the request of Dr. Wynetta Emery.  History of Present Illness:   [name] recently experienced a prolonged hospitalization at [facility] from 7/6 - 7/17/202 for an NSTEMI.  Initial cardiac catheterization showed severe native vessel CAD with patent LIMA to the LAD and occlusion of her saphenous vein grafts. She did undergo successful PCI of the native left main and native LCx with DES placement.  On 11/24/2021, EKG showed significant ST elevation and she went back for repeat catheterization and was found to have widely patent stent into her left main and LCx which was previously placed but did have high-grade proximal SVG to D1 stenosis likely representing the "culprit lesion" and a DES was placed.  Echocardiogram during admission did show a reduced EF of 25 to 30% with LV thrombus and she was started on Eliquis along with being on ASA and Brilinta.  GDMT was limited secondary to marginal BP and AKI.  She did develop hematuria and ASA was stopped but hematuria persisted and she was ultimately transitioned from Brilinta to Plavix and continued on Eliquis 2.5 mg twice daily.  Also had acute urinary retention during admission and was discharged with a Foley catheter.  She called the office on 12/05/2021 reporting worsening dyspnea and dizziness and was acutely short of breath while talking on phone, therefore she was advised to go to the ED for further evaluation. She reports feeling very weak since returning home. Was having dyspnea and leg weakness with walking less than 10 feet. No specific  orthopnea or PND but had noticed lower extremity edema. Reports her chest would still hurt at times but pain was not as severe as it was during her admission. Initial labs showed WBC 8.9, Hgb 8.7 (improving from 7.9 on most recent check), platelets 544, Na+ 140, K+ 3.2 and creatinine 0.82. Mg 1.9.  D-dimer elevated to 1.91.  TSH elevated at 8.232.  BNP elevated to 579.  Initial and repeat troponin at 173 and 165 (previously peaked at greater than 24,000 on 11/24/2021).  CXR showed left greater than right basilar airspace opacities presumed secondary to atelectasis but PNA cannot be excluded and a small left pleural effusion.  EKG shows normal sinus rhythm, heart rate 88 with LVH and anterior infarct pattern with diffuse ST abnormalities which have improved when compared to prior imaging.  She was admitted for further management of generalized weakness which was felt to be due to deconditioning with PT evaluation recommended.  She received IV Lasix 40 mg while in the ED with a recorded net output of -1.4 L thus far.  Weight down 3 pounds from 127 lbs to 124 lbs.  Repeat BNP today is still elevated at 415 but has improved and creatinine remains stable at 0.76.  Past Medical History:  Diagnosis Date   Allergic rhinitis    Anxiety    Arthritis    Asthma    Bladder spasms    Carotid artery disease (HCC)    Collagen vascular disease (HCC)    COPD (chronic obstructive pulmonary disease) (  [place])    Cystocele    Essential hypertension    GERD (gastroesophageal reflux disease)    Hyperlipidemia    Hypothyroidism    Ischemic cardiomyopathy 12/02/2021   Echo 11/27/2021:     Multiple vessel coronary artery disease => CABG (LIMA-dLAD, SVG-Diag, Seq SVG-OM-OM-PDA)    CABG 2006 [facility] //   dobutamine nuclear 2007, no scar or ischemia, EF 65%  //   Lexiscan Myoview February, 2011, breast attenuation, EF 65%, no ischemia. => NSTEMI 11/23/2021   NSTEMI (non-ST elevated myocardial infarction) ([place]) 11/22/2021    Osteoporosis    Renal insufficiency    Vaginal vault prolapse     Past Surgical History:  Procedure Laterality Date   ANTERIOR AND POSTERIOR REPAIR N/A 04/06/2013   Procedure: CYSTOCELE REPAIR ;  Surgeon: Reece Packer, MD;  Location: [facility];  Service: Urology;  Laterality: N/A;   CATARACTS     REMOVED   COLONOSCOPY     CORNEA LACERATION REPAIR     CORONARY ARTERY BYPASS GRAFT     X 5 VESSELS   CORONARY STENT INTERVENTION N/A 11/23/2021   Procedure: CORONARY STENT INTERVENTION;  Surgeon: Sherren Mocha, MD;  Location: [place] [facility];  Service: Cardiovascular;  Laterality: N/A;   CORONARY/GRAFT ACUTE MI REVASCULARIZATION N/A 11/24/2021   Procedure: Coronary/Graft Acute MI Revascularization;  Surgeon: Lorretta Harp, MD;  Location: [place] [facility];  Service: Cardiovascular;  Laterality: N/A;   CYSTOSCOPY N/A 04/06/2013   Procedure: CYSTOSCOPY;  Surgeon: Reece Packer, MD;  Location: [facility];  Service: Urology;  Laterality: N/A;   HERNIA REPAIR     LEFT HEART CATH AND CORONARY ANGIOGRAPHY N/A 11/24/2021   Procedure: LEFT HEART CATH AND CORONARY ANGIOGRAPHY;  Surgeon: Lorretta Harp, MD;  Location: [place] [facility];  Service: Cardiovascular;  Laterality: N/A;   LEFT HEART CATH AND CORS/GRAFTS ANGIOGRAPHY N/A 11/23/2021   Procedure: LEFT HEART CATH AND CORS/GRAFTS ANGIOGRAPHY;  Surgeon: Sherren Mocha, MD;  Location: [place] [facility];  Service: Cardiovascular;  Laterality: N/A;   SEPTOPLASTY N/A 04/28/2015   Procedure: SEPTOPLASTY;  Surgeon: Ruby Cola, MD;  Location: [place];  Service: ENT;  Laterality: N/A;   SINUS ENDO W/FUSION Bilateral 04/28/2015   Procedure: ENDOSCOPIC SINUS SURGERY WITH NAVIGATION;  Surgeon: Ruby Cola, MD;  Location: [place];  Service: ENT;  Laterality: Bilateral;   TUBAL LIGATION       Home Medications:  Prior to Admission medications   Medication Sig Start Date End Date Taking? Authorizing Provider  apixaban (ELIQUIS) 2.5 MG TABS tablet  Take 1 tablet (2.5 mg total) by mouth 2 (two) times daily. 11/28/21  Yes Duke, Tami Lin, PA  atorvastatin (LIPITOR) 80 MG tablet Take 1 tablet (80 mg total) by mouth at bedtime. 01/03/19  Yes Emokpae, Courage, MD  clopidogrel (PLAVIX) 75 MG tablet Take 1 tablet (75 mg total) by mouth daily. 12/01/21  Yes Duke, Tami Lin, PA  ezetimibe (ZETIA) 10 MG tablet Take 1 tablet (10 mg total) by mouth daily. 11/29/21  Yes Duke, Tami Lin, PA  famotidine (PEPCID) 20 MG tablet Take 40 mg by mouth at bedtime. 08/18/18  Yes [provider]  furosemide (LASIX) 40 MG tablet Take 1 tablet (40 mg total) by mouth daily as needed (for weight gain of 3 lbs overnight or 5 lbs in 1 week.). 11/28/21  Yes Duke, Tami Lin, PA  isosorbide mononitrate (IMDUR) 30 MG 24 hr tablet Take 1 tablet (30 mg total) by mouth daily.  11/16/19  Yes Jonelle Sidle, MD  levocetirizine (XYZAL) 5 MG tablet Take 5 mg by mouth every evening. Equate levocetirizine.   Yes [provider]  levothyroxine (SYNTHROID) 88 MCG tablet Take 88 mcg by mouth daily. 09/04/18  Yes [provider]  metoprolol succinate (TOPROL-XL) 25 MG 24 hr tablet Take 0.5 tablets (12.5 mg total) by mouth daily. 11/30/21  Yes Duke, Roe Rutherford, PA  nitroGLYCERIN (NITROSTAT) 0.4 MG SL tablet Place 1 tablet (0.4 mg total) under the tongue every 5 (five) minutes x 3 doses as needed for chest pain (if no relief after 3rd dose, call 911 or proceed to the ED for an evaluation). 08/06/19  Yes Netta Neat., NP  ondansetron (ZOFRAN) 4 MG tablet Take 1 tablet (4 mg total) by mouth every 6 (six) hours as needed for nausea. 10/20/21  Yes Shah, Pratik D, DO  pantoprazole (PROTONIX) 40 MG tablet Take 40 mg by mouth daily. 10/21/21  Yes [provider]  potassium chloride (KLOR-CON M) 10 MEQ tablet Take 2 tablets (20 mEq total) by mouth daily as needed (only on days she takes lasix.). 11/28/21  Yes Duke, Roe Rutherford, PA  tamsulosin (FLOMAX)  0.4 MG CAPS capsule Take 0.4 mg by mouth.   Yes [provider]  acetaminophen (TYLENOL) 650 MG CR tablet Take 650 mg by mouth in the morning.    [provider]  umeclidinium-vilanterol (ANORO ELLIPTA) 62.5-25 MCG/INH AEPB Inhale 1 puff into the lungs daily. Patient not taking: Reported on 10/18/2021 01/04/19   Shon Hale, MD  potassium chloride (MICRO-K) 10 MEQ CR capsule Take 20 mEq by mouth 2 (two) times daily.   11/28/21  [provider]    Inpatient Medications: Scheduled Meds:  apixaban  2.5 mg Oral BID   atorvastatin  80 mg Oral QHS   cephALEXin  500 mg Oral Q12H   clopidogrel  75 mg Oral Daily   ezetimibe  10 mg Oral Daily   famotidine  40 mg Oral QHS   isosorbide mononitrate  30 mg Oral Daily   levothyroxine  88 mcg Oral Q0600   metoprolol succinate  12.5 mg Oral Daily   pantoprazole  40 mg Oral Daily   saccharomyces boulardii  250 mg Oral BID   tamsulosin  0.4 mg Oral QPC supper   Continuous Infusions:  PRN Meds: acetaminophen **OR** acetaminophen, bisacodyl, fentaNYL (SUBLIMAZE) injection, HYDROcodone-acetaminophen, ipratropium-albuterol, loratadine, nitroGLYCERIN, ondansetron **OR** ondansetron (ZOFRAN) IV  Allergies:    Allergies  Allergen Reactions   Ivp Dye [Iodinated Contrast Media] Swelling    Kidney Dye   Betadine [Povidone Iodine] Rash   Codeine Nausea And Vomiting   Other Nausea And Vomiting    Narcotics - nausea,vomiting   Penicillins Rash    Did it involve swelling of the face/tongue/throat, SOB, or low BP? No Did it involve sudden or severe rash/hives, skin peeling, or any reaction on the inside of your mouth or nose? No Did you need to seek medical attention at a hospital or doctor's office? No When did it last happen?       If all above answers are "NO", may proceed with cephalosporin use.    Sulfa Antibiotics Rash    Social History:   Social History   Socioeconomic History   Marital status: Legally Separated     Spouse name: Not on file   Number of children: 5   Years of education: Not on file   Highest education level: Not on file  Occupational History   Occupation: Retired  Tobacco Use   Smoking status: Never   Smokeless tobacco: Never  Vaping Use   Vaping Use: Never used  Substance and Sexual Activity   Alcohol use: Yes    Alcohol/week: 0.0 standard drinks of alcohol    Comment: 1 drink 1 time a year   Drug use: No   Sexual activity: Not on file  Other Topics Concern   Not on file  Social History Narrative   Not on file   Social Determinants of Health   Financial Resource Strain: Unknown (12/31/2018)   Overall Financial Resource Strain (CARDIA)    Difficulty of Paying Living Expenses: Patient refused  Food Insecurity: Unknown (12/31/2018)   Hunger Vital Sign    Worried About Running Out of Food in the Last Year: Patient refused    [place] in the Last Year: Patient refused  Transportation Needs: Unknown (12/31/2018)   PRAPARE - Transportation    Lack of Transportation (Medical): Patient refused    Lack of Transportation (Non-Medical): Patient refused  Physical Activity: Unknown (12/31/2018)   Exercise Vital Sign    Days of Exercise per Week: Patient refused    Minutes of Exercise per Session: Patient refused  Stress: Unknown (12/31/2018)   [place]    Feeling of Stress : Patient refused  Social Connections: Unknown (12/31/2018)   Social Connection and Isolation Panel [NHANES]    Frequency of Communication with Friends and Family: Patient refused    Frequency of Social Gatherings with Friends and Family: Patient refused    Attends Religious Services: Patient refused    Active Member of Clubs or Organizations: Patient refused    Attends Archivist Meetings: Patient refused    Marital Status: Patient refused  Intimate Partner Violence: Unknown (12/31/2018)   Humiliation, Afraid, Rape, and Kick  questionnaire    Fear of Current or Ex-Partner: Patient refused    Emotionally Abused: Patient refused    Physically Abused: Patient refused    Sexually Abused: Patient refused    Family History:    Family History  Problem Relation Age of Onset   Cirrhosis Father    Lung disease Father    Diabetes Mellitus II Mother    Heart Problems Mother      ROS:  Please see the history of present illness.   All other ROS reviewed and negative.     Physical Exam/Data:   Vitals:   12/05/21 2352 12/06/21 0342 12/06/21 0608 12/06/21 0801  BP: (!) 91/51 103/65  112/61  Pulse: 84 91  81  Resp: 20 16  16   Temp: (!) 97.4 F (36.3 C) 98.4 F (36.9 C)  98.4 F (36.9 C)  TempSrc: Oral   Oral  SpO2: 96% 96%  97%  Weight:   56.6 kg   Height:        Intake/Output Summary (Last 24 hours) at 12/06/2021 0924 Last data filed at 12/06/2021 [id number] Gross per 24 hour  Intake 480 ml  Output 1925 ml  Net -1445 ml      12/06/2021    6:08 AM 12/05/2021    7:29 PM 12/05/2021   11:11 AM  Last 3 Weights  Weight (lbs) 124 lb 12.5 oz 127 lb 3.3 oz 122 lb  Weight (kg) 56.6 kg 57.7 kg 55.339 kg     Body mass index is 22.82 kg/m.  General: Pleasant elderly female appearing in no acute distress HEENT: normal  Neck: no JVD Vascular: No carotid bruits; Distal pulses 2+ bilaterally Cardiac:  normal S1, S2; RRR; no murmur  Lungs: rales along right base. No wheezing.  Abd: soft, nontender, no hepatomegaly  Ext: no pitting edema Musculoskeletal:  No deformities, BUE and BLE strength normal and equal Skin: warm and dry  Neuro:  CNs 2-12 intact, no focal abnormalities noted Psych:  Normal affect   EKG:  The EKG was personally reviewed and demonstrates: normal sinus rhythm, heart rate 88 with LVH and anterior infarct pattern with diffuse ST abnormalities which have improved when compared to prior imaging.  Telemetry:  Telemetry was personally reviewed and demonstrates: NSR, HR in 70's to 80's. Brief episodes  of narrow-complex tachycardia this AM lasting for 6 seconds and repeat for 7 seconds.   Relevant CV Studies:  Cardiac Catheterization: 11/23/2021 1.  Severe native vessel coronary artery disease with severe ostial left main stenosis, total occlusion of the LAD, severe stenosis in the distal left main/circumflex junction, severe diffuse stenosis of the distal AV circumflex, and a diffusely diseased nondominant RCA 2.  Status post aortocoronary bypass surgery with continued patency of the LIMA to LAD and occlusion of the saphenous vein grafts 3.  Successful PCI of the native left main and native circumflex using a 4.0 x 24 mm Synergy DES  Recommendation: Dual antiplatelet therapy with aspirin and ticagrelor at least 12 months, aggressive risk reduction measures, suspect the patient occluded a vein graft causing her acute coronary syndrome.  There were no vein graft targets for PCI.  Her native left main into the circumflex is treated as this large territory is now potentially ischemic after occlusion of the sequential saphenous vein graft to OM and PLA.  Repeat Cath: 11/24/2021   Dist LM to Prox LAD lesion is 100% stenosed.   Ost LM to Dist LM lesion is 25% stenosed.   Dist Cx lesion is 50% stenosed.   Origin to Prox Graft lesion is 99% stenosed.   Previously placed Ost Cx to Prox Cx stent of unknown type is  widely patent.   A drug-eluting stent was successfully placed using a STENT ONYX FRONTIER 3.0X18.   Post intervention, there is a 0% residual stenosis.  IMPRESSION: Widely patent left main into the circumflex stent performed yesterday by Dr. Burt Knack.  Atretic appearing LIMA to a small distal LAD.  Patent sequential vein to an OM and PDA with high-grade proximal SVG to diagonal vein graft stenosis probably representing the "culprit lesion with an excellent angiographic result after PCI drug-eluting stenting.  Hopefully her anterior wall stunned and will come back.  The sheath will be removed  once ACT falls below 170 pressure held.  I am going to restart heparin 4 hours after sheath removal for 24 hours.  Given her elevated LVEDP, she probably should be quire some diuresis as well.  She left the lab in stable condition.   Limited Echo: 11/2021 IMPRESSIONS     1. Follow up limited study with [facility] shows persistent apical left  ventricular thrombus. The clot now appears fixed, although with a  relatively narrow attachment. Width 7 mm, length 13 mm.. Left ventricular  ejection fraction, by estimation, is 25 to  30%. The left ventricle has severely decreased function.   Laboratory Data:  High Sensitivity Troponin:   Recent Labs  Lab 11/22/21 1552 11/22/21 1752 11/24/21 0830 12/05/21 1154 12/05/21 1346  TROPONINIHS 208* 420* >24,000* 173* 165*     Chemistry Recent Labs  Lab 12/03/21 0253 12/05/21  1154 12/06/21 0527  NA 137 140 139  K 4.1 3.2* 4.1  CL 110 109 107  CO2 24 25 26   GLUCOSE 97 189* 112*  BUN 13 9 8   CREATININE 0.88 0.82 0.76  CALCIUM 8.3* 8.8* 8.5*  MG 2.2 1.9 1.9  GFRNONAA >60 >60 >60  ANIONGAP 3* 6 6    No results for input(s): "PROT", "ALBUMIN", "AST", "ALT", "ALKPHOS", "BILITOT" in the last 168 hours. Lipids  Recent Labs  Lab 12/06/21 0527  CHOL 123  TRIG 125  HDL 31*  LDLCALC 67  CHOLHDL 4.0    Hematology Recent Labs  Lab 12/03/21 0253 12/05/21 1154 12/06/21 0527  WBC 7.5 8.9 7.8  RBC 2.81* 3.12* 2.89*  HGB 7.9* 8.7* 8.2*  HCT 24.8* 28.2* 26.1*  MCV 88.3 90.4 90.3  MCH 28.1 27.9 28.4  MCHC 31.9 30.9 31.4  RDW 15.5 16.0* 16.2*  PLT 496* 544* 520*   Thyroid  Recent Labs  Lab 12/05/21 1154  TSH 8.232*    BNP Recent Labs  Lab 12/05/21 1157 12/06/21 0527  BNP 579.0* 415.0*    DDimer  Recent Labs  Lab 12/05/21 1154  DDIMER 1.91*     Radiology/Studies:  DG Chest Portable 1 View  Result Date: 12/05/2021 CLINICAL DATA:  81 year old female with dyspnea EXAM: PORTABLE CHEST 1 VIEW COMPARISON:  Radiographs  11/22/2021 FINDINGS: Stable heart size and mediastinal contours status post median sternotomy. Aortic atherosclerosis. Patchy opacities in the left-greater-than-right lung bases. Small left pleural effusion. No pneumothorax. IMPRESSION: 1. Left-greater-than-right basilar airspace opacities are presumed atelectasis though pneumonia is difficult to exclude. Small left pleural effusion. 2. Aortic atherosclerosis post median sternotomy. Electronically Signed   By: 96 M.D.   On: 12/05/2021 12:23     Assessment and Plan:   1. HFrEF - Recent echo showed her EF was reduced at 25-30% and medical therapy was limited in the setting of her hypotension and AKI. BNP elevated to 579 this admission and she received IV Lasix 40mg  x1 yesterday with a net output of -1.4 L and weight down 3 lbs. Will need to establish a new dry weight as suspect she lost fat and muscle mass during her recent admission. Will dose IV Lasix 40mg  x1 again today and assess response. Prior to her admission earlier this month, she was taking Lasix 60mg  daily at home. Anticipate she will need a standing dose of PO Lasix at the time of discharge but can perhaps try 20mg  daily initially and adjust as an outpatient if needed. Continue Toprol-XL and Imdur. BP has not allowed for ACE-I/ARB/ARNI/Spiro thus far and she has not been on an SLGT2 inhibitor given recent cystitis.   2. CAD - She is s/p CABG in 2006 and recently underwent PCI/DES of the native left main and native LCx with subsequent DES placement to the SVG-D1 later in admission. - Hs Troponin values this admission has been at 173 and 165 which is down-trending from previously peaked values of greater than 24,000 on 11/24/2021. EKG shows diffuse ST abnormalities but improved from prior tracings.  - Continue Plavix 75mg  daily, Imdur 30mg  daily, Atorvastatin 80mg  daily and Zetia 10mg  daily. Will restart Toprol-XL 12.5mg  daily. Not on ASA given the need for Eliquis and recent hematuria.    3. LV Thrombus - Noted on most recent echocardiogram. She remains on Eliquis 2.5mg  BID. Anticipate repeat imaging in 3 months for reassessment.   4. HTN - BP has been variable from 91/51 - 141/74 since admission, at 112/61  on most recent check. Continue Imdur 30mg  daily and will restart Toprol-XL 12.5mg  daily given her intermittent tachycardia on telemetry.   5. HLD - LDL was at 138 earlier this month, improved to 67 on repeat check. Continue Atorvastatin 80mg  daily and Zetia 10mg  daily.   6. Generalized Weakness - She was living by herself prior to admission and reports dyspnea and leg weakness with minimal activity. PT Evaluation pending.   7. Anemia - Hgb at 8.2 this AM. No reports of active bleeding. Continue to follow.   8. Urinary Retention/Cystitis - Foley catheter remains in place. No recurrent hematuria. She has outpatient follow-up scheduled with Urology next week. She was discharged on Keflex 500mg  TID for 10 days and will make the admitting team aware to see if they want to reorder it for this admission so she can complete her antibiotic course.    For questions or updates, please contact [place] Please consult [email or web address] for contact info under    Signed, Erma Heritage, PA-C  12/06/2021 9:24 AM   Attending note Patient seen and discussed PA Strader, I agree with her documentation. 81 yo female history of CAD with recent admission with ACS receiving PCI/DES of the native left main and native LCx with subsequent DES placement to the SVG-D1. That admission complicated by LV thrombus, hematuria with anemia and cysitis. Presents with generalized fatigue and dyspnea   Generalized fatigue -unclear etiology, defer to primary team - perhaps some component of deconditioning given recent prolonged admission  2. Dyspnea/Acute on chronic systolic HF - echo LVEF [id number] - some evidence of fluid overload on presentation, BNP 579 - - received IV lasix 40mg  x 1   yesterday, I/Os incomplete. Weight down 3 lbs, renal function is stable. REdose IV lasix today - will need higher lasix dosing at discharge.  - change toprol back to her prior bisoprolol, unclear if change could be related to her fatigue.  - BP has not allowed for ACE-I/ARB/ARNI/Spiro thus far  - her SGLT2i was stopped last admission, urology felt her emphasymatous cytisis may have been related and recs to d/c  3. LV thrombus - conitnue eliquis   4. CAD - ABG in 2006 and recently underwent PCI/DES of the native left main and native LCx with subsequent DES placement to the SVG-D1 - with LV thrombus and recent ACS she is on plavix and eliquis. Hematuria during recent admission when on triple therapy  Carlyle Dolly MD

## 2021-12-07 DIAGNOSIS — J9811 Atelectasis: Secondary | ICD-10-CM | POA: Diagnosis not present

## 2021-12-07 DIAGNOSIS — J438 Other emphysema: Secondary | ICD-10-CM | POA: Diagnosis not present

## 2021-12-07 DIAGNOSIS — I10 Essential (primary) hypertension: Secondary | ICD-10-CM | POA: Diagnosis not present

## 2021-12-07 DIAGNOSIS — Z951 Presence of aortocoronary bypass graft: Secondary | ICD-10-CM | POA: Diagnosis not present

## 2021-12-07 DIAGNOSIS — R531 Weakness: Secondary | ICD-10-CM | POA: Diagnosis not present

## 2021-12-07 DIAGNOSIS — I5021 Acute systolic (congestive) heart failure: Secondary | ICD-10-CM | POA: Diagnosis not present

## 2021-12-07 DIAGNOSIS — I255 Ischemic cardiomyopathy: Secondary | ICD-10-CM | POA: Diagnosis not present

## 2021-12-07 DIAGNOSIS — N3080 Other cystitis without hematuria: Secondary | ICD-10-CM | POA: Diagnosis not present

## 2021-12-07 DIAGNOSIS — I5023 Acute on chronic systolic (congestive) heart failure: Secondary | ICD-10-CM | POA: Diagnosis not present

## 2021-12-07 DIAGNOSIS — E785 Hyperlipidemia, unspecified: Secondary | ICD-10-CM | POA: Diagnosis not present

## 2021-12-07 DIAGNOSIS — I6523 Occlusion and stenosis of bilateral carotid arteries: Secondary | ICD-10-CM | POA: Diagnosis not present

## 2021-12-07 LAB — BASIC METABOLIC PANEL
Anion gap: 7 (ref 5–15)
BUN: 9 mg/dL (ref 8–23)
CO2: 27 mmol/L (ref 22–32)
Calcium: 8.8 mg/dL — ABNORMAL LOW (ref 8.9–10.3)
Chloride: 105 mmol/L (ref 98–111)
Creatinine, Ser: 0.85 mg/dL (ref 0.44–1.00)
GFR, Estimated: >60 mL/min (ref >60)
Glucose, Bld: 121 mg/dL — ABNORMAL HIGH (ref 70–99)
Potassium: 3.5 mmol/L (ref 3.5–5.1)
Sodium: 139 mmol/L (ref 135–145)

## 2021-12-07 LAB — MAGNESIUM: Magnesium: 2 mg/dL (ref 1.7–2.4)

## 2021-12-07 LAB — BRAIN NATRIURETIC PEPTIDE: B Natriuretic Peptide: 212 pg/mL — ABNORMAL HIGH (ref 0.0–100.0)

## 2021-12-07 MED ORDER — POTASSIUM CHLORIDE CRYS ER 10 MEQ PO TBCR: 10.0000 meq | EXTENDED_RELEASE_TABLET | Freq: Every day | ORAL | 3 refills | Status: AC

## 2021-12-07 MED ORDER — ISOSORBIDE MONONITRATE ER 30 MG PO TB24: 30.0000 mg | ORAL_TABLET | Freq: Every day | ORAL | 2 refills | Status: DC

## 2021-12-07 MED ORDER — CEPHALEXIN 500 MG PO CAPS
500.0000 mg | ORAL_CAPSULE | Freq: Two times a day (BID) | ORAL | 0 refills | Status: AC
Start: 2021-12-07 — End: 2021-12-13

## 2021-12-07 MED ORDER — FUROSEMIDE 40 MG PO TABS: 40.0000 mg | ORAL_TABLET | Freq: Every day | ORAL | 1 refills | Status: DC

## 2021-12-07 MED ORDER — FUROSEMIDE 40 MG PO TABS: 40.0000 mg | ORAL_TABLET | Freq: Every day | ORAL | 3 refills | Status: DC

## 2021-12-07 MED ORDER — CEPHALEXIN 500 MG PO CAPS: 500.0000 mg | ORAL_CAPSULE | Freq: Two times a day (BID) | ORAL | 0 refills | Status: DC

## 2021-12-07 MED ORDER — SACCHAROMYCES BOULARDII 250 MG PO CAPS: 250.0000 mg | ORAL_CAPSULE | Freq: Two times a day (BID) | ORAL | 0 refills | Status: AC

## 2021-12-07 MED ORDER — BISOPROLOL FUMARATE 5 MG PO TABS: 2.5000 mg | ORAL_TABLET | Freq: Every day | ORAL | 1 refills | Status: DC

## 2021-12-07 MED ORDER — SACCHAROMYCES BOULARDII 250 MG PO CAPS
250.0000 mg | ORAL_CAPSULE | Freq: Two times a day (BID) | ORAL | 0 refills | Status: DC
Start: 2021-12-07 — End: 2021-12-07

## 2021-12-07 MED ORDER — FUROSEMIDE 40 MG PO TABS
40.0000 mg | ORAL_TABLET | Freq: Every day | ORAL | Status: DC
  Administered 2021-12-07: 40 mg via ORAL
  Filled 2021-12-07: qty 1

## 2021-12-07 NOTE — Progress Notes (Signed)
DAILY PROGRESS NOTE   Patient Name: Debbie [name] Date of Encounter: 12/07/2021 Cardiologist: [name], MD  Chief Complaint   Fatigue  Patient Profile   Debbie [name] is a 81 y.o. female with a hx of CAD (s/p CABG in 2006), HTN, HLD and COPD who is being seen 12/06/2021 for the evaluation of CHF at the request of Dr. Laural Benes.  Subjective   Diuresed 890 ml negative overnight- now 2.3L negative. BNP trending down (however, not recommended to follow serial BNP's per ACC guidelines).  Objective   Vitals:   12/06/21 1256 12/06/21 2005 12/07/21 0347 12/07/21 0524  BP: 100/64 (!) 95/52 (!) 95/54   Pulse: 92 80 82   Resp: 18 20 20    Temp: 98.4 F (36.9 C) 98.5 F (36.9 C) 98.1 F (36.7 C)   TempSrc: Oral Oral Oral   SpO2: 96% 97% 96%   Weight:    59.7 kg  Height:        Intake/Output Summary (Last 24 hours) at 12/07/2021 0912 Last data filed at 12/07/2021 0524 Gross per 24 hour  Intake 840 ml  Output 950 ml  Net -110 ml   Filed Weights   12/05/21 1929 12/06/21 0608 12/07/21 0524  Weight: 57.7 kg 56.6 kg 59.7 kg    Physical Exam   General appearance: alert and no distress Lungs: clear to auscultation bilaterally Heart: regular rate and rhythm Extremities: extremities normal, atraumatic, no cyanosis or edema Neurologic: Grossly normal  Inpatient Medications    Scheduled Meds:  apixaban  2.5 mg Oral BID   atorvastatin  80 mg Oral QHS   bisoprolol  2.5 mg Oral Daily   cephALEXin  500 mg Oral Q12H   Chlorhexidine Gluconate Cloth  6 each Topical Daily   clopidogrel  75 mg Oral Daily   ezetimibe  10 mg Oral Daily   famotidine  40 mg Oral QHS   isosorbide mononitrate  30 mg Oral Daily   levothyroxine  88 mcg Oral Q0600   pantoprazole  40 mg Oral Daily   saccharomyces boulardii  250 mg Oral BID   tamsulosin  0.4 mg Oral QPC supper    Continuous Infusions:   PRN Meds: acetaminophen **OR** acetaminophen, bisacodyl, fentaNYL (SUBLIMAZE)  injection, HYDROcodone-acetaminophen, ipratropium-albuterol, loratadine, nitroGLYCERIN, ondansetron **OR** ondansetron (ZOFRAN) IV   Labs   Results for orders placed or performed during the hospital encounter of 12/05/21 (from the past 48 hour(s))  CBC with Differential     Status: Abnormal   Collection Time: 12/05/21 11:54 AM  Result Value Ref Range   WBC 8.9 4.0 - 10.5 K/uL   RBC 3.12 (L) 3.87 - 5.11 MIL/uL   Hemoglobin 8.7 (L) 12.0 - 15.0 g/dL   HCT 12/07/21 (L) 12.8 - 78.6 %   MCV 90.4 80.0 - 100.0 fL   MCH 27.9 26.0 - 34.0 pg   MCHC 30.9 30.0 - 36.0 g/dL   RDW 76.7 (H) 20.9 - 47.0 %   Platelets 544 (H) 150 - 400 K/uL   nRBC 0.0 0.0 - 0.2 %   Neutrophils Relative % 68 %   Neutro Abs 6.1 1.7 - 7.7 K/uL   Lymphocytes Relative 18 %   Lymphs Abs 1.6 0.7 - 4.0 K/uL   Monocytes Relative 9 %   Monocytes Absolute 0.8 0.1 - 1.0 K/uL   Eosinophils Relative 2 %   Eosinophils Absolute 0.1 0.0 - 0.5 K/uL   Basophils Relative 1 %   Basophils Absolute 0.1 0.0 - 0.1  K/uL   Immature Granulocytes 2 %   Abs Immature Granulocytes 0.21 (H) 0.00 - 0.07 K/uL    Comment: Performed at [facility], [address], [place], [place] [id number]  Basic metabolic panel     Status: Abnormal   Collection Time: 12/05/21 11:54 AM  Result Value Ref Range   Sodium 140 135 - 145 mmol/L   Potassium 3.2 (L) 3.5 - 5.1 mmol/L   Chloride 109 98 - 111 mmol/L   CO2 25 22 - 32 mmol/L   Glucose, Bld 189 (H) 70 - 99 mg/dL    Comment: Glucose reference range applies only to samples taken after fasting for at least 8 hours.   BUN 9 8 - 23 mg/dL   Creatinine, Ser 0.82 0.44 - 1.00 mg/dL   Calcium 8.8 (L) 8.9 - 10.3 mg/dL   GFR, Estimated >60 >60 mL/min    Comment: (NOTE) Calculated using the CKD-EPI Creatinine Equation (2021)    Anion gap 6 5 - 15    Comment: Performed at [facility], [address], [place], [place] [address]  Magnesium     Status: None   Collection Time: 12/05/21 11:54 AM  Result Value Ref Range    Magnesium 1.9 1.7 - 2.4 mg/dL    Comment: Performed at [facility], [address], [place], [place] [address]  Troponin I (High Sensitivity)     Status: Abnormal   Collection Time: 12/05/21 11:54 AM  Result Value Ref Range   Troponin I (High Sensitivity) 173 (HH) <18 ng/L    Comment: CRITICAL RESULT CALLED TO, READ BACK BY AND VERIFIED WITH: KRISTEN RUDD @ [id number] ON 12/05/21 C VARNER (NOTE) Elevated high sensitivity troponin I (hsTnI) values and significant  changes across serial measurements may suggest ACS but many other  chronic and acute conditions are known to elevate hsTnI results.  Refer to the Links section for chest pain algorithms and additional  guidance. Performed at [facility], [address] [place], [place] [address]   D-dimer, quantitative     Status: Abnormal   Collection Time: 12/05/21 11:54 AM  Result Value Ref Range   D-Dimer, Quant 1.91 (H) 0.00 - 0.50 ug/mL-FEU    Comment: (NOTE) At the manufacturer cut-off value of 0.5 g/mL FEU, this assay has a negative predictive value of 95-100%.This assay is intended for use in conjunction with a clinical pretest probability (PTP) assessment model to exclude pulmonary embolism (PE) and deep venous thrombosis (DVT) in outpatients suspected of PE or DVT. Results should be correlated with clinical presentation. Performed at [facility], [address] [place], [place] [address]   TSH     Status: Abnormal   Collection Time: 12/05/21 11:54 AM  Result Value Ref Range   TSH 8.232 (H) 0.350 - 4.500 uIU/mL    Comment: Performed by a 3rd Generation assay with a functional sensitivity of <=0.01 uIU/mL. Performed at [facility], [address] [place], [place] [address]   Brain natriuretic peptide (order ONLY if patient c/o SOB)     Status: Abnormal   Collection Time: 12/05/21 11:57 AM  Result Value Ref Range   B Natriuretic Peptide 579.0 (H) 0.0 - 100.0 pg/mL    Comment: Performed at [facility], [address],  [place], [place] [address]  Type and screen [facility]     Status: None   Collection Time: 12/05/21 12:36 PM  Result Value Ref Range   ABO/RH(D) O POS    Antibody Screen NEG  Sample Expiration      12/08/2021,2359 Performed at [facility], [address] [place], [place] [address]   Troponin I (High Sensitivity)     Status: Abnormal   Collection Time: 12/05/21  1:46 PM  Result Value Ref Range   Troponin I (High Sensitivity) 165 (HH) <18 ng/L    Comment: CRITICAL VALUE NOTED.  VALUE IS CONSISTENT WITH PREVIOUSLY REPORTED AND CALLED VALUE. (NOTE) Elevated high sensitivity troponin I (hsTnI) values and significant  changes across serial measurements may suggest ACS but many other  chronic and acute conditions are known to elevate hsTnI results.  Refer to the Links section for chest pain algorithms and additional  guidance. Performed at [facility], [address] [place], [place] [id number]   Basic metabolic panel     Status: Abnormal   Collection Time: 12/06/21  5:27 AM  Result Value Ref Range   Sodium 139 135 - 145 mmol/L   Potassium 4.1 3.5 - 5.1 mmol/L    Comment: DELTA CHECK NOTED   Chloride 107 98 - 111 mmol/L   CO2 26 22 - 32 mmol/L   Glucose, Bld 112 (H) 70 - 99 mg/dL    Comment: Glucose reference range applies only to samples taken after fasting for at least 8 hours.   BUN 8 8 - 23 mg/dL   Creatinine, Ser 0.76 0.44 - 1.00 mg/dL   Calcium 8.5 (L) 8.9 - 10.3 mg/dL   GFR, Estimated >60 >60 mL/min    Comment: (NOTE) Calculated using the CKD-EPI Creatinine Equation (2021)    Anion gap 6 5 - 15    Comment: Performed at [facility], [address] [place], [place] [address]  CBC     Status: Abnormal   Collection Time: 12/06/21  5:27 AM  Result Value Ref Range   WBC 7.8 4.0 - 10.5 K/uL   RBC 2.89 (L) 3.87 - 5.11 MIL/uL   Hemoglobin 8.2 (L) 12.0 - 15.0 g/dL   HCT 26.1 (L) 36.0 - 46.0 %   MCV 90.3 80.0 - 100.0 fL   MCH 28.4 26.0 - 34.0 pg   MCHC 31.4 30.0 - 36.0  g/dL   RDW 16.2 (H) 11.5 - 15.5 %   Platelets 520 (H) 150 - 400 K/uL   nRBC 0.3 (H) 0.0 - 0.2 %    Comment: Performed at [facility], [address], [place], [place] [address]  Magnesium     Status: None   Collection Time: 12/06/21  5:27 AM  Result Value Ref Range   Magnesium 1.9 1.7 - 2.4 mg/dL    Comment: Performed at [facility], [address] [place], [place] [address]  Lipid panel     Status: Abnormal   Collection Time: 12/06/21  5:27 AM  Result Value Ref Range   Cholesterol 123 0 - 200 mg/dL   Triglycerides 125 <150 mg/dL   HDL 31 (L) >40 mg/dL   Total CHOL/HDL Ratio 4.0 RATIO   VLDL 25 0 - 40 mg/dL   LDL Cholesterol 67 0 - 99 mg/dL    Comment:        Total Cholesterol/HDL:CHD Risk Coronary Heart Disease Risk Table                     Men   Women  1/2 Average Risk   3.4   3.3  Average Risk       5.0   4.4  2 X Average Risk   9.6   7.1  3 X Average Risk  23.4   11.0        Use the calculated Patient Ratio above and the CHD Risk Table to determine the patient's CHD Risk.        ATP III CLASSIFICATION (LDL):  <100     mg/dL   Optimal  675-916  mg/dL   Near or Above                    Optimal  130-159  mg/dL   Borderline  384-665  mg/dL   High  >993     mg/dL   Very High Performed at [facility], [address], [place], [place] [address]   Brain natriuretic peptide     Status: Abnormal   Collection Time: 12/06/21  5:27 AM  Result Value Ref Range   B Natriuretic Peptide 415.0 (H) 0.0 - 100.0 pg/mL    Comment: Performed at [facility], [address], [place], [place] [address]  Glucose, capillary     Status: Abnormal   Collection Time: 12/06/21  7:10 AM  Result Value Ref Range   Glucose-Capillary 118 (H) 70 - 99 mg/dL    Comment: Glucose reference range applies only to samples taken after fasting for at least 8 hours.  T4, free     Status: Abnormal   Collection Time: 12/06/21 12:45 PM  Result Value Ref Range   Free T4 1.48 (H) 0.61 - 1.12 ng/dL    Comment:  (NOTE) Biotin ingestion may interfere with free T4 tests. If the results are inconsistent with the TSH level, previous test results, or the clinical presentation, then consider biotin interference. If needed, order repeat testing after stopping biotin. Performed at [facility], [address], [place], [place] [address]   Basic metabolic panel     Status: Abnormal   Collection Time: 12/07/21  5:21 AM  Result Value Ref Range   Sodium 139 135 - 145 mmol/L   Potassium 3.5 3.5 - 5.1 mmol/L   Chloride 105 98 - 111 mmol/L   CO2 27 22 - 32 mmol/L   Glucose, Bld 121 (H) 70 - 99 mg/dL    Comment: Glucose reference range applies only to samples taken after fasting for at least 8 hours.   BUN 9 8 - 23 mg/dL   Creatinine, Ser 3.30 0.44 - 1.00 mg/dL   Calcium 8.8 (L) 8.9 - 10.3 mg/dL   GFR, Estimated >07 >62 mL/min    Comment: (NOTE) Calculated using the CKD-EPI Creatinine Equation (2021)    Anion gap 7 5 - 15    Comment: Performed at [facility], [address], [place], [place] [address]  Magnesium     Status: None   Collection Time: 12/07/21  5:21 AM  Result Value Ref Range   Magnesium 2.0 1.7 - 2.4 mg/dL    Comment: Performed at [facility], [address], [place], [place] [address]  Brain natriuretic peptide     Status: Abnormal   Collection Time: 12/07/21  5:21 AM  Result Value Ref Range   B Natriuretic Peptide 212.0 (H) 0.0 - 100.0 pg/mL    Comment: Performed at [facility], [address], [place], [place] [address]    ECG   N/A  Telemetry   Sinus rhythm - Personally Reviewed  Radiology    DG Chest Portable 1 View  Result Date: 12/05/2021 CLINICAL DATA:  81 year old female with dyspnea EXAM: PORTABLE CHEST 1 VIEW COMPARISON:  Radiographs 11/22/2021 FINDINGS: Stable  heart size and mediastinal contours status post median sternotomy. Aortic atherosclerosis. Patchy opacities in the left-greater-than-right lung bases. Small left pleural effusion. No pneumothorax.  IMPRESSION: 1. Left-greater-than-right basilar airspace opacities are presumed atelectasis though pneumonia is difficult to exclude. Small left pleural effusion. 2. Aortic atherosclerosis post median sternotomy. Electronically Signed   By: Placido Sou M.D.   On: 12/05/2021 12:23    Cardiac Studies   N/A  Assessment   Principal Problem:   Generalized weakness Active Problems:   Multiple vessel coronary artery disease => CABG (LIMA-dLAD, SVG-Diag, Seq SVG-OM-OM-PDA)   Essential hypertension   Hypothyroidism   Hyperlipidemia LDL goal <70   Hx of CABG x 5   Carotid artery disease (HCC)   Hypokalemia   COPD (chronic obstructive pulmonary disease) (HCC)   Incomplete bladder emptying   Status post coronary artery stent placement   Ischemic cardiomyopathy   Atelectasis   Emphysematous cystitis   Plan   Feels well today- walked all around the floor yesterday. Breathing is better. Home lasix was listed as PRN. Will restart lasix 40 mg daily. BP will not allow further uptitration of meds. With indwelling foley, avoid SGLT2i for now.  No further cardiac recommendations at this time. Will sign-off.  [facility] will sign off.   Medication Recommendations:  as above Other recommendations (labs, testing, etc):  none Follow up as an outpatient:  Dr. Domenic Polite   Time Spent Directly with Patient:  I have spent a total of 25 minutes with the patient reviewing hospital notes, telemetry, EKGs, labs and examining the patient as well as establishing an assessment and plan that was discussed personally with the patient.  > 50% of time was spent in direct patient care.  Length of Stay:  LOS: 0 days   Pixie Casino, MD, [facility], [place] Director of the Advanced Lipid Disorders &  Cardiovascular Risk Reduction Clinic Diplomate of the American Board of Clinical Lipidology Attending Cardiologist  Direct Dial: [phone]  Fax: [phone]  Website:   [email or web address]  Nadean Corwin Hatsumi Steinhart 12/07/2021, 9:12 AM

## 2021-12-07 NOTE — Assessment & Plan Note (Signed)
-   pt diuresed well with IV lasix and potassium supplement - cardiology recommending lasix 40 mg oral daily with potassium supplement  Filed Weights   12/05/21 1929 12/06/21 0608 12/07/21 0524  Weight: 57.7 kg 56.6 kg 59.7 kg   I/O last 3 completed shifts: In: 1440 [P.O.:1440] Out: 1850 [Urine:1850] Total I/O In: 240 [P.O.:240] Out: -

## 2021-12-07 NOTE — Discharge Summary (Addendum)
Physician Discharge Summary  Debbie [name] UKG:[id number] DOB: 1941/04/08 DOA: 12/05/2021  PCP: Caryl Bis, MD Cardiology: [facility]  Admit date: 12/05/2021 Discharge date: 12/07/2021  Admitted From:  Home  Disposition: Home   Recommendations for Outpatient Follow-up:  Follow up with PCP in 1 weeks Follow up with cardiology in 1-2 weeks Consider outpatient palliative services Please check BMP in 1 week to follow up electrolytes  Home Health: outpatient PT arranged  Discharge Condition: Frail but stable   CODE STATUS: Full  DIET: heart healthy low sodium    Brief Hospitalization Summary: Please see all hospital notes, images, labs for full details of the hospitalization. 81 year old female with multiple recent hospitalizations for coronary artery disease recently discharged on 12/03/2021 after being admitted for non-ST elevation MI that developed into an ST elevation without angina and taken for cardiac catheterization on 11/24/2021.  She has a history of ischemic cardiomyopathy with an EF of 25 to 30%.  During this recent hospitalization she had a drug-eluting stent placed.  She has an LV thrombus and she is fully anticoagulated with apixaban.  Patient reports that since being at home that she thought she was going to have 24/7 care takers to assist her but apparently has had very limited assistance.  She reports that she has become increasingly weak and reports she can barely stand and walk 2 steps without having to sit down.  She reports dyspnea and not able to catch breath.  She denies wheezing.  She denies cough and fever.  She presented to the emergency department due to dyspnea.  Patient reports that she was too weak to go home.  She cannot care for herself.  She does not have enough assistance at home.  She is agreeable to rehabilitation if possible.  Patient reports she did not take any of her medications today because she was coming to the hospital.  Patient denies  chest pain.  Patient denies palpitations.  Patient denies hematuria.  Patient is being admitted for observation.  Cardiology is going to evaluate her as well while she is here.  HOSPITAL COURSE BY PROBLEM   * Acute HFrEF (heart failure with reduced ejection fraction) ([place])  - pt diuresed well with IV lasix and potassium supplement - cardiology recommending lasix 40 mg oral daily with potassium supplement  Filed Weights   12/05/21 1929 12/06/21 0608 12/07/21 0524  Weight: 57.7 kg 56.6 kg 59.7 kg   I/O last 3 completed shifts: In: 1440 [P.O.:1440] Out: 1850 [Urine:1850] Total I/O In: 240 [P.O.:240] Out: -      Emphysematous cystitis - pt completing a 10 day course of oral cephalexin as recommended by urologist that saw her during last hospitalization.  She has outpatient follow up with urologist  Atelectasis - Secondary to immobility and deconditioning, incentive spirometry ordered and PT evaluation and orders to ambulate patient up to chair and in room requested -outpatient PT has been arranged as recommended by PT  Ischemic cardiomyopathy - EF 25 to 30% - IV Lasix given with potassium supplement with good results.  Pt will now take lasix 40 mg po daily.  Status post coronary artery stent placement - Patient remains on apixaban and Plavix with aspirin having being discontinued during last hospitalization.  Incomplete bladder emptying --pt currently has indwelling foley cath in place, with outpatient follow up with urologist   COPD (chronic obstructive pulmonary disease) ([place]) - Stable no wheezing noted, bronchodilators as needed  Generalized weakness - PT recommending outpatient PT which is arranged  Hypokalemia - Oral replacement given and repleted.   Carotid artery disease ([place]) - See recommendations noted, continue medical management, follow up with cardiology outpatient  Hx of CABG x 5 - Patient has significant coronary artery disease, continue all home cardiac  medications  Hyperlipidemia LDL goal <70 LDL goal less than 70 Continue home atorvastatin 80 mg daily  Hypothyroidism - resume home levothyroxine daily.  Essential hypertension - cardiology has seen her and recommending she go back to bisoprolol 2.5 mg daily   Multiple vessel coronary artery disease => CABG (LIMA-dLAD, SVG-Diag, Seq SVG-OM-OM-PDA) - Appreciate cardiology consultation and recommendations   Discharge Diagnoses:  Principal Problem:   Acute HFrEF (heart failure with reduced ejection fraction) ([place]) Active Problems:   Multiple vessel coronary artery disease => CABG (LIMA-dLAD, SVG-Diag, Seq SVG-OM-OM-PDA)   Essential hypertension   Hypothyroidism   Hyperlipidemia LDL goal <70   Hx of CABG x 5   Carotid artery disease ([facility])   Hypokalemia   Generalized weakness   COPD (chronic obstructive pulmonary disease) ([place])   Incomplete bladder emptying   Status post coronary artery stent placement   Ischemic cardiomyopathy   Atelectasis   Emphysematous cystitis   Discharge Instructions: Discharge Instructions     Ambulatory referral to Physical Therapy   Complete by: As directed       Allergies as of 12/07/2021       Reactions   Ivp Dye [iodinated Contrast Media] Swelling   Kidney Dye   Betadine [povidone Iodine] Rash   Codeine Nausea And Vomiting   Other Nausea And Vomiting   Narcotics - nausea,vomiting   Penicillins Rash   Did it involve swelling of the face/tongue/throat, SOB, or low BP? No Did it involve sudden or severe rash/hives, skin peeling, or any reaction on the inside of your mouth or nose? No Did you need to seek medical attention at a hospital or doctor's office? No When did it last happen?       If all above answers are "NO", may proceed with cephalosporin use.   Sulfa Antibiotics Rash        Medication List     STOP taking these medications    metoprolol succinate 25 MG 24 hr tablet Commonly known as: TOPROL-XL    umeclidinium-vilanterol 62.5-25 MCG/INH Aepb Commonly known as: ANORO ELLIPTA       TAKE these medications    acetaminophen 650 MG CR tablet Commonly known as: TYLENOL Take 650 mg by mouth in the morning.   atorvastatin 80 MG tablet Commonly known as: LIPITOR Take 1 tablet (80 mg total) by mouth at bedtime.   bisoprolol 5 MG tablet Commonly known as: ZEBETA Take 0.5 tablets (2.5 mg total) by mouth daily. Start taking on: December 08, 2021   cephALEXin 500 MG capsule Commonly known as: KEFLEX Take 1 capsule (500 mg total) by mouth every 12 (twelve) hours for 6 days.   clopidogrel 75 MG tablet Commonly known as: PLAVIX Take 1 tablet (75 mg total) by mouth daily.   Eliquis 2.5 MG Tabs tablet Generic drug: apixaban Take 1 tablet (2.5 mg total) by mouth 2 (two) times daily.   ezetimibe 10 MG tablet Commonly known as: ZETIA Take 1 tablet (10 mg total) by mouth daily.   famotidine 20 MG tablet Commonly known as: PEPCID Take 40 mg by mouth at bedtime.   furosemide 40 MG tablet Commonly known as: LASIX Take 1 tablet (40 mg total) by mouth daily. Start taking on: December 08, 2021 What  changed:  when to take this reasons to take this   isosorbide mononitrate 30 MG 24 hr tablet Commonly known as: IMDUR Take 1 tablet (30 mg total) by mouth daily. Start taking on: December 08, 2021   levocetirizine 5 MG tablet Commonly known as: XYZAL Take 5 mg by mouth every evening. Equate levocetirizine.   levothyroxine 88 MCG tablet Commonly known as: SYNTHROID Take 88 mcg by mouth daily.   nitroGLYCERIN 0.4 MG SL tablet Commonly known as: NITROSTAT Place 1 tablet (0.4 mg total) under the tongue every 5 (five) minutes x 3 doses as needed for chest pain (if no relief after 3rd dose, call 911 or proceed to the ED for an evaluation).   ondansetron 4 MG tablet Commonly known as: ZOFRAN Take 1 tablet (4 mg total) by mouth every 6 (six) hours as needed for nausea.   pantoprazole 40 MG  tablet Commonly known as: PROTONIX Take 40 mg by mouth daily.   potassium chloride 10 MEQ tablet Commonly known as: KLOR-CON M Take 1 tablet (10 mEq total) by mouth daily. What changed:  how much to take when to take this reasons to take this   saccharomyces boulardii 250 MG capsule Commonly known as: FLORASTOR Take 1 capsule (250 mg total) by mouth 2 (two) times daily.   tamsulosin 0.4 MG Caps capsule Commonly known as: FLOMAX Take 0.4 mg by mouth.        Follow-up Information     Caryl Bis, MD. Schedule an appointment as soon as possible for a visit in 1 week(s).   Specialty: Family Medicine Why: Hospital Follow Up Contact information: [place] [address] [phone]         Erma Heritage, [place]. Go on 12/12/2021.   Specialties: Physician Assistant, Cardiology Why: Hospital Follow Up Contact information: [place] [address] [phone]         Cleon Gustin, MD. Go on 12/10/2021.   Specialty: Urology Why: Hospital Follow Up Contact information: [address]  [place] [address] [phone]                Allergies  Allergen Reactions   Ivp Dye [Iodinated Contrast Media] Swelling    Kidney Dye   Betadine [Povidone Iodine] Rash   Codeine Nausea And Vomiting   Other Nausea And Vomiting    Narcotics - nausea,vomiting   Penicillins Rash    Did it involve swelling of the face/tongue/throat, SOB, or low BP? No Did it involve sudden or severe rash/hives, skin peeling, or any reaction on the inside of your mouth or nose? No Did you need to seek medical attention at a hospital or doctor's office? No When did it last happen?       If all above answers are "NO", may proceed with cephalosporin use.    Sulfa Antibiotics Rash   Allergies as of 12/07/2021       Reactions   Ivp Dye [iodinated Contrast Media] Swelling   Kidney Dye   Betadine [povidone Iodine] Rash   Codeine Nausea And  Vomiting   Other Nausea And Vomiting   Narcotics - nausea,vomiting   Penicillins Rash   Did it involve swelling of the face/tongue/throat, SOB, or low BP? No Did it involve sudden or severe rash/hives, skin peeling, or any reaction on the inside of your mouth or nose? No Did you need to seek medical attention at a hospital or doctor's office? No When did  it last happen?       If all above answers are "NO", may proceed with cephalosporin use.   Sulfa Antibiotics Rash        Medication List     STOP taking these medications    metoprolol succinate 25 MG 24 hr tablet Commonly known as: TOPROL-XL   umeclidinium-vilanterol 62.5-25 MCG/INH Aepb Commonly known as: ANORO ELLIPTA       TAKE these medications    acetaminophen 650 MG CR tablet Commonly known as: TYLENOL Take 650 mg by mouth in the morning.   atorvastatin 80 MG tablet Commonly known as: LIPITOR Take 1 tablet (80 mg total) by mouth at bedtime.   bisoprolol 5 MG tablet Commonly known as: ZEBETA Take 0.5 tablets (2.5 mg total) by mouth daily. Start taking on: December 08, 2021   cephALEXin 500 MG capsule Commonly known as: KEFLEX Take 1 capsule (500 mg total) by mouth every 12 (twelve) hours for 6 days.   clopidogrel 75 MG tablet Commonly known as: PLAVIX Take 1 tablet (75 mg total) by mouth daily.   Eliquis 2.5 MG Tabs tablet Generic drug: apixaban Take 1 tablet (2.5 mg total) by mouth 2 (two) times daily.   ezetimibe 10 MG tablet Commonly known as: ZETIA Take 1 tablet (10 mg total) by mouth daily.   famotidine 20 MG tablet Commonly known as: PEPCID Take 40 mg by mouth at bedtime.   furosemide 40 MG tablet Commonly known as: LASIX Take 1 tablet (40 mg total) by mouth daily. Start taking on: December 08, 2021 What changed:  when to take this reasons to take this   isosorbide mononitrate 30 MG 24 hr tablet Commonly known as: IMDUR Take 1 tablet (30 mg total) by mouth daily. Start taking on: December 08, 2021   levocetirizine 5 MG tablet Commonly known as: XYZAL Take 5 mg by mouth every evening. Equate levocetirizine.   levothyroxine 88 MCG tablet Commonly known as: SYNTHROID Take 88 mcg by mouth daily.   nitroGLYCERIN 0.4 MG SL tablet Commonly known as: NITROSTAT Place 1 tablet (0.4 mg total) under the tongue every 5 (five) minutes x 3 doses as needed for chest pain (if no relief after 3rd dose, call 911 or proceed to the ED for an evaluation).   ondansetron 4 MG tablet Commonly known as: ZOFRAN Take 1 tablet (4 mg total) by mouth every 6 (six) hours as needed for nausea.   pantoprazole 40 MG tablet Commonly known as: PROTONIX Take 40 mg by mouth daily.   potassium chloride 10 MEQ tablet Commonly known as: KLOR-CON M Take 1 tablet (10 mEq total) by mouth daily. What changed:  how much to take when to take this reasons to take this   saccharomyces boulardii 250 MG capsule Commonly known as: FLORASTOR Take 1 capsule (250 mg total) by mouth 2 (two) times daily.   tamsulosin 0.4 MG Caps capsule Commonly known as: FLOMAX Take 0.4 mg by mouth.        Procedures/Studies: DG Chest Portable 1 View  Result Date: 12/05/2021 CLINICAL DATA:  81 year old female with dyspnea EXAM: PORTABLE CHEST 1 VIEW COMPARISON:  Radiographs 11/22/2021 FINDINGS: Stable heart size and mediastinal contours status post median sternotomy. Aortic atherosclerosis. Patchy opacities in the left-greater-than-right lung bases. Small left pleural effusion. No pneumothorax. IMPRESSION: 1. Left-greater-than-right basilar airspace opacities are presumed atelectasis though pneumonia is difficult to exclude. Small left pleural effusion. 2. Aortic atherosclerosis post median sternotomy. Electronically Signed   By: Placido Sou  M.D.   On: 12/05/2021 12:23   CT ABDOMEN PELVIS W WO CONTRAST  Result Date: 11/30/2021 CLINICAL DATA:  Gross hematuria. EXAM: CT ABDOMEN AND PELVIS WITHOUT AND WITH CONTRAST TECHNIQUE:  Multidetector CT imaging of the abdomen and pelvis was performed following the standard protocol before and following the bolus administration of intravenous contrast. RADIATION DOSE REDUCTION: This exam was performed according to the departmental dose-optimization program which includes automated exposure control, adjustment of the mA and/or kV according to patient size and/or use of iterative reconstruction technique. CONTRAST:  130m OMNIPAQUE IOHEXOL 300 MG/ML  SOLN COMPARISON:  Noncontrast CT on 10/17/2021 FINDINGS: Lower Chest: Small left pleural effusion and tiny right pleural effusion are new since prior exam. Hepatobiliary: No hepatic masses identified. Gallstones are seen, however there is no evidence of cholecystitis or biliary dilatation. Pancreas:  No mass or inflammatory changes. Spleen: Within normal limits in size and appearance. Adrenals/Urinary Tract: No masses identified. Small benign-appearing right renal cyst again noted (no followup imaging recommended). No evidence of ureteral calculi or hydronephrosis. The urinary bladder is distended and gas is seen throughout the bladder wall, consistent with emphysematous cystitis. Air in the bladder lumen is likely from recent instrumentation. Stomach/Bowel: Small hiatal hernia is again seen. No evidence of obstruction, inflammatory process or abnormal fluid collections. Diverticulosis is seen mainly involving the sigmoid colon, however there is no evidence of diverticulitis. Vascular/Lymphatic: No pathologically enlarged lymph nodes. No acute vascular findings. Aortic atherosclerotic calcification incidentally noted. Reproductive: Prior hysterectomy noted. Adnexal regions are unremarkable in appearance. Other: A small midline epigastric ventral hernia is again seen, which contains only fat. Musculoskeletal:  No suspicious bone lesions identified. IMPRESSION: Acute emphysematous cystitis. No evidence of urinary tract neoplasm, ureteral calculi, or  hydronephrosis. Cholelithiasis. No radiographic evidence of cholecystitis. Colonic diverticulosis, without radiographic evidence of diverticulitis. Stable small hiatal hernia and epigastric ventral hernia. New small left and tiny right pleural effusions. Aortic Atherosclerosis (ICD10-I70.0) and Emphysema (ICD10-J43.9). These results will be called to the ordering clinician or representative by the Radiologist Assistant, and communication documented in the PACS or [employer] Electronically Signed   By: JMarlaine HindM.D.   On: 11/30/2021 19:10   ECHOCARDIOGRAM LIMITED  Result Date: 11/27/2021    ECHOCARDIOGRAM LIMITED REPORT   Patient Name:   Debbie KNEZEVICDate of Exam: 11/27/2021 Medical Rec #:  [id number]          Height:       62.0 in Accession #:    [id number]         Weight:       133.2 lb Date of Birth:  905/27/1942          BSA:          1.608 m Patient Age:    89years            BP:           125/71 mmHg Patient Gender: F                   HR:           75 bpm. Exam Location:  Inpatient Procedure: Limited Echo and Intracardiac Opacification Agent Indications:    CHF  History:        Patient has prior history of Echocardiogram examinations, most                 recent 11/24/2021. CAD and NSTEMI, Prior CABG, COPD; Risk  Factors:Dyslipidemia and Former Smoker.  Sonographer:    Merrie Roof RDCS Referring Phys: [place]  1. Follow up limited study with Definity shows persistent apical left ventricular thrombus. The clot now appears fixed, although with a relatively narrow attachment. Width 7 mm, length 13 mm.. Left ventricular ejection fraction, by estimation, is 25 to 30%. The left ventricle has severely decreased function. FINDINGS  Left Ventricle: Follow up limited study with Definity shows persistent apical left ventricular thrombus. The clot now appears fixed, although with a relatively narrow attachment. Width 7 mm, length 13 mm. Left ventricular ejection  fraction, by estimation, is 25 to 30%. The left ventricle has severely decreased function.  LV Wall Scoring: The apical anterior segment, apical inferior segment, and apex are dyskinetic. The mid anteroseptal segment, apical lateral segment, and mid anterior segment are akinetic. The apical septal segment is hypokinetic. The antero-lateral wall, inferior wall, posterior wall, basal anteroseptal segment, mid inferoseptal segment, basal anterior segment, and basal inferoseptal segment are normal. Pericardium: There is no evidence of pericardial effusion.  LV Volumes (MOD) LV vol d, MOD A2C: 39.3 ml LV vol d, MOD A4C: 76.5 ml LV vol s, MOD A2C: 27.9 ml LV vol s, MOD A4C: 38.1 ml LV SV MOD A2C:     11.4 ml LV SV MOD A4C:     76.5 ml LV SV MOD BP:      24.3 ml Dani Gobble Croitoru MD Electronically signed by Sanda Klein MD Signature Date/Time: 11/27/2021/12:18:50 PM    Final    VAS [place] GROIN PSEUDOANEURYSM  Result Date: 11/26/2021  ARTERIAL PSEUDOANEURYSM  Patient Name:  Debbie [name]  Date of Exam:   11/25/2021 Medical Rec #: [id number]            Accession #:    [id number] Date of Birth: 10-09-1940            Patient Gender: F Patient Age:   48 years Exam [facility] Procedure:      VAS [place] GROIN PSEUDOANEURYSM Referring Phys: [place] --------------------------------------------------------------------------------  Exam: Right groin Indications: Patient complains of groin pain and bruising. History: S/p catheterization 11/23/21 and 11/24/21. Comparison Study: No prior study on file Performing Technologist: Sharion Dove RVS  Examination Guidelines: A complete evaluation includes B-mode imaging, spectral Doppler, color Doppler, and power Doppler as needed of all accessible portions of each vessel. Bilateral testing is considered an integral part of a complete examination. Limited examinations for reoccurring indications may be performed as noted.  Summary: No evidence of pseudoaneurysm, AVF or DVT   Diagnosing physician: Monica Martinez MD Electronically signed by Monica Martinez MD on 11/26/2021 at 1:41:57 PM.   --------------------------------------------------------------------------------    Final    CARDIAC CATHETERIZATION  Result Date: 11/24/2021 Images from the original result were not included.   Dist LM to Prox LAD lesion is 100% stenosed.   Ost LM to Dist LM lesion is 25% stenosed.   Dist Cx lesion is 50% stenosed.   Origin to Prox Graft lesion is 99% stenosed.   Previously placed Ost Cx to Prox Cx stent of unknown type is  widely patent.   A drug-eluting stent was successfully placed using a STENT ONYX FRONTIER [id number].   Post intervention, there is a 0% residual stenosis. HAYLEEN CLINKSCALES is a 81 y.o. female  [id number] LOCATION:  FACILITY: [facility] PHYSICIAN: Quay Burow, M.D. February 23, 1941 DATE OF PROCEDURE:  11/24/2021 DATE OF DISCHARGE: CARDIAC CATHETERIZATION / PCI DES Diag SVG History obtained  from chart review.SADDIE SANDEEN is a 81 y.o. female with CAD status post CABG, hypertension, hyperlipidemia who was admitted on 11/22/2021 for non-STEMI.  Underwent left heart catheterization with PCI in the left main into left circumflex.  Course complicated on 12/24/5782 with anterior ST elevation, severe LV dysfunction with an LAD wall motion abnormality.  Because of this Dr. Audie Box  felt that she should have relook coronary angiography.  The patient was however asymptomatic. PROCEDURE DESCRIPTION: The patient was brought to the second floor [facility]iac cath lab in the postabsorptive state.  She was premedicated with IV Solu-Medrol and Benadryl for contrast allergy prophylaxis.  Her right groin was prepped and shaved in usual sterile fashion. Xylocaine 1% was used for local anesthesia. A 5 French sheath was inserted into the right common femoral artery using standard Seldinger technique.  Ultrasound was used to identify the right common femoral artery and guide access.  A digital image was  captured and placed the patient's chart.  5 French right left second Silastic catheters were used for selective coronary angiography, selective vein graft and IMA graft angiography and obtain left heart pressures.  Isovue dye was used for the entirety of the case (95 cc of Contrast total to patient).  Retrograde aortic, ventricular and pullback pressures were recorded.  The LVEDP was measured at 31 mmHg. Her left main into proximal circumflex stent was widely patent.  The LAD was occluded at the origin which was an old finding.  Her LIMA was small in caliber and was somewhat atretic.  The distal LAD was small after the anastomosis.  The nondominant right was not visualized.  The sequential vein to an obtuse marginal branch and PDA was widely patent.  The vein to the diagonal branch was subtotally occluded with a 99% stenosis and TIMI I flow.  This did fill a diagonal branch and backfill the LAD.  I assume that this was the "culprit vessel", and plan is to perform PCI drug-eluting stenting. The patient was already on aspirin and Brilinta.  She received 7000's of heparin with an ACT of 420.  Isovue dye is used for the entirety of the case.  Retroaortic pressures monitored to the case.  I crossed the proximal diagonal vein graft lesion with some difficulty.  I predilated with a 2 mm balloon.  There did appear to be a filling defect beyond the stenosis suggestive of thrombus.  I attempted to place a 4 mm spider distal protection device but this did not pass the proximal lesion and therefore I decided simply to stent and cover the angiographic thrombus.  I placed a 3 mm x 18 mm long Medtronic Onyx frontier drug-eluting stent from the ostium of the vein graft beyond the lesion in the incorporating the filling defect deployed at 14 atm (3.08 mm) resulting in reduction of a 99% proximal diagonal branch SV's G stenosis with TIMI I flow to 0% residual TIMI-3 flow.  There is no residual filling defect nor did there appear to  be distal thromboembolic disease.  The guidewire and catheter were removed.  The sheath was was secured in place.  The patient did receive a bolus of Aggrastat and a brief infusion which was discontinued.   Widely patent left main into the circumflex stent performed yesterday by Dr. Burt Knack.  Atretic appearing LIMA to a small distal LAD.  Patent sequential vein to an OM and PDA with high-grade proximal SVG to diagonal vein graft stenosis probably representing the "culprit lesion with an  excellent angiographic result after PCI drug-eluting stenting.  Hopefully her anterior wall stunned and will come back.  The sheath will be removed once ACT falls below 170 pressure held.  I am going to restart heparin 4 hours after sheath removal for 24 hours.  Given her elevated LVEDP, she probably should be quire some diuresis as well.  She left the lab in stable condition. Quay Burow. MD, [facility] 11/24/2021 12:51 PM    ECHOCARDIOGRAM LIMITED  Result Date: 11/24/2021    ECHOCARDIOGRAM LIMITED REPORT   Patient Name:   Debbie [name] Date of Exam: 11/24/2021 Medical Rec #:  [id number]           Height:       62.5 in Accession #:    [id number]          Weight:       128.3 lb Date of Birth:  03-Feb-1941           BSA:          1.592 m Patient Age:    70 years            BP:           115/91 mmHg Patient Gender: F                   HR:           73 bpm. Exam Location:  Inpatient Procedure: Limited Echo, Cardiac Doppler, Color Doppler and Intracardiac            Opacification Agent Indications:    NSTEMI I21.4  History:        Patient has prior history of Echocardiogram examinations, most                 recent 10/19/2021. CAD and NSTEMI, Prior CABG, COPD,                 Signs/Symptoms:Chest Pain; Risk Factors:Dyslipidemia and Former                 Smoker. Successful PCI of the native left main and native                 circumflex 11/23/21.  Sonographer:    Darlina Sicilian RDCS Referring Phys: Eleonore Chiquito, Marcello Moores  Sonographer  Comments: Severe native vessel coronary artery disease with severe ostial left main stenosis, total occlusion of the LAD, severe stenosis in the distal left main/circumflex junction, severe diffuse stenosis of the distal AV circumflex, and a diffusely diseased nondominant RCA. IMPRESSIONS  1. Suspect small 0.5 x 1 mm mobile thrombus noted at the inferior apex (image 38) by Definity contrast - the enterior anterior wall and apex are akinetic. Left ventricular ejection fraction, by estimation, is 25 to 30%. The left ventricle has severely decreased function. The left ventricle demonstrates regional wall motion abnormalities (see scoring diagram/findings for description). There is severe akinesis of the left ventricular, entire anterior wall, anteroseptal wall, apical segment and inferoapical segment.  2. Right ventricular systolic function is low normal. The right ventricular size is normal. There is moderately elevated pulmonary artery systolic pressure.  3. The mitral valve is abnormal. Mild to moderate mitral valve regurgitation.  4. The aortic valve is tricuspid. Aortic valve regurgitation is not visualized. Comparison(s): Changes from prior study are noted. 10/19/2021: LVEF 60-65%. Conclusion(s)/Recommendation(s): Critical findings reported to Dr. Audie Box and acknowledged at 11:59 am. FINDINGS  Left Ventricle: Suspect small 0.5 x 1 mm mobile thrombus noted at  the inferior apex (image 38) by Definity contrast - the enterior anterior wall and apex are akinetic. Left ventricular ejection fraction, by estimation, is 25 to 30%. The left ventricle has severely decreased function. The left ventricle demonstrates regional wall motion abnormalities. Severe akinesis of the left ventricular, entire anterior wall, anteroseptal wall, apical segment and inferoapical segment. Definity contrast agent was given IV to delineate the left ventricular endocardial borders. Right Ventricle: The right ventricular size is normal. No  increase in right ventricular wall thickness. Right ventricular systolic function is low normal. There is moderately elevated pulmonary artery systolic pressure. The tricuspid regurgitant velocity  is 3.06 m/s, and with an assumed right atrial pressure of 8 mmHg, the estimated right ventricular systolic pressure is 75.6 mmHg. Mitral Valve: The mitral valve is abnormal. There is mild calcification of the anterior and posterior mitral valve leaflet(s). Mild to moderate mitral valve regurgitation. Tricuspid Valve: The tricuspid valve is grossly normal. Tricuspid valve regurgitation is mild. Aortic Valve: The aortic valve is tricuspid. Aortic valve regurgitation is not visualized. Pulmonic Valve: The pulmonic valve was grossly normal. Pulmonic valve regurgitation is trivial.  LV Volumes (MOD) LV vol d, MOD A2C: 89.5 ml LV vol d, MOD A4C: 101.0 ml LV vol s, MOD A2C: 55.4 ml LV vol s, MOD A4C: 68.3 ml LV SV MOD A2C:     34.1 ml LV SV MOD A4C:     101.0 ml LV SV MOD BP:      33.9 ml AORTIC VALVE             PULMONIC VALVE LVOT Vmax:   53.20 cm/s  RVOT Peak grad: 1 mmHg LVOT Vmean:  33.200 cm/s LVOT VTI:    0.081 m TRICUSPID VALVE TR Peak grad:   37.5 mmHg TR Vmax:        306.00 cm/s  SHUNTS Systemic VTI: 0.08 m Pulmonic VTI: 0.053 m Lyman Bishop MD Electronically signed by Lyman Bishop MD Signature Date/Time: 11/24/2021/12:01:54 PM    Final    CARDIAC CATHETERIZATION  Result Date: 11/23/2021 1.  Severe native vessel coronary artery disease with severe ostial left main stenosis, total occlusion of the LAD, severe stenosis in the distal left main/circumflex junction, severe diffuse stenosis of the distal AV circumflex, and a diffusely diseased nondominant RCA 2.  Status post aortocoronary bypass surgery with continued patency of the LIMA to LAD and occlusion of the saphenous vein grafts 3.  Successful PCI of the native left main and native circumflex using a 4.0 x 24 mm Synergy DES Recommendation: Dual antiplatelet  therapy with aspirin and ticagrelor at least 12 months, aggressive risk reduction measures, suspect the patient occluded a vein graft causing her acute coronary syndrome.  There were no vein graft targets for PCI.  Her native left main into the circumflex is treated as this large territory is now potentially ischemic after occlusion of the sequential saphenous vein graft to OM and PLA.   DG Chest Port 1 View  Result Date: 11/22/2021 CLINICAL DATA:  Chest pain with weakness. EXAM: PORTABLE CHEST 1 VIEW COMPARISON:  Radiographs 12/31/2018 and 10/17/2021. Abdominal CT 10/17/2021. FINDINGS: 1644 hours. The heart size and mediastinal contours are stable status post median sternotomy. There is aortic atherosclerosis and a small hiatal hernia. Lower lung volumes with mildly increased patchy opacities at both lung bases, likely reflecting atelectasis. No confluent airspace opacity, significant pleural effusion or pneumothorax. No acute osseous findings are evident. Glenohumeral degenerative changes bilaterally and a thoracolumbar scoliosis. Telemetry leads overlie  the chest. IMPRESSION: 1. Lower lung volumes with probable resulting mild bibasilar atelectasis. No confluent airspace opacity or significant pleural effusion. 2. Aortic atherosclerosis post median sternotomy. Electronically Signed   By: Richardean Sale M.D.   On: 11/22/2021 16:58     Subjective: Pt sitting up in chair, she has been ambulating with assistance, she is feeling much better, agreeable to outpatient PT, no SOB, no CP  Discharge Exam: Vitals:   12/07/21 0347 12/07/21 1020  BP: (!) 95/54 94/64  Pulse: 82 87  Resp: 20   Temp: 98.1 F (36.7 C)   SpO2: 96%    Vitals:   12/06/21 2005 12/07/21 0347 12/07/21 0524 12/07/21 1020  BP: (!) 95/52 (!) 95/54  94/64  Pulse: 80 82  87  Resp: 20 20    Temp: 98.5 F (36.9 C) 98.1 F (36.7 C)    TempSrc: Oral Oral    SpO2: 97% 96%    Weight:   59.7 kg   Height:       General: Pt is very  frail, elderly, alert, awake, not in acute distress Cardiovascular: RRR, S1/S2 +, no rubs, no gallops Respiratory: CTA bilaterally, no wheezing, no rhonchi Abdominal: Soft, NT, ND, bowel sounds + Extremities: no edema, no cyanosis   The results of significant diagnostics from this hospitalization (including imaging, microbiology, ancillary and laboratory) are listed below for reference.     Microbiology: Recent Results (from the past 240 hour(s))  Urine Culture     Status: Abnormal   Collection Time: 11/30/21  8:41 PM   Specimen: Urine, Clean Catch  Result Value Ref Range Status   Specimen Description URINE, CLEAN CATCH  Final   Special Requests   Final    NONE Performed at [facility], [place] [address], [place], [place] [address]    Culture MULTIPLE SPECIES PRESENT, SUGGEST RECOLLECTION (A)  Final   Report Status 12/02/2021 FINAL  Final     Labs: BNP (last 3 results) Recent Labs    12/05/21 1157 12/06/21 0527 12/07/21 0521  BNP 579.0* 415.0* 229.7*   Basic Metabolic Panel: Recent Labs  Lab 12/02/21 0251 12/03/21 0253 12/05/21 1154 12/06/21 0527 12/07/21 0521  NA 137 137 140 139 139  K 3.0* 4.1 3.2* 4.1 3.5  CL 108 110 109 107 105  CO2 20* _0 GLUCOSE 99 97 189* 112* 121*  BUN _1 CREATININE 0.98 0.88 0.82 0.76 0.85  CALCIUM 8.2* 8.3* 8.8* 8.5* 8.8*  MG  --  2.2 1.9 1.9 2.0   Liver Function Tests: No results for input(s): "AST", "ALT", "ALKPHOS", "BILITOT", "PROT", "ALBUMIN" in the last 168 hours. No results for input(s): "LIPASE", "AMYLASE" in the last 168 hours. No results for input(s): "AMMONIA" in the last 168 hours. CBC: Recent Labs  Lab 12/01/21 0047 12/02/21 0251 12/03/21 0253 12/05/21 1154 12/06/21 0527  WBC 8.4 10.9* 7.5 8.9 7.8  NEUTROABS  --   --   --  6.1  --   HGB 8.1* 8.0* 7.9* 8.7* 8.2*  HCT 25.2* 24.4* 24.8* 28.2* 26.1*  MCV 87.8 86.5 88.3 90.4 90.3  PLT 371 479* 496* 544* 520*   Cardiac Enzymes: No results  for input(s): "CKTOTAL", "CKMB", "CKMBINDEX", "TROPONINI" in the last 168 hours. BNP: Invalid input(s): "POCBNP" CBG: Recent Labs  Lab 12/06/21 0710  GLUCAP 118*   D-Dimer Recent Labs    12/05/21 1154  DDIMER 1.91*   Hgb A1c No results for input(s): "HGBA1C"  in the last 72 hours. Lipid Profile Recent Labs    12/06/21 0527  CHOL 123  HDL 31*  LDLCALC 67  TRIG 125  CHOLHDL 4.0   Thyroid function studies Recent Labs    12/05/21 1154  TSH 8.232*   Anemia work up No results for input(s): "VITAMINB12", "FOLATE", "FERRITIN", "TIBC", "IRON", "RETICCTPCT" in the last 72 hours. Urinalysis    Component Value Date/Time   COLORURINE YELLOW 10/18/2021 1124   APPEARANCEUR CLEAR 10/18/2021 1124   APPEARANCEUR Cloudy (A) 03/14/2021 1355   LABSPEC 1.009 10/18/2021 1124   PHURINE 6.0 10/18/2021 1124   GLUCOSEU NEGATIVE 10/18/2021 1124   HGBUR SMALL (A) 10/18/2021 1124   BILIRUBINUR NEGATIVE 10/18/2021 1124   BILIRUBINUR Negative 03/14/2021 1355   KETONESUR NEGATIVE 10/18/2021 1124   PROTEINUR NEGATIVE 10/18/2021 1124   UROBILINOGEN 0.2 09/20/2014 1653   NITRITE NEGATIVE 10/18/2021 1124   LEUKOCYTESUR NEGATIVE 10/18/2021 1124   Sepsis Labs Recent Labs  Lab 12/02/21 0251 12/03/21 0253 12/05/21 1154 12/06/21 0527  WBC 10.9* 7.5 8.9 7.8   Microbiology Recent Results (from the past 240 hour(s))  Urine Culture     Status: Abnormal   Collection Time: 11/30/21  8:41 PM   Specimen: Urine, Clean Catch  Result Value Ref Range Status   Specimen Description URINE, CLEAN CATCH  Final   Special Requests   Final    NONE Performed at [facility], [place] [address], [place], [place] [address]    Culture MULTIPLE SPECIES PRESENT, SUGGEST RECOLLECTION (A)  Final   Report Status 12/02/2021 FINAL  Final   Time coordinating discharge: 38 mins  SIGNED:  Irwin Brakeman, MD  [facility] 12/07/2021, 11:39 AM How to contact the [facility] Attending or Consulting provider [place] or covering provider during after hours [place], for this patient?  Check the care team in [facility] and look for a) attending/consulting [facility] provider listed and b) the [facility] team listed Log into [email or web address] and use [place]'s universal password to access. If you do not have the password, please contact the hospital operator. Locate the [facility] provider you are looking for under [facility] and page to a number that you can be directly reached. If you still have difficulty reaching the provider, please page the [facility] (Director on Call) for the Hospitalists listed on amion for assistance.

## 2021-12-07 NOTE — Progress Notes (Signed)
BP  94/64  pulse 87.  Dr. Laural Benes ordered to hold zebeta and Imdur.

## 2021-12-07 NOTE — Assessment & Plan Note (Signed)
--  pt currently has indwelling foley cath in place, with outpatient follow up with urologist

## 2021-12-07 NOTE — Progress Notes (Signed)
IV removed and discharge instructions reviewed.  Said pharmacy was not right so sent message to Dr. Laural Benes to sent meds to [facility],.   Leg bag attached to foley for discharge.  Follow up with cardiology scheduled for next week and to discuss bp meds with them.  Transported by [facility] to main entrance with friend to drive home

## 2021-12-07 NOTE — Discharge Instructions (Signed)
IMPORTANT INFORMATION: PAY CLOSE ATTENTION   PHYSICIAN DISCHARGE INSTRUCTIONS  Follow with Primary care provider  Daniel, [name] G, MD  and other consultants as instructed by your Hospitalist Physician  SEEK MEDICAL CARE OR RETURN TO EMERGENCY ROOM IF SYMPTOMS COME BACK, WORSEN OR NEW PROBLEM DEVELOPS   Please note: You were cared for by a hospitalist during your hospital stay. Every effort will be made to forward records to your primary care provider.  You can request that your primary care provider send for your hospital records if they have not received them.  Once you are discharged, your primary care physician will handle any further medical issues. Please note that NO REFILLS for any discharge medications will be authorized once you are discharged, as it is imperative that you return to your primary care physician (or establish a relationship with a primary care physician if you do not have one) for your post hospital discharge needs so that they can reassess your need for medications and monitor your lab values.  Please get a complete blood count and chemistry panel checked by your Primary MD at your next visit, and again as instructed by your Primary MD.  Get Medicines reviewed and adjusted: Please take all your medications with you for your next visit with your Primary MD  Laboratory/radiological data: Please request your Primary MD to go over all hospital tests and procedure/radiological results at the follow up, please ask your primary care provider to get all Hospital records sent to his/her office.  In some cases, they will be blood work, cultures and biopsy results pending at the time of your discharge. Please request that your primary care provider follow up on these results.  If you are diabetic, please bring your blood sugar readings with you to your follow up appointment with primary care.    Please call and make your follow up appointments as soon as possible.    Also Note  the following: If you experience worsening of your admission symptoms, develop shortness of breath, life threatening emergency, suicidal or homicidal thoughts you must seek medical attention immediately by calling 911 or calling your MD immediately  if symptoms less severe.  You must read complete instructions/literature along with all the possible adverse reactions/side effects for all the Medicines you take and that have been prescribed to you. Take any new Medicines after you have completely understood and accpet all the possible adverse reactions/side effects.   Do not drive when taking Pain medications or sleeping medications (Benzodiazepines)  Do not take more than prescribed Pain, Sleep and Anxiety Medications. It is not advisable to combine anxiety,sleep and pain medications without talking with your primary care practitioner  Special Instructions: If you have smoked or chewed Tobacco  in the last 2 yrs please stop smoking, stop any regular Alcohol  and or any Recreational drug use.  Wear Seat belts while driving.  Do not drive if taking any narcotic, mind altering or controlled substances or recreational drugs or alcohol.       

## 2021-12-10 ENCOUNTER — Ambulatory Visit: Payer: Medicare HMO | Admitting: Urology

## 2021-12-10 VITALS — BP 106/68 | HR 82

## 2021-12-10 DIAGNOSIS — R339 Retention of urine, unspecified: Secondary | ICD-10-CM | POA: Diagnosis not present

## 2021-12-10 NOTE — Progress Notes (Signed)
Fill and Pull Catheter Removal  Patient is present today for a catheter removal.  Patient was cleaned and prepped in a sterile fashion of sterile water/ saline was instilled into the bladder when the patient felt the urge to urinate. 85ml of water was then drained from the balloon.  A 14FR foley cath was removed from the bladder no complications were noted .  Patient as then given some time to void on their own.  Patient can void  51ml on their own after some time.  Patient tolerated well.  Performed by: Guss Bunde, CMA  Follow up/ Additional notes: Follow up as scheduled.

## 2021-12-10 NOTE — Progress Notes (Signed)
12/10/2021 2:31 PM   Debbie [name] [birth year] [id number]  Referring provider: [name], PA [address] [place],  [place] [address]  No chief complaint on file.   HPI: She had an MI and during the hospitalization she developed urinary retention. She was diagnosed with emphysematous cystitis. She completed 10 day course of keflex. She denies any pelvic pain. 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 => NSTEMI 11/23/2021   NSTEMI (non-ST elevated myocardial infarction) (HCC) 11/22/2021   Osteoporosis    Renal insufficiency    Vaginal vault prolapse     Surgical History: Past Surgical History:  Procedure Laterality Date   ANTERIOR AND POSTERIOR REPAIR N/A 04/06/2013   Procedure: CYSTOCELE REPAIR ;  Surgeon: Martina Sinner, MD;  Location: [facility] ORS;  Service: Urology;  Laterality: N/A;   CATARACTS     REMOVED   COLONOSCOPY     CORNEA LACERATION REPAIR     CORONARY ARTERY BYPASS GRAFT     X 5 VESSELS   CORONARY STENT INTERVENTION N/A 11/23/2021   Procedure: CORONARY STENT INTERVENTION;  Surgeon: Tonny Bollman, MD;  Location: [facility] INVASIVE CV LAB;  Service: Cardiovascular;  Laterality: N/A;   CORONARY/GRAFT ACUTE MI REVASCULARIZATION N/A 11/24/2021   Procedure: Coronary/Graft Acute MI Revascularization;   Surgeon: Runell Gess, MD;  Location: [facility] INVASIVE CV LAB;  Service: Cardiovascular;  Laterality: N/A;   CYSTOSCOPY N/A 04/06/2013   Procedure: CYSTOSCOPY;  Surgeon: Martina Sinner, MD;  Location: [facility];  Service: Urology;  Laterality: N/A;   HERNIA REPAIR     LEFT HEART CATH AND CORONARY ANGIOGRAPHY N/A 11/24/2021   Procedure: LEFT HEART CATH AND CORONARY ANGIOGRAPHY;  Surgeon: Runell Gess, MD;  Location: [facility] INVASIVE CV LAB;  Service: Cardiovascular;  Laterality: N/A;   LEFT HEART CATH AND CORS/GRAFTS ANGIOGRAPHY N/A 11/23/2021   Procedure: LEFT HEART CATH AND CORS/GRAFTS ANGIOGRAPHY;  Surgeon: Tonny Bollman, MD;  Location: [facility] INVASIVE CV LAB;  Service: Cardiovascular;  Laterality: N/A;   SEPTOPLASTY N/A 04/28/2015   Procedure: SEPTOPLASTY;  Surgeon: Melvenia Beam, MD;  Location: [facility] OR;  Service: ENT;  Laterality: N/A;   SINUS ENDO W/FUSION Bilateral 04/28/2015   Procedure: ENDOSCOPIC SINUS SURGERY WITH NAVIGATION;  Surgeon: Melvenia Beam, MD;  Location: [facility] OR;  Service: ENT;  Laterality: Bilateral;   TUBAL LIGATION      Home Medications:  Allergies as of 12/10/2021       Reactions   Ivp Dye [iodinated Contrast Media] Swelling   Kidney Dye   Betadine [povidone Iodine] Rash   Codeine Nausea And Vomiting   Other Nausea And Vomiting   Narcotics - nausea,vomiting   Penicillins Rash   Did it involve swelling of the  face/tongue/throat, SOB, or low BP? No Did it involve sudden or severe rash/hives, skin peeling, or any reaction on the inside of your mouth or nose? No Did you need to seek medical attention at a hospital or doctor's office? No When did it last happen?       If all above answers are "NO", may proceed with cephalosporin use.   Sulfa Antibiotics Rash        Medication List        Accurate as of December 10, 2021  2:31 PM. If you have any questions, ask your nurse or doctor.          acetaminophen 650 MG CR tablet Commonly known as: TYLENOL Take 650 mg by  mouth in the morning.   atorvastatin 80 MG tablet Commonly known as: LIPITOR Take 1 tablet (80 mg total) by mouth at bedtime.   bisoprolol 5 MG tablet Commonly known as: ZEBETA Take 0.5 tablets (2.5 mg total) by mouth daily.   cephALEXin 500 MG capsule Commonly known as: KEFLEX Take 1 capsule (500 mg total) by mouth every 12 (twelve) hours for 6 days.   clopidogrel 75 MG tablet Commonly known as: PLAVIX Take 1 tablet (75 mg total) by mouth daily.   Eliquis 2.5 MG Tabs tablet Generic drug: apixaban Take 1 tablet (2.5 mg total) by mouth 2 (two) times daily.   ezetimibe 10 MG tablet Commonly known as: ZETIA Take 1 tablet (10 mg total) by mouth daily.   famotidine 20 MG tablet Commonly known as: PEPCID Take 40 mg by mouth at bedtime.   furosemide 40 MG tablet Commonly known as: LASIX Take 1 tablet (40 mg total) by mouth daily.   isosorbide mononitrate 30 MG 24 hr tablet Commonly known as: IMDUR Take 1 tablet (30 mg total) by mouth daily.   levocetirizine 5 MG tablet Commonly known as: XYZAL Take 5 mg by mouth every evening. Equate levocetirizine.   levothyroxine 88 MCG tablet Commonly known as: SYNTHROID Take 88 mcg by mouth daily.   nitroGLYCERIN 0.4 MG SL tablet Commonly known as: NITROSTAT Place 1 tablet (0.4 mg total) under the tongue every 5 (five) minutes x 3 doses as needed for chest pain (if no relief after 3rd dose, call 911 or proceed to the ED for an evaluation).   ondansetron 4 MG tablet Commonly known as: ZOFRAN Take 1 tablet (4 mg total) by mouth every 6 (six) hours as needed for nausea.   pantoprazole 40 MG tablet Commonly known as: PROTONIX Take 40 mg by mouth daily.   potassium chloride 10 MEQ tablet Commonly known as: KLOR-CON M Take 1 tablet (10 mEq total) by mouth daily.   saccharomyces boulardii 250 MG capsule Commonly known as: FLORASTOR Take 1 capsule (250 mg total) by mouth 2 (two) times daily.   tamsulosin 0.4 MG Caps  capsule Commonly known as: FLOMAX Take 0.4 mg by mouth.        Allergies:  Allergies  Allergen Reactions   Ivp Dye [Iodinated Contrast Media] Swelling    Kidney Dye   Betadine [Povidone Iodine] Rash   Codeine Nausea And Vomiting   Other Nausea And Vomiting    Narcotics - nausea,vomiting   Penicillins Rash    Did it involve swelling of the face/tongue/throat, SOB, or low BP? No Did it involve sudden or severe rash/hives, skin peeling, or any reaction on the inside of your mouth or nose? No Did you need to seek medical attention at a hospital or doctor's  office? No When did it last happen?       If all above answers are "NO", may proceed with cephalosporin use.    Sulfa Antibiotics Rash    Family History: Family History  Problem Relation Age of Onset   Cirrhosis Father    Lung disease Father    Diabetes Mellitus II Mother    Heart Problems Mother     Social History:  reports that she has never smoked. She has never used smokeless tobacco. She reports current alcohol use. She reports that she does not use drugs.  ROS: All other review of systems were reviewed and are negative except what is noted above in HPI  Physical Exam: BP 106/68   Pulse 82   Constitutional:  Alert and oriented, No acute distress. HEENT: [place] AT, moist mucus membranes.  Trachea midline, no masses. Cardiovascular: No clubbing, cyanosis, or edema. Respiratory: Normal respiratory effort, no increased work of breathing. GI: Abdomen is soft, nontender, nondistended, no abdominal masses GU: No CVA tenderness.  Lymph: No cervical or inguinal lymphadenopathy. Skin: No rashes, bruises or suspicious lesions. Neurologic: Grossly intact, no focal deficits, moving all 4 extremities. Psychiatric: Normal mood and affect.  Laboratory Data: Lab Results  Component Value Date   WBC 7.8 12/06/2021   HGB 8.2 (L) 12/06/2021   HCT 26.1 (L) 12/06/2021   MCV 90.3 12/06/2021   PLT 520 (H) 12/06/2021    Lab  Results  Component Value Date   CREATININE 0.85 12/07/2021    No results found for: "PSA"  No results found for: "TESTOSTERONE"  Lab Results  Component Value Date   HGBA1C 6.4 (H) 12/01/2021    Urinalysis    Component Value Date/Time   COLORURINE YELLOW 10/18/2021 1124   APPEARANCEUR CLEAR 10/18/2021 1124   APPEARANCEUR Cloudy (A) 03/14/2021 1355   LABSPEC 1.009 10/18/2021 1124   PHURINE 6.0 10/18/2021 1124   GLUCOSEU NEGATIVE 10/18/2021 1124   HGBUR SMALL (A) 10/18/2021 1124   BILIRUBINUR NEGATIVE 10/18/2021 1124   BILIRUBINUR Negative 03/14/2021 1355   KETONESUR NEGATIVE 10/18/2021 1124   PROTEINUR NEGATIVE 10/18/2021 1124   UROBILINOGEN 0.2 09/20/2014 1653   NITRITE NEGATIVE 10/18/2021 1124   LEUKOCYTESUR NEGATIVE 10/18/2021 1124    Lab Results  Component Value Date   LABMICR See below: 03/14/2021   WBCUA >30 (A) 03/14/2021   LABEPIT >10 (A) 03/14/2021   BACTERIA NONE SEEN 10/18/2021    Pertinent Imaging:  No results found for this or any previous visit.  No results found for this or any previous visit.  No results found for this or any previous visit.  No results found for this or any previous visit.  No results found for this or any previous visit.  No results found for this or any previous visit.  No results found for this or any previous visit.  No results found for this or any previous visit.   Assessment & Plan:    1. Urinary retention -voiding trial passed today -RTC 4-6 weeks with PVR   No follow-ups on file.  Wilkie Aye, MD  [facility] [place]

## 2021-12-11 NOTE — Progress Notes (Signed)
Cardiology Office Note    Date:  12/12/2021   ID:  Debbie [name], DOB November 19, 1940, MRN [id number]  PCP:  Debbie Bis, MD  Cardiologist: Rozann Lesches, MD    Chief Complaint  Patient presents with   Hospitalization Follow-up    History of Present Illness:    Debbie [name] is a 81 y.o. [name] with past medical history of CAD (s/p CABG in 2006), HTN, HLD and COPD who presents to the office today for hospital follow-up.   She was initially admitted to [facility] from 7/6 - 12/03/2021 for an NSTEMI and initial cath showed patent LIMA to the LAD and occlusion of her saphenous vein grafts. She did undergo successful PCI of the native left main and native LCx with DES placement. She developed significant ST elevation on repeat EKG later in admission and repeat cath was performed and showed a widely patent stent into her left main and LCx which was previously placed but did have high-grade proximal SVG to D1 stenosis likely representing the "culprit lesion" and a DES was placed. Follow-up echo showed a cardiomyopathy with EF at 25-30% and LV thrombus. Was started on Eliquis and Plavix and ASA was discontinued during admission secondary to hematuria. Admission was also complicated by cystitis requiring foley catheter placement.   She had a recurrent admission at [facility] from 7/19 - 12/07/2021 for generalized weakness and Cardiology was consulted as she was felt to have a mild CHF exacerbation during admission. She responded well to IV Lasix and this was transitioned to PO Lasix 40mg  daily at discharge and her weight had improved to 131 lbs. She was evaluated by PT with Outpatient PT recommended.   In talking with the patient today, she reports still having weakness since her hospital discharge but is trying to gradually increase activity around her home. She is scheduled to start PT in the coming weeks. She still has dyspnea with minimal activity but is now up to walking for 10 to 15  minutes at a time and says this is gradually improving. No recent chest pain or palpitations. No specific orthopnea, PND or pitting edema.  Says that her weight has been stable between 121 - 122 lbs on her home scales.   Past Medical History:  Diagnosis Date   Allergic rhinitis    Anxiety    Arthritis    Asthma    Bladder spasms    Carotid artery disease (HCC)    Collagen vascular disease (HCC)    COPD (chronic obstructive pulmonary disease) ([place])    Cystocele    Essential hypertension    GERD (gastroesophageal reflux disease)    Hyperlipidemia    Hypothyroidism    Ischemic cardiomyopathy 12/02/2021   Echo 11/27/2021:     Multiple vessel coronary artery disease => CABG (LIMA-dLAD, SVG-Diag, Seq SVG-OM-OM-PDA)    a. s/p CABG in 2006 b. NSTEMI in 11/2021 with DES to LM and LCx and repeat cath later in admission with DES to SVG-D1   NSTEMI (non-ST elevated myocardial infarction) ([place]) 11/22/2021   Osteoporosis    Renal insufficiency    Vaginal vault prolapse     Past Surgical History:  Procedure Laterality Date   ANTERIOR AND POSTERIOR REPAIR N/A 04/06/2013   Procedure: CYSTOCELE REPAIR ;  Surgeon: Reece Packer, MD;  Location: [facility] ORS;  Service: Urology;  Laterality: N/A;   CATARACTS     REMOVED   COLONOSCOPY     CORNEA LACERATION REPAIR  CORONARY ARTERY BYPASS GRAFT     X 5 VESSELS   CORONARY STENT INTERVENTION N/A 11/23/2021   Procedure: CORONARY STENT INTERVENTION;  Surgeon: Tonny Bollman, MD;  Location: [facility] INVASIVE CV LAB;  Service: Cardiovascular;  Laterality: N/A;   CORONARY/GRAFT ACUTE MI REVASCULARIZATION N/A 11/24/2021   Procedure: Coronary/Graft Acute MI Revascularization;  Surgeon: Runell Gess, MD;  Location: [facility] INVASIVE CV LAB;  Service: Cardiovascular;  Laterality: N/A;   CYSTOSCOPY N/A 04/06/2013   Procedure: CYSTOSCOPY;  Surgeon: Martina Sinner, MD;  Location: [facility] ORS;  Service: Urology;  Laterality: N/A;   HERNIA REPAIR     LEFT HEART CATH AND  CORONARY ANGIOGRAPHY N/A 11/24/2021   Procedure: LEFT HEART CATH AND CORONARY ANGIOGRAPHY;  Surgeon: Runell Gess, MD;  Location: [facility] INVASIVE CV LAB;  Service: Cardiovascular;  Laterality: N/A;   LEFT HEART CATH AND CORS/GRAFTS ANGIOGRAPHY N/A 11/23/2021   Procedure: LEFT HEART CATH AND CORS/GRAFTS ANGIOGRAPHY;  Surgeon: Tonny Bollman, MD;  Location: [facility] INVASIVE CV LAB;  Service: Cardiovascular;  Laterality: N/A;   SEPTOPLASTY N/A 04/28/2015   Procedure: SEPTOPLASTY;  Surgeon: Melvenia Beam, MD;  Location: [facility] OR;  Service: ENT;  Laterality: N/A;   SINUS ENDO W/FUSION Bilateral 04/28/2015   Procedure: ENDOSCOPIC SINUS SURGERY WITH NAVIGATION;  Surgeon: Melvenia Beam, MD;  Location: [facility] OR;  Service: ENT;  Laterality: Bilateral;   TUBAL LIGATION      Current Medications: Outpatient Medications Prior to Visit  Medication Sig Dispense Refill   acetaminophen (TYLENOL) 650 MG CR tablet Take 650 mg by mouth in the morning.     apixaban (ELIQUIS) 2.5 MG TABS tablet Take 1 tablet (2.5 mg total) by mouth 2 (two) times daily. 60 tablet 11   atorvastatin (LIPITOR) 80 MG tablet Take 1 tablet (80 mg total) by mouth at bedtime. 30 tablet 3   bisoprolol (ZEBETA) 5 MG tablet Take 0.5 tablets (2.5 mg total) by mouth daily. 15 tablet 1   cephALEXin (KEFLEX) 500 MG capsule Take 1 capsule (500 mg total) by mouth every 12 (twelve) hours for 6 days. 12 capsule 0   clopidogrel (PLAVIX) 75 MG tablet Take 1 tablet (75 mg total) by mouth daily. 90 tablet 3   ezetimibe (ZETIA) 10 MG tablet Take 1 tablet (10 mg total) by mouth daily. 30 tablet 11   famotidine (PEPCID) 20 MG tablet Take 40 mg by mouth at bedtime.     furosemide (LASIX) 40 MG tablet Take 1 tablet (40 mg total) by mouth daily. 30 tablet 1   isosorbide mononitrate (IMDUR) 30 MG 24 hr tablet Take 1 tablet (30 mg total) by mouth daily. 90 tablet 2   levocetirizine (XYZAL) 5 MG tablet Take 5 mg by mouth every evening. Equate levocetirizine.     levothyroxine  (SYNTHROID) 88 MCG tablet Take 88 mcg by mouth daily.     nitroGLYCERIN (NITROSTAT) 0.4 MG SL tablet Place 1 tablet (0.4 mg total) under the tongue every 5 (five) minutes x 3 doses as needed for chest pain (if no relief after 3rd dose, call 911 or proceed to the ED for an evaluation). 25 tablet 2   ondansetron (ZOFRAN) 4 MG tablet Take 1 tablet (4 mg total) by mouth every 6 (six) hours as needed for nausea. 20 tablet 0   pantoprazole (PROTONIX) 40 MG tablet Take 40 mg by mouth daily.     potassium chloride (KLOR-CON M) 10 MEQ tablet Take 1 tablet (10 mEq total) by mouth daily. 120 tablet 3  saccharomyces boulardii (FLORASTOR) 250 MG capsule Take 1 capsule (250 mg total) by mouth 2 (two) times daily. 60 capsule 0   tamsulosin (FLOMAX) 0.4 MG CAPS capsule Take 0.4 mg by mouth.     No facility-administered medications prior to visit.     Allergies:   Ivp dye [iodinated contrast media], Betadine [povidone iodine], Codeine, Other, Penicillins, and Sulfa antibiotics   Social History   Socioeconomic History   Marital status: Legally Separated    Spouse name: Not on file   Number of children: 5   Years of education: Not on file   Highest education level: Not on file  Occupational History   Occupation: Retired  Tobacco Use   Smoking status: Never   Smokeless tobacco: Never  Vaping Use   Vaping Use: Never used  Substance and Sexual Activity   Alcohol use: Yes    Alcohol/week: 0.0 standard drinks of alcohol    Comment: 1 drink 1 time a year   Drug use: No   Sexual activity: Not on file  Other Topics Concern   Not on file  Social History Narrative   Not on file   Social Determinants of Health   Financial Resource Strain: Unknown (12/31/2018)   Overall Financial Resource Strain (CARDIA)    Difficulty of Paying Living Expenses: Patient refused  Food Insecurity: Unknown (12/31/2018)   Hunger Vital Sign    Worried About Running Out of Food in the Last Year: Patient refused    [place] in the Last Year: Patient refused  Transportation Needs: Unknown (12/31/2018)   PRAPARE - Transportation    Lack of Transportation (Medical): Patient refused    Lack of Transportation (Non-Medical): Patient refused  Physical Activity: Unknown (12/31/2018)   Exercise Vital Sign    Days of Exercise per Week: Patient refused    Minutes of Exercise per Session: Patient refused  Stress: Unknown (12/31/2018)   [place]    Feeling of Stress : Patient refused  Social Connections: Unknown (12/31/2018)   Social Connection and Isolation Panel [NHANES]    Frequency of Communication with Friends and Family: Patient refused    Frequency of Social Gatherings with Friends and Family: Patient refused    Attends Religious Services: Patient refused    Marine scientist or Organizations: Patient refused    Attends Music therapist: Patient refused    Marital Status: Patient refused     Family History:  The patient's family history includes Cirrhosis in her father; Diabetes Mellitus II in her mother; Heart Problems in her mother; Lung disease in her father.   Review of Systems:    Please see the history of present illness.     All other systems reviewed and are otherwise negative except as noted above.   Physical Exam:    VS:  BP 112/60   Pulse 69   Ht 5' 2.5" (1.588 m)   Wt 128 lb 9.6 oz (58.3 kg)   SpO2 97%   BMI 23.15 kg/m    General: Pleasant elderly [name] appearing in no acute distress. Head: Normocephalic, atraumatic. Neck: No carotid bruits. JVD not elevated.  Lungs: Respirations regular and unlabored, without wheezes or rales.  Heart: Regular rate and rhythm. No S3 or S4.  No murmur, no rubs, or gallops appreciated. Abdomen: Appears non-distended. No obvious abdominal masses. Msk:  Strength and tone appear normal for age. No obvious joint deformities or effusions. Extremities: No clubbing  or  cyanosis. Trace ankle edema bilaterally.  Distal pedal pulses are 2+ bilaterally. Neuro: Alert and oriented X 3. Moves all extremities spontaneously. No focal deficits noted. Psych:  Responds to questions appropriately with a normal affect. Skin: No rashes or lesions noted  Wt Readings from Last 3 Encounters:  12/12/21 128 lb 9.6 oz (58.3 kg)  12/07/21 131 lb 9.8 oz (59.7 kg)  12/01/21 138 lb 7.2 oz (62.8 kg)     Studies/Labs Reviewed:   EKG:  EKG is not ordered today.   Recent Labs: 11/22/2021: ALT 16 12/05/2021: TSH 8.232 12/06/2021: Hemoglobin 8.2; Platelets 520 12/07/2021: B Natriuretic Peptide 212.0; BUN 9; Creatinine, Ser 0.85; Magnesium 2.0; Potassium 3.5; Sodium 139   Lipid Panel    Component Value Date/Time   CHOL 123 12/06/2021 0527   TRIG 125 12/06/2021 0527   HDL 31 (L) 12/06/2021 0527   CHOLHDL 4.0 12/06/2021 0527   VLDL 25 12/06/2021 0527   LDLCALC 67 12/06/2021 0527    Additional studies/ records that were reviewed today include:   LHC: 11/2021 1.  Severe native vessel coronary artery disease with severe ostial left main stenosis, total occlusion of the LAD, severe stenosis in the distal left main/circumflex junction, severe diffuse stenosis of the distal AV circumflex, and a diffusely diseased nondominant RCA 2.  Status post aortocoronary bypass surgery with continued patency of the LIMA to LAD and occlusion of the saphenous vein grafts 3.  Successful PCI of the native left main and native circumflex using a 4.0 x 24 mm Synergy DES  Recommendation: Dual antiplatelet therapy with aspirin and ticagrelor at least 12 months, aggressive risk reduction measures, suspect the patient occluded a vein graft causing her acute coronary syndrome.  There were no vein graft targets for PCI.  Her native left main into the circumflex is treated as this large territory is now potentially ischemic after occlusion of the sequential saphenous vein graft to OM and PLA.  Repeat LHC:  11/2021   Dist LM to Prox LAD lesion is 100% stenosed.   Ost LM to Dist LM lesion is 25% stenosed.   Dist Cx lesion is 50% stenosed.   Origin to Prox Graft lesion is 99% stenosed.   Previously placed Ost Cx to Prox Cx stent of unknown type is  widely patent.   A drug-eluting stent was successfully placed using a STENT ONYX FRONTIER 3.0X18.   Post intervention, there is a 0% residual stenosis.   IMPRESSION: Widely patent left main into the circumflex stent performed yesterday by Dr. Excell Seltzer.  Atretic appearing LIMA to a small distal LAD.  Patent sequential vein to an OM and PDA with high-grade proximal SVG to diagonal vein graft stenosis probably representing the "culprit lesion with an excellent angiographic result after PCI drug-eluting stenting.  Hopefully her anterior wall stunned and will come back.  The sheath will be removed once ACT falls below 170 pressure held.  I am going to restart heparin 4 hours after sheath removal for 24 hours.  Given her elevated LVEDP, she probably should be quire some diuresis as well.  She left the lab in stable condition.  Limited Echo: 11/2021 IMPRESSIONS     1. Follow up limited study with [facility] shows persistent apical left  ventricular thrombus. The clot now appears fixed, although with a  relatively narrow attachment. Width 7 mm, length 13 mm.. Left ventricular  ejection fraction, by estimation, is 25 to  30%. The left ventricle has severely decreased function.   Assessment:  1. HFrEF (heart failure with reduced ejection fraction) (HCC)   2. Coronary artery disease involving native coronary artery of native heart without angina pectoris   3. LV (left ventricular) mural thrombus   4. Medication management   5. Essential hypertension   6. Hyperlipidemia LDL goal <70   7. Anemia, unspecified type      Plan:   In order of problems listed above:  1. HFrEF - Her EF was reduced at 25-30% by echocardiogram on 11/27/2021. Weight has been stable  on her home scales and she does not appear volume overloaded by examination today. - Remains on Bisoprolol 2.5mg  daily, Lasix 40mg  daily and Imdur 30mg  daily. Will recheck a BMET. BP has not allowed for ACE-I/ARB/ARNI/Spiro thus far and she is no longer on an SLGT2 inhibitor as it was felt by Urology this could have caused her cystitis during recent admission. Will plan for a repeat limited echocardiogram in 3 months for reassessment of her EF.    2. CAD - She is s/p CABG in 2006 and recently underwent PCI/DES of the native left main and native LCx with subsequent DES placement to the SVG-D1 earlier this month.  - She has baseline dyspnea on exertion but this is improving as she becomes more active. She denies any recurrent chest pain. - Continue current medical therapy with Plavix, Imdur, Atorvastatin and Bisoprolol.   3. LV Thrombus - Noted on most recent echocardiogram and she was started on Eliquis 2.5 mg BID for this given the concurrent use of Plavix and due to her hematuria. Will recheck a limited echocardiogram in 3 months.   4. HTN - BP is at 112/60 during today's visit but SBP has been in the low 100's when checked at home. Continue current medical therapy with Bisoprolol 2.5 mg daily, Lasix 40 mg daily and Imdur 30 mg daily.   5. HLD - LDL had improved to 67 when rechecked on 12/06/2021. Continue current medical therapy with Atorvastatin 80mg  daily and Zetia 10mg  daily.   6. Anemia - Hgb had improved to 8.2 when checked on 12/06/2021. No recurrent hematuria. Will recheck a CBC in the next 1-2 weeks.     Medication Adjustments/Labs and Tests Ordered: Current medicines are reviewed at length with the patient today.  Concerns regarding medicines are outlined above.  Medication changes, Labs and Tests ordered today are listed in the Patient Instructions below. Patient Instructions  Limit daily fluid intake to less than 2 Liters per day. Please limit salt intake.  Please weight  yourself every morning. Take an extra Lasix tablet if weight increases by 2 pounds overnight or 5 pounds in a single week.   Medication Instructions:  Your physician recommends that you continue on your current medications as directed. Please refer to the Current Medication list given to you today.   Labwork: In 1-2 Weeks: CBC BMET  Testing/Procedures: Your physician has requested that you have an echocardiogram. Echocardiography is a painless test that uses sound waves to create images of your heart. It provides your doctor with information about the size and shape of your heart and how well your heart's chambers and valves are working. This procedure takes approximately one hour. There are no restrictions for this procedure.   Follow-Up: Follow up with Dr. 12/08/2021 or , PA-C in 3 months- AFTER ECHO  Any Other Special Instructions Will Be Listed Below (If Applicable).     If you need a refill on your cardiac medications before your next appointment, please  call your pharmacy.    Signed, Erma Heritage, PA-C  12/12/2021 5:17 PM    [facility] [address] [place], [place] [address] Phone: [phone] Fax: [phone]

## 2021-12-12 ENCOUNTER — Ambulatory Visit: Payer: Medicare HMO | Admitting: Student

## 2021-12-12 ENCOUNTER — Encounter: Payer: Self-pay | Admitting: Student

## 2021-12-12 VITALS — BP 112/60 | HR 69 | Ht 62.5 in | Wt 128.6 lb

## 2021-12-12 DIAGNOSIS — E785 Hyperlipidemia, unspecified: Secondary | ICD-10-CM

## 2021-12-12 DIAGNOSIS — Z79899 Other long term (current) drug therapy: Secondary | ICD-10-CM | POA: Diagnosis not present

## 2021-12-12 DIAGNOSIS — I513 Intracardiac thrombosis, not elsewhere classified: Secondary | ICD-10-CM | POA: Diagnosis not present

## 2021-12-12 DIAGNOSIS — I10 Essential (primary) hypertension: Secondary | ICD-10-CM | POA: Diagnosis not present

## 2021-12-12 DIAGNOSIS — D649 Anemia, unspecified: Secondary | ICD-10-CM | POA: Diagnosis not present

## 2021-12-12 DIAGNOSIS — I2510 Atherosclerotic heart disease of native coronary artery without angina pectoris: Secondary | ICD-10-CM | POA: Diagnosis not present

## 2021-12-12 DIAGNOSIS — I5020 Unspecified systolic (congestive) heart failure: Secondary | ICD-10-CM | POA: Diagnosis not present

## 2021-12-12 NOTE — Patient Instructions (Addendum)
Limit daily fluid intake to less than 2 Liters per day. Please limit salt intake.  Please weight yourself every morning. Take an extra Lasix tablet if weight increases by 2 pounds overnight or 5 pounds in a single week.   Medication Instructions:  Your physician recommends that you continue on your current medications as directed. Please refer to the Current Medication list given to you today.   Labwork: In 1-2 Weeks: CBC BMET  Testing/Procedures: Your physician has requested that you have an echocardiogram. Echocardiography is a painless test that uses sound waves to create images of your heart. It provides your doctor with information about the size and shape of your heart and how well your heart's chambers and valves are working. This procedure takes approximately one hour. There are no restrictions for this procedure.   Follow-Up: Follow up with Dr. [name] or Randall An, PA-C in 3 months- AFTER ECHO  Any Other Special Instructions Will Be Listed Below (If Applicable).     If you need a refill on your cardiac medications before your next appointment, please call your pharmacy.

## 2021-12-13 ENCOUNTER — Other Ambulatory Visit (HOSPITAL_COMMUNITY): Payer: Self-pay

## 2021-12-13 ENCOUNTER — Telehealth (HOSPITAL_BASED_OUTPATIENT_CLINIC_OR_DEPARTMENT_OTHER): Payer: Self-pay

## 2021-12-13 NOTE — Telephone Encounter (Signed)
Pharmacy Transitions of Care Follow-up Telephone Call  Date of discharge: 12/03/21 and 12/07/21  Discharge Diagnosis: NSTEMI  How have you been since you were released from the hospital? Patient has been doing well since leaving the hospital twice, was counseled on her medications and she has no questions. Medications have been transferred to [facility] in [place].   Medication changes made at discharge: START taking these medications  START taking these medications  clopidogrel 75 MG tablet Commonly known as: PLAVIX Take 1 tablet (75 mg total) by mouth daily.  Eliquis 2.5 MG Tabs tablet Generic drug: apixaban Take 1 tablet (2.5 mg total) by mouth 2 (two) times daily.  ezetimibe 10 MG tablet Commonly known as: ZETIA Take 1 tablet (10 mg total) by mouth daily.   CONTINUE taking these medications  CONTINUE taking these medications  acetaminophen 650 MG CR tablet Commonly known as: TYLENOL  atorvastatin 80 MG tablet Commonly known as: LIPITOR Take 1 tablet (80 mg total) by mouth at bedtime.  famotidine 20 MG tablet Commonly known as: PEPCID  levocetirizine 5 MG tablet Commonly known as: XYZAL  levothyroxine 88 MCG tablet Commonly known as: SYNTHROID  nitroGLYCERIN 0.4 MG SL tablet Commonly known as: NITROSTAT Place 1 tablet (0.4 mg total) under the tongue every 5 (five) minutes x 3 doses as needed for chest pain (if no relief after 3rd dose, call 911 or proceed to the ED for an evaluation).  ondansetron 4 MG tablet Commonly known as: ZOFRAN Take 1 tablet (4 mg total) by mouth every 6 (six) hours as needed for nausea.  pantoprazole 40 MG tablet Commonly known as: PROTONIX  tamsulosin 0.4 MG Caps capsule Commonly known as: FLOMAX   STOP taking these medications  STOP taking these medications  amLODipine 5 MG tablet Commonly known as: NORVASC  aspirin 81 MG tablet  bisoprolol 5 MG tablet Commonly known as: ZEBETA  furosemide 40 MG tablet Commonly known as: LASIX   furosemide 80 MG tablet Commonly known as: LASIX  lisinopril 20 MG tablet Commonly known as: ZESTRIL  potassium chloride 10 MEQ CR capsule Commonly known as: MICRO-K    Medication changes verified by the patient? yes    Medication Accessibility:  [facility]acy: [facility]   Was the patient provided with refills on discharged medications? yes   Have all prescriptions been transferred from [facility] to home pharmacy? yes   Is the patient able to afford medications? no Notable copays: Eliquis- $400  Eligible patient assistance: yes    Medication Review: APIXABAN (ELIQUIS) - PTA Apixaban 5mg .  - Discussed importance of taking medication around the same time everyday  - Reviewed potential DDIs with patient  - Advised patient of medications to avoid (NSAIDs, ASA)  - Educated that Tylenol (acetaminophen) will be the preferred analgesic to prevent risk of bleeding  - Emphasized importance of monitoring for signs and symptoms of bleeding (abnormal bruising, prolonged bleeding, nose bleeds, bleeding from gums, discolored urine, black tarry stools)  - Advised patient to alert all providers of anticoagulation therapy prior to starting a new medication or having a procedure   CLOPIDOGREL (PLAVIX) Clopidogrel 75 mg once daily.  - Reviewed potential DDIs with patient  - Advised patient of medications to avoid (NSAIDs, ASA)  - Educated that Tylenol (acetaminophen) will be the preferred analgesic to prevent risk of bleeding  - Emphasized importance of monitoring for signs and symptoms of bleeding (abnormal bruising, prolonged bleeding, nose bleeds, bleeding from gums, discolored urine, black tarry stools)  - Advised  patient to alert all providers of anticoagulation therapy prior to starting a new medication or having a procedure    Follow-up Appointments:  If their condition worsens, is the pt aware to call PCP or go to the Emergency Dept.? yes  Final Patient Assessment: Patient has  been doing well since leaving the hospital twice, was counseled on her medications and she has no questions. Medications have been transferred to [facility] in [place].   Jiles Crocker, PharmD Clinical Pharmacist Med [facility] 12/13/2021 11:26 AM

## 2021-12-17 ENCOUNTER — Encounter: Payer: Self-pay | Admitting: Urology

## 2021-12-17 DIAGNOSIS — I513 Intracardiac thrombosis, not elsewhere classified: Secondary | ICD-10-CM | POA: Diagnosis not present

## 2021-12-17 DIAGNOSIS — I5020 Unspecified systolic (congestive) heart failure: Secondary | ICD-10-CM | POA: Diagnosis not present

## 2021-12-17 DIAGNOSIS — J449 Chronic obstructive pulmonary disease, unspecified: Secondary | ICD-10-CM | POA: Diagnosis not present

## 2021-12-17 DIAGNOSIS — I213 ST elevation (STEMI) myocardial infarction of unspecified site: Secondary | ICD-10-CM | POA: Diagnosis not present

## 2021-12-17 DIAGNOSIS — D6869 Other thrombophilia: Secondary | ICD-10-CM | POA: Diagnosis not present

## 2021-12-17 NOTE — Patient Instructions (Signed)
Urinary Tract Infection, Adult A urinary tract infection (UTI) is an infection of any part of the urinary tract. The urinary tract includes: The kidneys. The ureters. The bladder. The urethra. These organs make, store, and get rid of pee (urine) in the body. What are the causes? This infection is caused by germs (bacteria) in your genital area. These germs grow and cause swelling (inflammation) of your urinary tract. What increases the risk? The following factors may make you more likely to develop this condition: Using a small, thin tube (catheter) to drain pee. Not being able to control when you pee or poop (incontinence). Being female. If you are female, these things can increase the risk: Using these methods to prevent pregnancy: A medicine that kills sperm (spermicide). A device that blocks sperm (diaphragm). Having low levels of a female hormone (estrogen). Being pregnant. You are more likely to develop this condition if: You have genes that add to your risk. You are sexually active. You take antibiotic medicines. You have trouble peeing because of: A prostate that is bigger than normal, if you are female. A blockage in the part of your body that drains pee from the bladder. A kidney stone. A nerve condition that affects your bladder. Not getting enough to drink. Not peeing often enough. You have other conditions, such as: Diabetes. A weak disease-fighting system (immune system). Sickle cell disease. Gout. Injury of the spine. What are the signs or symptoms? Symptoms of this condition include: Needing to pee right away. Peeing small amounts often. Pain or burning when peeing. Blood in the pee. Pee that smells bad or not like normal. Trouble peeing. Pee that is cloudy. Fluid coming from the vagina, if you are female. Pain in the belly or lower back. Other symptoms include: Vomiting. Not feeling hungry. Feeling mixed up (confused). This may be the first symptom in  older adults. Being tired and grouchy (irritable). A fever. Watery poop (diarrhea). How is this treated? Taking antibiotic medicine. Taking other medicines. Drinking enough water. In some cases, you may need to see a specialist. Follow these instructions at home:  Medicines Take over-the-counter and prescription medicines only as told by your doctor. If you were prescribed an antibiotic medicine, take it as told by your doctor. Do not stop taking it even if you start to feel better. General instructions Make sure you: Pee until your bladder is empty. Do not hold pee for a long time. Empty your bladder after sex. Wipe from front to back after peeing or pooping if you are a female. Use each tissue one time when you wipe. Drink enough fluid to keep your pee pale yellow. Keep all follow-up visits. Contact a doctor if: You do not get better after 1-2 days. Your symptoms go away and then come back. Get help right away if: You have very bad back pain. You have very bad pain in your lower belly. You have a fever. You have chills. You feeling like you will vomit or you vomit. Summary A urinary tract infection (UTI) is an infection of any part of the urinary tract. This condition is caused by germs in your genital area. There are many risk factors for a UTI. Treatment includes antibiotic medicines. Drink enough fluid to keep your pee pale yellow. This information is not intended to replace advice given to you by your health care provider. Make sure you discuss any questions you have with your health care provider. Document Revised: 12/17/2019 Document Reviewed: 12/17/2019 Elsevier Patient Education    2023 [employer]  

## 2021-12-19 ENCOUNTER — Other Ambulatory Visit: Payer: Medicare HMO

## 2021-12-25 DIAGNOSIS — N182 Chronic kidney disease, stage 2 (mild): Secondary | ICD-10-CM | POA: Diagnosis not present

## 2021-12-25 DIAGNOSIS — I10 Essential (primary) hypertension: Secondary | ICD-10-CM | POA: Diagnosis not present

## 2022-01-01 DIAGNOSIS — M19011 Primary osteoarthritis, right shoulder: Secondary | ICD-10-CM | POA: Diagnosis not present

## 2022-01-01 DIAGNOSIS — I10 Essential (primary) hypertension: Secondary | ICD-10-CM | POA: Diagnosis not present

## 2022-01-01 DIAGNOSIS — Z6823 Body mass index (BMI) 23.0-23.9, adult: Secondary | ICD-10-CM | POA: Diagnosis not present

## 2022-01-01 DIAGNOSIS — I25118 Atherosclerotic heart disease of native coronary artery with other forms of angina pectoris: Secondary | ICD-10-CM | POA: Diagnosis not present

## 2022-01-02 ENCOUNTER — Telehealth: Payer: Self-pay | Admitting: Cardiology

## 2022-01-02 DIAGNOSIS — J449 Chronic obstructive pulmonary disease, unspecified: Secondary | ICD-10-CM | POA: Diagnosis not present

## 2022-01-02 DIAGNOSIS — I5020 Unspecified systolic (congestive) heart failure: Secondary | ICD-10-CM | POA: Diagnosis not present

## 2022-01-02 DIAGNOSIS — D6869 Other thrombophilia: Secondary | ICD-10-CM | POA: Diagnosis not present

## 2022-01-02 DIAGNOSIS — I513 Intracardiac thrombosis, not elsewhere classified: Secondary | ICD-10-CM | POA: Diagnosis not present

## 2022-01-02 DIAGNOSIS — I213 ST elevation (STEMI) myocardial infarction of unspecified site: Secondary | ICD-10-CM | POA: Diagnosis not present

## 2022-01-02 NOTE — Telephone Encounter (Signed)
Patient made aware. Verbalized understanding. States that she was feeling very weak when she got up this morning and the therapist took her BP and got 95/46. States that she took her BP again around 2:30 this afternoon and got 174/58. States that she did not take her morning medications until 2:00 this afternoon because she was feeling so bad. States that she is feeling a lot better.

## 2022-01-02 NOTE — Telephone Encounter (Signed)
Pt c/o BP issue: STAT if pt c/o blurred vision, one-sided weakness or slurred speech  1. What are your last 5 BP readings? 95/46  2. Are you having any other symptoms (ex. Dizziness, headache, blurred vision, passed out)? Lethargic   3. What is your BP issue? Pt's physical therapist called to inform pt's bp and how she is overall not feeling well today. States she is tired and not feeling like herself. He says that he has already informed her PCP, but wanted to call her cardiologist, as well, just incase there should be med change.  Her vitals: 95/46 BP 121   Weight 72     HR  96     O2   Said to call pt if there are to be any changes.

## 2022-01-03 DIAGNOSIS — E1165 Type 2 diabetes mellitus with hyperglycemia: Secondary | ICD-10-CM | POA: Diagnosis not present

## 2022-01-03 DIAGNOSIS — K2100 Gastro-esophageal reflux disease with esophagitis, without bleeding: Secondary | ICD-10-CM | POA: Diagnosis not present

## 2022-01-03 DIAGNOSIS — N1831 Chronic kidney disease, stage 3a: Secondary | ICD-10-CM | POA: Diagnosis not present

## 2022-01-03 DIAGNOSIS — I10 Essential (primary) hypertension: Secondary | ICD-10-CM | POA: Diagnosis not present

## 2022-01-07 ENCOUNTER — Ambulatory Visit: Payer: Medicare HMO | Admitting: Physician Assistant

## 2022-01-08 ENCOUNTER — Ambulatory Visit (HOSPITAL_COMMUNITY): Payer: Medicare HMO | Attending: Family Medicine

## 2022-01-17 DIAGNOSIS — I10 Essential (primary) hypertension: Secondary | ICD-10-CM | POA: Diagnosis not present

## 2022-01-17 DIAGNOSIS — K2100 Gastro-esophageal reflux disease with esophagitis, without bleeding: Secondary | ICD-10-CM | POA: Diagnosis not present

## 2022-01-18 ENCOUNTER — Encounter: Payer: Self-pay | Admitting: *Deleted

## 2022-01-18 ENCOUNTER — Other Ambulatory Visit: Payer: Self-pay | Admitting: *Deleted

## 2022-01-18 DIAGNOSIS — M1711 Unilateral primary osteoarthritis, right knee: Secondary | ICD-10-CM | POA: Diagnosis not present

## 2022-01-18 DIAGNOSIS — Z6823 Body mass index (BMI) 23.0-23.9, adult: Secondary | ICD-10-CM | POA: Diagnosis not present

## 2022-01-18 DIAGNOSIS — I5020 Unspecified systolic (congestive) heart failure: Secondary | ICD-10-CM | POA: Diagnosis not present

## 2022-01-18 NOTE — Patient Outreach (Signed)
  Care Coordination   Initial Visit Note   01/18/2022 Name: Debbie [name] MRN: [id number] DOB: 1940/08/16  Debbie [name] is a 81 y.o. year old female who sees Richardean Chimera, MD for primary care. I spoke with  [name] by phone today.  What matters to the patients health and wellness today?  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  Has already followed up with cardiology, will see PCP today for follow up.  State she has le swelling, encouraged to use compression socks.      Goals Addressed               This Visit's Progress     No readmissions (pt-stated)        Care Coordination Interventions: Provided education on importance of blood pressure control in management of CAD Provided education on Importance of limiting foods high in cholesterol Reviewed Importance of taking all medications as prescribed Reviewed Importance of attending all scheduled provider appointments Assessed social determinant of health barriers Reminded of need for AWV Advised to continue daily monitoring of weight and BP Referral placed to pharmacy team regarding concern for cost of Eliquis         SDOH assessments and interventions completed:  Yes  SDOH Interventions Today    Flowsheet Row Most Recent Value  SDOH Interventions   Food Insecurity Interventions Intervention Not Indicated  Financial Strain Interventions Other (Comment)  [Referral to pharmacy team]  Housing Interventions Intervention Not Indicated  Transportation Interventions Intervention Not Indicated        Care Coordination Interventions Activated:  Yes  Care Coordination Interventions:  Yes, provided   Follow up plan: Follow up call scheduled for 10/4    Encounter Outcome:  Pt. Visit Completed   Kemper Durie, RN, MSN, [facility] [place] Care Management Care Management Coordinator [phone]

## 2022-01-18 NOTE — Patient Instructions (Signed)
Visit Information  Thank you for taking time to visit with me today. Please don't hesitate to contact me if I can be of assistance to you before our next scheduled telephone appointment.  Following are the goals we discussed today:  Continue daily monitoring of weights and BP  Adhere to heart healthy diet  Our next appointment is by telephone on 10/4   Please call the care guide team at [phone] if you need to cancel or reschedule your appointment.   Please call the Suicide and Crisis Lifeline: 988 call the [place] National Suicide Prevention Lifeline: 606-051-5670 or TTY: (508)226-1152 TTY 5803624481) to talk to a trained counselor call 1-800-273-TALK (toll free, 24 hour hotline) call the [facility]: 336-284-6531 call 911 if you are experiencing a Mental Health or Behavioral Health Crisis or need someone to talk to.  The patient verbalized understanding of instructions, educational materials, and care plan provided today and agreed to receive a mailed copy of patient instructions, educational materials, and care plan.   The patient has been provided with contact information for the care management team and has been advised to call with any health related questions or concerns.   Kemper Durie, RN, MSN, [facility] Care Management Care Management Coordinator [phone]

## 2022-01-24 DIAGNOSIS — Z0001 Encounter for general adult medical examination with abnormal findings: Secondary | ICD-10-CM | POA: Diagnosis not present

## 2022-01-24 DIAGNOSIS — E782 Mixed hyperlipidemia: Secondary | ICD-10-CM | POA: Diagnosis not present

## 2022-01-24 DIAGNOSIS — E039 Hypothyroidism, unspecified: Secondary | ICD-10-CM | POA: Diagnosis not present

## 2022-01-24 DIAGNOSIS — Z6822 Body mass index (BMI) 22.0-22.9, adult: Secondary | ICD-10-CM | POA: Diagnosis not present

## 2022-01-24 DIAGNOSIS — E1165 Type 2 diabetes mellitus with hyperglycemia: Secondary | ICD-10-CM | POA: Diagnosis not present

## 2022-01-24 DIAGNOSIS — E785 Hyperlipidemia, unspecified: Secondary | ICD-10-CM | POA: Diagnosis not present

## 2022-01-24 DIAGNOSIS — K2100 Gastro-esophageal reflux disease with esophagitis, without bleeding: Secondary | ICD-10-CM | POA: Diagnosis not present

## 2022-01-24 DIAGNOSIS — N1831 Chronic kidney disease, stage 3a: Secondary | ICD-10-CM | POA: Diagnosis not present

## 2022-01-24 DIAGNOSIS — E1151 Type 2 diabetes mellitus with diabetic peripheral angiopathy without gangrene: Secondary | ICD-10-CM | POA: Diagnosis not present

## 2022-01-24 DIAGNOSIS — R5382 Chronic fatigue, unspecified: Secondary | ICD-10-CM | POA: Diagnosis not present

## 2022-01-24 DIAGNOSIS — E860 Dehydration: Secondary | ICD-10-CM | POA: Diagnosis not present

## 2022-01-24 DIAGNOSIS — Z794 Long term (current) use of insulin: Secondary | ICD-10-CM | POA: Diagnosis not present

## 2022-01-24 DIAGNOSIS — E7849 Other hyperlipidemia: Secondary | ICD-10-CM | POA: Diagnosis not present

## 2022-01-28 DIAGNOSIS — E1151 Type 2 diabetes mellitus with diabetic peripheral angiopathy without gangrene: Secondary | ICD-10-CM | POA: Diagnosis not present

## 2022-01-28 DIAGNOSIS — J449 Chronic obstructive pulmonary disease, unspecified: Secondary | ICD-10-CM | POA: Diagnosis not present

## 2022-01-28 DIAGNOSIS — I5020 Unspecified systolic (congestive) heart failure: Secondary | ICD-10-CM | POA: Diagnosis not present

## 2022-01-28 DIAGNOSIS — N1831 Chronic kidney disease, stage 3a: Secondary | ICD-10-CM | POA: Diagnosis not present

## 2022-01-28 DIAGNOSIS — F331 Major depressive disorder, recurrent, moderate: Secondary | ICD-10-CM | POA: Diagnosis not present

## 2022-01-28 DIAGNOSIS — I10 Essential (primary) hypertension: Secondary | ICD-10-CM | POA: Diagnosis not present

## 2022-01-28 DIAGNOSIS — I25119 Atherosclerotic heart disease of native coronary artery with unspecified angina pectoris: Secondary | ICD-10-CM | POA: Diagnosis not present

## 2022-01-28 DIAGNOSIS — E7849 Other hyperlipidemia: Secondary | ICD-10-CM | POA: Diagnosis not present

## 2022-01-28 DIAGNOSIS — Z0001 Encounter for general adult medical examination with abnormal findings: Secondary | ICD-10-CM | POA: Diagnosis not present

## 2022-01-30 ENCOUNTER — Ambulatory Visit (HOSPITAL_COMMUNITY): Payer: Medicare HMO | Attending: Family Medicine

## 2022-02-01 DIAGNOSIS — I2510 Atherosclerotic heart disease of native coronary artery without angina pectoris: Secondary | ICD-10-CM | POA: Diagnosis not present

## 2022-02-01 DIAGNOSIS — I110 Hypertensive heart disease with heart failure: Secondary | ICD-10-CM | POA: Diagnosis not present

## 2022-02-01 DIAGNOSIS — I5043 Acute on chronic combined systolic (congestive) and diastolic (congestive) heart failure: Secondary | ICD-10-CM | POA: Diagnosis not present

## 2022-02-01 DIAGNOSIS — I214 Non-ST elevation (NSTEMI) myocardial infarction: Secondary | ICD-10-CM | POA: Diagnosis not present

## 2022-02-01 DIAGNOSIS — I255 Ischemic cardiomyopathy: Secondary | ICD-10-CM | POA: Diagnosis not present

## 2022-02-01 DIAGNOSIS — I700 Atherosclerosis of aorta: Secondary | ICD-10-CM | POA: Diagnosis not present

## 2022-02-01 DIAGNOSIS — J439 Emphysema, unspecified: Secondary | ICD-10-CM | POA: Diagnosis not present

## 2022-02-01 DIAGNOSIS — Z48812 Encounter for surgical aftercare following surgery on the circulatory system: Secondary | ICD-10-CM | POA: Diagnosis not present

## 2022-02-04 ENCOUNTER — Other Ambulatory Visit: Payer: Medicare HMO | Admitting: Cardiology

## 2022-02-16 DIAGNOSIS — K2100 Gastro-esophageal reflux disease with esophagitis, without bleeding: Secondary | ICD-10-CM | POA: Diagnosis not present

## 2022-02-16 DIAGNOSIS — I10 Essential (primary) hypertension: Secondary | ICD-10-CM | POA: Diagnosis not present

## 2022-02-19 ENCOUNTER — Telehealth: Payer: Self-pay | Admitting: Cardiology

## 2022-02-19 NOTE — Telephone Encounter (Signed)
Patient informed and verbalized understanding of plan. 

## 2022-02-19 NOTE — Telephone Encounter (Signed)
Reports swelling in legs and feet with right being worse than left side for the past 2 months Reports some pain with walking and redness near right ankle. Denies calf pain. Reports dizziness and sob all the time. Did not sound SOB on phone. Denies chest pain. BP 173/64 HR 64 O2 Sat 97. Reports she usually takes lasix 60 mg daily with potassium 20 meq daily. Reports increasing lasix to 80 mg on Sunday on her own and swelling has not improved. Says her PCP increased her lasix 80 daily for 3 days 2 months ago when swelling started and symptoms improved. Does not weigh at home but reports weight loss.

## 2022-02-19 NOTE — Telephone Encounter (Signed)
Pt c/o swelling: STAT is pt has developed SOB within 24 hours  How much weight have you gained and in what time span? No, patient states she has lost weight  If swelling, where is the swelling located? Leg   Are you currently taking a fluid pill? yes  Are you currently SOB? No more then usually   Do you have a log of your daily weights (if so, list)? no  Have you gained 3 pounds in a day or 5 pounds in a week? no  Have you traveled recently? no

## 2022-02-20 ENCOUNTER — Encounter: Payer: Self-pay | Admitting: *Deleted

## 2022-02-20 ENCOUNTER — Ambulatory Visit: Payer: Self-pay | Admitting: *Deleted

## 2022-02-20 NOTE — Patient Outreach (Signed)
Care Coordination   Follow Up Visit Note   02/20/2022 Name: Debbie [name] MRN: [id number] DOB: 11-07-1940  Debbie [name] is a 81 y.o. year old female who sees Richardean Chimera, MD for primary care. I spoke with  Debbie [name] by phone today.  What matters to the patients health and wellness today?  Manage lower extremity edema    Goals Addressed               This Visit's Progress     Patient Stated     No readmissions (pt-stated)   On track     Care Coordination Interventions: Provided education on importance of blood pressure control in management of CAD Provided education on Importance of limiting foods high in cholesterol Reviewed Importance of taking all medications as prescribed Reviewed Importance of attending all scheduled provider appointments Assessed social determinant of health barriers Reminded of need for AWV Advised to continue daily monitoring of weight and BP Referral placed to pharmacy team regarding concern for cost of Eliquis       Other     Manage Lower Extremity Edema        Care Coordination Interventions: Evaluation of current treatment plan related to lower extremity edema and patient's adherence to plan as established by provider Advised patient to follow a low sodium DASH diet Reviewed medications with patient and discussed use of lasix to reduce swelling. Cardiologist approved increasing from 60mg  daily to 80mg  for an additional 3-4 days, along with a potassium supplement, to aid in fluid removel Collaborated with Dawn, Pharmacist with [facility], regarding Flomax. Patient uses this for urinary retention and isn't sure if it is in her pill pack supplied by [facility]. Reviewed medication history and it was prescribed by Dr in September. Reviewed scheduled/upcoming provider appointments including an echocardiogram on 03/14/22 at 10:30 at [facility] and cardio appt with 03/16/22, PA-C on 03/20/22 at 3:00 Assessed  social determinant of health barriers Reviewed and discussed medications. Patient has these prepackaged by [facility]. She would like to have Flomax included in next months package that is scheduled for delivery on 11/16. Discussed lower extremity edema.  Increase in furosemide did help to decrease the swelling but it returned once she went back down to 60mg  daily.  Legs are not swollen when she wakes up but swell throughout the day Advised to prop her legs up when sitting and to wear compression hose/socks. Advised that [facility] in [place] has compression socks in stock and to ask pharmacist for assistance if she has problems finding them. Explained that they should fit over her calf but below the knee. Explained that in order for lasix to work properly, she has to be able to urinate more to remove the fluid. If her bladder isn't emptying properly, she's likely not getting the full benefit from Lasix Follow up telephone call scheduled with 12/16, RN Care Coordinator [phone]) for 03/25/22 at 11:00 Encouraged to reach out to [facility] as needed            SDOH assessments and interventions completed:  Yes  SDOH Interventions Today    Flowsheet Row Most Recent Value  SDOH Interventions   Transportation Interventions Intervention Not Indicated  Financial Strain Interventions Intervention Not Indicated        Care Coordination Interventions Activated:  Yes  Care Coordination Interventions:  Yes, provided   Follow up plan: Follow up call scheduled for 03/25/22 at 11:00 with 13/6/23, RN Care Coordinator (  [phone])   Encounter Outcome:  Pt. Visit Completed   Chong Sicilian, BSN, RN-BC RN Care Coordinator [place] Direct Dial: [phone] Main #: [phone]

## 2022-03-01 DIAGNOSIS — I5020 Unspecified systolic (congestive) heart failure: Secondary | ICD-10-CM | POA: Diagnosis not present

## 2022-03-04 DIAGNOSIS — Z6823 Body mass index (BMI) 23.0-23.9, adult: Secondary | ICD-10-CM | POA: Diagnosis not present

## 2022-03-04 DIAGNOSIS — M25561 Pain in right knee: Secondary | ICD-10-CM | POA: Diagnosis not present

## 2022-03-14 ENCOUNTER — Telehealth: Payer: Self-pay | Admitting: Student

## 2022-03-14 ENCOUNTER — Ambulatory Visit: Payer: Medicare HMO | Attending: Student

## 2022-03-14 DIAGNOSIS — I513 Intracardiac thrombosis, not elsewhere classified: Secondary | ICD-10-CM

## 2022-03-14 DIAGNOSIS — R262 Difficulty in walking, not elsewhere classified: Secondary | ICD-10-CM | POA: Diagnosis not present

## 2022-03-14 DIAGNOSIS — J45909 Unspecified asthma, uncomplicated: Secondary | ICD-10-CM | POA: Diagnosis not present

## 2022-03-14 DIAGNOSIS — R0689 Other abnormalities of breathing: Secondary | ICD-10-CM | POA: Diagnosis not present

## 2022-03-14 DIAGNOSIS — R079 Chest pain, unspecified: Secondary | ICD-10-CM | POA: Diagnosis not present

## 2022-03-14 DIAGNOSIS — E785 Hyperlipidemia, unspecified: Secondary | ICD-10-CM | POA: Diagnosis not present

## 2022-03-14 DIAGNOSIS — I509 Heart failure, unspecified: Secondary | ICD-10-CM | POA: Diagnosis not present

## 2022-03-14 DIAGNOSIS — I959 Hypotension, unspecified: Secondary | ICD-10-CM | POA: Diagnosis not present

## 2022-03-14 DIAGNOSIS — I2510 Atherosclerotic heart disease of native coronary artery without angina pectoris: Secondary | ICD-10-CM | POA: Diagnosis not present

## 2022-03-14 DIAGNOSIS — R609 Edema, unspecified: Secondary | ICD-10-CM | POA: Diagnosis not present

## 2022-03-14 DIAGNOSIS — T68XXXA Hypothermia, initial encounter: Secondary | ICD-10-CM | POA: Diagnosis not present

## 2022-03-14 DIAGNOSIS — I110 Hypertensive heart disease with heart failure: Secondary | ICD-10-CM | POA: Diagnosis not present

## 2022-03-14 DIAGNOSIS — F419 Anxiety disorder, unspecified: Secondary | ICD-10-CM | POA: Diagnosis not present

## 2022-03-14 DIAGNOSIS — M25561 Pain in right knee: Secondary | ICD-10-CM | POA: Diagnosis not present

## 2022-03-14 DIAGNOSIS — J449 Chronic obstructive pulmonary disease, unspecified: Secondary | ICD-10-CM | POA: Diagnosis not present

## 2022-03-14 LAB — ECHOCARDIOGRAM LIMITED
Calc EF: 60.1 %
S' Lateral: 3.22 cm
Single Plane A2C EF: 57.2 %
Single Plane A4C EF: 63.5 %

## 2022-03-14 MED ORDER — PERFLUTREN LIPID MICROSPHERE
1.0000 mL | INTRAVENOUS | Status: AC | PRN
  Administered 2022-03-14: 1 mL via INTRAVENOUS

## 2022-03-14 NOTE — Telephone Encounter (Signed)
Error

## 2022-03-18 ENCOUNTER — Other Ambulatory Visit (HOSPITAL_COMMUNITY): Payer: Self-pay

## 2022-03-18 DIAGNOSIS — R030 Elevated blood-pressure reading, without diagnosis of hypertension: Secondary | ICD-10-CM | POA: Diagnosis not present

## 2022-03-18 DIAGNOSIS — M1711 Unilateral primary osteoarthritis, right knee: Secondary | ICD-10-CM | POA: Diagnosis not present

## 2022-03-18 DIAGNOSIS — Z23 Encounter for immunization: Secondary | ICD-10-CM | POA: Diagnosis not present

## 2022-03-18 DIAGNOSIS — Z6822 Body mass index (BMI) 22.0-22.9, adult: Secondary | ICD-10-CM | POA: Diagnosis not present

## 2022-03-19 DIAGNOSIS — K2100 Gastro-esophageal reflux disease with esophagitis, without bleeding: Secondary | ICD-10-CM | POA: Diagnosis not present

## 2022-03-19 DIAGNOSIS — I10 Essential (primary) hypertension: Secondary | ICD-10-CM | POA: Diagnosis not present

## 2022-03-20 ENCOUNTER — Ambulatory Visit: Payer: Medicare HMO | Admitting: Student

## 2022-03-20 NOTE — Progress Notes (Deleted)
Cardiology Office Note    Date:  03/20/2022   ID:  Debbie [name], DOB 05-25-1940, MRN [id number]  PCP:  Richardean Chimera, MD  Cardiologist: [name], MD    No chief complaint on file.   History of Present Illness:    Debbie [name] is a 81 y.o. female with past medical history of CAD (s/p CABG in 2006, s/p NSTEMI in 11/2021 with cath showing patent LIMA-LAD and occlusion of all vein grafts --> underwent DES to LM and LCx and staged DES to SVG-D1), HFrEF (EF 25-30% by echo in 11/2021), LV thrombus, HTN, HLD and COPD who presents to the office today for 48-month follow-up.  She was last examined myself in 11/2021 following a recent admission for generalized weakness and a mild CHF exacerbation.  She reported still feeling weak at the time of her office visit was planned to start PT. Was walking for 10 to 15 minutes at a time without anginal symptoms and her weight had been stable at 121 - 122 lbs on her home scales.  She was continued on bisoprolol, Lasix and Imdur for cardiomyopathy as BP did not previously allow for ACE-I/ARB/ARNI/Spiro and she was no longer on SGLT2 inhibitor therapy given recent cystitis.  She was on Eliquis given her LV thrombus and was recommended plan for repeat limited echocardiogram in 3 months for reassessment of her thrombus and EF.  She did call the office in 02/2022 reporting worsening lower extremity edema for the past few months and had been taking Lasix 60 mg daily but recently increased to 80 mg daily on her own given her symptoms.  It was recommended for her to take Lasix 80 mg daily for 3 to 4 days and report back if symptoms did not improve.  She did have a repeat limited echocardiogram on 03/14/2022 and this showed that her ejection fraction had improved to 55 to 60%.  She did have loosely organized thrombotic material and slow flow at the LV apex but no clearly formed mural thrombus.  RV function was normal.  Past Medical History:  Diagnosis  Date   Allergic rhinitis    Anxiety    Arthritis    Asthma    Bladder spasms    Carotid artery disease (HCC)    Collagen vascular disease (HCC)    COPD (chronic obstructive pulmonary disease) (HCC)    Cystocele    Essential hypertension    GERD (gastroesophageal reflux disease)    Hyperlipidemia    Hypothyroidism    Ischemic cardiomyopathy 12/02/2021   Echo 11/27/2021:     Multiple vessel coronary artery disease => CABG (LIMA-dLAD, SVG-Diag, Seq SVG-OM-OM-PDA)    a. s/p CABG in 2006 b. NSTEMI in 11/2021 with DES to LM and LCx and repeat cath later in admission with DES to SVG-D1   NSTEMI (non-ST elevated myocardial infarction) (HCC) 11/22/2021   Osteoporosis    Renal insufficiency    Vaginal vault prolapse     Past Surgical History:  Procedure Laterality Date   ANTERIOR AND POSTERIOR REPAIR N/A 04/06/2013   Procedure: CYSTOCELE REPAIR ;  Surgeon: Martina Sinner, MD;  Location: [facility];  Service: Urology;  Laterality: N/A;   CATARACTS     REMOVED   COLONOSCOPY     CORNEA LACERATION REPAIR     CORONARY ARTERY BYPASS GRAFT     X 5 VESSELS   CORONARY STENT INTERVENTION N/A 11/23/2021   Procedure: CORONARY STENT INTERVENTION;  Surgeon: Tonny Bollman, MD;  Location:  [facility];  Service: Cardiovascular;  Laterality: N/A;   CORONARY/GRAFT ACUTE MI REVASCULARIZATION N/A 11/24/2021   Procedure: Coronary/Graft Acute MI Revascularization;  Surgeon: Lorretta Harp, MD;  Location: [facility];  Service: Cardiovascular;  Laterality: N/A;   CYSTOSCOPY N/A 04/06/2013   Procedure: CYSTOSCOPY;  Surgeon: Reece Packer, MD;  Location: [facility];  Service: Urology;  Laterality: N/A;   HERNIA REPAIR     LEFT HEART CATH AND CORONARY ANGIOGRAPHY N/A 11/24/2021   Procedure: LEFT HEART CATH AND CORONARY ANGIOGRAPHY;  Surgeon: Lorretta Harp, MD;  Location: [facility];  Service: Cardiovascular;  Laterality: N/A;   LEFT HEART CATH AND CORS/GRAFTS ANGIOGRAPHY N/A 11/23/2021    Procedure: LEFT HEART CATH AND CORS/GRAFTS ANGIOGRAPHY;  Surgeon: Sherren Mocha, MD;  Location: [facility];  Service: Cardiovascular;  Laterality: N/A;   SEPTOPLASTY N/A 04/28/2015   Procedure: SEPTOPLASTY;  Surgeon: Ruby Cola, MD;  Location: [facility];  Service: ENT;  Laterality: N/A;   SINUS ENDO W/FUSION Bilateral 04/28/2015   Procedure: ENDOSCOPIC SINUS SURGERY WITH NAVIGATION;  Surgeon: Ruby Cola, MD;  Location: [facility];  Service: ENT;  Laterality: Bilateral;   TUBAL LIGATION      Current Medications: Outpatient Medications Prior to Visit  Medication Sig Dispense Refill   acetaminophen (TYLENOL) 650 MG CR tablet Take 650 mg by mouth in the morning.     apixaban (ELIQUIS) 2.5 MG TABS tablet Take 1 tablet (2.5 mg total) by mouth 2 (two) times daily. 60 tablet 11   atorvastatin (LIPITOR) 80 MG tablet Take 1 tablet (80 mg total) by mouth at bedtime. 30 tablet 3   bisoprolol (ZEBETA) 5 MG tablet Take 0.5 tablets (2.5 mg total) by mouth daily. 15 tablet 1   clopidogrel (PLAVIX) 75 MG tablet Take 1 tablet (75 mg total) by mouth daily. 90 tablet 3   ezetimibe (ZETIA) 10 MG tablet Take 1 tablet (10 mg total) by mouth daily. 30 tablet 11   famotidine (PEPCID) 20 MG tablet Take 40 mg by mouth at bedtime.     furosemide (LASIX) 40 MG tablet Take 1 tablet (40 mg total) by mouth daily. (Patient taking differently: Take 60 mg by mouth daily.) 30 tablet 1   isosorbide mononitrate (IMDUR) 30 MG 24 hr tablet Take 1 tablet (30 mg total) by mouth daily. 90 tablet 2   levocetirizine (XYZAL) 5 MG tablet Take 5 mg by mouth every evening. Equate levocetirizine.     levothyroxine (SYNTHROID) 88 MCG tablet Take 88 mcg by mouth daily.     nitroGLYCERIN (NITROSTAT) 0.4 MG SL tablet Place 1 tablet (0.4 mg total) under the tongue every 5 (five) minutes x 3 doses as needed for chest pain (if no relief after 3rd dose, call 911 or proceed to the ED for an evaluation). 25 tablet 2   ondansetron (ZOFRAN) 4 MG  tablet Take 1 tablet (4 mg total) by mouth every 6 (six) hours as needed for nausea. 20 tablet 0   pantoprazole (PROTONIX) 40 MG tablet Take 40 mg by mouth daily.     potassium chloride (KLOR-CON M) 10 MEQ tablet Take 1 tablet (10 mEq total) by mouth daily. (Patient taking differently: Take 20 mEq by mouth daily.) 120 tablet 3   tamsulosin (FLOMAX) 0.4 MG CAPS capsule Take 0.4 mg by mouth.     No facility-administered medications prior to visit.     Allergies:   Ivp dye [iodinated contrast media], Betadine [povidone iodine], Codeine, Other,  Penicillins, and Sulfa antibiotics   Social History   Socioeconomic History   Marital status: Legally Separated    Spouse name: Not on file   Number of children: 5   Years of education: Not on file   Highest education level: Not on file  Occupational History   Occupation: Retired  Tobacco Use   Smoking status: Never   Smokeless tobacco: Never  Vaping Use   Vaping Use: Never used  Substance and Sexual Activity   Alcohol use: Yes    Alcohol/week: 0.0 standard drinks of alcohol    Comment: 1 drink 1 time a year   Drug use: No   Sexual activity: Not on file  Other Topics Concern   Not on file  Social History Narrative   Not on file   Social Determinants of Health   Financial Resource Strain: Low Risk  (02/20/2022)   Overall Financial Resource Strain (CARDIA)    Difficulty of Paying Living Expenses: Not very hard  Recent Concern: Financial Resource Strain - Medium Risk (01/18/2022)   Overall Financial Resource Strain (CARDIA)    Difficulty of Paying Living Expenses: Somewhat hard  Food Insecurity: No Food Insecurity (01/18/2022)   Hunger Vital Sign    Worried About Running Out of Food in the Last Year: Never true    Ran Out of Food in the Last Year: Never true  Transportation Needs: No Transportation Needs (02/20/2022)   PRAPARE - Administrator, Civil Service (Medical): No    Lack of Transportation (Non-Medical): No  Physical  Activity: Unknown (12/31/2018)   Exercise Vital Sign    Days of Exercise per Week: Patient refused    Minutes of Exercise per Session: Patient refused  Stress: Unknown (12/31/2018)   [employer] of Occupational Health - Occupational Stress Questionnaire    Feeling of Stress : Patient refused  Social Connections: Unknown (12/31/2018)   Social Connection and Isolation Panel [NHANES]    Frequency of Communication with Friends and Family: Patient refused    Frequency of Social Gatherings with Friends and Family: Patient refused    Attends Religious Services: Patient refused    Database administrator or Organizations: Patient refused    Attends Engineer, structural: Patient refused    Marital Status: Patient refused     Family History:  The patient's ***family history includes Cirrhosis in her father; Diabetes Mellitus II in her mother; Heart Problems in her mother; Lung disease in her father.   Review of Systems:    Please see the history of present illness.     All other systems reviewed and are otherwise negative except as noted above.   Physical Exam:    VS:  There were no vitals taken for this visit.   General: Well developed, well nourished,female appearing in no acute distress. Head: Normocephalic, atraumatic. Neck: No carotid bruits. JVD not elevated.  Lungs: Respirations regular and unlabored, without wheezes or rales.  Heart: ***Regular rate and rhythm. No S3 or S4.  No murmur, no rubs, or gallops appreciated. Abdomen: Appears non-distended. No obvious abdominal masses. Msk:  Strength and tone appear normal for age. No obvious joint deformities or effusions. Extremities: No clubbing or cyanosis. No edema.  Distal pedal pulses are 2+ bilaterally. Neuro: Alert and oriented X 3. Moves all extremities spontaneously. No focal deficits noted. Psych:  Responds to questions appropriately with a normal affect. Skin: No rashes or lesions noted  Wt Readings from Last 3  Encounters:  12/12/21 128  lb 9.6 oz (58.3 kg)  12/07/21 131 lb 9.8 oz (59.7 kg)  12/01/21 138 lb 7.2 oz (62.8 kg)        Studies/Labs Reviewed:   EKG:  EKG is*** ordered today.  The ekg ordered today demonstrates ***  Recent Labs: 11/22/2021: ALT 16 12/05/2021: TSH 8.232 12/06/2021: Hemoglobin 8.2; Platelets 520 12/07/2021: B Natriuretic Peptide 212.0; BUN 9; Creatinine, Ser 0.85; Magnesium 2.0; Potassium 3.5; Sodium 139   Lipid Panel    Component Value Date/Time   CHOL 123 12/06/2021 0527   TRIG 125 12/06/2021 0527   HDL 31 (L) 12/06/2021 0527   CHOLHDL 4.0 12/06/2021 0527   VLDL 25 12/06/2021 0527   LDLCALC 67 12/06/2021 0527    Additional studies/ records that were reviewed today include:   Cardiac Catheterization: 11/23/2021 1.  Severe native vessel coronary artery disease with severe ostial left main stenosis, total occlusion of the LAD, severe stenosis in the distal left main/circumflex junction, severe diffuse stenosis of the distal AV circumflex, and a diffusely diseased nondominant RCA 2.  Status post aortocoronary bypass surgery with continued patency of the LIMA to LAD and occlusion of the saphenous vein grafts 3.  Successful PCI of the native left main and native circumflex using a 4.0 x 24 mm Synergy DES  Recommendation: Dual antiplatelet therapy with aspirin and ticagrelor at least 12 months, aggressive risk reduction measures, suspect the patient occluded a vein graft causing her acute coronary syndrome.  There were no vein graft targets for PCI.  Her native left main into the circumflex is treated as this large territory is now potentially ischemic after occlusion of the sequential saphenous vein graft to OM and PLA.   Cardiac Catheterization: 11/24/2021 Dist LM to Prox LAD lesion is 100% stenosed.   Ost LM to Dist LM lesion is 25% stenosed.   Dist Cx lesion is 50% stenosed.   Origin to Prox Graft lesion is 99% stenosed.   Previously placed Ost Cx to Prox Cx  stent of unknown type is  widely patent.   A drug-eluting stent was successfully placed using a STENT ONYX FRONTIER 3.0X18.   Post intervention, there is a 0% residual stenosis. IMPRESSION: Widely patent left main into the circumflex stent performed yesterday by Dr. Excell Seltzerooper.  Atretic appearing LIMA to a small distal LAD.  Patent sequential vein to an OM and PDA with high-grade proximal SVG to diagonal vein graft stenosis probably representing the "culprit lesion with an excellent angiographic result after PCI drug-eluting stenting.  Hopefully her anterior wall stunned and will come back.  The sheath will be removed once ACT falls below 170 pressure held.  I am going to restart heparin 4 hours after sheath removal for 24 hours.  Given her elevated LVEDP, she probably should be quire some diuresis as well.  She left the lab in stable condition.   Limited Echo: 02/2022 IMPRESSIONS     1. Limited study.   2. Left ventricular ejection fraction, by estimation, is 55 to 60%. The  left ventricle has normal function. The left ventricle demonstrates  regional wall motion abnormalities (see scoring diagram/findings for  description).   3. Definity contrast defines loosely organized thormbotic material and  slow flow at LV apex, but no clearly formed mural thrombus.   4. Right ventricular systolic function is normal. The right ventricular  size is normal.   5. The aortic valve is tricuspid. Aortic valve regurgitation is not  visualized.   Assessment:    No diagnosis found.  Plan:   In order of problems listed above:  ***    Shared Decision Making/Informed Consent:   {Are you ordering a CV Procedure (e.g. stress test, cath, DCCV, TEE, etc)?   Press F2        :[id number]}    Medication Adjustments/Labs and Tests Ordered: Current medicines are reviewed at length with the patient today.  Concerns regarding medicines are outlined above.  Medication changes, Labs and Tests ordered today are  listed in the Patient Instructions below. There are no Patient Instructions on file for this visit.   Signed, Ellsworth Lennox, PA-C  03/20/2022 11:01 AM    [facility] [address] [place], [place] [address] Phone: [phone] Fax: [phone]

## 2022-03-21 ENCOUNTER — Encounter: Payer: Self-pay | Admitting: Student

## 2022-03-21 DIAGNOSIS — R030 Elevated blood-pressure reading, without diagnosis of hypertension: Secondary | ICD-10-CM | POA: Diagnosis not present

## 2022-03-21 DIAGNOSIS — Z6823 Body mass index (BMI) 23.0-23.9, adult: Secondary | ICD-10-CM | POA: Diagnosis not present

## 2022-03-21 DIAGNOSIS — M1711 Unilateral primary osteoarthritis, right knee: Secondary | ICD-10-CM | POA: Diagnosis not present

## 2022-03-25 ENCOUNTER — Encounter: Payer: Self-pay | Admitting: *Deleted

## 2022-03-25 ENCOUNTER — Ambulatory Visit: Payer: Self-pay | Admitting: *Deleted

## 2022-03-25 NOTE — Patient Outreach (Signed)
  Care Coordination   Follow Up Visit Note   03/25/2022 Name: ADELENE POLIVKA MRN: [id number] DOB: [birth year]  TOIYA MORRISH is a 81 y.o. year old female who sees Caryl Bis, MD for primary care. I spoke with  Chriss Czar by phone today.  What matters to the patients health and wellness today?  Management of LE edema    Goals Addressed               This Visit's Progress     Patient Stated     COMPLETED: No readmissions (pt-stated)        Care Coordination Interventions: Provided education on importance of blood pressure control in management of CAD Provided education on Importance of limiting foods high in cholesterol Reviewed Importance of taking all medications as prescribed Reviewed Importance of attending all scheduled provider appointments Assessed social determinant of health barriers Reminded of need for AWV Advised to continue daily monitoring of weight and BP Referral placed to pharmacy team regarding concern for cost of Eliquis       Other     Manage Lower Extremity Edema        Care Coordination Interventions: Evaluation of current treatment plan related to lower extremity edema and patient's adherence to plan as established by provider Advised patient to follow a low sodium DASH diet Reviewed medications with patient and discussed use of lasix to reduce swelling. Collaborated with Dawn, Pharmacist with [facility], regarding Flomax. Dawn verified that Flomax was included in her pill pack. Discussed plans with patient for ongoing care management follow up and provided patient with direct contact information for care management team Assessed social determinant of health barriers Discussed lower extremity edema.  Encouraged to continue propping her legs up when sitting sitting and to wear compression hose/socks. Verified that she has purchased compression socks but only wears them when she goes out.  Reviewed echo results and discussed No Show  for appt with cardio on 11/1 to review those results. She wasn't feeling well. Advised that she needs to be seen to discuss this and edema management Reached out to [place], PA-C via in-basket message and asked that she have her office staff contact patient to reschedule Verified that patient is able to drive to appointments in [place] Referral to [facility] guides for assistance with transportation outside of the county. Does not have a current need, but would like information for future use Provided with RNCC contact number and encouraged to reach out as needed           SDOH assessments and interventions completed:  Yes  SDOH Interventions Today    Flowsheet Row Most Recent Value  SDOH Interventions   Housing Interventions Intervention Not Indicated  Transportation Interventions Intervention Not Indicated        Care Coordination Interventions Activated:  Yes  Care Coordination Interventions:  Yes, provided   Follow up plan: Follow up call scheduled for 04/24/22 at 11:30    Encounter Outcome:  Pt. Visit Completed   Chong Sicilian, BSN, RN-BC RN Care Coordinator [place] Direct Dial: [phone] Main #: [phone]

## 2022-03-26 ENCOUNTER — Other Ambulatory Visit: Payer: Self-pay

## 2022-03-26 ENCOUNTER — Telehealth: Payer: Self-pay | Admitting: Cardiology

## 2022-03-26 MED ORDER — CLOPIDOGREL BISULFATE 75 MG PO TABS: 75.0000 mg | ORAL_TABLET | Freq: Every day | ORAL | 3 refills | Status: DC

## 2022-03-26 MED ORDER — CLOPIDOGREL BISULFATE 75 MG PO TABS: 75.0000 mg | ORAL_TABLET | Freq: Every day | ORAL | 0 refills | Status: AC

## 2022-03-26 MED ORDER — CLOPIDOGREL BISULFATE 75 MG PO TABS: 75.0000 mg | ORAL_TABLET | Freq: Every day | ORAL | 1 refills | Status: DC

## 2022-03-26 NOTE — Telephone Encounter (Signed)
Reiterated to pt to hold Eliquis, continue Plavix, do not take Aspirin. Patient verbalized understanding. Will send in Rx for refill for Plavix.

## 2022-03-26 NOTE — Telephone Encounter (Signed)
Patient states that she had stints placed around Sept or October and has since been taking 5-6 tylenol daily for pain. States that on Friday, her right leg was black and blue and that she stopped taking Tylenol on Monday. States that yesterday she had fluttering, weakness in chest, fatigue and dizziness. States that she woke up this morning feeling the same but felt better once she ate something. States that her PCP told her to stop taking the aspirin. Wants to know what Dr. Domenic Polite thinks. Please advise.

## 2022-03-26 NOTE — Telephone Encounter (Signed)
Patient made aware and states that she is not taking the aspirin and will consult with PCP regarding her leg bruising.

## 2022-03-26 NOTE — Telephone Encounter (Signed)
  Pt c/o medication issue:  1. Name of Medication:   apixaban (ELIQUIS) 2.5 MG TABS tablet   clopidogrel (PLAVIX) 75 MG tablet   2. How are you currently taking this medication (dosage and times per day)? As written  3. Are you having a reaction (difficulty breathing--STAT)? No   4. What is your medication issue? Pt had surgery and been taking tylenol for pain. She said, she is taking 5-6 tylenol in 24 hour period. She said, she's started to get bruises on her arms and yesterday she's getting bruises on her legs. Also, yesterday she had feeling of fluttering all day but not today

## 2022-03-26 NOTE — Telephone Encounter (Signed)
Pt c/o medication issue:  1. Name of Medication: apixaban (ELIQUIS) 2.5 MG TABS tablet  2. How are you currently taking this medication (dosage and times per day)?   3. Are you having a reaction (difficulty breathing--STAT)?   4. What is your medication issue?   Patient is following up to clarify medication instructions again. She assumes she was advised to stop taking Eliquis and start on Plavix--please clarify. If this is correct, she states she does not have any Plavix.

## 2022-03-26 NOTE — Addendum Note (Signed)
Addended by: Christella Scheuermann C on: 03/26/2022 02:40 PM   Modules accepted: Orders

## 2022-04-01 DIAGNOSIS — S82091D Other fracture of right patella, subsequent encounter for closed fracture with routine healing: Secondary | ICD-10-CM | POA: Diagnosis not present

## 2022-04-01 DIAGNOSIS — M1711 Unilateral primary osteoarthritis, right knee: Secondary | ICD-10-CM | POA: Diagnosis not present

## 2022-04-02 ENCOUNTER — Other Ambulatory Visit (HOSPITAL_COMMUNITY): Payer: Self-pay | Admitting: Sports Medicine

## 2022-04-02 DIAGNOSIS — M19011 Primary osteoarthritis, right shoulder: Secondary | ICD-10-CM

## 2022-04-02 DIAGNOSIS — M25561 Pain in right knee: Secondary | ICD-10-CM

## 2022-04-16 ENCOUNTER — Encounter: Payer: Self-pay | Admitting: Student

## 2022-04-16 ENCOUNTER — Ambulatory Visit: Payer: Medicare HMO | Attending: Student | Admitting: Student

## 2022-04-16 VITALS — BP 112/60 | HR 64 | Ht 63.0 in | Wt 127.8 lb

## 2022-04-16 DIAGNOSIS — E785 Hyperlipidemia, unspecified: Secondary | ICD-10-CM

## 2022-04-16 DIAGNOSIS — I10 Essential (primary) hypertension: Secondary | ICD-10-CM | POA: Diagnosis not present

## 2022-04-16 DIAGNOSIS — Z79899 Other long term (current) drug therapy: Secondary | ICD-10-CM | POA: Diagnosis not present

## 2022-04-16 DIAGNOSIS — I2510 Atherosclerotic heart disease of native coronary artery without angina pectoris: Secondary | ICD-10-CM

## 2022-04-16 DIAGNOSIS — I513 Intracardiac thrombosis, not elsewhere classified: Secondary | ICD-10-CM

## 2022-04-16 DIAGNOSIS — I5022 Chronic systolic (congestive) heart failure: Secondary | ICD-10-CM | POA: Diagnosis not present

## 2022-04-16 MED ORDER — FUROSEMIDE 80 MG PO TABS: 80.0000 mg | ORAL_TABLET | Freq: Every day | ORAL | 3 refills | Status: DC

## 2022-04-16 NOTE — Patient Instructions (Signed)
Medication Instructions:  Your physician has recommended you make the following change in your medication:  -Decrease Lasix to 80 mg tablets daily   Labwork: 1 month: -BMET  Testing/Procedures: None  Follow-Up: Follow up with Dr. [name] in 3-4 months in the [place] office.   Any Other Special Instructions Will Be Listed Below (If Applicable).     If you need a refill on your cardiac medications before your next appointment, please call your pharmacy.

## 2022-04-16 NOTE — Progress Notes (Signed)
Cardiology Office Note    Date:  04/16/2022   ID:  RIVA SESMA, DOB 09/04/1940, MRN [id number]  PCP:  Richardean Chimera, MD  Cardiologist: [name], MD    Chief Complaint  Patient presents with   Follow-up    3 month visit    History of Present Illness:    ALEANE WESENBERG is a 81 y.o. female with past medical history of CAD (s/p CABG in 2006, s/p NSTEMI in 11/2021 with cath showing patent LIMA-LAD and occlusion of all vein grafts --> underwent DES to LM and LCx and staged DES to SVG-D1), HFrEF (EF 25-30% by echo in 11/2021), LV thrombus, HTN, HLD and COPD who presents to the office today for 21-month follow-up.   She was last examined by myself in 11/2021 following a recent admission for generalized weakness and a mild CHF exacerbation. She reported still feeling weak at the time of her office visit and was planning to start PT. Was walking for 10 to 15 minutes at a time without anginal symptoms and her weight had been stable at 121 - 122 lbs on her home scales. She was continued on Bisoprolol, Lasix and Imdur for her cardiomyopathy as BP did not previously allow for ACE-I/ARB/ARNI/Spiro and she was no longer on SGLT2 inhibitor therapy given recent cystitis. She was on Eliquis given her LV thrombus and was recommended plan for repeat limited echocardiogram in 3 months for reassessment of her thrombus and EF.   She did have a repeat limited echocardiogram on 03/14/2022 and this showed that her ejection fraction had improved to 55 to 60%. She did have loosely organized thrombotic material and slow flow at the LV apex but no clearly formed mural thrombus. RV function was normal.  Her echocardiogram findings were reviewed with Dr. [name] and he recommended continuing Eliquis at that time. She did call the office earlier this month reporting significant bruising along her legs and Dr. [name] recommended stopping Eliquis at that time and continuing Plavix.  In talking with the  patient today, she reports overall doing well since her last office visit. She does have arthritis which is most significant along her right knee and limits her activity. Reports swelling along her knee and Lasix was titrated to 120 mg daily by her PCP over 2 months ago.  Most recent labs in 02/2022 showed her creatinine had trended up to 1.19 with this. She denies any specific orthopnea, PND or dyspnea on exertion. No recent exertional chest pain or palpitations. Reports her bruising did significantly improve following discontinuation of Eliquis.   Past Medical History:  Diagnosis Date   Allergic rhinitis    Anxiety    Arthritis    Asthma    Bladder spasms    Carotid artery disease (HCC)    Collagen vascular disease (HCC)    COPD (chronic obstructive pulmonary disease) (HCC)    Cystocele    Essential hypertension    GERD (gastroesophageal reflux disease)    Hyperlipidemia    Hypothyroidism    Ischemic cardiomyopathy 12/02/2021   Echo 11/27/2021:     Multiple vessel coronary artery disease => CABG (LIMA-dLAD, SVG-Diag, Seq SVG-OM-OM-PDA)    a. s/p CABG in 2006 b. NSTEMI in 11/2021 with DES to LM and LCx and repeat cath later in admission with DES to SVG-D1   NSTEMI (non-ST elevated myocardial infarction) (HCC) 11/22/2021   Osteoporosis    Renal insufficiency    Vaginal vault prolapse     Past Surgical History:  Procedure Laterality Date   ANTERIOR AND POSTERIOR REPAIR N/A 04/06/2013   Procedure: CYSTOCELE REPAIR ;  Surgeon: Reece Packer, MD;  Location: [facility];  Service: Urology;  Laterality: N/A;   CATARACTS     REMOVED   COLONOSCOPY     CORNEA LACERATION REPAIR     CORONARY ARTERY BYPASS GRAFT     X 5 VESSELS   CORONARY STENT INTERVENTION N/A 11/23/2021   Procedure: CORONARY STENT INTERVENTION;  Surgeon: Sherren Mocha, MD;  Location: [facility];  Service: Cardiovascular;  Laterality: N/A;   CORONARY/GRAFT ACUTE MI REVASCULARIZATION N/A 11/24/2021   Procedure:  Coronary/Graft Acute MI Revascularization;  Surgeon: Lorretta Harp, MD;  Location: [facility];  Service: Cardiovascular;  Laterality: N/A;   CYSTOSCOPY N/A 04/06/2013   Procedure: CYSTOSCOPY;  Surgeon: Reece Packer, MD;  Location: [facility];  Service: Urology;  Laterality: N/A;   HERNIA REPAIR     LEFT HEART CATH AND CORONARY ANGIOGRAPHY N/A 11/24/2021   Procedure: LEFT HEART CATH AND CORONARY ANGIOGRAPHY;  Surgeon: Lorretta Harp, MD;  Location: [facility];  Service: Cardiovascular;  Laterality: N/A;   LEFT HEART CATH AND CORS/GRAFTS ANGIOGRAPHY N/A 11/23/2021   Procedure: LEFT HEART CATH AND CORS/GRAFTS ANGIOGRAPHY;  Surgeon: Sherren Mocha, MD;  Location: [facility];  Service: Cardiovascular;  Laterality: N/A;   SEPTOPLASTY N/A 04/28/2015   Procedure: SEPTOPLASTY;  Surgeon: Ruby Cola, MD;  Location: [facility];  Service: ENT;  Laterality: N/A;   SINUS ENDO W/FUSION Bilateral 04/28/2015   Procedure: ENDOSCOPIC SINUS SURGERY WITH NAVIGATION;  Surgeon: Ruby Cola, MD;  Location: [facility];  Service: ENT;  Laterality: Bilateral;   TUBAL LIGATION      Current Medications: Outpatient Medications Prior to Visit  Medication Sig Dispense Refill   acetaminophen (TYLENOL) 650 MG CR tablet Take 650 mg by mouth in the morning.     apixaban (ELIQUIS) 2.5 MG TABS tablet Take 1 tablet (2.5 mg total) by mouth 2 (two) times daily. 60 tablet 11   atorvastatin (LIPITOR) 80 MG tablet Take 1 tablet (80 mg total) by mouth at bedtime. 30 tablet 3   bisoprolol (ZEBETA) 5 MG tablet Take 0.5 tablets (2.5 mg total) by mouth daily. 15 tablet 1   clopidogrel (PLAVIX) 75 MG tablet Take 1 tablet (75 mg total) by mouth daily. 30 tablet 0   ezetimibe (ZETIA) 10 MG tablet Take 1 tablet (10 mg total) by mouth daily. 30 tablet 11   famotidine (PEPCID) 20 MG tablet Take 40 mg by mouth at bedtime.     isosorbide mononitrate (IMDUR) 30 MG 24 hr tablet Take 1 tablet (30 mg total) by mouth daily. 90  tablet 2   levocetirizine (XYZAL) 5 MG tablet Take 5 mg by mouth every evening. Equate levocetirizine.     levothyroxine (SYNTHROID) 88 MCG tablet Take 88 mcg by mouth daily.     nitroGLYCERIN (NITROSTAT) 0.4 MG SL tablet Place 1 tablet (0.4 mg total) under the tongue every 5 (five) minutes x 3 doses as needed for chest pain (if no relief after 3rd dose, call 911 or proceed to the ED for an evaluation). 25 tablet 2   ondansetron (ZOFRAN) 4 MG tablet Take 1 tablet (4 mg total) by mouth every 6 (six) hours as needed for nausea. 20 tablet 0   pantoprazole (PROTONIX) 40 MG tablet Take 40 mg by mouth daily.     potassium chloride (KLOR-CON M) 10 MEQ tablet Take 1 tablet (  10 mEq total) by mouth daily. (Patient taking differently: Take 20 mEq by mouth daily.) 120 tablet 3   tamsulosin (FLOMAX) 0.4 MG CAPS capsule Take 0.4 mg by mouth.     furosemide (LASIX) 40 MG tablet Take 1 tablet (40 mg total) by mouth daily. (Patient taking differently: Take 60 mg by mouth daily.) 30 tablet 1   No facility-administered medications prior to visit.     Allergies:   Ivp dye [iodinated contrast media], Betadine [povidone iodine], Codeine, Other, Penicillins, and Sulfa antibiotics   Social History   Socioeconomic History   Marital status: Legally Separated    Spouse name: Not on file   Number of children: 5   Years of education: Not on file   Highest education level: Not on file  Occupational History   Occupation: Retired  Tobacco Use   Smoking status: Never   Smokeless tobacco: Never  Vaping Use   Vaping Use: Never used  Substance and Sexual Activity   Alcohol use: Yes    Alcohol/week: 0.0 standard drinks of alcohol    Comment: 1 drink 1 time a year   Drug use: No   Sexual activity: Not on file  Other Topics Concern   Not on file  Social History Narrative   Not on file   Social Determinants of Health   Financial Resource Strain: Low Risk  (02/20/2022)   Overall Financial Resource Strain (CARDIA)     Difficulty of Paying Living Expenses: Not very hard  Recent Concern: Financial Resource Strain - Medium Risk (01/18/2022)   Overall Financial Resource Strain (CARDIA)    Difficulty of Paying Living Expenses: Somewhat hard  Food Insecurity: No Food Insecurity (01/18/2022)   Hunger Vital Sign    Worried About Running Out of Food in the Last Year: Never true    Ran Out of Food in the Last Year: Never true  Transportation Needs: No Transportation Needs (03/25/2022)   PRAPARE - Hydrologist (Medical): No    Lack of Transportation (Non-Medical): No  Physical Activity: Unknown (12/31/2018)   Exercise Vital Sign    Days of Exercise per Week: Patient refused    Minutes of Exercise per Session: Patient refused  Stress: Unknown (12/31/2018)   [place]    Feeling of Stress : Patient refused  Social Connections: Unknown (12/31/2018)   Social Connection and Isolation Panel [NHANES]    Frequency of Communication with Friends and Family: Patient refused    Frequency of Social Gatherings with Friends and Family: Patient refused    Attends Religious Services: Patient refused    Marine scientist or Organizations: Patient refused    Attends Music therapist: Patient refused    Marital Status: Patient refused     Family History:  The patient's family history includes Cirrhosis in her father; Diabetes Mellitus II in her mother; Heart Problems in her mother; Lung disease in her father.   Review of Systems:    Please see the history of present illness.     All other systems reviewed and are otherwise negative except as noted above.   Physical Exam:    VS:  BP 112/60   Pulse 64   Ht 5\' 3"  (1.6 m)   Wt 127 lb 12.8 oz (58 kg)   SpO2 95%   BMI 22.64 kg/m    General: Well developed, well nourished,female appearing in no acute distress. Head: Normocephalic, atraumatic. Neck: No  carotid  bruits. JVD not elevated.  Lungs: Respirations regular and unlabored, without wheezes or rales.  Heart: Regular rate and rhythm. No S3 or S4.  No murmur, no rubs, or gallops appreciated. Abdomen: Appears non-distended. No obvious abdominal masses. Msk:  Strength and tone appear normal for age.  Knee brace in place along right leg. Extremities: No clubbing or cyanosis. No pitting edema.  Distal pedal pulses are 2+ bilaterally. Neuro: Alert and oriented X 3. Moves all extremities spontaneously. No focal deficits noted. Psych:  Responds to questions appropriately with a normal affect. Skin: No rashes or lesions noted  Wt Readings from Last 3 Encounters:  04/16/22 127 lb 12.8 oz (58 kg)  12/12/21 128 lb 9.6 oz (58.3 kg)  12/07/21 131 lb 9.8 oz (59.7 kg)     Studies/Labs Reviewed:   EKG:  EKG is not ordered today.  Recent Labs: 11/22/2021: ALT 16 12/05/2021: TSH 8.232 12/06/2021: Hemoglobin 8.2; Platelets 520 12/07/2021: B Natriuretic Peptide 212.0; BUN 9; Creatinine, Ser 0.85; Magnesium 2.0; Potassium 3.5; Sodium 139   Lipid Panel    Component Value Date/Time   CHOL 123 12/06/2021 0527   TRIG 125 12/06/2021 0527   HDL 31 (L) 12/06/2021 0527   CHOLHDL 4.0 12/06/2021 0527   VLDL 25 12/06/2021 0527   LDLCALC 67 12/06/2021 0527    Additional studies/ records that were reviewed today include:   Cardiac Catheterization: 11/23/2021 1. Severe native vessel coronary artery disease with severe ostial left main stenosis, total occlusion of the LAD, severe stenosis in the distal left main/circumflex junction, severe diffuse stenosis of the distal AV circumflex, and a diffusely diseased nondominant RCA 2. Status post aortocoronary bypass surgery with continued patency of the LIMA to LAD and occlusion of the saphenous vein grafts 3. Successful PCI of the native left main and native circumflex using a 4.0 x 24 mm Synergy DES  Recommendation: Dual antiplatelet therapy with aspirin and ticagrelor  at least 12 months, aggressive risk reduction measures, suspect the patient occluded a vein graft causing her acute coronary syndrome. There were no vein graft targets for PCI. Her native left main into the circumflex is treated as this large territory is now potentially ischemic after occlusion of the sequential saphenous vein graft to OM and PLA.   PCI: 11/24/2021   Dist LM to Prox LAD lesion is 100% stenosed.   Ost LM to Dist LM lesion is 25% stenosed.   Dist Cx lesion is 50% stenosed.   Origin to Prox Graft lesion is 99% stenosed.   Previously placed Ost Cx to Prox Cx stent of unknown type is  widely patent.   A drug-eluting stent was successfully placed using a STENT ONYX FRONTIER 3.0X18.   Post intervention, there is a 0% residual stenosis. IMPRESSION: Widely patent left main into the circumflex stent performed yesterday by Dr. Burt Knack.  Atretic appearing LIMA to a small distal LAD.  Patent sequential vein to an OM and PDA with high-grade proximal SVG to diagonal vein graft stenosis probably representing the "culprit lesion with an excellent angiographic result after PCI drug-eluting stenting.  Hopefully her anterior wall stunned and will come back.  The sheath will be removed once ACT falls below 170 pressure held.  I am going to restart heparin 4 hours after sheath removal for 24 hours.  Given her elevated LVEDP, she probably should be quire some diuresis as well.  She left the lab in stable condition.    Limited Echo: 02/2022 IMPRESSIONS  1. Limited study.   2. Left ventricular ejection fraction, by estimation, is 55 to 60%. The  left ventricle has normal function. The left ventricle demonstrates  regional wall motion abnormalities (see scoring diagram/findings for  description).   3. Definity contrast defines loosely organized thormbotic material and  slow flow at LV apex, but no clearly formed mural thrombus.   4. Right ventricular systolic function is normal. The right  ventricular  size is normal.   5. The aortic valve is tricuspid. Aortic valve regurgitation is not  visualized.   Assessment:    1. Coronary artery disease involving native coronary artery of native heart without angina pectoris   2. Heart failure with improved ejection fraction (HFimpEF) ([place])   3. Medication management   4. LV (left ventricular) mural thrombus   5. Essential hypertension   6. Hyperlipidemia LDL goal <70      Plan:   In order of problems listed above:  1. CAD - She is s/p CABG in 2006 and underwent DES to LM and LCx and staged DES to SVG-D1 in 11/2021 with details as outlined above.  - She denies any recent chest pain or dyspnea on exertion. - Continue current medical therapy with Plavix 75 mg daily, Atorvastatin 80 mg daily, Zetia 10 mg daily, Bisoprolol 2.5mg  daily and Imdur 30 mg daily.  2. HFimpEF - Her EF was at 25-30% by echo in 11/2021, normalized to 55-60% by repeat imaging in 02/2022. She denies any recent orthopnea, PND or pitting edema and appears euvolemic by examination today. She was previously on Lasix 40 mg daily but this was titrated to 120 mg daily by her PCP in the interim due to swelling along her right knee. Given her rising creatinine by most recent labs, I did recommend reducing this to 80 mg daily and obtaining a follow-up BMET later this month. Continue Bisoprolol 2.5 mg daily and Imdur 30 mg daily. BP previously limited the use of ACE-I/ARB/ARNI/Spiro and SGLT2 inhibitor therapy was previously discontinued as this was felt to cause cystitis during an admission.  3. LV Thrombus - Diagnosed in 11/2021 and echo in 02/2022 showed loosely organized thrombotic material and slow flow at the LV apex but no clearly formed mural thrombus. As discussed above, Eliquis was discontinued earlier this month due to progressive bruising and she reports symptoms have improved since this was stopped.  Addendum: 04/18/2022: Reviewed with Dr. Domenic Polite and would  keep off Eliquis for now and plan for a repeat limited echo with Definity in 6 months. Can be scheduled at the time of her follow-up visit in 06/2022.  4. HTN - BP is well-controlled at 112/60 during today's visit. Continue current medical therapy with Bisoprolol 2.5 mg daily and Imdur 30mg  daily.  5. HLD - LDL was at 67 in 11/2021. Continue current medical therapy with Atorvastatin 80 mg daily and Zetia 10 mg daily.   Medication Adjustments/Labs and Tests Ordered: Current medicines are reviewed at length with the patient today.  Concerns regarding medicines are outlined above.  Medication changes, Labs and Tests ordered today are listed in the Patient Instructions below. Patient Instructions  Medication Instructions:  Your physician has recommended you make the following change in your medication:  -Decrease Lasix to 80 mg tablets daily   Labwork: 1 month: -BMET  Testing/Procedures: None  Follow-Up: Follow up with Dr. Domenic Polite in 3-4 months in the [place] office.   Any Other Special Instructions Will Be Listed Below (If Applicable).     If  you need a refill on your cardiac medications before your next appointment, please call your pharmacy.    Signed, Erma Heritage, PA-C  04/16/2022 5:28 PM    [facility] [address] [place], [place] [address] Phone: [phone] Fax: [phone]

## 2022-04-17 ENCOUNTER — Telehealth: Payer: Self-pay | Admitting: Student

## 2022-04-17 MED ORDER — FUROSEMIDE 80 MG PO TABS: 80.0000 mg | ORAL_TABLET | Freq: Every day | ORAL | 3 refills | Status: DC

## 2022-04-17 NOTE — Telephone Encounter (Signed)
Pt c/o medication issue:  1. Name of Medication: furosemide (LASIX) 80 MG tablet   2. How are you currently taking this medication (dosage and times per day)?   3. Are you having a reaction (difficulty breathing--STAT)?   4. What is your medication issue? Script was sent to [facility] patient wants script sent to [facility].

## 2022-04-17 NOTE — Telephone Encounter (Signed)
Completed. Patient notified

## 2022-04-18 DIAGNOSIS — K2100 Gastro-esophageal reflux disease with esophagitis, without bleeding: Secondary | ICD-10-CM | POA: Diagnosis not present

## 2022-04-18 DIAGNOSIS — R030 Elevated blood-pressure reading, without diagnosis of hypertension: Secondary | ICD-10-CM | POA: Diagnosis not present

## 2022-04-18 DIAGNOSIS — J019 Acute sinusitis, unspecified: Secondary | ICD-10-CM | POA: Diagnosis not present

## 2022-04-18 DIAGNOSIS — Z6823 Body mass index (BMI) 23.0-23.9, adult: Secondary | ICD-10-CM | POA: Diagnosis not present

## 2022-04-18 DIAGNOSIS — I10 Essential (primary) hypertension: Secondary | ICD-10-CM | POA: Diagnosis not present

## 2022-04-24 ENCOUNTER — Ambulatory Visit (HOSPITAL_COMMUNITY): Admission: RE | Admit: 2022-04-24 | Payer: Medicare HMO | Source: Ambulatory Visit

## 2022-04-24 ENCOUNTER — Ambulatory Visit: Payer: Self-pay | Admitting: *Deleted

## 2022-04-24 ENCOUNTER — Encounter: Payer: Self-pay | Admitting: *Deleted

## 2022-04-24 ENCOUNTER — Encounter (HOSPITAL_COMMUNITY): Payer: Self-pay

## 2022-04-26 ENCOUNTER — Other Ambulatory Visit: Payer: Self-pay | Admitting: Student

## 2022-04-26 NOTE — Patient Outreach (Signed)
Care Coordination   Follow Up Visit Note   04/25/2022 Name: Debbie [name] MRN: [id number] DOB: 12-22-1940  Debbie [name] is a 81 y.o. year old female who sees Debbie Chimera, MD for primary care. I spoke with  Debbie [name] by phone today.  What matters to the patients health and wellness today?  Managing edema and medications    Goals Addressed             This Visit's Progress    Manage Lower Extremity Edema       Care Coordination Interventions: Evaluation of current treatment plan related to lower extremity edema and patient's adherence to plan as established by provider Advised patient to follow a low sodium DASH diet Reviewed medications with patient and discussed use of lasix to reduce swelling. Discussed plans with patient for ongoing care management follow up and provided patient with direct contact information for care management team Assessed social determinant of health barriers Discussed lower extremity edema.  Encouraged to continue propping her legs up when sitting sitting and to wear compression hose/socks. Verified that she has purchased compression socks but only wears them when she goes out.  Advised to weigh and record weights each morning after urinating and to call cardiologist with any weight gain of >2 lbs overnight or >5 lbs in one week Reviewed and discussed recent cardiology visit Extensively reviewed medications that we have on file in her medical record and compared to what she has at home. She is working with Debbie [name], PharmD [facility] to manage her meds. Debbie [name] is packaging them for her monthly for now until she can get all of the refills coordinated and have them prepackaged and mailed by [facility].  Taking three lasix 40mg  tablets each morning. Medical record says to take 80mg  each morning. Previous rx was for 40mg  TID. Asked Debbie [name] to verify dose and directions.  Also has protonix and levothyroxine on her med list and it was sent  to [facility] pharmacy in Nov. Patient doesn't have these at home. She is taking famotidine. Asked Debbie [name] to verify if she should be taking protonix and famotidine and to assist with obtaining protonix if so and levothyroxine. Verified that she is not taking ASA and is taking plavix Sent Debbie [name] a secure email about some inconsistencies in the meds she has at home vs what we have listed Sent a secure staff message to , [facility] (cardio) regarding Eliquis. Office notes say to d/c and patient does not have this at home, but it is still listed on her medical record. I have marked for removal and Dec will review. Verified that patient is able to drive to appointments in [place] Referral to [facility] guides for assistance with transportation outside of the county. Assistance provided.  Provided with RNCC contact number and encouraged to reach out as needed           SDOH assessments and interventions completed:  Yes  SDOH Interventions Today    Flowsheet Row Most Recent Value  SDOH Interventions   Food Insecurity Interventions Intervention Not Indicated  Housing Interventions Intervention Not Indicated  Transportation Interventions Intervention Not Indicated  Utilities Interventions Intervention Not Indicated  Financial Strain Interventions Intervention Not Indicated  Physical Activity Interventions Other (Comments)  [due to knee pain/osteoarthritis]        Care Coordination Interventions:  Yes, provided   Follow up plan: Follow up call scheduled for 05/08/22 Outreach to Debbie [name], PharmD and [facility], 05/10/22 (cardio)  Encounter Outcome:  Pt. Visit Completed   Debbie [name], BSN, RN-BC RN Care Coordinator [place] Direct Dial: [phone] Main #: [phone]

## 2022-05-08 ENCOUNTER — Ambulatory Visit: Payer: Self-pay | Admitting: *Deleted

## 2022-05-08 NOTE — Patient Outreach (Signed)
  Care Coordination   05/08/2022 Name: Debbie [name] MRN: [id number] DOB: 25-Apr-1941   Care Coordination Outreach Attempts:  An unsuccessful telephone outreach was attempted for a scheduled appointment today.  Follow Up Plan:  Additional outreach attempts will be made to offer the patient care coordination information and services.   Encounter Outcome:  No Answer   Care Coordination Interventions:  Yes, provided    Communicated with Steve Rattler, PharmD with [facility] regarding medications. She has been working to straighten out Debbie [name]'s medicines, but it's been difficult because Debbie [name] continues to pick-up meds at the local pharmacy even though Debbie [name] is attempting to have them prepackaged and mailed through [facility]. For now she is packaging them for her monthly until they can get mail order sorted out and she may go out to Debbie [name]'s home to get a better idea of the situation since she has meds sitting in different rooms of her house. Also talked with Randall An, PA-C (cardiology) regarding Eliquis and furosemide. Verified that Eliquis was discontinued and she went in and removed it from her chart. Verified that furosemide is supposed to be 80 mg each morning and 120mg , due to renal function. Dawn verified that was the information she had as well. Pt was taking three 40mg  tablets each morning.   , BSN, RN-BC RN Care Coordinator [facility]  [facility] Direct Dial: [phone] Main #: [phone]

## 2022-05-19 DIAGNOSIS — K2100 Gastro-esophageal reflux disease with esophagitis, without bleeding: Secondary | ICD-10-CM | POA: Diagnosis not present

## 2022-05-19 DIAGNOSIS — I10 Essential (primary) hypertension: Secondary | ICD-10-CM | POA: Diagnosis not present

## 2022-05-29 ENCOUNTER — Ambulatory Visit: Payer: Self-pay | Admitting: *Deleted

## 2022-05-29 DIAGNOSIS — I5020 Unspecified systolic (congestive) heart failure: Secondary | ICD-10-CM | POA: Diagnosis not present

## 2022-05-29 DIAGNOSIS — R5383 Other fatigue: Secondary | ICD-10-CM | POA: Diagnosis not present

## 2022-05-29 DIAGNOSIS — Z6823 Body mass index (BMI) 23.0-23.9, adult: Secondary | ICD-10-CM | POA: Diagnosis not present

## 2022-05-29 DIAGNOSIS — N1831 Chronic kidney disease, stage 3a: Secondary | ICD-10-CM | POA: Diagnosis not present

## 2022-05-29 DIAGNOSIS — J449 Chronic obstructive pulmonary disease, unspecified: Secondary | ICD-10-CM | POA: Diagnosis not present

## 2022-05-29 DIAGNOSIS — R030 Elevated blood-pressure reading, without diagnosis of hypertension: Secondary | ICD-10-CM | POA: Diagnosis not present

## 2022-05-29 NOTE — Patient Outreach (Signed)
  Care Coordination   05/29/2022 Name: Debbie [name] MRN: [id number] DOB: Sep 05, 1940   Care Coordination Outreach Attempts:  An unsuccessful telephone outreach was attempted for a scheduled appointment today. #2  Follow Up Plan:  Additional outreach attempts will be made to offer the patient care coordination information and services.   Encounter Outcome:  No Answer   Care Coordination Interventions:  No, not indicated    Chong Sicilian, BSN, RN-BC RN Care Coordinator [place]: [phone] Main #: [phone]

## 2022-05-30 ENCOUNTER — Telehealth: Payer: Self-pay | Admitting: *Deleted

## 2022-05-30 NOTE — Progress Notes (Signed)
  Care Coordination Note  05/30/2022 Name: Debbie [name] MRN: [id number] DOB: 08/24/1940  Debbie [name] is a 82 y.o. year old female who is a primary care patient of Caryl Bis, MD and is actively engaged with the care management team. I reached out to Chriss Czar by phone today to assist with re-scheduling a follow up visit with the RN Case Manager  Follow up plan: Unsuccessful telephone outreach attempt made. A HIPAA compliant phone message was left for the patient providing contact information and requesting a return call.   [place]  Direct Dial: [phone]

## 2022-06-04 NOTE — Progress Notes (Signed)
  Care Coordination Note  06/04/2022 Name: Debbie [name] MRN: [id number] DOB: 11/23/1940  Debbie [name] is a 82 y.o. year old female who is a primary care patient of Caryl Bis, MD and is actively engaged with the care management team. I reached out to Chriss Czar by phone today to assist with re-scheduling a follow up visit with the RN Case Manager  Follow up plan: Telephone appointment with care management team member scheduled for:06/05/22  [place]: [phone]

## 2022-06-05 ENCOUNTER — Ambulatory Visit: Payer: Self-pay | Admitting: *Deleted

## 2022-06-07 ENCOUNTER — Ambulatory Visit: Payer: Self-pay | Admitting: *Deleted

## 2022-06-07 NOTE — Patient Outreach (Signed)
  Care Coordination   06/07/2022 Name: Debbie [name] MRN: [id number] DOB: [birth year]   Care Coordination Outreach Attempts:  An unsuccessful telephone outreach was attempted for a scheduled appointment today.  Follow Up Plan:  Additional outreach attempts will be made to offer the patient care coordination information and services.   Encounter Outcome:  No Answer   Care Coordination Interventions:  Yes, provided    Reached out to Deanne Coffer, PharmD with PCP office regarding lasix dose. Cardiology had decreased to 80mg  a day due to abnormal renal functions and PCP increased back to 120mg  a day due to lower extremity edema. Taking 80mg  in the morning and 40mg  in the afternoon. Would like guidance from [facility] or cardio on this.  Chong Sicilian, BSN, RN-BC RN Care Coordinator [place] Direct Dial: [phone] Main #: [phone]

## 2022-06-12 ENCOUNTER — Encounter: Payer: Self-pay | Admitting: *Deleted

## 2022-06-12 ENCOUNTER — Ambulatory Visit: Payer: Self-pay | Admitting: *Deleted

## 2022-06-12 NOTE — Patient Outreach (Signed)
  Care Coordination   Follow Up Visit Note   06/05/2022 Name: Debbie [name] MRN: [id number] DOB: [birth year]  ANNETTA DEISS is a 82 y.o. year old female who sees Caryl Bis, MD for primary care. I spoke with  Chriss Czar by phone today.  What matters to the patients health and wellness today?  Lasix dose adjustment    Goals Addressed             This Visit's Progress    Manage Lower Extremity Edema       Care Coordination Interventions: Evaluation of current treatment plan related to lower extremity edema and patient's adherence to plan as established by provider Advised patient to follow a low sodium DASH diet Reviewed medications with patient and discussed use of lasix to reduce swelling. Discussed plans with patient for ongoing care management follow up and provided patient with direct contact information for care management team Assessed social determinant of health barriers Discussed lower extremity edema.  Encouraged to continue propping her legs up when sitting sitting and to wear compression hose/socks. Verified that she has purchased compression socks but only wears them when she goes out.  Advised to weigh and record weights each morning after urinating and to call cardiologist with any weight gain of >2 lbs overnight or >5 lbs in one week Discussed recent PCP visit  increased of lasix from 80mg  back to 120mg  daily. She is taking 40mg  in the morning and 80 in the afternoon  This was previously decreased by cardio to due abn kidney functions Collaborated with Deanne Coffer, PharmD ([facility]) with PCP office via secure email. She advised that her serum creatinine and eGFR improved and that 120mg  dose would be ok to take Provided with [facility] contact number and encouraged to reach out as needed           SDOH assessments and interventions completed:  No     Care Coordination Interventions:  Yes, provided   Follow up plan: Follow up call scheduled  for 06/07/2022    Encounter Outcome:  Pt. Visit Completed   Chong Sicilian, BSN, RN-BC RN Care Coordinator [place]: [phone] Main #: [phone]

## 2022-06-12 NOTE — Patient Outreach (Signed)
  Care Coordination   Follow Up Visit Note   06/12/2022 Name: Debbie [name] MRN: [id number] DOB: June 20, 1940  Debbie [name] is a 82 y.o. year old female who sees Debbie Bis, MD for primary care. I spoke with  Debbie [name] by phone today.  What matters to the patients health and wellness today?  Managing Edema    Goals Addressed             This Visit's Progress    Manage Lower Extremity Edema       Care Coordination Interventions: Evaluation of current treatment plan related to lower extremity edema and patient's adherence to plan as established by provider Advised patient to follow a low sodium DASH diet Reviewed medications with patient and discussed use of lasix to reduce swelling. Discussed plans with patient for ongoing care management follow up and provided patient with direct contact information for care management team Reminded to weigh and record weights each morning after urinating and to call cardiologist with any weight gain of >2 lbs overnight or >5 lbs in one week Advised patient that I consulted with Deanne Coffer, PharmD regarding her lasix dose and that she felt 120mg  is appropriate since her kidney function levels had improved at last PCP visit Reviewed medicaitons and discussed management. Patient is receiving prepackaged meds from [facility] but the increased lasix dose is not in the package yet. She is taking it appropriately from the bottle.  Cognition and recall have been normal the past two telephone visits. She seems to understand her medications and treatment plan. During her initial visit with me, she was more disorganized and confused, but since UTI resolution, she is back to normal.  She had planned to drive a couple of hours into [place] to visit family last Friday but was unable to go because their family had covid. She is up-to-date on her vaccines and boosters and takes precautions to prevent covid infection. So far, she hasn't contracted it.   Provided with [facility] contact number and encouraged to reach out as needed           SDOH assessments and interventions completed:  Yes  SDOH Interventions Today    Flowsheet Row Most Recent Value  SDOH Interventions   Transportation Interventions Intervention Not Indicated  Physical Activity Interventions Other (Comments)  [due to knee pain]        Care Coordination Interventions:  Yes, provided   Follow up plan: Follow up call scheduled for 07/15/22    Encounter Outcome:  Pt. Visit Completed   Chong Sicilian, BSN, RN-BC RN Care Coordinator [place]: [phone] Main #: [phone]

## 2022-07-15 ENCOUNTER — Encounter: Payer: Self-pay | Admitting: *Deleted

## 2022-07-15 ENCOUNTER — Ambulatory Visit: Payer: Self-pay | Admitting: *Deleted

## 2022-07-15 NOTE — Patient Outreach (Signed)
  Care Coordination   Follow Up Visit Note   07/15/2022 Name: Debbie [name] MRN: [id number] DOB: [birth year]  ARDES GALBAN is a 82 y.o. year old female who sees Caryl Bis, MD for primary care. I spoke with  Chriss Czar by phone today.  What matters to the patients health and wellness today?  Managing lower extremity edema and acute UTI symptoms    Goals Addressed             This Visit's Progress    Manage Lower Extremity Edema   On track    Care Coordination Goals: Patient will follow a low sodium/DASH Diet Patient will weigh and record weight each morning after urinating and call cardiologist with any weight gain of >2 lbs overnight or >5 lbs in one week Patient will take prepackaged medication as prescribed and will follow-up with Deanne Coffer, PharmD ([facility]) with any questions or concerns regarding prepackaged medications Patient will f/u with PCP as directed  Patient will keep appointment with cardiologist for 07/18/22 Patient will reach out to [facility] at [phone] with any care coordination or resource needs        Manage UTI Symptoms       CARE COORDINATION GOAL: Patient will call PCP today regarding dark urine, back pain, and suspected UTI (urine is more clear now and pain has improved since she has taken 3 days of leftover Keflex) Patient will take all antibiotics as prescribed and will not have any leftover unless provider instructs her to stop taking it Patient will not take leftover antibiotics without consulting with provider first Patient will report UTI symptoms to PCP or seek medical evaluation with the onset of UTI symptoms. (She has a history of confusion resulting in ED visit due to untreated UTI)        SDOH assessments and interventions completed:  Yes  SDOH Interventions Today    Flowsheet Row Most Recent Value  SDOH Interventions   Transportation Interventions Intervention Not Indicated  Financial Strain Interventions  Intervention Not Indicated       Care Coordination Interventions:  Yes, provided  Interventions Today    Flowsheet Row Most Recent Value  Chronic Disease   Chronic disease during today's visit Congestive Heart Failure (CHF), Other  [UTI Symptoms]  General Interventions   General Interventions Discussed/Reviewed General Interventions Discussed, General Interventions Reviewed, Doctor Visits  Doctor Visits Discussed/Reviewed Specialist, PCP, Doctor Visits Reviewed, Doctor Visits Discussed  [follow-up with PCP today regarding UTI symptoms]  PCP/Specialist Visits Compliance with follow-up visit  Education Interventions   Education Provided Provided Education  Provided Verbal Education On When to see the doctor, Medication  [Importance of taking all antibiotics as prescribed & local urgent care locations]  Nutrition Interventions   Nutrition Discussed/Reviewed Decreasing salt, Fluid intake  Pharmacy Interventions   Pharmacy Dicussed/Reviewed Pharmacy Topics Discussed, Medications and their functions       Follow up plan: Follow up call scheduled for 07/30/22    Encounter Outcome:  Pt. Visit Completed   Chong Sicilian, BSN, RN-BC RN Care Coordinator [place]: [phone] Main #: [phone]

## 2022-07-18 ENCOUNTER — Ambulatory Visit: Payer: Medicare HMO | Attending: Nurse Practitioner | Admitting: Nurse Practitioner

## 2022-07-19 DIAGNOSIS — Z6823 Body mass index (BMI) 23.0-23.9, adult: Secondary | ICD-10-CM | POA: Diagnosis not present

## 2022-07-19 DIAGNOSIS — R030 Elevated blood-pressure reading, without diagnosis of hypertension: Secondary | ICD-10-CM | POA: Diagnosis not present

## 2022-07-19 DIAGNOSIS — R300 Dysuria: Secondary | ICD-10-CM | POA: Diagnosis not present

## 2022-07-19 DIAGNOSIS — M1711 Unilateral primary osteoarthritis, right knee: Secondary | ICD-10-CM | POA: Diagnosis not present

## 2022-07-30 ENCOUNTER — Ambulatory Visit: Payer: Self-pay | Admitting: *Deleted

## 2022-07-30 ENCOUNTER — Encounter: Payer: Self-pay | Admitting: *Deleted

## 2022-08-01 NOTE — Patient Outreach (Signed)
Care Coordination   Follow Up Visit Note   07/30/2022 Name: Debbie [name] MRN: [id number] DOB: 11/02/1940  Debbie [name] is a 82 y.o. year old female who sees Caryl Bis, MD for primary care. I spoke with  Chriss Czar by phone today.  What matters to the patients health and wellness today?  Managing edema and blood pressure    Goals Addressed             This Visit's Progress    Manage Blood Pressure       Care Coordination Goals: Patient will take medications as directed and report any negative side effects to provider  Patient will use a pill box/organizer to help keep up with when to take medications Patient will monitor and record blood pressure daily and as needed and will call PCP or specialist with any readings outside of recommended range Patient will keep all recommended follow-up appointments with PCP and specialists (cardiology, nephrology, etc) Patient will take blood pressure log to PCP and specialty appointments for review Patient will follow a low sodium/DASH diet  Patient will reach out to [place] Coordinator [phone] with any care coordination or resource needs       Manage Lower Extremity Edema       Care Coordination Goals: Patient will follow a low sodium/DASH Diet Patient will weigh and record weight each morning after urinating and call cardiologist with any weight gain of >2 lbs overnight or >5 lbs in one week Patient will take prepackaged medication as prescribed and will follow-up with Deanne Coffer, PharmD ([facility]) with any questions or concerns regarding prepackaged medications Patient will f/u with PCP as directed  Patient will rescheduled missed appointment with cardiologist Patient will reach out to [facility] at [phone] with any care coordination or resource needs        COMPLETED: Manage UTI Symptoms       CARE COORDINATION GOAL: Patient will call PCP today regarding dark urine, back pain, and suspected UTI (urine is  more clear now and pain has improved since she has taken 3 days of leftover Keflex) Patient will take all antibiotics as prescribed and will not have any leftover unless provider instructs her to stop taking it Patient will not take leftover antibiotics without consulting with provider first Patient will report UTI symptoms to PCP or seek medical evaluation with the onset of UTI symptoms. (She has a history of confusion resulting in ED visit due to untreated UTI)        SDOH assessments and interventions completed:  Yes  SDOH Interventions Today    Flowsheet Row Most Recent Value  SDOH Interventions   Food Insecurity Interventions Intervention Not Indicated  Transportation Interventions Intervention Not Indicated        Care Coordination Interventions:  Yes, provided  Interventions Today    Flowsheet Row Most Recent Value  Chronic Disease   Chronic disease during today's visit Congestive Heart Failure (CHF), Hypertension (HTN)  General Interventions   General Interventions Discussed/Reviewed General Interventions Discussed, General Interventions Reviewed, Communication with, Doctor Visits  Doctor Visits Discussed/Reviewed Doctor Visits Reviewed, Doctor Visits Discussed, Specialist, PCP  PCP/Specialist Visits Compliance with follow-up visit  [no showed for cardio visit last month]  Communication with PCP/Specialists  [Staff message to [place] Scheduling Dept requesting that they reach out to patient to reschedule visit]  Exercise Interventions   Exercise Discussed/Reviewed Physical Activity  Physical Activity Discussed/Reviewed Physical Activity Discussed, Physical Activity Reviewed  Education Interventions   Education  Provided Provided Education  Provided Verbal Education On When to see the doctor, Medication, Other  [monitoring blood pressure and daily weights]  Nutrition Interventions   Nutrition Discussed/Reviewed Decreasing salt, Fluid intake  Pharmacy Interventions    Pharmacy Dicussed/Reviewed Medications and their functions  Safety Interventions   Safety Discussed/Reviewed Safety Discussed       Follow up plan: Follow up call scheduled for 09/17/2022    Encounter Outcome:  Pt. Visit Completed   Chong Sicilian, BSN, RN-BC RN Care Coordinator [place]: [phone] Main #: [phone]

## 2022-08-05 ENCOUNTER — Ambulatory Visit: Payer: Medicare HMO | Admitting: Nurse Practitioner

## 2022-09-05 DIAGNOSIS — R030 Elevated blood-pressure reading, without diagnosis of hypertension: Secondary | ICD-10-CM | POA: Diagnosis not present

## 2022-09-05 DIAGNOSIS — R42 Dizziness and giddiness: Secondary | ICD-10-CM | POA: Diagnosis not present

## 2022-09-05 DIAGNOSIS — Z6823 Body mass index (BMI) 23.0-23.9, adult: Secondary | ICD-10-CM | POA: Diagnosis not present

## 2022-09-17 ENCOUNTER — Encounter: Payer: Self-pay | Admitting: *Deleted

## 2022-09-17 ENCOUNTER — Ambulatory Visit: Payer: Self-pay | Admitting: *Deleted

## 2022-09-17 DIAGNOSIS — K2100 Gastro-esophageal reflux disease with esophagitis, without bleeding: Secondary | ICD-10-CM | POA: Diagnosis not present

## 2022-09-17 DIAGNOSIS — I10 Essential (primary) hypertension: Secondary | ICD-10-CM | POA: Diagnosis not present

## 2022-09-17 NOTE — Patient Outreach (Signed)
Care Coordination   Follow Up Visit Note   09/17/2022 Name: Debbie [name] MRN: [id number] DOB: 1940/10/04  Debbie [name] is a 82 y.o. year old female who sees Richardean Chimera, MD for primary care. I spoke with  [name] by phone today.  What matters to the patients health and wellness today?  Managing blood pressure, edema, and dizziness    Goals Addressed             This Visit's Progress    Manage Blood Pressure   On track    Care Coordination Goals: Patient will take medications as directed and report any negative side effects to provider  Patient will use a pill box/organizer to help keep up with when to take medications Patient will monitor and record blood pressure daily and as needed and will call PCP or specialist with any readings outside of recommended range Patient will keep all recommended follow-up appointments with PCP and specialists (cardiology, nephrology, etc) Patient will take blood pressure log to PCP and specialty appointments for review Patient will follow a low sodium/DASH diet  Patient will reach out to RN Care Coordinator [phone] with any care coordination or resource needs       Manage Dizziness       Care Coordination Goals: Patient will take medication as prescribed Patient will follow-up with PCP as directed Patient will reach out to PCP with any new or worsening symptoms Patient will move carefully and change positions slowly to decrease risk of falls Patient will use other fall precautions Patient will use assistive devices for ambulation, especially when dizzy Patient will verbalize understanding of medication side effects Patient will reach out to RN Care Coordinator [phone] with any resource or care coordination needs     Manage Lower Extremity Edema   On track    Care Coordination Goals: Patient will follow a low sodium/DASH Diet Patient will weigh and record weight each morning after urinating and call  cardiologist with any weight gain of >2 lbs overnight or >5 lbs in one week Patient will take prepackaged medication as prescribed and will follow-up with Steve Rattler, PharmD ([facility]) with any questions or concerns regarding prepackaged medications Patient will f/u with PCP as directed  Patient will rescheduled missed appointment with cardiologist Patient will reach out to [facility] at [phone] with any care coordination or resource needs          SDOH assessments and interventions completed:  Yes  SDOH Interventions Today    Flowsheet Row Most Recent Value  SDOH Interventions   Transportation Interventions Intervention Not Indicated  Physical Activity Interventions Other (Comments)  [due to knee pain]        Care Coordination Interventions:  Yes, provided  Interventions Today    Flowsheet Row Most Recent Value  Chronic Disease   Chronic disease during today's visit Hypertension (HTN), Other  [lower extremity edema, dizziness]  General Interventions   General Interventions Discussed/Reviewed General Interventions Discussed, General Interventions Reviewed, Labs, Doctor Visits, Durable Medical Equipment (DME)  Labs Kidney Function  Doctor Visits Discussed/Reviewed Doctor Visits Discussed, Doctor Visits Reviewed, Specialist, PCP  Durable Medical Equipment (DME) Other  Gilmer Mor, scales]  PCP/Specialist Visits Compliance with follow-up visit  Exercise Interventions   Exercise Discussed/Reviewed Exercise Discussed, Exercise Reviewed, Physical Activity, Assistive device use and maintanence  Physical Activity Discussed/Reviewed Physical Activity Discussed, Physical Activity Reviewed, Home Exercise Program (HEP)  Education Interventions   Education Provided Provided Education  Provided Verbal Education On Nutrition, Mental  Health/Coping with Illness, When to see the doctor, Labs, Exercise, Medication, Other  [blood presure monitoring, daily weights, side effects of meclizine]  Nutrition  Interventions   Nutrition Discussed/Reviewed Nutrition Discussed, Nutrition Reviewed, Decreasing salt  Pharmacy Interventions   Pharmacy Dicussed/Reviewed Medications and their functions, Pharmacy Topics Discussed, Pharmacy Topics Reviewed  Safety Interventions   Safety Discussed/Reviewed Safety Discussed, Safety Reviewed, Fall Risk, Home Safety  Home Safety Assistive Devices       Follow up plan: Follow up call scheduled for 10/17/22    Encounter Outcome:  Pt. Visit Completed   Demetrios Loll, BSN, RN-BC RN Care Coordinator [facility]  [facility] Direct Dial: [phone] Main #: [phone]

## 2022-09-26 DIAGNOSIS — M25551 Pain in right hip: Secondary | ICD-10-CM | POA: Diagnosis not present

## 2022-09-26 DIAGNOSIS — M25561 Pain in right knee: Secondary | ICD-10-CM | POA: Diagnosis not present

## 2022-09-26 DIAGNOSIS — R262 Difficulty in walking, not elsewhere classified: Secondary | ICD-10-CM | POA: Diagnosis not present

## 2022-09-26 DIAGNOSIS — M6281 Muscle weakness (generalized): Secondary | ICD-10-CM | POA: Diagnosis not present

## 2022-09-30 DIAGNOSIS — M25561 Pain in right knee: Secondary | ICD-10-CM | POA: Diagnosis not present

## 2022-09-30 DIAGNOSIS — R262 Difficulty in walking, not elsewhere classified: Secondary | ICD-10-CM | POA: Diagnosis not present

## 2022-09-30 DIAGNOSIS — M25551 Pain in right hip: Secondary | ICD-10-CM | POA: Diagnosis not present

## 2022-09-30 DIAGNOSIS — M6281 Muscle weakness (generalized): Secondary | ICD-10-CM | POA: Diagnosis not present

## 2022-10-03 DIAGNOSIS — M6281 Muscle weakness (generalized): Secondary | ICD-10-CM | POA: Diagnosis not present

## 2022-10-03 DIAGNOSIS — M25561 Pain in right knee: Secondary | ICD-10-CM | POA: Diagnosis not present

## 2022-10-03 DIAGNOSIS — R262 Difficulty in walking, not elsewhere classified: Secondary | ICD-10-CM | POA: Diagnosis not present

## 2022-10-03 DIAGNOSIS — M25551 Pain in right hip: Secondary | ICD-10-CM | POA: Diagnosis not present

## 2022-10-08 DIAGNOSIS — Z6823 Body mass index (BMI) 23.0-23.9, adult: Secondary | ICD-10-CM | POA: Diagnosis not present

## 2022-10-08 DIAGNOSIS — M7061 Trochanteric bursitis, right hip: Secondary | ICD-10-CM | POA: Diagnosis not present

## 2022-10-08 DIAGNOSIS — M25551 Pain in right hip: Secondary | ICD-10-CM | POA: Diagnosis not present

## 2022-10-08 DIAGNOSIS — R030 Elevated blood-pressure reading, without diagnosis of hypertension: Secondary | ICD-10-CM | POA: Diagnosis not present

## 2022-10-16 DIAGNOSIS — R42 Dizziness and giddiness: Secondary | ICD-10-CM | POA: Diagnosis not present

## 2022-10-16 DIAGNOSIS — Z6822 Body mass index (BMI) 22.0-22.9, adult: Secondary | ICD-10-CM | POA: Diagnosis not present

## 2022-10-16 DIAGNOSIS — R030 Elevated blood-pressure reading, without diagnosis of hypertension: Secondary | ICD-10-CM | POA: Diagnosis not present

## 2022-10-17 ENCOUNTER — Ambulatory Visit: Payer: Self-pay | Admitting: *Deleted

## 2022-10-17 ENCOUNTER — Encounter: Payer: Self-pay | Admitting: *Deleted

## 2022-10-17 NOTE — Patient Outreach (Signed)
Care Coordination   Follow Up Visit Note   10/17/2022 Name: Debbie [name] MRN: [id number] DOB: Mar 15, 1941  Debbie [name] is a 82 y.o. year old female who sees Richardean Chimera, MD for primary care. I spoke with  [name] by phone today.  What matters to the patients health and wellness today?  Managing blood pressure and dizziness    Goals Addressed             This Visit's Progress    Manage Blood Pressure   On track    Care Coordination Goals: Patient will take medications as directed and report any negative side effects to provider  Patient will continue to use prepackaged medications Patient will monitor and record blood pressure daily and as needed and will call PCP or specialist with any readings outside of recommended range Patient will keep all recommended follow-up appointments with PCP and specialists (cardiology, nephrology, etc) Patient will take blood pressure log to PCP and specialty appointments for review Patient will follow a low sodium/DASH diet  Patient will reach out to RN Care Coordinator [phone] with any care coordination or resource needs       Manage Dizziness   On track    Care Coordination Goals: Patient will take medication as prescribed Patient will follow-up with PCP as directed Patient will reach out to PCP with any new or worsening symptoms Patient will keep vestibular therapy appt Referred by PCP after visit today Patient will move carefully and change positions slowly to decrease risk of falls Patient will use other fall precautions Patient will use assistive devices for ambulation, especially when dizzy Patient will verbalize understanding of medication side effects Patient will reach out to RN Care Coordinator [phone] with any resource or care coordination needs     Manage Lower Extremity Edema   On track    Care Coordination Goals: Patient will follow a low sodium/DASH Diet Patient will weigh and record  weight each morning after urinating and call cardiologist with any weight gain of >2 lbs overnight or >5 lbs in one week Patient will take prepackaged medication as prescribed and will follow-up with Steve Rattler, PharmD ([facility]) with any questions or concerns regarding prepackaged medications Patient will f/u with PCP as directed  Patient will rescheduled missed appointment with cardiologist Patient will reach out to [facility] at [phone] with any care coordination or resource needs          SDOH assessments and interventions completed:  Yes  SDOH Interventions Today    Flowsheet Row Most Recent Value  SDOH Interventions   Transportation Interventions Intervention Not Indicated  Financial Strain Interventions Intervention Not Indicated        Care Coordination Interventions:  Yes, provided  Interventions Today    Flowsheet Row Most Recent Value  Chronic Disease   Chronic disease during today's visit Hypertension (HTN), Congestive Heart Failure (CHF), Other  [Dizziness]  General Interventions   General Interventions Discussed/Reviewed General Interventions Discussed, General Interventions Reviewed, Durable Medical Equipment (DME), Doctor Visits, Labs  Labs Kidney Function  Doctor Visits Discussed/Reviewed Doctor Visits Discussed, Specialist, Doctor Visits Reviewed, PCP  [follow-up with Steve Rattler, Pharmd re: medication management]  Durable Medical Equipment (DME) BP Cuff  PCP/Specialist Visits Compliance with follow-up visit  Exercise Interventions   Exercise Discussed/Reviewed Physical Activity  Physical Activity Discussed/Reviewed Physical Activity Discussed, Physical Activity Reviewed  Education Interventions   Education Provided Provided Education  Provided Verbal Education On When to see the doctor, Mental Health/Coping  with Illness, Medication  [blood pressure monitoring, daily weights, vestibular therapy for dizziness]  Pharmacy Interventions   Pharmacy  Dicussed/Reviewed Pharmacy Topics Discussed, Pharmacy Topics Reviewed, Medications and their functions  [patient is waiting for Steve Rattler, PharmD to return call re: ASA therapy. Chart reviewed extensively and patient is on plavix only. Brilinta and ASA was prescribed last year but changed to plavix at some point.]  Safety Interventions   Safety Discussed/Reviewed Safety Discussed, Fall Risk, Safety Reviewed, Home Safety  Home Safety Assistive Devices  [Per patient, referral to therapy for dizziness placed this morning]       Follow up plan: Follow up call scheduled for 11/18/22    Encounter Outcome:  Pt. Visit Completed   Debbie [name], BSN, RN-BC RN Care Coordinator [facility]  [facility] Direct Dial: [phone] Main #: [phone]

## 2022-10-18 DIAGNOSIS — I10 Essential (primary) hypertension: Secondary | ICD-10-CM | POA: Diagnosis not present

## 2022-10-18 DIAGNOSIS — K2100 Gastro-esophageal reflux disease with esophagitis, without bleeding: Secondary | ICD-10-CM | POA: Diagnosis not present

## 2022-10-21 DIAGNOSIS — M25562 Pain in left knee: Secondary | ICD-10-CM | POA: Diagnosis not present

## 2022-10-21 DIAGNOSIS — M19011 Primary osteoarthritis, right shoulder: Secondary | ICD-10-CM | POA: Diagnosis not present

## 2022-10-21 DIAGNOSIS — R5383 Other fatigue: Secondary | ICD-10-CM | POA: Diagnosis not present

## 2022-11-13 DIAGNOSIS — R413 Other amnesia: Secondary | ICD-10-CM | POA: Diagnosis not present

## 2022-11-13 DIAGNOSIS — R8290 Unspecified abnormal findings in urine: Secondary | ICD-10-CM | POA: Diagnosis not present

## 2022-11-13 DIAGNOSIS — Z6823 Body mass index (BMI) 23.0-23.9, adult: Secondary | ICD-10-CM | POA: Diagnosis not present

## 2022-11-13 DIAGNOSIS — R030 Elevated blood-pressure reading, without diagnosis of hypertension: Secondary | ICD-10-CM | POA: Diagnosis not present

## 2022-11-18 ENCOUNTER — Ambulatory Visit: Payer: Self-pay | Admitting: *Deleted

## 2022-11-18 NOTE — Patient Outreach (Signed)
  Care Coordination   11/18/2022 Name: Debbie [name] MRN: [id number] DOB: 1940/12/28   Care Coordination Outreach Attempts:  An unsuccessful telephone outreach was attempted for a scheduled appointment today.  Follow Up Plan:  Additional outreach attempts will be made to offer the patient care coordination information and services.   Encounter Outcome:  No Answer   Care Coordination Interventions:  No, not indicated    Demetrios Loll, BSN, RN-BC RN Care Coordinator [facility]  [facility] Direct Dial: [phone] Main #: [phone]

## 2022-11-28 ENCOUNTER — Ambulatory Visit: Payer: Self-pay | Admitting: *Deleted

## 2022-11-28 ENCOUNTER — Encounter: Payer: Self-pay | Admitting: *Deleted

## 2022-11-28 NOTE — Patient Outreach (Signed)
Care Coordination   Follow Up Visit Note   11/28/2022 Name: Debbie [name] MRN: [id number] DOB: Aug 06, 1940  Debbie [name] is a 82 y.o. year old female who sees Richardean Chimera, MD for primary care. I spoke with  [name] by phone today.  What matters to the patients health and wellness today?  Managing blood pressure and improving appetite    Goals Addressed             This Visit's Progress    Manage Blood Pressure   On track    Care Coordination Goals: Patient will take medications as directed and report any negative side effects to provider  Patient will continue to use prepackaged medications Patient will monitor and record blood pressure daily and as needed and will call PCP or specialist with any readings outside of recommended range Patient will keep all recommended follow-up appointments with PCP and specialists (cardiology, nephrology, etc) Patient will take blood pressure log to PCP and specialty appointments for review Patient will follow a low sodium/DASH diet  Patient will reach out to RN Care Coordinator [phone] with any care coordination or resource needs       COMPLETED: Manage Dizziness   On track    Care Coordination Goals: Patient will take medication as prescribed Patient will follow-up with PCP as directed Patient will reach out to PCP with any new or worsening symptoms Patient will move carefully and change positions slowly to decrease risk of falls Patient will use other fall precautions Patient will use assistive devices for ambulation, especially when dizzy Patient will verbalize understanding of medication side effects Patient will reach out to RN Care Coordinator [phone] with any resource or care coordination needs     Manage Lower Extremity Edema   On track    Care Coordination Goals: Patient will follow a low sodium/DASH Diet Patient will weigh and record weight each morning after urinating and call cardiologist  with any weight gain of >2 lbs overnight or >5 lbs in one week Patient will f/u with PCP as directed  Patient will rescheduled missed appointment with cardiologist Patient will reach out to [facility] at [phone] with any care coordination or resource needs          SDOH assessments and interventions completed:  Yes  SDOH Interventions Today    Flowsheet Row Most Recent Value  SDOH Interventions   Transportation Interventions Intervention Not Indicated  Financial Strain Interventions Intervention Not Indicated        Care Coordination Interventions:  Yes, provided  Interventions Today    Flowsheet Row Most Recent Value  Chronic Disease   Chronic disease during today's visit Hypertension (HTN)  General Interventions   General Interventions Discussed/Reviewed --  [Reports blood pressure WNL but does not have any readings with her]  Doctor Visits Discussed/Reviewed Doctor Visits Reviewed, Doctor Visits Discussed, PCP, Specialist  Durable Medical Equipment (DME) BP Cuff  PCP/Specialist Visits Compliance with follow-up visit  [Encouraged to reschedule cardio follow-up]  Exercise Interventions   Exercise Discussed/Reviewed Physical Activity  Physical Activity Discussed/Reviewed Physical Activity Discussed, Physical Activity Reviewed  Education Interventions   Education Provided Provided Education  Provided Verbal Education On Nutrition, When to see the doctor, Mental Health/Coping with Illness, Medication, Exercise, Foot Care, Other  [blood pressure monitoring. Patient encouraged to schdule appointment with podiatrist to have toenails trimmed. Considering Dr Pricilla Holm in [place].]  Nutrition Interventions   Nutrition Discussed/Reviewed Nutrition Discussed, Nutrition Reviewed  [Discussed decreased appetite. Patient enjoys cooking  and prepares most of her meals at home. She does have a decreased appetite, but still eats meals regularly.]  Pharmacy Interventions   Pharmacy Dicussed/Reviewed  Pharmacy Topics Discussed, Pharmacy Topics Reviewed, Medications and their functions  Safety Interventions   Safety Discussed/Reviewed Safety Discussed, Fall Risk, Home Safety, Safety Reviewed       Follow up plan: Follow up call scheduled for 12/31/22    Encounter Outcome:  Pt. Visit Completed   Demetrios Loll, BSN, RN-BC RN Care Coordinator [facility]  [facility] Direct Dial: [phone] Main #: [phone]

## 2022-11-29 ENCOUNTER — Other Ambulatory Visit (HOSPITAL_COMMUNITY): Payer: Self-pay | Admitting: General Practice

## 2022-11-29 DIAGNOSIS — R413 Other amnesia: Secondary | ICD-10-CM

## 2022-12-02 ENCOUNTER — Telehealth: Payer: Self-pay | Admitting: Cardiology

## 2022-12-02 NOTE — Telephone Encounter (Signed)
Pt c/o BP issue: STAT if pt c/o blurred vision, one-sided weakness or slurred speech  1. What are your last 5 BP readings? 120/60 115/63   2. Are you having any other symptoms (ex. Dizziness, headache, blurred vision, passed out)? Weakness, SOB and can not stand up over 5 or 10 mins without feeling like her legs will give out.   3. What is your BP issue? Patient states that she's been having weakness off and on the last month and it seems to be getting worse.

## 2022-12-02 NOTE — Telephone Encounter (Signed)
Spoke with patient.  States she has been having sob & weakness off/on x the last year but getting worse over the last 2-3 weeks.  Appointment scheduled for evaluation as she was last seen 03/2022 (overdue for f/u).

## 2022-12-03 ENCOUNTER — Emergency Department (HOSPITAL_COMMUNITY): Payer: Medicare HMO

## 2022-12-03 ENCOUNTER — Encounter: Payer: Self-pay | Admitting: Nurse Practitioner

## 2022-12-03 ENCOUNTER — Inpatient Hospital Stay (HOSPITAL_COMMUNITY)
Admission: EM | Admit: 2022-12-03 | Discharge: 2022-12-05 | DRG: 291 | Disposition: A | Discharge status: Home Health | Payer: Medicare HMO | Attending: Family Medicine | Admitting: Family Medicine

## 2022-12-03 ENCOUNTER — Other Ambulatory Visit: Payer: Self-pay

## 2022-12-03 ENCOUNTER — Ambulatory Visit: Payer: Medicare HMO | Attending: Nurse Practitioner | Admitting: Nurse Practitioner

## 2022-12-03 VITALS — BP 122/70 | HR 70 | Ht 63.0 in | Wt 128.6 lb

## 2022-12-03 DIAGNOSIS — I513 Intracardiac thrombosis, not elsewhere classified: Secondary | ICD-10-CM

## 2022-12-03 DIAGNOSIS — K219 Gastro-esophageal reflux disease without esophagitis: Secondary | ICD-10-CM | POA: Diagnosis present

## 2022-12-03 DIAGNOSIS — I2510 Atherosclerotic heart disease of native coronary artery without angina pectoris: Secondary | ICD-10-CM | POA: Diagnosis not present

## 2022-12-03 DIAGNOSIS — Z885 Allergy status to narcotic agent status: Secondary | ICD-10-CM | POA: Diagnosis not present

## 2022-12-03 DIAGNOSIS — I5032 Chronic diastolic (congestive) heart failure: Secondary | ICD-10-CM | POA: Diagnosis not present

## 2022-12-03 DIAGNOSIS — Z7902 Long term (current) use of antithrombotics/antiplatelets: Secondary | ICD-10-CM

## 2022-12-03 DIAGNOSIS — Z79899 Other long term (current) drug therapy: Secondary | ICD-10-CM

## 2022-12-03 DIAGNOSIS — I252 Old myocardial infarction: Secondary | ICD-10-CM

## 2022-12-03 DIAGNOSIS — Z635 Disruption of family by separation and divorce: Secondary | ICD-10-CM

## 2022-12-03 DIAGNOSIS — R531 Weakness: Secondary | ICD-10-CM

## 2022-12-03 DIAGNOSIS — I959 Hypotension, unspecified: Secondary | ICD-10-CM

## 2022-12-03 DIAGNOSIS — M810 Age-related osteoporosis without current pathological fracture: Secondary | ICD-10-CM | POA: Diagnosis present

## 2022-12-03 DIAGNOSIS — E039 Hypothyroidism, unspecified: Secondary | ICD-10-CM | POA: Diagnosis present

## 2022-12-03 DIAGNOSIS — Z880 Allergy status to penicillin: Secondary | ICD-10-CM | POA: Diagnosis not present

## 2022-12-03 DIAGNOSIS — I5021 Acute systolic (congestive) heart failure: Secondary | ICD-10-CM | POA: Diagnosis not present

## 2022-12-03 DIAGNOSIS — I2489 Other forms of acute ischemic heart disease: Secondary | ICD-10-CM | POA: Diagnosis present

## 2022-12-03 DIAGNOSIS — I509 Heart failure, unspecified: Secondary | ICD-10-CM | POA: Diagnosis not present

## 2022-12-03 DIAGNOSIS — Z882 Allergy status to sulfonamides status: Secondary | ICD-10-CM

## 2022-12-03 DIAGNOSIS — R0600 Dyspnea, unspecified: Secondary | ICD-10-CM | POA: Diagnosis not present

## 2022-12-03 DIAGNOSIS — I5023 Acute on chronic systolic (congestive) heart failure: Secondary | ICD-10-CM | POA: Diagnosis present

## 2022-12-03 DIAGNOSIS — I10 Essential (primary) hypertension: Secondary | ICD-10-CM | POA: Diagnosis present

## 2022-12-03 DIAGNOSIS — F419 Anxiety disorder, unspecified: Secondary | ICD-10-CM | POA: Diagnosis present

## 2022-12-03 DIAGNOSIS — I255 Ischemic cardiomyopathy: Secondary | ICD-10-CM | POA: Diagnosis present

## 2022-12-03 DIAGNOSIS — J4489 Other specified chronic obstructive pulmonary disease: Secondary | ICD-10-CM | POA: Diagnosis present

## 2022-12-03 DIAGNOSIS — Z951 Presence of aortocoronary bypass graft: Secondary | ICD-10-CM | POA: Diagnosis not present

## 2022-12-03 DIAGNOSIS — R0602 Shortness of breath: Secondary | ICD-10-CM | POA: Diagnosis not present

## 2022-12-03 DIAGNOSIS — Z1152 Encounter for screening for COVID-19: Secondary | ICD-10-CM

## 2022-12-03 DIAGNOSIS — J449 Chronic obstructive pulmonary disease, unspecified: Secondary | ICD-10-CM | POA: Diagnosis not present

## 2022-12-03 DIAGNOSIS — Z833 Family history of diabetes mellitus: Secondary | ICD-10-CM | POA: Diagnosis not present

## 2022-12-03 DIAGNOSIS — Z883 Allergy status to other anti-infective agents status: Secondary | ICD-10-CM | POA: Diagnosis not present

## 2022-12-03 DIAGNOSIS — Z79890 Hormone replacement therapy (postmenopausal): Secondary | ICD-10-CM

## 2022-12-03 DIAGNOSIS — J90 Pleural effusion, not elsewhere classified: Secondary | ICD-10-CM | POA: Diagnosis not present

## 2022-12-03 DIAGNOSIS — M7989 Other specified soft tissue disorders: Secondary | ICD-10-CM | POA: Diagnosis present

## 2022-12-03 DIAGNOSIS — I517 Cardiomegaly: Secondary | ICD-10-CM | POA: Diagnosis not present

## 2022-12-03 DIAGNOSIS — K449 Diaphragmatic hernia without obstruction or gangrene: Secondary | ICD-10-CM | POA: Diagnosis present

## 2022-12-03 DIAGNOSIS — I5043 Acute on chronic combined systolic (congestive) and diastolic (congestive) heart failure: Secondary | ICD-10-CM | POA: Diagnosis not present

## 2022-12-03 DIAGNOSIS — R600 Localized edema: Secondary | ICD-10-CM | POA: Diagnosis not present

## 2022-12-03 DIAGNOSIS — J439 Emphysema, unspecified: Secondary | ICD-10-CM | POA: Diagnosis present

## 2022-12-03 DIAGNOSIS — I110 Hypertensive heart disease with heart failure: Principal | ICD-10-CM | POA: Diagnosis present

## 2022-12-03 DIAGNOSIS — Z91041 Radiographic dye allergy status: Secondary | ICD-10-CM | POA: Diagnosis not present

## 2022-12-03 DIAGNOSIS — I25810 Atherosclerosis of coronary artery bypass graft(s) without angina pectoris: Secondary | ICD-10-CM | POA: Diagnosis not present

## 2022-12-03 DIAGNOSIS — E782 Mixed hyperlipidemia: Secondary | ICD-10-CM | POA: Diagnosis not present

## 2022-12-03 DIAGNOSIS — Z9861 Coronary angioplasty status: Secondary | ICD-10-CM

## 2022-12-03 DIAGNOSIS — R001 Bradycardia, unspecified: Secondary | ICD-10-CM | POA: Diagnosis present

## 2022-12-03 DIAGNOSIS — I5033 Acute on chronic diastolic (congestive) heart failure: Secondary | ICD-10-CM | POA: Diagnosis not present

## 2022-12-03 DIAGNOSIS — E785 Hyperlipidemia, unspecified: Secondary | ICD-10-CM | POA: Diagnosis not present

## 2022-12-03 DIAGNOSIS — I700 Atherosclerosis of aorta: Secondary | ICD-10-CM | POA: Diagnosis not present

## 2022-12-03 LAB — URINALYSIS, W/ REFLEX TO CULTURE (INFECTION SUSPECTED)
Bilirubin Urine: NEGATIVE
Glucose, UA: NEGATIVE mg/dL
Ketones, ur: NEGATIVE mg/dL
Nitrite: NEGATIVE
Protein, ur: NEGATIVE mg/dL
Specific Gravity, Urine: 1.009 (ref 1.005–1.030)
pH: 6 (ref 5.0–8.0)

## 2022-12-03 LAB — BASIC METABOLIC PANEL
Anion gap: 9 (ref 5–15)
BUN: 11 mg/dL (ref 8–23)
CO2: 25 mmol/L (ref 22–32)
Calcium: 9.2 mg/dL (ref 8.9–10.3)
Chloride: 103 mmol/L (ref 98–111)
Creatinine, Ser: 1 mg/dL (ref 0.44–1.00)
GFR, Estimated: 57 mL/min — ABNORMAL LOW (ref >60)
Glucose, Bld: 112 mg/dL — ABNORMAL HIGH (ref 70–99)
Potassium: 3.5 mmol/L (ref 3.5–5.1)
Sodium: 137 mmol/L (ref 135–145)

## 2022-12-03 LAB — D-DIMER, QUANTITATIVE: D-Dimer, Quant: 0.7 ug/mL-FEU — ABNORMAL HIGH (ref 0.00–0.50)

## 2022-12-03 LAB — CBC
HCT: 39.4 % (ref 36.0–46.0)
Hemoglobin: 12.3 g/dL (ref 12.0–15.0)
MCH: 25.7 pg — ABNORMAL LOW (ref 26.0–34.0)
MCHC: 31.2 g/dL (ref 30.0–36.0)
MCV: 82.4 fL (ref 80.0–100.0)
Platelets: 366 10*3/uL (ref 150–400)
RBC: 4.78 MIL/uL (ref 3.87–5.11)
RDW: 14.8 % (ref 11.5–15.5)
WBC: 7.6 10*3/uL (ref 4.0–10.5)
nRBC: 0 % (ref 0.0–0.2)

## 2022-12-03 LAB — TROPONIN I (HIGH SENSITIVITY)
Troponin I (High Sensitivity): 29 ng/L — ABNORMAL HIGH (ref <18)
Troponin I (High Sensitivity): 29 ng/L — ABNORMAL HIGH (ref <18)

## 2022-12-03 LAB — BRAIN NATRIURETIC PEPTIDE: B Natriuretic Peptide: 710 pg/mL — ABNORMAL HIGH (ref 0.0–100.0)

## 2022-12-03 MED ORDER — METHOCARBAMOL 500 MG PO TABS
500.0000 mg | ORAL_TABLET | Freq: Once | ORAL | Status: AC
  Administered 2022-12-04: 500 mg via ORAL
  Filled 2022-12-03: qty 1

## 2022-12-03 MED ORDER — ACETAMINOPHEN 500 MG PO TABS
1000.0000 mg | ORAL_TABLET | Freq: Once | ORAL | Status: AC
  Administered 2022-12-04: 1000 mg via ORAL
  Filled 2022-12-03: qty 2

## 2022-12-03 MED ORDER — FUROSEMIDE 10 MG/ML IJ SOLN
80.0000 mg | Freq: Once | INTRAMUSCULAR | Status: AC
  Administered 2022-12-03: 80 mg via INTRAVENOUS
  Filled 2022-12-03: qty 8

## 2022-12-03 NOTE — ED Notes (Addendum)
Pt able to tranfer from bed to wheelchair in order to go to bathroom

## 2022-12-03 NOTE — ED Provider Notes (Signed)
[place] EMERGENCY DEPARTMENT AT [facility] Provider Note   CSN: [id number] Arrival date & time: 12/03/22  1807     History  Chief Complaint  Patient presents with   Leg Swelling    Debbie [name] is a 82 y.o. female with PMH as listed below who presents with having no energy over the past year and feels like it is getting worsening over the last couple of days. Also endorses shortness of breath. Yesterday couldn't get out of bed d/t weakness, which is how she felt when she had her prior MI. With prior MI didn't have any chest pain, just weakness and tightness, which is the same as how she feels now. Endorses chest heaviness. Hasn't tried nitroglycerin today. +cough, nonproductive. Pt reports her cardiologist wanted her to come to the ER because of swelling in her feet. +orthopnea and DOE. Feels mildly short of breath as she lays here now. Is prescribed 120 mg lasix every day. No sick contacts. Lives by herself. Takes plavix, no other blood thinner. No h/o DVT/PE, recent hospitalizations/surgeries. Has h/o CAD s/p CABG 2006 with recetn NSTEMI 2023. Has HF with recovered EF, last EF 55-60% in 02/2022.   Past Medical History:  Diagnosis Date   Allergic rhinitis    Anxiety    Arthritis    Asthma    Bladder spasms    Carotid artery disease (HCC)    Collagen vascular disease (HCC)    COPD (chronic obstructive pulmonary disease) (HCC)    Cystocele    Essential hypertension    GERD (gastroesophageal reflux disease)    Hyperlipidemia    Hypothyroidism    Ischemic cardiomyopathy 12/02/2021   Echo 11/27/2021:     Multiple vessel coronary artery disease => CABG (LIMA-dLAD, SVG-Diag, Seq SVG-OM-OM-PDA)    a. s/p CABG in 2006 b. NSTEMI in 11/2021 with DES to LM and LCx and repeat cath later in admission with DES to SVG-D1   NSTEMI (non-ST elevated myocardial infarction) (HCC) 11/22/2021   Osteoporosis    Renal insufficiency    Vaginal vault prolapse        Home  Medications Prior to Admission medications   Medication Sig Start Date End Date Taking? Authorizing Provider  acetaminophen (TYLENOL) 650 MG CR tablet Take 650 mg by mouth in the morning.    [provider]  atorvastatin (LIPITOR) 80 MG tablet Take 1 tablet (80 mg total) by mouth at bedtime. 01/03/19   Shon Hale, MD  bisoprolol (ZEBETA) 5 MG tablet Take 0.5 tablets (2.5 mg total) by mouth daily. 12/08/21   Johnson, Clanford L, MD  cephALEXin (KEFLEX) 500 MG capsule Take 500 mg by mouth 3 (three) times daily. 04/19/22   [provider]  clopidogrel (PLAVIX) 75 MG tablet Take 1 tablet (75 mg total) by mouth daily. 03/26/22   Jonelle Sidle, MD  ezetimibe (ZETIA) 10 MG tablet Take 1 tablet (10 mg total) by mouth daily. 11/29/21   Duke, Roe Rutherford, PA  famotidine (PEPCID) 20 MG tablet Take 40 mg by mouth at bedtime. 08/18/18   [provider]  furosemide (LASIX) 80 MG tablet Take 1 tablet (80 mg total) by mouth daily. Patient taking differently: Take 120 mg by mouth daily. 04/17/22 04/12/23  Strader, Lennart Pall, PA-C  isosorbide mononitrate (IMDUR) 30 MG 24 hr tablet Take 1 tablet (30 mg total) by mouth daily. 12/08/21   Johnson, Clanford L, MD  levocetirizine (XYZAL) 5 MG tablet Take 5 mg by mouth every evening. Equate  levocetirizine.    [provider]  levothyroxine (SYNTHROID) 88 MCG tablet Take 88 mcg by mouth daily. 09/04/18   [provider]  nitroGLYCERIN (NITROSTAT) 0.4 MG SL tablet Place 1 tablet (0.4 mg total) under the tongue every 5 (five) minutes x 3 doses as needed for chest pain (if no relief after 3rd dose, call 911 or proceed to the ED for an evaluation). 08/06/19   Netta Neat., NP  ondansetron (ZOFRAN) 4 MG tablet Take 1 tablet (4 mg total) by mouth every 6 (six) hours as needed for nausea. 10/20/21   [name], Pratik D, DO  pantoprazole (PROTONIX) 40 MG tablet Take 40 mg by mouth daily. 10/21/21   [provider]  potassium  chloride (KLOR-CON M) 10 MEQ tablet Take 1 tablet (10 mEq total) by mouth daily. Patient taking differently: Take 20 mEq by mouth daily. 12/07/21   Johnson, Clanford L, MD  potassium chloride (KLOR-CON) 10 MEQ tablet Take 10 mEq by mouth 2 (two) times daily. 04/24/22   [provider]  tamsulosin (FLOMAX) 0.4 MG CAPS capsule Take 0.4 mg by mouth.    [provider]      Allergies    Ivp dye [iodinated contrast media], Betadine [povidone iodine], Codeine, Other, Penicillins, and Sulfa antibiotics    Review of Systems   Review of Systems A 10 point review of systems was performed and is negative unless otherwise reported in HPI.  Physical Exam Updated Vital Signs BP (!) 161/85   Pulse 70   Temp 98.8 F (37.1 C) (Oral)   Resp 15   Ht 5\' 3"  (1.6 m)   Wt 58.3 kg   SpO2 98%   BMI 22.78 kg/m  Physical Exam General: Normal appearing female, lying in bed.  HEENT: Sclera anicteric, MMM, trachea midline.  Cardiology: RRR, no murmurs/rubs/gallops. BL radial and DP pulses equal bilaterally.  Resp: Normal respiratory rate and effort. CTAB, no wheezes, rhonchi, crackles.  Abd: Soft, non-tender, non-distended. No rebound tenderness or guarding.  GU: Deferred. MSK: 2+ pitting edema BL LEs symmetric; Extremities without deformity or TTP.  Skin: warm, dry.  Neuro: A&Ox4, CNs II-XII grossly intact. MAEs. Sensation grossly intact.  Psych: Normal mood and affect.   ED Results / Procedures / Treatments   Labs (all labs ordered are listed, but only abnormal results are displayed) Labs Reviewed  BASIC METABOLIC PANEL - Abnormal; Notable for the following components:      Result Value   Glucose, Bld 112 (*)    GFR, Estimated 57 (*)    All other components within normal limits  CBC - Abnormal; Notable for the following components:   MCH 25.7 (*)    All other components within normal limits  BRAIN NATRIURETIC PEPTIDE - Abnormal; Notable for the following components:   B  Natriuretic Peptide 710.0 (*)    All other components within normal limits  D-DIMER, QUANTITATIVE - Abnormal; Notable for the following components:   D-Dimer, Quant 0.70 (*)    All other components within normal limits  TROPONIN I (HIGH SENSITIVITY) - Abnormal; Notable for the following components:   Troponin I (High Sensitivity) 29 (*)    All other components within normal limits  TROPONIN I (HIGH SENSITIVITY) - Abnormal; Notable for the following components:   Troponin I (High Sensitivity) 29 (*)    All other components within normal limits  SARS CORONAVIRUS 2 BY RT PCR  URINALYSIS, W/ REFLEX TO CULTURE (INFECTION SUSPECTED)    EKG EKG Interpretation  Date/Time:  Tuesday December 03 2022 18:40:26 EDT Ventricular Rate:  59 PR Interval:  134 QRS Duration:  86 QT Interval:  420 QTC Calculation: 415 R Axis:   62  Text Interpretation: Sinus bradycardia Nonspecific ST and T wave abnormality Confirmed by Vivi Barrack [phone]) on 12/03/2022 9:15:54 PM  Radiology DG Chest 2 View  Result Date: 12/03/2022 CLINICAL DATA:  Pedal edema EXAM: CHEST - 2 VIEW COMPARISON:  Radiograph 12/05/2021 FINDINGS: Stable enlargement of the cardiomediastinal silhouette. Sternotomy and CABG. Aortic atherosclerotic calcification. Hyperinflation and chronic bronchitic changes. Small left pleural effusion. No focal consolidation pneumothorax. IMPRESSION: Small left pleural effusion. Emphysema. Cardiomegaly. Electronically Signed   By: Minerva Fester M.D.   On: 12/03/2022 19:54    Procedures Procedures    Medications Ordered in ED Medications  furosemide (LASIX) injection 80 mg (80 mg Intravenous Given 12/03/22 2306)    ED Course/ Medical Decision Making/ A&P                          Medical Decision Making Amount and/or Complexity of Data Reviewed Labs: ordered. Decision-making details documented in ED Course. Radiology: ordered.  Risk OTC drugs. Prescription drug management. Decision regarding  hospitalization.    This patient presents to the ED for concern of DOE, fatigue/weakness, chest pressure; this involves an extensive number of treatment options, and is a complaint that carries with it a high risk of complications and morbidity.  I considered the following differential and admission for this acute, potentially life threatening condition. No hypoxia, stable on RA, mildly hypertensive.  MDM:    Patient presents with acute on chronic dyspnea on exertion and fatigue associated with chest pressure that she states feels exactly like her prior MI.  This is very concerning for ACS.  EKG without any signs of ischemia or arrhythmia.  She also is likely volume overloaded and was saw her cardiologist today who was concerned for the same, has 2+ pitting edema bilaterally and small pleural effusion on chest x-ray with cardiomegaly.  This is indicating heart failure which is consistent with patient's symptoms.  She does report a mild cough but there is no signs of pneumonia and no wheezing to indicate COPD.  She has no asymmetric swelling, tachycardia, hypoxia, tachypnea to indicate pulmonary embolism and I do not believe that is highest on the differential, and additionally age-adjusted D-dimer was negative for her.  She does not have any significant metabolic derangements, anemia that would explain her generalized weakness/fatigue either.  Believe the patient would do well with IV diuresis however given that she states she feels the same as her prior MI, though her troponin is flat, will discuss with cardiology and hospitalist for hobs admission and trending troponins with IV diuresis.    Clinical Course as of 12/03/22 2333  Tue Dec 03, 2022  2242 Troponin I (High Sensitivity)(!): 29 Flat 29-29 [HN]  2312 D-Dimer, Quant(!): 0.70 Neg age-adjusted D-dimer [HN]  2312 B Natriuretic Peptide(!): 710.0 Elevated, likely HF exacerbation [HN]  2314 Will call cards [HN]  2327 D/w cardiology who  recommends admission for obs, serial EKG, serial troponins, and telemetry for obs overnight. [HN]    Clinical Course User Index [HN] Loetta Rough, MD    Labs: I Ordered, and personally interpreted labs.  The pertinent results include: Those listed above  Imaging Studies ordered: I ordered imaging studies including chest x-ray I independently visualized and interpreted imaging. I agree with the radiologist interpretation  Additional  history obtained from chart review.    Cardiac Monitoring: The patient was maintained on a cardiac monitor.  I personally viewed and interpreted the cardiac monitored which showed an underlying rhythm of: Normal sinus rhythm  Reevaluation: After the interventions noted above, I reevaluated the patient and found that they have :stayed the same  Social Determinants of Health:  patient lives alone  Disposition:  Admit to hospitalist  Co morbidities that complicate the patient evaluation  Past Medical History:  Diagnosis Date   Allergic rhinitis    Anxiety    Arthritis    Asthma    Bladder spasms    Carotid artery disease (HCC)    Collagen vascular disease (HCC)    COPD (chronic obstructive pulmonary disease) (HCC)    Cystocele    Essential hypertension    GERD (gastroesophageal reflux disease)    Hyperlipidemia    Hypothyroidism    Ischemic cardiomyopathy 12/02/2021   Echo 11/27/2021:     Multiple vessel coronary artery disease => CABG (LIMA-dLAD, SVG-Diag, Seq SVG-OM-OM-PDA)    a. s/p CABG in 2006 b. NSTEMI in 11/2021 with DES to LM and LCx and repeat cath later in admission with DES to SVG-D1   NSTEMI (non-ST elevated myocardial infarction) (HCC) 11/22/2021   Osteoporosis    Renal insufficiency    Vaginal vault prolapse      Medicines Meds ordered this encounter  Medications   furosemide (LASIX) injection 80 mg    I have reviewed the patients home medicines and have made adjustments as needed  Problem List / ED Course: Problem  List Items Addressed This Visit       Other   Generalized weakness   Other Visit Diagnoses     Acute on chronic congestive heart failure, unspecified heart failure type (HCC)    -  Primary   Relevant Medications   furosemide (LASIX) injection 80 mg (Completed)                   This note was created using dictation software, which may contain spelling or grammatical errors.    Loetta Rough, MD 12/05/22 2031

## 2022-12-03 NOTE — ED Triage Notes (Signed)
Pt reports she has been having no energy over the past year and feels like it is getting worse.  Pt reports her cardiologist wanted her to come to the ER because of swelling in her feet.

## 2022-12-03 NOTE — Progress Notes (Addendum)
Cardiology Office Note:  .   Date:  12/03/2022  ID:  Debbie [name], DOB 07/17/1940, MRN [id number] PCP: Richardean Chimera, MD  [facility] Providers Cardiologist:  [name], MD    History of Present Illness: .   Debbie [name] is a 82 y.o. 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   Last seen by Randall An, PA-C on April 16, 2022. She was overall doing well at the time from a cardiac perspective. Note states Eliquis was d/c earlier 03/2022 d/t progressive bruising and d/t last echo 02/2022 showed loosely organized thrombotic material and slow flow at LV apex but no clearly formed mural thrombus. Reported improvement in symptoms at last OV since Eliquis was stopped.   She recently contacted our office noting shortness of breath and weakness intermittently the past year but getting worse over the last 2 to 3 weeks.  Today she presents for [facility] evaluation. She states that she has been having consistent weakness in the morning, blood pressure has been intermittently low in the mornings, 76/66 in the mornings, rechecked to be 119/64.  Says she does not feel good, says she knew something was not right as she felt weakness right before she had her heart attack previously. Knew she needed to check checked out. Orthostatics negative in office today.  Does admit to stable, consistent shortness of breath related to her COPD. Denies any chest pain, however she never had chest pain with her past MI. She denies any palpitations, syncope, presyncope, dizziness, orthopnea, PND, or significant weight changes, acute bleeding, or claudication.  Studies Reviewed: [name]    EKG: EKG Interpretation Date/Time:  Tuesday December 03 2022 14:56:02 EDT Ventricular Rate:  66 PR Interval:  136 QRS Duration:  78 QT Interval:  440 QTC Calculation: 461 R  Axis:   31  Text Interpretation: Normal sinus rhythm Septal infarct , age undetermined When compared with ECG of 05-Dec-2021 11:29, PREVIOUS ECG IS PRESENT Confirmed by Sharlene Dory [phone]) on 12/03/2022 3:11:14 PM    Limited echo 02/2022: 1. Limited study.   2. Left ventricular ejection fraction, by estimation, is 55 to 60%. The  left ventricle has normal function. The left ventricle demonstrates  regional wall motion abnormalities (see scoring diagram/findings for  description).   3. Definity contrast defines loosely organized thormbotic material and  slow flow at LV apex, but no clearly formed mural thrombus.   4. Right ventricular systolic function is normal. The right ventricular  size is normal.   5. The aortic valve is tricuspid. Aortic valve regurgitation is not  visualized.   Comparison(s): Prior images reviewed side by side. LVEF has improved and  although LV apical thrombotic material and slow flow are noted, there is  no clearly formed mural thrombus.  LHC 11/24/2021:    Dist LM to Prox LAD lesion is 100% stenosed.   Ost LM to Dist LM lesion is 25% stenosed.   Dist Cx lesion is 50% stenosed.   Origin to Prox Graft lesion is 99% stenosed.   Previously placed Ost Cx to Prox Cx stent of unknown type is  widely patent.   A drug-eluting stent was successfully placed using a STENT ONYX FRONTIER [id number].   Post intervention, there is a 0% residual stenosis.  LHC 11/23/2021:  1. Severe native vessel coronary artery disease with severe  ostial left main stenosis, total occlusion of the LAD, severe stenosis in the distal left main/circumflex junction, severe diffuse stenosis of the distal AV circumflex, and a diffusely diseased nondominant RCA 2. Status post aortocoronary bypass surgery with continued patency of the LIMA to LAD and occlusion of the saphenous vein grafts 3. Successful PCI of the native left main and native circumflex using a 4.0 x 24 mm Synergy DES  Recommendation:  Dual antiplatelet therapy with aspirin and ticagrelor at least 12 months, aggressive risk reduction measures, suspect the patient occluded a vein graft causing her acute coronary syndrome. There were no vein graft targets for PCI. Her native left main into the circumflex is treated as this large territory is now potentially ischemic after occlusion of the sequential saphenous vein graft to OM and PLA.  Physical Exam:   VS:  BP 122/70   Pulse 70   Ht 5\' 3"  (1.6 m)   Wt 128 lb 9.6 oz (58.3 kg)   SpO2 97%   BMI 22.78 kg/m    Wt Readings from Last 3 Encounters:  12/03/22 128 lb 9.6 oz (58.3 kg)  04/16/22 127 lb 12.8 oz (58 kg)  12/12/21 128 lb 9.6 oz (58.3 kg)    GEN: Well nourished, well developed in no acute distress NECK: No JVD; No carotid bruits CARDIAC: S1/S2, RRR, no murmurs, rubs, gallops RESPIRATORY:  Clear to auscultation without rales, wheezing or rhonchi  ABDOMEN: Soft, non-tender, non-distended EXTREMITIES:  2+ pitting edema; No deformity   ASSESSMENT AND PLAN: .    Weakness CAD, s/p CABG, s/p NSTEMI Hypotension, HTN HFimpEF, leg edema Hx of LV thrombus and COPD, shortness of breath  Patient is a 82 year old female with past medical history as mentioned above.  She recently contacted our office noting intermittent shortness of breath and weakness the past year, progressively getting worse over the past 2 to 3 weeks.  She stated this concerned her and wanted to get checked out as these are her symptoms she noticed when she had her past heart attack.  She consistently notices weakness when she gets up in the morning.  Blood pressure checked 1 morning that was 76/66.  States this has been consistently getting more frequent.  Orthostatics in office negative today.  She does show evidence of volume overload, does show 2+ pitting edema on exam.  Discussed inpatient versus outpatient management, and patient is agreeable with inpatient evaluation at [facility]. Report given to Dr.  Estell Harpin ([facility] ED physician) who verbalized understanding of report.   Upon arrival to the ED at [facility] I recommend the following be done/obtained: 1.  Twelve-lead EKG and close monitoring of vital signs 2.  Lab work including: Serial troponins, CBC, CMET, proBNP, D-dimer, and electrolytes. 3.  2 view chest x-ray, CT scan of chest, and 2D complete echocardiogram 4.  Consult cardiology if needed 5.  Patient may require IV diuresis depending on labs 6. Consult PT/OT for weakness 7. Discharge when in stable condition 8. Follow-up with outpatient cardiology in 1-2 weeks post discharge from hospital.     Signed, Sharlene Dory, NP

## 2022-12-03 NOTE — Patient Instructions (Addendum)
Medication Instructions:  Your physician recommends that you continue on your current medications as directed. Please refer to the Current Medication list given to you today.  Labwork: none  Testing/Procedures: none  Follow-Up: Your physician recommends that you schedule a follow-up appointment in: 1-2 weeks with Philis Nettle   Any Other Special Instructions Will Be Listed Below (If Applicable). Sent to [facility] ED for what is felt to be CHF exacerbation   If you need a refill on your cardiac medications before your next appointment, please call your pharmacy.

## 2022-12-04 ENCOUNTER — Other Ambulatory Visit (HOSPITAL_COMMUNITY): Payer: Self-pay | Admitting: *Deleted

## 2022-12-04 ENCOUNTER — Inpatient Hospital Stay (HOSPITAL_COMMUNITY): Payer: Medicare HMO

## 2022-12-04 DIAGNOSIS — Z79899 Other long term (current) drug therapy: Secondary | ICD-10-CM | POA: Diagnosis not present

## 2022-12-04 DIAGNOSIS — Z635 Disruption of family by separation and divorce: Secondary | ICD-10-CM | POA: Diagnosis not present

## 2022-12-04 DIAGNOSIS — J439 Emphysema, unspecified: Secondary | ICD-10-CM | POA: Diagnosis present

## 2022-12-04 DIAGNOSIS — K219 Gastro-esophageal reflux disease without esophagitis: Secondary | ICD-10-CM

## 2022-12-04 DIAGNOSIS — I5021 Acute systolic (congestive) heart failure: Secondary | ICD-10-CM

## 2022-12-04 DIAGNOSIS — Z833 Family history of diabetes mellitus: Secondary | ICD-10-CM | POA: Diagnosis not present

## 2022-12-04 DIAGNOSIS — E782 Mixed hyperlipidemia: Secondary | ICD-10-CM

## 2022-12-04 DIAGNOSIS — Z1152 Encounter for screening for COVID-19: Secondary | ICD-10-CM | POA: Diagnosis not present

## 2022-12-04 DIAGNOSIS — Z91041 Radiographic dye allergy status: Secondary | ICD-10-CM | POA: Diagnosis not present

## 2022-12-04 DIAGNOSIS — J449 Chronic obstructive pulmonary disease, unspecified: Secondary | ICD-10-CM

## 2022-12-04 DIAGNOSIS — R531 Weakness: Secondary | ICD-10-CM | POA: Diagnosis not present

## 2022-12-04 DIAGNOSIS — I10 Essential (primary) hypertension: Secondary | ICD-10-CM | POA: Diagnosis not present

## 2022-12-04 DIAGNOSIS — I509 Heart failure, unspecified: Secondary | ICD-10-CM

## 2022-12-04 DIAGNOSIS — M810 Age-related osteoporosis without current pathological fracture: Secondary | ICD-10-CM | POA: Diagnosis present

## 2022-12-04 DIAGNOSIS — I110 Hypertensive heart disease with heart failure: Secondary | ICD-10-CM | POA: Diagnosis present

## 2022-12-04 DIAGNOSIS — J4489 Other specified chronic obstructive pulmonary disease: Secondary | ICD-10-CM | POA: Diagnosis present

## 2022-12-04 DIAGNOSIS — E039 Hypothyroidism, unspecified: Secondary | ICD-10-CM

## 2022-12-04 DIAGNOSIS — E785 Hyperlipidemia, unspecified: Secondary | ICD-10-CM | POA: Diagnosis not present

## 2022-12-04 DIAGNOSIS — Z882 Allergy status to sulfonamides status: Secondary | ICD-10-CM | POA: Diagnosis not present

## 2022-12-04 DIAGNOSIS — I2489 Other forms of acute ischemic heart disease: Secondary | ICD-10-CM | POA: Diagnosis present

## 2022-12-04 DIAGNOSIS — I2510 Atherosclerotic heart disease of native coronary artery without angina pectoris: Secondary | ICD-10-CM

## 2022-12-04 DIAGNOSIS — I5023 Acute on chronic systolic (congestive) heart failure: Secondary | ICD-10-CM | POA: Diagnosis present

## 2022-12-04 DIAGNOSIS — I25810 Atherosclerosis of coronary artery bypass graft(s) without angina pectoris: Secondary | ICD-10-CM | POA: Diagnosis not present

## 2022-12-04 DIAGNOSIS — Z885 Allergy status to narcotic agent status: Secondary | ICD-10-CM | POA: Diagnosis not present

## 2022-12-04 DIAGNOSIS — Z951 Presence of aortocoronary bypass graft: Secondary | ICD-10-CM | POA: Diagnosis not present

## 2022-12-04 DIAGNOSIS — I252 Old myocardial infarction: Secondary | ICD-10-CM | POA: Diagnosis not present

## 2022-12-04 DIAGNOSIS — Z880 Allergy status to penicillin: Secondary | ICD-10-CM | POA: Diagnosis not present

## 2022-12-04 DIAGNOSIS — K449 Diaphragmatic hernia without obstruction or gangrene: Secondary | ICD-10-CM | POA: Diagnosis present

## 2022-12-04 DIAGNOSIS — I5043 Acute on chronic combined systolic (congestive) and diastolic (congestive) heart failure: Secondary | ICD-10-CM | POA: Diagnosis not present

## 2022-12-04 DIAGNOSIS — I700 Atherosclerosis of aorta: Secondary | ICD-10-CM | POA: Diagnosis present

## 2022-12-04 DIAGNOSIS — I5033 Acute on chronic diastolic (congestive) heart failure: Secondary | ICD-10-CM

## 2022-12-04 DIAGNOSIS — Z7902 Long term (current) use of antithrombotics/antiplatelets: Secondary | ICD-10-CM | POA: Diagnosis not present

## 2022-12-04 DIAGNOSIS — Z883 Allergy status to other anti-infective agents status: Secondary | ICD-10-CM | POA: Diagnosis not present

## 2022-12-04 DIAGNOSIS — I255 Ischemic cardiomyopathy: Secondary | ICD-10-CM | POA: Diagnosis present

## 2022-12-04 LAB — CBC
HCT: 36.8 % (ref 36.0–46.0)
Hemoglobin: 11.8 g/dL — ABNORMAL LOW (ref 12.0–15.0)
MCH: 26 pg (ref 26.0–34.0)
MCHC: 32.1 g/dL (ref 30.0–36.0)
MCV: 81.1 fL (ref 80.0–100.0)
Platelets: 343 10*3/uL (ref 150–400)
RBC: 4.54 MIL/uL (ref 3.87–5.11)
RDW: 14.9 % (ref 11.5–15.5)
WBC: 7.1 10*3/uL (ref 4.0–10.5)
nRBC: 0 % (ref 0.0–0.2)

## 2022-12-04 LAB — ECHOCARDIOGRAM COMPLETE
Area-P 1/2: 4.15 cm2
Height: 63 in
MV M vel: 5 m/s
MV Peak grad: 100 mmHg
Radius: 0.6 cm
S' Lateral: 3.5 cm
Weight: 1992.96 oz

## 2022-12-04 LAB — COMPREHENSIVE METABOLIC PANEL
ALT: 14 U/L (ref 0–44)
AST: 15 U/L (ref 15–41)
Albumin: 3.2 g/dL — ABNORMAL LOW (ref 3.5–5.0)
Alkaline Phosphatase: 85 U/L (ref 38–126)
Anion gap: 5 (ref 5–15)
BUN: 10 mg/dL (ref 8–23)
CO2: 30 mmol/L (ref 22–32)
Calcium: 9.1 mg/dL (ref 8.9–10.3)
Chloride: 104 mmol/L (ref 98–111)
Creatinine, Ser: 0.89 mg/dL (ref 0.44–1.00)
GFR, Estimated: >60 mL/min (ref >60)
Glucose, Bld: 106 mg/dL — ABNORMAL HIGH (ref 70–99)
Potassium: 3.2 mmol/L — ABNORMAL LOW (ref 3.5–5.1)
Sodium: 139 mmol/L (ref 135–145)
Total Bilirubin: 0.8 mg/dL (ref 0.3–1.2)
Total Protein: 5.8 g/dL — ABNORMAL LOW (ref 6.5–8.1)

## 2022-12-04 LAB — TSH: TSH: 15.032 u[IU]/mL — ABNORMAL HIGH (ref 0.350–4.500)

## 2022-12-04 LAB — PHOSPHORUS: Phosphorus: 4.4 mg/dL (ref 2.5–4.6)

## 2022-12-04 LAB — MAGNESIUM: Magnesium: 2 mg/dL (ref 1.7–2.4)

## 2022-12-04 LAB — SARS CORONAVIRUS 2 BY RT PCR: SARS Coronavirus 2 by RT PCR: NEGATIVE

## 2022-12-04 MED ORDER — EZETIMIBE 10 MG PO TABS
10.0000 mg | ORAL_TABLET | Freq: Every day | ORAL | Status: DC
  Administered 2022-12-04 – 2022-12-05 (×2): 10 mg via ORAL
  Filled 2022-12-04 (×2): qty 1

## 2022-12-04 MED ORDER — ENOXAPARIN SODIUM 40 MG/0.4ML IJ SOSY: 40.0000 mg | PREFILLED_SYRINGE | INTRAMUSCULAR | Status: DC

## 2022-12-04 MED ORDER — LEVOTHYROXINE SODIUM 88 MCG PO TABS
88.0000 ug | ORAL_TABLET | Freq: Every day | ORAL | Status: DC
  Administered 2022-12-04 – 2022-12-05 (×2): 88 ug via ORAL
  Filled 2022-12-04 (×2): qty 1

## 2022-12-04 MED ORDER — ATORVASTATIN CALCIUM 40 MG PO TABS
80.0000 mg | ORAL_TABLET | Freq: Every day | ORAL | Status: DC
  Administered 2022-12-04: 80 mg via ORAL
  Filled 2022-12-04: qty 2

## 2022-12-04 MED ORDER — ISOSORBIDE MONONITRATE ER 30 MG PO TB24
30.0000 mg | ORAL_TABLET | Freq: Every day | ORAL | Status: DC
  Administered 2022-12-04 – 2022-12-05 (×2): 30 mg via ORAL
  Filled 2022-12-04 (×2): qty 1

## 2022-12-04 MED ORDER — PANTOPRAZOLE SODIUM 40 MG PO TBEC
40.0000 mg | DELAYED_RELEASE_TABLET | Freq: Every day | ORAL | Status: DC
  Administered 2022-12-04 – 2022-12-05 (×2): 40 mg via ORAL
  Filled 2022-12-04 (×2): qty 1

## 2022-12-04 MED ORDER — NITROGLYCERIN 0.4 MG SL SUBL: 0.4000 mg | SUBLINGUAL_TABLET | SUBLINGUAL | Status: DC | PRN

## 2022-12-04 MED ORDER — FUROSEMIDE 10 MG/ML IJ SOLN
60.0000 mg | Freq: Two times a day (BID) | INTRAMUSCULAR | Status: DC
  Administered 2022-12-04 – 2022-12-05 (×2): 60 mg via INTRAVENOUS
  Filled 2022-12-04 (×2): qty 6

## 2022-12-04 MED ORDER — CLOPIDOGREL BISULFATE 75 MG PO TABS
75.0000 mg | ORAL_TABLET | Freq: Every day | ORAL | Status: DC
  Administered 2022-12-04 – 2022-12-05 (×2): 75 mg via ORAL
  Filled 2022-12-04 (×2): qty 1

## 2022-12-04 MED ORDER — POTASSIUM CHLORIDE CRYS ER 20 MEQ PO TBCR
40.0000 meq | EXTENDED_RELEASE_TABLET | Freq: Once | ORAL | Status: AC
  Administered 2022-12-04: 40 meq via ORAL
  Filled 2022-12-04: qty 2

## 2022-12-04 NOTE — Progress Notes (Signed)
PROGRESS NOTE    Patient: Debbie [name]                            PCP: Richardean Chimera, MD                    DOB: 1941-04-07            DOA: 12/03/2022 [id number]             DOS: 12/04/2022, 11:34 AM   LOS: 0 days   Date of Service: The patient was seen and examined on 12/04/2022  Subjective:   The patient was seen and examined this morning. Hemodynamically stable. Still complaining shortness of breath reporting improved lower extremity edema, denies any chest pain  Brief Narrative:   Debbie [name] is a 82 y.o. female with medical history significant of hyperlipidemia, GERD, CAD s/p CABG, hypertension, hypothyroidism who presents to the emergency department due to shortness of breath that has been ongoing for about a year but which worsened within the last 2 to 3 weeks.  She went to her cardiologist yesterday for shortness of breath evaluation and complaint of weakness in the morning that has been consistent with low BP in the mornings.  She complained of bilateral leg swelling despite taking Lasix 80 mg twice daily at home (she was prescribed 120 mg Lasix daily).  Yesterday, she states that she was unable to get out of bed due to weakness and also complained of tightness around her chest with symptoms similar to prior MI.   ED Course:  In the emergency department, she was hemodynamically stable.  Workup in the ED showed normal CBC and BMP except for blood glucose of 112, troponin x 2 was flat at 29, D-dimer 0.70, BNP 710, urinalysis was unimpressive for UTI.  SARS coronavirus 2 was negative. Chest x-ray shows small left pleural effusion CT chest without contrast showed no evidence of active pulmonary disease She was treated with Tylenol, Lasix and Robaxin. Hospitalist was asked to admit patient for further evaluation and management      Principal Problem:   CHF, acute on chronic (HCC) Active Problems:   Acquired hypothyroidism   Multiple vessel coronary artery  disease => CABG (LIMA-dLAD, SVG-Diag, Seq SVG-OM-OM-PDA)   Essential hypertension   GERD without esophagitis   Mixed hyperlipidemia   Acute on chronic systolic CHF Continue total input/output, daily weights and fluid restriction IV Lasix 80 mg x 1 was given in the ED with an output of 600 mL of urine Continue IV Lasix 60 mg twice daily with holding parameters Zebeta held at this time due to bradycardia Continue Cardiac diet  Echocardiogram in the morning    Essential hypertension Continue IV Lasix as ordered above with holding parameters   Mixed hyperlipidemia Continue Lipitor, Zetia   Acquired hypothyroidism Continue Synthroid   GERD Continue Protonix   CAD s/p CABG Continue Plavix, Lipitor Imdur temporarily held at this time due to low BP   -------------------------------------------------------------------------------------------------------------------------------- Nutritional status:  The patient's BMI is: Body mass index is 22.06 kg/m. I agree with the assessment and plan as outlined ----------------------------------------------------------------------------------------------------------------------------  DVT prophylaxis:  SCDs Start: 12/04/22 0546   Code Status:   Code Status: Full Code  Family Communication: No family member present at bedside- attempt will be made to update daily The above findings and plan of care has been discussed with patient (and family)  in detail,  they expressed understanding  and agreement of above. -Advance care planning has been discussed.   Admission status:   Status is: Inpatient Remains inpatient appropriate because: Needing IV diuretics, consultant evaluation for CHF exacerbation   Disposition: From  - home             Planning for discharge in 1-2 days: to   Procedures:   No admission procedures for hospital encounter.   Antimicrobials:  Anti-infectives (From admission, onward)    None        Medication:    atorvastatin  80 mg Oral QHS   clopidogrel  75 mg Oral Daily   ezetimibe  10 mg Oral Daily   furosemide  60 mg Intravenous Q12H   isosorbide mononitrate  30 mg Oral Daily   levothyroxine  88 mcg Oral Daily   pantoprazole  40 mg Oral Daily    nitroGLYCERIN   Objective:   Vitals:   12/04/22 0106 12/04/22 0451 12/04/22 0500 12/04/22 1047  BP: 132/85 116/68  122/61  Pulse: 62 60  (!) 47  Resp: 18 17  16   Temp: 98.9 F (37.2 C) 98.2 F (36.8 C)  98.3 F (36.8 C)  TempSrc: Oral Oral    SpO2: 98% 97%  99%  Weight:   56.5 kg   Height:        Intake/Output Summary (Last 24 hours) at 12/04/2022 1134 Last data filed at 12/04/2022 0900 Gross per 24 hour  Intake 360 ml  Output 1000 ml  Net -640 ml   Filed Weights   12/03/22 1832 12/04/22 0101 12/04/22 0500  Weight: 58.3 kg 56.5 kg 56.5 kg     Physical examination:   Constitution:  Alert, cooperative, no distress,  Appears calm and comfortable  Psychiatric:   Normal and stable mood and affect, cognition intact,   HEENT:        Normocephalic, PERRL, otherwise with in Normal limits  Chest:         Chest symmetric Cardio vascular:  S1/S2, RRR, No murmure, No Rubs or Gallops  pulmonary: Clear to auscultation bilaterally, respirations unlabored, negative wheezes / crackles Abdomen: Soft, non-tender, non-distended, bowel sounds,no masses, no organomegaly Muscular skeletal: Limited exam - in bed, able to move all 4 extremities,   Neuro: CNII-XII intact. , normal motor and sensation, reflexes intact  Extremities: ++2  pitting edema lower extremities, +2 pulses  Skin: Dry, warm to touch, negative for any Rashes, No open wounds Wounds: per nursing documentation   ------------------------------------------------------------------------------------------------------------------------------------------    LABs:     Latest Ref Rng & Units 12/04/2022    6:02 AM 12/03/2022    7:25 PM 12/06/2021    5:27 AM  CBC  WBC 4.0 - 10.5 K/uL  7.1  7.6  7.8   Hemoglobin 12.0 - 15.0 g/dL 32.4  40.1  8.2   Hematocrit 36.0 - 46.0 % 36.8  39.4  26.1   Platelets 150 - 400 K/uL 343  366  520       Latest Ref Rng & Units 12/04/2022    6:02 AM 12/03/2022    7:25 PM 12/07/2021    5:21 AM  CMP  Glucose 70 - 99 mg/dL 027  253  664   BUN 8 - 23 mg/dL 10  11  9    Creatinine 0.44 - 1.00 mg/dL 4.03  4.74  2.59   Sodium 135 - 145 mmol/L 139  137  139   Potassium 3.5 - 5.1 mmol/L 3.2  3.5  3.5   Chloride 98 -  111 mmol/L 104  103  105   CO2 22 - 32 mmol/L 30  25  27    Calcium 8.9 - 10.3 mg/dL 9.1  9.2  8.8   Total Protein 6.5 - 8.1 g/dL 5.8     Total Bilirubin 0.3 - 1.2 mg/dL 0.8     Alkaline Phos 38 - 126 U/L 85     AST 15 - 41 U/L 15     ALT 0 - 44 U/L 14          Micro Results Recent Results (from the past 240 hour(s))  SARS Coronavirus 2 by RT PCR (hospital order, performed in [facility] hospital lab) *cepheid single result test* Urine, Clean Catch     Status: None   Collection Time: 12/03/22 10:57 PM   Specimen: Urine, Clean Catch; Nasal Swab  Result Value Ref Range Status   SARS Coronavirus 2 by RT PCR NEGATIVE NEGATIVE Final    Comment: (NOTE) SARS-CoV-2 target nucleic acids are NOT DETECTED.  The SARS-CoV-2 RNA is generally detectable in upper and lower respiratory specimens during the acute phase of infection. The lowest concentration of SARS-CoV-2 viral copies this assay can detect is 250 copies / mL. A negative result does not preclude SARS-CoV-2 infection and should not be used as the sole basis for treatment or other patient management decisions.  A negative result may occur with improper specimen collection / handling, submission of specimen other than nasopharyngeal swab, presence of viral mutation(s) within the areas targeted by this assay, and inadequate number of viral copies (<250 copies / mL). A negative result must be combined with clinical observations, patient history, and epidemiological  information.  Fact Sheet for Patients:   RoadLapTop.co.za  Fact Sheet for Healthcare Providers: http://kim-miller.com/  This test is not yet approved or  cleared by the [place] FDA and has been authorized for detection and/or diagnosis of SARS-CoV-2 by FDA under an Emergency Use Authorization (EUA).  This EUA will remain in effect (meaning this test can be used) for the duration of the COVID-19 declaration under Section 564(b)(1) of the Act, 21 U.S.C. section 360bbb-3(b)(1), unless the authorization is terminated or revoked sooner.  Performed at [facility], [address], [place], [place] [address]     Radiology Reports CT Chest Wo Contrast  Result Date: 12/03/2022 CLINICAL DATA:  Chronic dyspnea of unclear etiology. Decreased energy over the past year. Swelling in the feet. Pleural effusion on x-ray. IV contrast allergy. EXAM: CT CHEST WITHOUT CONTRAST TECHNIQUE: Multidetector CT imaging of the chest was performed following the standard protocol without IV contrast. RADIATION DOSE REDUCTION: This exam was performed according to the departmental dose-optimization program which includes automated exposure control, adjustment of the mA and/or kV according to patient size and/or use of iterative reconstruction technique. COMPARISON:  Chest radiograph 12/03/2022 FINDINGS: Cardiovascular: Normal heart size. No pericardial effusion. Normal caliber thoracic aorta. Calcification of the aorta and coronary arteries. Postoperative changes in the mediastinum consistent with coronary bypass. Mediastinum/Nodes: Thyroid gland is unremarkable. Esophagus is decompressed. Moderate-sized esophageal hiatal hernia. No significant lymphadenopathy. Lungs/Pleura: Mild dependent atelectasis in the lung bases. No airspace disease or consolidation. No interstitial infiltration. Bronchial wall thickening suggesting chronic bronchitis. No pleural effusions. No  pneumothorax. Upper Abdomen: No acute abnormality. Musculoskeletal: Degenerative changes in the spine. Sternotomy wires. IMPRESSION: 1. No evidence of active pulmonary disease. 2. Coronary artery and aortic calcification post coronary bypass. 3. Moderate-sized esophageal hiatal hernia. Electronically Signed   By: Marisa Cyphers.D.  On: 12/03/2022 23:37   DG Chest 2 View  Result Date: 12/03/2022 CLINICAL DATA:  Pedal edema EXAM: CHEST - 2 VIEW COMPARISON:  Radiograph 12/05/2021 FINDINGS: Stable enlargement of the cardiomediastinal silhouette. Sternotomy and CABG. Aortic atherosclerotic calcification. Hyperinflation and chronic bronchitic changes. Small left pleural effusion. No focal consolidation pneumothorax. IMPRESSION: Small left pleural effusion. Emphysema. Cardiomegaly. Electronically Signed   By: Minerva Fester M.D.   On: 12/03/2022 19:54    SIGNED: Kendell Bane, MD, FHM. FAAFP. [facility] - [facility] Time spent - 35 min.  In seeing, evaluating and examining the patient. Reviewing medical records, labs, drawn plan of care. [facility],  Pager (please use [email or web address] to page/ text) Please use Epic Secure Chat for non-urgent communication (7AM-7PM)  If 7PM-7AM, please contact night-coverage [email or web address], 12/04/2022, 11:34 AM

## 2022-12-04 NOTE — Plan of Care (Signed)
  Problem: Acute Rehab PT Goals(only PT should resolve) Goal: Pt Will Go Supine/Side To Sit Outcome: Progressing Flowsheets (Taken 12/04/2022 1208) Pt will go Supine/Side to Sit: with modified independence Goal: Patient Will Transfer Sit To/From Stand Outcome: Progressing Flowsheets (Taken 12/04/2022 1208) Patient will transfer sit to/from stand: with modified independence Goal: Pt Will Transfer Bed To Chair/Chair To Bed Outcome: Progressing Flowsheets (Taken 12/04/2022 1208) Pt will Transfer Bed to Chair/Chair to Bed: with modified independence Goal: Pt Will Ambulate Outcome: Progressing Flowsheets (Taken 12/04/2022 1208) Pt will Ambulate: 100 feet   Debbie [name] SPT [place], DPT Program

## 2022-12-04 NOTE — Progress Notes (Signed)
Patient admitted this shift to 300 floor. Patient slept through the night. Morning vitals taken, Patient had lasix 60mg  due, consulted Dr.Adefeso, Patient BP 116/68 pulse 60, informed to hold medication. Continuing to monitor.

## 2022-12-04 NOTE — Hospital Course (Addendum)
Debbie [name] is a 82 y.o. female with medical history significant of hyperlipidemia, GERD, CAD s/p CABG, hypertension, hypothyroidism who presents to the emergency department due to shortness of breath that has been ongoing for about a year but which worsened within the last 2 to 3 weeks.  She went to her cardiologist yesterday for shortness of breath evaluation and complaint of weakness in the morning that has been consistent with low BP in the mornings.  She complained of bilateral leg swelling despite taking Lasix 80 mg twice daily at home (she was prescribed 120 mg Lasix daily).  Yesterday, she states that she was unable to get out of bed due to weakness and also complained of tightness around her chest with symptoms similar to prior MI.   ED Course:  In the emergency department, she was hemodynamically stable.  Workup in the ED showed normal CBC and BMP except for blood glucose of 112, troponin x 2 was flat at 29, D-dimer 0.70, BNP 710, urinalysis was unimpressive for UTI.  SARS coronavirus 2 was negative. Chest x-ray shows small left pleural effusion CT chest without contrast showed no evidence of active pulmonary disease She was treated with Tylenol, Lasix and Robaxin. Hospitalist was asked to admit patient for further evaluation and management      Assessment & Plan:   Principal Problem:   CHF, acute on chronic (HCC) Active Problems:   Acquired hypothyroidism   Multiple vessel coronary artery disease => CABG (LIMA-dLAD, SVG-Diag, Seq SVG-OM-OM-PDA)   Essential hypertension   GERD without esophagitis   Mixed hyperlipidemia      Acute on chronic systolic CHF - EF was previously at 25-30% by echo in 11/2021, normalized to 55-60% by echo in 02/2022. - BNP elevated at 710 on admission  -  CXR showed a small left pleural effusion.  - Continue total input/output, daily weights and fluid restriction IV Lasix 80 mg x 1 was given in the ED with an output of 600 mL of  urine Continue IV Lasix 60 mg twice daily with holding parameters Zebeta held at this time due to bradycardia Continue Cardiac diet  Echocardiogram:  -Cardiology consulted, appreciate further evaluation recommendations  -Previously patient did not qualify for GDMT-due to bradycardia, hypotension (currently not a candidate for ACEi/ARB/ARNI or/MRA, did not tolerate SGLT2 inhibitors due to recurrent cystitis  Intake/Output Summary (Last 24 hours) at 12/04/2022 1136 Last data filed at 12/04/2022 0900 Gross per 24 hour  Intake 360 ml  Output 1000 ml  Net -640 ml   Filed Weights   12/03/22 1832 12/04/22 0101 12/04/22 0500  Weight: 58.3 kg 56.5 kg 56.5 kg     Essential hypertension Continue IV Lasix as ordered above with holding parameters   Mixed hyperlipidemia Continue Lipitor, Zetia   Acquired hypothyroidism Continue Synthroid   GERD Continue Protonix   CAD s/p CABG 2006-non-STEMI 11/2021 11/2021 with cath showing patent LIMA-LAD and occlusion of all vein grafts --> underwent DES to LM and LCx and staged DES to SVG-D1.  Continue Plavix, atorvastatin, Zetia, Imdur, not on beta-blocker due to bradycardia  Imdur temporarily held at this time due to low BP

## 2022-12-04 NOTE — Progress Notes (Signed)
*  PRELIMINARY RESULTS* Echocardiogram 2D Echocardiogram has been performed.  Stacey Drain 12/04/2022, 11:50 AM

## 2022-12-04 NOTE — TOC Initial Note (Signed)
Transition of Care [facility]) - Initial/Assessment Note    Patient Details  Name: Debbie [name] MRN: [id number] Date of Birth: Jun 14, 1940  Transition of Care [facility] CM/SW Contact:    Leitha Bleak, RN Phone Number: 12/04/2022, 1:43 PM  Clinical Narrative:     Patient admitted with CHF. PT is recommending HHPT. Patientis agreeable. Patient waiting on ECHO. Patient agreeable with [facility]. [facility] accepted. MD ordered. TOC following.               Expected Discharge Plan: Home w Home Health Services Barriers to Discharge: Continued Medical Work up   Patient Goals and CMS Choice Patient states their goals for this hospitalization and ongoing recovery are:: agreeable to HHPT CMS Medicare.gov Compare Post Acute Care list provided to:: Patient       Expected Discharge Plan and Services       Living arrangements for the past 2 months: Single Family Home                             HH Agency: [facility] Date [facility] Agency Contacted: 12/04/22 Time HH Agency Contacted: [phone] Representative spoke with at [facility] Agency: [facility]ust[facility]  Prior Living Arrangements/Services Living arrangements for the past 2 months: Single Family Home     Do you feel safe going back to the place where you live?: Yes               Activities of Daily Living Home Assistive Devices/Equipment: Cane (specify quad or straight) ADL Screening (condition at time of admission) Patient's cognitive ability adequate to safely complete daily activities?: Yes Is the patient deaf or have difficulty hearing?: No Does the patient have difficulty seeing, even when wearing glasses/contacts?: No Does the patient have difficulty concentrating, remembering, or making decisions?: No Patient able to express need for assistance with ADLs?: Yes Does the patient have difficulty dressing or bathing?: No Independently performs ADLs?: Yes (appropriate for developmental age) Does the patient have difficulty walking or  climbing stairs?: Yes Weakness of Legs: Both Weakness of Arms/Hands: None  Permission Sought/Granted      Emotional Assessment       Orientation: : Oriented to Self, Oriented to Place, Oriented to  Time, Oriented to Situation Alcohol / Substance Use: Not Applicable Psych Involvement: No (comment)  Admission diagnosis:  Generalized weakness [R53.1] CHF, acute on chronic (HCC) [I50.9] Acute on chronic congestive heart failure, unspecified heart failure type (HCC) [I50.9] Patient Active Problem List   Diagnosis Date Noted   CHF, acute on chronic (HCC) 12/04/2022   Acute HFrEF (heart failure with reduced ejection fraction) (HCC) 12/07/2021   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   Atelectasis 12/05/2021   Ischemic cardiomyopathy 12/02/2021    Class: Acute   Acute on chronic combined systolic and diastolic CHF (congestive heart failure) (HCC)     Class: Diagnosis of   LV (left ventricular) mural thrombus 11/28/2021    Class: Diagnosis of   Hematuria 11/28/2021   ACS (acute coronary syndrome) (HCC)    Status post coronary artery stent placement 11/23/2021    Class: Acute   NSTEMI (non-ST elevated myocardial infarction) (HCC) 11/22/2021    Class: Hospitalized for   Acute metabolic encephalopathy 10/18/2021   Bradycardia 10/18/2021   Dizziness 10/18/2021   Incomplete bladder emptying 02/07/2021   OAB (overactive bladder) 02/07/2021   COPD (chronic obstructive pulmonary disease) (HCC) 01/02/2019   Edema 12/31/2018   Generalized weakness 03/31/2017   Headache 11/04/2016  Bereavement due to life event 11/04/2016   Hypokalemia 09/20/2014   Carotid artery disease (HCC)    Multiple vessel coronary artery disease => CABG (LIMA-dLAD, SVG-Diag, Seq SVG-OM-OM-PDA)     Class: History of   Essential hypertension    Acquired hypothyroidism    GERD without esophagitis    Allergic rhinitis    Anxiety    Mixed hyperlipidemia    Hx of CABG x 5     Class: History of   Ejection fraction     Tricuspid regurgitation    PCP:  Richardean Chimera, MD Pharmacy:   [facility] [address], [place] - [address] [place] [address] Phone: [phone] Fax: [phone]  [facility] [place], [place] - [address] [place] [address] Phone: [phone] Fax: [phone]  [facility] [place], [place] - [address] [place] [address] Phone: [phone] Fax: [phone]     Social Determinants of Health (SDOH) Social History: SDOH Screenings   Food Insecurity: No Food Insecurity (12/04/2022)  Housing: Low Risk  (12/04/2022)  Transportation Needs: No Transportation Needs (12/04/2022)  Utilities: Not At Risk (12/04/2022)  Financial Resource Strain: Low Risk  (11/28/2022)  Physical Activity: Inactive (09/17/2022)  Social Connections: Unknown (12/31/2018)  Stress: Unknown (12/31/2018)  Tobacco Use: Low Risk  (11/28/2022)   SDOH Interventions:    Readmission Risk Interventions    12/04/2022    1:41 PM 11/29/2021    3:17 PM  Readmission Risk Prevention Plan  Post Dischage Appt Not Complete   Medication Screening Complete   Transportation Screening Complete Complete  PCP or Specialist Appt within 5-7 Days  Complete  Home Care Screening  Complete  Medication Review (RN CM)  Complete

## 2022-12-04 NOTE — Consult Note (Addendum)
Cardiology Consultation   Patient ID: Debbie [name] MRN: [id number]; DOB: [birth year]  Admit date: 12/03/2022 Date of Consult: 12/04/2022  PCP:  Richardean Chimera, MD   [facility] Providers Cardiologist:  [name], MD        Patient Profile:   Debbie [name] is a 82 y.o. 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 Thomes Dinning.  History of Present Illness:   Debbie [name] was examined in clinic by Sharlene Dory, NP on 12/03/2022 and reported worsening dyspnea on exertion and lower extremity weakness for the past 2 to 3 days. Reported episodes of hypotension with SBP in the 70's at times. Orthostatic vitals were checked in the office and negative.  Her weight was overall stable at 128 lbs but given her significant weakness, dyspnea on exertion and 2+ pitting edema on examination, she was sent to the ED with plans for admission.  Initial labs showed WBC 7.6, Hgb 12.3, platelets 366, Na+ 137, K+ 3.5 and creatinine 1.00. BNP elevated to 710. D-dimer elevated to 0.70. Initial and repeat Hs Troponin values flat at 29. Negative for COVID. CXR showed a small left pleural effusion with emphysema and cardiomegaly. CT Chest showed no evidence of active pulmonary disease but was noted to have coronary artery and aortic calcification along with a moderate-sized esophageal hiatal hernia. EKG showed sinus bradycardia, HR 59 with slight ST depression along the inferior and lateral leads which is similar to prior tracings.   She was admitted for an acute CHF exacerbation and received 80mg  IV Lasix while in the ED and has been started on IV Lasix 60mg   BID (was taking 80mg  daily up to 120mg  daily at home prior to admission). Repeat labs this AM show K+ is low at 3.2. Creatinine improved to 0.89.  In talking with the patient today, she reports worsening dyspnea on exertion and at rest for the past 2-3 weeks. Notes orthopnea and PND. Says she has felt "full" in her chest but no specific pain or pressure. Weight has only changed by 2 lbs on her home scales but she has experienced worsening lower extremity edema as well. Has been compliant with her medications but says she becomes really weak after taking them.    Past Medical History:  Diagnosis Date   Allergic rhinitis    Anxiety    Arthritis    Asthma    Bladder spasms    Carotid artery disease (HCC)    Collagen vascular disease (HCC)    COPD (chronic obstructive pulmonary disease) (HCC)    Cystocele    Essential hypertension    GERD (gastroesophageal reflux disease)    Hyperlipidemia    Hypothyroidism    Ischemic cardiomyopathy 12/02/2021   Echo 11/27/2021:     Multiple vessel coronary artery disease => CABG (LIMA-dLAD, SVG-Diag, Seq SVG-OM-OM-PDA)    a. s/p CABG in 2006 b. NSTEMI in 11/2021 with DES to LM and LCx and repeat cath later in admission with DES to SVG-D1   NSTEMI (non-ST elevated myocardial infarction) (HCC) 11/22/2021   Osteoporosis    Renal insufficiency    Vaginal  vault prolapse     Past Surgical History:  Procedure Laterality Date   ANTERIOR AND POSTERIOR REPAIR N/A 04/06/2013   Procedure: CYSTOCELE REPAIR ;  Surgeon: Martina Sinner, MD;  Location: [facility] ORS;  Service: Urology;  Laterality: N/A;   CATARACTS     REMOVED   COLONOSCOPY     CORNEA LACERATION REPAIR     CORONARY ARTERY BYPASS GRAFT     X 5 VESSELS   CORONARY STENT INTERVENTION N/A 11/23/2021   Procedure: CORONARY STENT INTERVENTION;  Surgeon: Tonny Bollman, MD;  Location: [facility] INVASIVE CV LAB;  Service: Cardiovascular;  Laterality: N/A;   CORONARY/GRAFT ACUTE MI REVASCULARIZATION N/A 11/24/2021    Procedure: Coronary/Graft Acute MI Revascularization;  Surgeon: Runell Gess, MD;  Location: [facility] INVASIVE CV LAB;  Service: Cardiovascular;  Laterality: N/A;   CYSTOSCOPY N/A 04/06/2013   Procedure: CYSTOSCOPY;  Surgeon: Martina Sinner, MD;  Location: [facility]S;  Service: Urology;  Laterality: N/A;   HERNIA REPAIR     LEFT HEART CATH AND CORONARY ANGIOGRAPHY N/A 11/24/2021   Procedure: LEFT HEART CATH AND CORONARY ANGIOGRAPHY;  Surgeon: Runell Gess, MD;  Location: [facility] INVASIVE CV LAB;  Service: Cardiovascular;  Laterality: N/A;   LEFT HEART CATH AND CORS/GRAFTS ANGIOGRAPHY N/A 11/23/2021   Procedure: LEFT HEART CATH AND CORS/GRAFTS ANGIOGRAPHY;  Surgeon: Tonny Bollman, MD;  Location: [facility] INVASIVE CV LAB;  Service: Cardiovascular;  Laterality: N/A;   SEPTOPLASTY N/A 04/28/2015   Procedure: SEPTOPLASTY;  Surgeon: Melvenia Beam, MD;  Location: [facility] OR;  Service: ENT;  Laterality: N/A;   SINUS ENDO W/FUSION Bilateral 04/28/2015   Procedure: ENDOSCOPIC SINUS SURGERY WITH NAVIGATION;  Surgeon: Melvenia Beam, MD;  Location: [facility] OR;  Service: ENT;  Laterality: Bilateral;   TUBAL LIGATION       Home Medications:  Prior to Admission medications   Medication Sig Start Date End Date Taking? Authorizing Provider  acetaminophen (TYLENOL) 650 MG CR tablet Take 650 mg by mouth in the morning.   Yes [provider]  atorvastatin (LIPITOR) 80 MG tablet Take 1 tablet (80 mg total) by mouth at bedtime. 01/03/19  Yes Emokpae, Courage, MD  bisoprolol (ZEBETA) 5 MG tablet Take 0.5 tablets (2.5 mg total) by mouth daily. 12/08/21  Yes Johnson, Clanford L, MD  clopidogrel (PLAVIX) 75 MG tablet Take 1 tablet (75 mg total) by mouth daily. 03/26/22  Yes Jonelle Sidle, MD  famotidine (PEPCID) 20 MG tablet Take 40 mg by mouth at bedtime. 08/18/18  Yes [provider]  furosemide (LASIX) 80 MG tablet Take 1 tablet (80 mg total) by mouth daily. Patient taking differently: Take 80 mg by mouth 2 (two)  times daily. 04/17/22 04/12/23 Yes Strader, Lennart Pall, PA-C  isosorbide mononitrate (IMDUR) 30 MG 24 hr tablet Take 1 tablet (30 mg total) by mouth daily. 12/08/21  Yes Johnson, Clanford L, MD  levocetirizine (XYZAL) 5 MG tablet Take 5 mg by mouth every evening. Equate levocetirizine.   Yes [provider]  levothyroxine (SYNTHROID) 88 MCG tablet Take 88 mcg by mouth daily. 09/04/18  Yes [provider]  nitroGLYCERIN (NITROSTAT) 0.4 MG SL tablet Place 1 tablet (0.4 mg total) under the tongue every 5 (five) minutes x 3 doses as needed for chest pain (if no relief after 3rd dose, call 911 or proceed to the ED for an evaluation). 08/06/19  Yes Netta Neat., NP  ondansetron (ZOFRAN) 4 MG tablet Take 1 tablet (4 mg total) by mouth every 6 (six)  hours as needed for nausea. 10/20/21  Yes Shah, Pratik D, DO  pantoprazole (PROTONIX) 40 MG tablet Take 40 mg by mouth daily. 10/21/21  Yes [provider]  potassium chloride (KLOR-CON M) 10 MEQ tablet Take 1 tablet (10 mEq total) by mouth daily. Patient taking differently: Take 20 mEq by mouth daily. 12/07/21  Yes Johnson, Clanford L, MD  tamsulosin (FLOMAX) 0.4 MG CAPS capsule Take 0.4 mg by mouth.   Yes [provider]  cephALEXin (KEFLEX) 500 MG capsule Take 500 mg by mouth 3 (three) times daily. Patient not taking: Reported on 12/04/2022 04/19/22   [provider]  ezetimibe (ZETIA) 10 MG tablet Take 1 tablet (10 mg total) by mouth daily. Patient not taking: Reported on 12/04/2022 11/29/21   [name], PA    Inpatient Medications: Scheduled Meds:  furosemide  60 mg Intravenous Q12H   potassium chloride  40 mEq Oral Once   Continuous Infusions:  PRN Meds:   Allergies:    Allergies  Allergen Reactions   Ivp Dye [Iodinated Contrast Media] Swelling    Kidney Dye   Betadine [Povidone Iodine] Rash   Codeine Nausea And Vomiting   Other Nausea And Vomiting    Narcotics - nausea,vomiting    Penicillins Rash    Did it involve swelling of the face/tongue/throat, SOB, or low BP? No Did it involve sudden or severe rash/hives, skin peeling, or any reaction on the inside of your mouth or nose? No Did you need to seek medical attention at a hospital or doctor's office? No When did it last happen?       If all above answers are "NO", may proceed with cephalosporin use.    Sulfa Antibiotics Rash    Social History:   Social History   Socioeconomic History   Marital status: Legally Separated    Spouse name: Not on file   Number of children: 5   Years of education: Not on file   Highest education level: Not on file  Occupational History   Occupation: Retired  Tobacco Use   Smoking status: Never   Smokeless tobacco: Never  Vaping Use   Vaping status: Never Used  Substance and Sexual Activity   Alcohol use: Yes    Alcohol/week: 0.0 standard drinks of alcohol    Comment: 1 drink 1 time a year   Drug use: No   Sexual activity: Not on file  Other Topics Concern   Not on file  Social History Narrative   Not on file   Social Determinants of Health   Financial Resource Strain: Low Risk  (11/28/2022)   Overall Financial Resource Strain (CARDIA)    Difficulty of Paying Living Expenses: Not very hard  Food Insecurity: No Food Insecurity (12/04/2022)   Hunger Vital Sign    Worried About Running Out of Food in the Last Year: Never true    Ran Out of Food in the Last Year: Never true  Transportation Needs: No Transportation Needs (12/04/2022)   PRAPARE - Administrator, Civil Service (Medical): No    Lack of Transportation (Non-Medical): No  Physical Activity: Inactive (09/17/2022)   Exercise Vital Sign    Days of Exercise per Week: 0 days    Minutes of Exercise per Session: 0 min  Stress: Unknown (12/31/2018)   [employer] of Occupational Health - Occupational Stress Questionnaire    Feeling of Stress : Patient declined  Social Connections: Unknown  (12/31/2018)   Social Connection and Isolation  Panel [NHANES]    Frequency of Communication with Friends and Family: Patient declined    Frequency of Social Gatherings with Friends and Family: Patient declined    Attends Religious Services: Patient declined    Database administrator or Organizations: Patient declined    Attends Banker Meetings: Patient declined    Marital Status: Patient declined  Intimate Partner Violence: Patient Declined (12/04/2022)   Humiliation, Afraid, Rape, and Kick questionnaire    Fear of Current or Ex-Partner: Patient declined    Emotionally Abused: Patient declined    Physically Abused: Patient declined    Sexually Abused: Patient declined    Family History:    Family History  Problem Relation Age of Onset   Cirrhosis Father    Lung disease Father    Diabetes Mellitus II Mother    Heart Problems Mother      ROS:  Please see the history of present illness.   All other ROS reviewed and negative.     Physical Exam/Data:   Vitals:   12/04/22 0101 12/04/22 0106 12/04/22 0451 12/04/22 0500  BP:  132/85 116/68   Pulse:  62 60   Resp:  18 17   Temp:  98.9 F (37.2 C) 98.2 F (36.8 C)   TempSrc:  Oral Oral   SpO2:  98% 97%   Weight: 56.5 kg   56.5 kg  Height: 5\' 3"  (1.6 m)       Intake/Output Summary (Last 24 hours) at 12/04/2022 0758 Last data filed at 12/04/2022 0618 Gross per 24 hour  Intake 240 ml  Output 1000 ml  Net -760 ml      12/04/2022    5:00 AM 12/04/2022    1:01 AM 12/03/2022    6:32 PM  Last 3 Weights  Weight (lbs) 124 lb 9 oz 124 lb 9 oz 128 lb 9.6 oz  Weight (kg) 56.5 kg 56.5 kg 58.333 kg     Body mass index is 22.06 kg/m.  General: Pleasant, elderly female appearing in no acute distress. HEENT: normal Neck: JVD at 10-11 cm.  Vascular: No carotid bruits; Distal pulses 2+ bilaterally Cardiac:  normal S1, S2; RRR; no murmur  Lungs: decreased breath sounds along bases bilaterally. Wheezing along upper lung  fields.  Abd: soft, nontender, no hepatomegaly  Ext: Trace ankle edema bilaterally.  Musculoskeletal:  No deformities, BUE and BLE strength normal and equal Skin: warm and dry  Neuro:  CNs 2-12 intact, no focal abnormalities noted Psych:  Normal affect   EKG:  The EKG was personally reviewed and demonstrates: Sinus bradycardia, HR 59 with slight ST depression along the inferior and lateral leads which is similar to prior tracings.  Telemetry:  Telemetry was personally reviewed and demonstrates: Sinus bradycardia, HR in 40's to 50's.   Relevant CV Studies:  Limited Echo: 02/2022 IMPRESSIONS     1. Limited study.   2. Left ventricular ejection fraction, by estimation, is 55 to 60%. The  left ventricle has normal function. The left ventricle demonstrates  regional wall motion abnormalities (see scoring diagram/findings for  description).   3. Definity contrast defines loosely organized thormbotic material and  slow flow at LV apex, but no clearly formed mural thrombus.   4. Right ventricular systolic function is normal. The right ventricular  size is normal.   5. The aortic valve is tricuspid. Aortic valve regurgitation is not  visualized.   Comparison(s): Prior images reviewed side by side. LVEF has improved and  although  LV apical thrombotic material and slow flow are noted, there is  no clearly formed mural thrombus.   Laboratory Data:  High Sensitivity Troponin:   Recent Labs  Lab 12/03/22 1925 12/03/22 2115  TROPONINIHS 29* 29*     Chemistry Recent Labs  Lab 12/03/22 1925 12/04/22 0602  NA 137 139  K 3.5 3.2*  CL 103 104  CO2 25 30  GLUCOSE 112* 106*  BUN 11 10  CREATININE 1.00 0.89  CALCIUM 9.2 9.1  MG  --  2.0  GFRNONAA 57* >60  ANIONGAP 9 5    Recent Labs  Lab 12/04/22 0602  PROT 5.8*  ALBUMIN 3.2*  AST 15  ALT 14  ALKPHOS 85  BILITOT 0.8   Lipids No results for input(s): "CHOL", "TRIG", "HDL", "LABVLDL", "LDLCALC", "CHOLHDL" in the last 168  hours.  Hematology Recent Labs  Lab 12/03/22 1925 12/04/22 0602  WBC 7.6 7.1  RBC 4.78 4.54  HGB 12.3 11.8*  HCT 39.4 36.8  MCV 82.4 81.1  MCH 25.7* 26.0  MCHC 31.2 32.1  RDW 14.8 14.9  PLT 366 343   Thyroid No results for input(s): "TSH", "FREET4" in the last 168 hours.  BNP Recent Labs  Lab 12/03/22 1835  BNP 710.0*    DDimer  Recent Labs  Lab 12/03/22 1925  DDIMER 0.70*     Radiology/Studies:  CT Chest Wo Contrast  Result Date: 12/03/2022 CLINICAL DATA:  Chronic dyspnea of unclear etiology. Decreased energy over the past year. Swelling in the feet. Pleural effusion on x-ray. IV contrast allergy. EXAM: CT CHEST WITHOUT CONTRAST TECHNIQUE: Multidetector CT imaging of the chest was performed following the standard protocol without IV contrast. RADIATION DOSE REDUCTION: This exam was performed according to the departmental dose-optimization program which includes automated exposure control, adjustment of the mA and/or kV according to patient size and/or use of iterative reconstruction technique. COMPARISON:  Chest radiograph 12/03/2022 FINDINGS: Cardiovascular: Normal heart size. No pericardial effusion. Normal caliber thoracic aorta. Calcification of the aorta and coronary arteries. Postoperative changes in the mediastinum consistent with coronary bypass. Mediastinum/Nodes: Thyroid gland is unremarkable. Esophagus is decompressed. Moderate-sized esophageal hiatal hernia. No significant lymphadenopathy. Lungs/Pleura: Mild dependent atelectasis in the lung bases. No airspace disease or consolidation. No interstitial infiltration. Bronchial wall thickening suggesting chronic bronchitis. No pleural effusions. No pneumothorax. Upper Abdomen: No acute abnormality. Musculoskeletal: Degenerative changes in the spine. Sternotomy wires. IMPRESSION: 1. No evidence of active pulmonary disease. 2. Coronary artery and aortic calcification post coronary bypass. 3. Moderate-sized esophageal hiatal  hernia. Electronically Signed   By: Burman Nieves M.D.   On: 12/03/2022 23:37   DG Chest 2 View  Result Date: 12/03/2022 CLINICAL DATA:  Pedal edema EXAM: CHEST - 2 VIEW COMPARISON:  Radiograph 12/05/2021 FINDINGS: Stable enlargement of the cardiomediastinal silhouette. Sternotomy and CABG. Aortic atherosclerotic calcification. Hyperinflation and chronic bronchitic changes. Small left pleural effusion. No focal consolidation pneumothorax. IMPRESSION: Small left pleural effusion. Emphysema. Cardiomegaly. Electronically Signed   By: Minerva Fester M.D.   On: 12/03/2022 19:54     Assessment and Plan:   1. Acute HFpEF  - Her EF was previously at 25-30% by echo in 11/2021, normalized to 55-60% by echo in 02/2022. BNP elevated at 710 on admission and CXR showed a small left pleural effusion.  - She has been started on IV Lasix 60mg  BID with a recorded net output of -760 mL after receiving one dose. Weight at 128 lbs on admission and 124 lbs today. Creatinine has also  improved from 1.00 on admission to 0.89 with diuresis. K+ was low at 3.2 today and supplementation has been ordered.  - Would continue with IV Lasix today. ReDS vest pending. Repeat echocardiogram also pending. If her EF is found to be reduced again, initiation of GDMT will be challenging as her HR is in the 40's to 50's which limits the use of a beta-blocker (was on Bisoprolol 2.5mg  daily prior to admission and currently held as this could have been contributing to her weakness). BP also previously did not allow for an ACE-I/ARB/ARNI/MRA. Was also previously intolerant to SGLT2 inhibitor therapy as she developed cystitis after initiation of this during a prior admission.   2. CAD/Elevated Troponin Values - She is s/p CABG in 2006 and did have an NSTEMI in 11/2021 with cath showing patent LIMA-LAD and occlusion of all vein grafts --> underwent DES to LM and LCx and staged DES to SVG-D1.  - She denies any specific chest pain resembling her  prior angina. Previously reported fullness in her chest but this is improving with diuresis.  - Hs Troponin values were flat at 29 which is most consistent with demand ischemia as compared to ACS. Continue current medical therapy with Plavix 75mg  daily, Atorvastatin 80mg  daily, Zetia 10mg  daily and Imdur 30mg  daily. Not on a beta-blocker due to bradycardia.  3. History of LV thrombus - This was resolved by most recent imaging in 02/2022. Repeat echo pending.  4. HLD - Followed by her PCP. Remains on Atorvastatin 80mg  daily and Zetia 10mg  daily.   5. COPD - She is wheezing on examination today. Reviewed with the admitting team as she would likely benefit from scheduled nebulizer treatments and a short-course of steroids.   For questions or updates, please contact [facility] Please consult [email or web address] for contact info under    Signed, Ellsworth Lennox, PA-C  12/04/2022 7:58 AM    Attending attestation  Patient seen and independently examined with Randall An, PA-C. We discussed all aspects of the encounter. I agree with the assessment and plan as stated above.  Patient is 82 year old F known to have CAD s/p CABG in 2006 (recent PCI in 7/23 after NSTEMI) with HFimpEF (LVEF 25 to 30% in 7/23 improved to 55 to 60% in 10/23), Hx of LV thrombus (resolved on repeat echo in 10/23), HTN, HLD, COPD presented to ER with DOE, orthopnea, PND and bilateral lower EXTR swelling x 2 weeks prior to presentation. She also had chest heaviness from DOE.  Also feeling extremely tired.  HR 40 to 50s on admission.  EKG on admission showed sinus bradycardia, HR 59 bpm.  BNP is elevated 710. Troponins mildly elevated, 29 and 29.  Overall, patient doing great.  Continues to have SOB but better since admission.  Physical examination is remarkable for patient not in acute respite distress, HEENT normal, mildly elevated JVD, S1-S2 normal, no murmur, clear lungs, abdomen NT and ND, no edema in bilateral  lower extremities.   For acute on chronic HFpEF, continue IV Lasix 60 mg every 12 hours, hold bisoprolol and obtain 2D echocardiogram.  For CAD, continue Plavix 75 mg once daily, atorvastatin 80 mg nightly and Zetia 10 mg once daily.  For severe weakness/fatigue, currently levothyroxine, will obtain TSH.  Clara Herbison Verne Spurr, MD [place]  [facility]  10:48 AM

## 2022-12-04 NOTE — Evaluation (Signed)
Physical Therapy Evaluation Patient Details Name: Debbie [name] MRN: [id number] DOB: [birth year] Today's Date: 12/04/2022  History of Present Illness  Debbie [name] is a 82 y.o. female with medical history significant of hyperlipidemia, GERD, CAD s/p CABG, hypertension, hypothyroidism who presents to the emergency department due to shortness of breath that has been ongoing for about a year but which worsened within the last 2 to 3 weeks.  She went to her cardiologist yesterday for shortness of breath evaluation and complaint of weakness in the morning that has been consistent with low BP in the mornings.  She complained of bilateral leg swelling despite taking Lasix 80 mg twice daily at home (she was prescribed 120 mg Lasix daily).  Yesterday, she states that she was unable to get out of bed due to weakness and also complained of tightness around her chest with symptoms similar to prior MI.   Clinical Impression  Pt tolerated treatment well and was able to transfer and ambulate using a quad cane with no loss of balance. Pt tolerated sitting up in chair at end of therapy. Patient will benefit from continued skilled physical therapy in hospital and recommended venue below to increase strength, balance, endurance for safe ADLs and gait.         Assistance Recommended at Discharge Set up Supervision/Assistance  If plan is discharge home, recommend the following:  Can travel by private vehicle  A little help with walking and/or transfers;A little help with bathing/dressing/bathroom;Assistance with cooking/housework;Assist for transportation        Equipment Recommendations Rolling walker (2 wheels)  Recommendations for Other Services       Functional Status Assessment Patient has had a recent decline in their functional status and demonstrates the ability to make significant improvements in function in a reasonable and predictable amount of time.     Precautions / Restrictions  Precautions Precautions: Fall Restrictions Weight Bearing Restrictions: No      Mobility  Bed Mobility Overal bed mobility: Independent                  Transfers Overall transfer level: Modified independent Equipment used: Quad cane                    Ambulation/Gait Ambulation/Gait assistance: Modified independent (Device/Increase time) Gait Distance (Feet): 75 Feet Assistive device: Quad cane Gait Pattern/deviations: Decreased step length - right, Decreased step length - left Gait velocity: decreased     General Gait Details: Ambulates with use of quad cane and occasionally stabilizing herself when first standing up  Stairs            Wheelchair Mobility     Tilt Bed    Modified Rankin (Stroke Patients Only)       Balance Overall balance assessment: Independent, Modified Independent Sitting-balance support: Feet supported, No upper extremity supported Sitting balance-Leahy Scale: Good Sitting balance - Comments: good sitting balance at EOB   Standing balance support: Single extremity supported Standing balance-Leahy Scale: Fair Standing balance comment: fair/good standing balance with occasional use of RW or stable object                             Pertinent Vitals/Pain Pain Assessment Pain Assessment: No/denies pain    Home Living Family/patient expects to be discharged to:: Private residence Living Arrangements: Alone Available Help at Discharge: Family;Friend(s);Available PRN/intermittently Type of Home: House Home Access: Stairs to enter Entrance Stairs-Rails: Right Entrance  Stairs-Number of Steps: 2   Home Layout: One level Home Equipment: Rollator (4 wheels);Cane - quad;Cane - single point;Grab bars - toilet;Shower seat Additional Comments: Pt reported no change in history since previous admission    Prior Function Prior Level of Function : Independent/Modified Independent;Driving             Mobility  Comments: Independent community ambulator with cane primarily being used outside the home to ambulation. ADLs Comments: Independent ADL and IADL     Hand Dominance   Dominant Hand: Right    Extremity/Trunk Assessment                Communication   Communication: No difficulties  Cognition Arousal/Alertness: Awake/alert Behavior During Therapy: WFL for tasks assessed/performed Overall Cognitive Status: Within Functional Limits for tasks assessed                                          General Comments      Exercises     Assessment/Plan    PT Assessment Patient needs continued PT services  PT Problem List Decreased strength;Decreased activity tolerance;Decreased balance       PT Treatment Interventions DME instruction;Gait training;Therapeutic activities;Therapeutic exercise;Balance training    PT Goals (Current goals can be found in the Care Plan section)  Acute Rehab PT Goals Patient Stated Goal: Return home with family assist PT Goal Formulation: With patient Time For Goal Achievement: 12/18/22 Potential to Achieve Goals: Good    Frequency Min 3X/week     Co-evaluation               AM-PAC PT "6 Clicks" Mobility  Outcome Measure Help needed turning from your back to your side while in a flat bed without using bedrails?: None Help needed moving from lying on your back to sitting on the side of a flat bed without using bedrails?: None Help needed moving to and from a bed to a chair (including a wheelchair)?: A Little Help needed standing up from a chair using your arms (e.g., wheelchair or bedside chair)?: A Little Help needed to walk in hospital room?: A Little Help needed climbing 3-5 steps with a railing? : A Little 6 Click Score: 20    End of Session   Activity Tolerance: Patient tolerated treatment well Patient left: in chair;with call bell/phone within reach Nurse Communication: Mobility status PT Visit Diagnosis:  Unsteadiness on feet (R26.81);Other abnormalities of gait and mobility (R26.89);Muscle weakness (generalized) (M62.81)    Time: 1610-9604 PT Time Calculation (min) (ACUTE ONLY): 21 min   Charges:   PT Evaluation $PT Eval Moderate Complexity: 1 Mod PT Treatments $Therapeutic Activity: 8-22 mins PT General Charges $$ ACUTE PT VISIT: 1 Visit       Thana Ramp SPT [place], DPT Program

## 2022-12-04 NOTE — H&P (Signed)
History and Physical    Patient: Debbie [name] NWG:[id number] DOB: 1940/12/12 DOA: 12/03/2022 DOS: the patient was seen and examined on 12/04/2022 PCP: Richardean Chimera, MD  Patient coming from: Home  Chief Complaint:  Chief Complaint  Patient presents with   Leg Swelling   HPI: Debbie [name] is a 82 y.o. female with medical history significant of hyperlipidemia, GERD, CAD s/p CABG, hypertension, hypothyroidism who presents to the emergency department due to shortness of breath that has been ongoing for about a year but which worsened within the last 2 to 3 weeks.  She went to her cardiologist yesterday for shortness of breath evaluation and complaint of weakness in the morning that has been consistent with low BP in the mornings.  She complained of bilateral leg swelling despite taking Lasix 80 mg twice daily at home (she was prescribed 120 mg Lasix daily).  Yesterday, she states that she was unable to get out of bed due to weakness and also complained of tightness around her chest with symptoms similar to prior MI.  ED Course:  In the emergency department, she was hemodynamically stable.  Workup in the ED showed normal CBC and BMP except for blood glucose of 112, troponin x 2 was flat at 29, D-dimer 0.70, BNP 710, urinalysis was unimpressive for UTI.  SARS coronavirus 2 was negative. Chest x-ray shows small left pleural effusion CT chest without contrast showed no evidence of active pulmonary disease She was treated with Tylenol, Lasix and Robaxin. Hospitalist was asked to admit patient for further evaluation and management.  Review of Systems: Review of systems as noted in the HPI. All other systems reviewed and are negative.   Past Medical History:  Diagnosis Date   Allergic rhinitis    Anxiety    Arthritis    Asthma    Bladder spasms    Carotid artery disease (HCC)    Collagen vascular disease (HCC)    COPD (chronic obstructive pulmonary disease) (HCC)    Cystocele     Essential hypertension    GERD (gastroesophageal reflux disease)    Hyperlipidemia    Hypothyroidism    Ischemic cardiomyopathy 12/02/2021   Echo 11/27/2021:     Multiple vessel coronary artery disease => CABG (LIMA-dLAD, SVG-Diag, Seq SVG-OM-OM-PDA)    a. s/p CABG in 2006 b. NSTEMI in 11/2021 with DES to LM and LCx and repeat cath later in admission with DES to SVG-D1   NSTEMI (non-ST elevated myocardial infarction) (HCC) 11/22/2021   Osteoporosis    Renal insufficiency    Vaginal vault prolapse    Past Surgical History:  Procedure Laterality Date   ANTERIOR AND POSTERIOR REPAIR N/A 04/06/2013   Procedure: CYSTOCELE REPAIR ;  Surgeon: Martina Sinner, MD;  Location: [facility] ORS;  Service: Urology;  Laterality: N/A;   CATARACTS     REMOVED   COLONOSCOPY     CORNEA LACERATION REPAIR     CORONARY ARTERY BYPASS GRAFT     X 5 VESSELS   CORONARY STENT INTERVENTION N/A 11/23/2021   Procedure: CORONARY STENT INTERVENTION;  Surgeon: Tonny Bollman, MD;  Location: [facility] INVASIVE CV LAB;  Service: Cardiovascular;  Laterality: N/A;   CORONARY/GRAFT ACUTE MI REVASCULARIZATION N/A 11/24/2021   Procedure: Coronary/Graft Acute MI Revascularization;  Surgeon: Runell Gess, MD;  Location: [facility] INVASIVE CV LAB;  Service: Cardiovascular;  Laterality: N/A;   CYSTOSCOPY N/A 04/06/2013   Procedure: CYSTOSCOPY;  Surgeon: Martina Sinner, MD;  Location: [facility] ORS;  Service: Urology;  Laterality: N/A;   HERNIA REPAIR     LEFT HEART CATH AND CORONARY ANGIOGRAPHY N/A 11/24/2021   Procedure: LEFT HEART CATH AND CORONARY ANGIOGRAPHY;  Surgeon: Runell Gess, MD;  Location: [facility] INVASIVE CV LAB;  Service: Cardiovascular;  Laterality: N/A;   LEFT HEART CATH AND CORS/GRAFTS ANGIOGRAPHY N/A 11/23/2021   Procedure: LEFT HEART CATH AND CORS/GRAFTS ANGIOGRAPHY;  Surgeon: Tonny Bollman, MD;  Location: [facility] INVASIVE CV LAB;  Service: Cardiovascular;  Laterality: N/A;   SEPTOPLASTY N/A 04/28/2015   Procedure: SEPTOPLASTY;   Surgeon: Melvenia Beam, MD;  Location: [facility];  Service: ENT;  Laterality: N/A;   SINUS ENDO W/FUSION Bilateral 04/28/2015   Procedure: ENDOSCOPIC SINUS SURGERY WITH NAVIGATION;  Surgeon: Melvenia Beam, MD;  Location: [facility] OR;  Service: ENT;  Laterality: Bilateral;   TUBAL LIGATION      Social History:  reports that she has never smoked. She has never used smokeless tobacco. She reports current alcohol use. She reports that she does not use drugs.   Allergies  Allergen Reactions   Ivp Dye [Iodinated Contrast Media] Swelling    Kidney Dye   Betadine [Povidone Iodine] Rash   Codeine Nausea And Vomiting   Other Nausea And Vomiting    Narcotics - nausea,vomiting   Penicillins Rash    Did it involve swelling of the face/tongue/throat, SOB, or low BP? No Did it involve sudden or severe rash/hives, skin peeling, or any reaction on the inside of your mouth or nose? No Did you need to seek medical attention at a hospital or doctor's office? No When did it last happen?       If all above answers are "NO", may proceed with cephalosporin use.    Sulfa Antibiotics Rash    Family History  Problem Relation Age of Onset   Cirrhosis Father    Lung disease Father    Diabetes Mellitus II Mother    Heart Problems Mother      Prior to Admission medications   Medication Sig Start Date End Date Taking? Authorizing Provider  acetaminophen (TYLENOL) 650 MG CR tablet Take 650 mg by mouth in the morning.    [provider]  atorvastatin (LIPITOR) 80 MG tablet Take 1 tablet (80 mg total) by mouth at bedtime. 01/03/19   Shon Hale, MD  bisoprolol (ZEBETA) 5 MG tablet Take 0.5 tablets (2.5 mg total) by mouth daily. 12/08/21   Johnson, Clanford L, MD  cephALEXin (KEFLEX) 500 MG capsule Take 500 mg by mouth 3 (three) times daily. 04/19/22   [provider]  clopidogrel (PLAVIX) 75 MG tablet Take 1 tablet (75 mg total) by mouth daily. 03/26/22   Jonelle Sidle, MD  ezetimibe (ZETIA) 10  MG tablet Take 1 tablet (10 mg total) by mouth daily. 11/29/21   Duke, Roe Rutherford, PA  famotidine (PEPCID) 20 MG tablet Take 40 mg by mouth at bedtime. 08/18/18   [provider]  furosemide (LASIX) 80 MG tablet Take 1 tablet (80 mg total) by mouth daily. Patient taking differently: Take 120 mg by mouth daily. 04/17/22 04/12/23  Strader, Lennart Pall, PA-C  isosorbide mononitrate (IMDUR) 30 MG 24 hr tablet Take 1 tablet (30 mg total) by mouth daily. 12/08/21   Johnson, Clanford L, MD  levocetirizine (XYZAL) 5 MG tablet Take 5 mg by mouth every evening. Equate levocetirizine.    [provider]  levothyroxine (SYNTHROID) 88 MCG tablet Take 88 mcg by mouth daily. 09/04/18   [provider]  nitroGLYCERIN (NITROSTAT)  0.4 MG SL tablet Place 1 tablet (0.4 mg total) under the tongue every 5 (five) minutes x 3 doses as needed for chest pain (if no relief after 3rd dose, call 911 or proceed to the ED for an evaluation). 08/06/19   Netta Neat., NP  ondansetron (ZOFRAN) 4 MG tablet Take 1 tablet (4 mg total) by mouth every 6 (six) hours as needed for nausea. 10/20/21   [name], Pratik D, DO  pantoprazole (PROTONIX) 40 MG tablet Take 40 mg by mouth daily. 10/21/21   [provider]  potassium chloride (KLOR-CON M) 10 MEQ tablet Take 1 tablet (10 mEq total) by mouth daily. Patient taking differently: Take 20 mEq by mouth daily. 12/07/21   Johnson, Clanford L, MD  potassium chloride (KLOR-CON) 10 MEQ tablet Take 10 mEq by mouth 2 (two) times daily. 04/24/22   [provider]  tamsulosin (FLOMAX) 0.4 MG CAPS capsule Take 0.4 mg by mouth.    [provider]    Physical Exam: BP 116/68   Pulse 60   Temp 98.2 F (36.8 C) (Oral)   Resp 17   Ht 5\' 3"  (1.6 m)   Wt 56.5 kg   SpO2 97%   BMI 22.06 kg/m   General: 82 y.o. year-old female well developed well nourished in no acute distress.  Alert and oriented x3. HEENT: NCAT, EOMI Neck: Supple, trachea  medial Cardiovascular: Regular rate and rhythm with no rubs or gallops.  No thyromegaly or JVD noted.  +2 bilateral lower extremity edema. 2/4 pulses in all 4 extremities. Respiratory: Clear to auscultation with no wheezes or rales. Good inspiratory effort. Abdomen: Soft, nontender nondistended with normal bowel sounds x4 quadrants. Muskuloskeletal: No cyanosis, clubbing noted bilaterally Neuro: CN II-XII intact, strength 5/5 x 4, sensation, reflexes intact Skin: No ulcerative lesions noted or rashes Psychiatry: Judgement and insight appear normal. Mood is appropriate for condition and setting          Labs on Admission:  Basic Metabolic Panel: Recent Labs  Lab 12/03/22 1925  NA 137  K 3.5  CL 103  CO2 25  GLUCOSE 112*  BUN 11  CREATININE 1.00  CALCIUM 9.2   Liver Function Tests: No results for input(s): "AST", "ALT", "ALKPHOS", "BILITOT", "PROT", "ALBUMIN" in the last 168 hours. No results for input(s): "LIPASE", "AMYLASE" in the last 168 hours. No results for input(s): "AMMONIA" in the last 168 hours. CBC: Recent Labs  Lab 12/03/22 1925  WBC 7.6  HGB 12.3  HCT 39.4  MCV 82.4  PLT 366   Cardiac Enzymes: No results for input(s): "CKTOTAL", "CKMB", "CKMBINDEX", "TROPONINI" in the last 168 hours.  BNP (last 3 results) Recent Labs    12/06/21 0527 12/07/21 0521 12/03/22 1835  BNP 415.0* 212.0* 710.0*    ProBNP (last 3 results) No results for input(s): "PROBNP" in the last 8760 hours.  CBG: No results for input(s): "GLUCAP" in the last 168 hours.  Radiological Exams on Admission: CT Chest Wo Contrast  Result Date: 12/03/2022 CLINICAL DATA:  Chronic dyspnea of unclear etiology. Decreased energy over the past year. Swelling in the feet. Pleural effusion on x-ray. IV contrast allergy. EXAM: CT CHEST WITHOUT CONTRAST TECHNIQUE: Multidetector CT imaging of the chest was performed following the standard protocol without IV contrast. RADIATION DOSE REDUCTION: This  exam was performed according to the departmental dose-optimization program which includes automated exposure control, adjustment of the mA and/or kV according to patient size and/or use of iterative reconstruction technique. COMPARISON:  Chest radiograph 12/03/2022 FINDINGS: Cardiovascular: Normal heart size. No pericardial effusion. Normal caliber thoracic aorta. Calcification of the aorta and coronary arteries. Postoperative changes in the mediastinum consistent with coronary bypass. Mediastinum/Nodes: Thyroid gland is unremarkable. Esophagus is decompressed. Moderate-sized esophageal hiatal hernia. No significant lymphadenopathy. Lungs/Pleura: Mild dependent atelectasis in the lung bases. No airspace disease or consolidation. No interstitial infiltration. Bronchial wall thickening suggesting chronic bronchitis. No pleural effusions. No pneumothorax. Upper Abdomen: No acute abnormality. Musculoskeletal: Degenerative changes in the spine. Sternotomy wires. IMPRESSION: 1. No evidence of active pulmonary disease. 2. Coronary artery and aortic calcification post coronary bypass. 3. Moderate-sized esophageal hiatal hernia. Electronically Signed   By: Burman Nieves M.D.   On: 12/03/2022 23:37   DG Chest 2 View  Result Date: 12/03/2022 CLINICAL DATA:  Pedal edema EXAM: CHEST - 2 VIEW COMPARISON:  Radiograph 12/05/2021 FINDINGS: Stable enlargement of the cardiomediastinal silhouette. Sternotomy and CABG. Aortic atherosclerotic calcification. Hyperinflation and chronic bronchitic changes. Small left pleural effusion. No focal consolidation pneumothorax. IMPRESSION: Small left pleural effusion. Emphysema. Cardiomegaly. Electronically Signed   By: Minerva Fester M.D.   On: 12/03/2022 19:54    EKG: I independently viewed the EKG done and my findings are as followed: Sinus bradycardia at a rate of 59 bpm with nonspecific ST and T wave abnormality  Assessment/Plan Present on Admission:  Essential hypertension   Mixed hyperlipidemia  Acquired hypothyroidism  GERD without esophagitis  Multiple vessel coronary artery disease => CABG (LIMA-dLAD, SVG-Diag, Seq SVG-OM-OM-PDA)  Principal Problem:   CHF, acute on chronic (HCC) Active Problems:   Acquired hypothyroidism   Multiple vessel coronary artery disease => CABG (LIMA-dLAD, SVG-Diag, Seq SVG-OM-OM-PDA)   Essential hypertension   GERD without esophagitis   Mixed hyperlipidemia  Acute on chronic systolic CHF Continue total input/output, daily weights and fluid restriction IV Lasix 80 mg x 1 was given in the ED with an output of 600 mL of urine Continue IV Lasix 60 mg twice daily with holding parameters Zebeta held at this time due to bradycardia Continue Cardiac diet  Echocardiogram in the morning   Essential hypertension Continue IV Lasix as ordered above with holding parameters  Mixed hyperlipidemia Continue Lipitor, Zetia  Acquired hypothyroidism Continue Synthroid  GERD Continue Protonix  CAD s/p CABG Continue Plavix, Lipitor Imdur temporarily held at this time due to low BP   DVT prophylaxis: Lovenox  Advance Care Planning: Full code  Consults: None  Family Communication: None at bedside  Severity of Illness: The appropriate patient status for this patient is INPATIENT. Inpatient status is judged to be reasonable and necessary in order to provide the required intensity of service to ensure the patient's safety. The patient's presenting symptoms, physical exam findings, and initial radiographic and laboratory data in the context of their chronic comorbidities is felt to place them at high risk for further clinical deterioration. Furthermore, it is not anticipated that the patient will be medically stable for discharge from the hospital within 2 midnights of admission.   * I certify that at the point of admission it is my clinical judgment that the patient will require inpatient hospital care spanning beyond 2 midnights from  the point of admission due to high intensity of service, high risk for further deterioration and high frequency of surveillance required.*  Author: Frankey Shown, DO 12/04/2022 5:56 AM  For on call review [email or web address].

## 2022-12-05 DIAGNOSIS — Z7902 Long term (current) use of antithrombotics/antiplatelets: Secondary | ICD-10-CM

## 2022-12-05 DIAGNOSIS — I5043 Acute on chronic combined systolic (congestive) and diastolic (congestive) heart failure: Secondary | ICD-10-CM | POA: Diagnosis not present

## 2022-12-05 DIAGNOSIS — I25810 Atherosclerosis of coronary artery bypass graft(s) without angina pectoris: Secondary | ICD-10-CM

## 2022-12-05 LAB — CBC
HCT: 37.5 % (ref 36.0–46.0)
Hemoglobin: 11.5 g/dL — ABNORMAL LOW (ref 12.0–15.0)
MCH: 25.4 pg — ABNORMAL LOW (ref 26.0–34.0)
MCHC: 30.7 g/dL (ref 30.0–36.0)
MCV: 83 fL (ref 80.0–100.0)
Platelets: 354 10*3/uL (ref 150–400)
RBC: 4.52 MIL/uL (ref 3.87–5.11)
RDW: 15 % (ref 11.5–15.5)
WBC: 6.5 10*3/uL (ref 4.0–10.5)
nRBC: 0 % (ref 0.0–0.2)

## 2022-12-05 LAB — BRAIN NATRIURETIC PEPTIDE: B Natriuretic Peptide: 390 pg/mL — ABNORMAL HIGH (ref 0.0–100.0)

## 2022-12-05 LAB — T4, FREE: Free T4: 1.23 ng/dL — ABNORMAL HIGH (ref 0.61–1.12)

## 2022-12-05 LAB — BASIC METABOLIC PANEL
Anion gap: 8 (ref 5–15)
BUN: 12 mg/dL (ref 8–23)
CO2: 27 mmol/L (ref 22–32)
Calcium: 8.9 mg/dL (ref 8.9–10.3)
Chloride: 103 mmol/L (ref 98–111)
Creatinine, Ser: 0.96 mg/dL (ref 0.44–1.00)
GFR, Estimated: 59 mL/min — ABNORMAL LOW (ref >60)
Glucose, Bld: 103 mg/dL — ABNORMAL HIGH (ref 70–99)
Potassium: 3.3 mmol/L — ABNORMAL LOW (ref 3.5–5.1)
Sodium: 138 mmol/L (ref 135–145)

## 2022-12-05 MED ORDER — ISOSORBIDE MONONITRATE ER 30 MG PO TB24: 30.0000 mg | ORAL_TABLET | Freq: Every day | ORAL | 1 refills | Status: AC

## 2022-12-05 MED ORDER — POTASSIUM CHLORIDE CRYS ER 20 MEQ PO TBCR
40.0000 meq | EXTENDED_RELEASE_TABLET | Freq: Once | ORAL | Status: AC
  Administered 2022-12-05: 40 meq via ORAL
  Filled 2022-12-05: qty 2

## 2022-12-05 MED ORDER — TORSEMIDE 20 MG PO TABS: 20.0000 mg | ORAL_TABLET | Freq: Two times a day (BID) | ORAL | 1 refills | Status: DC

## 2022-12-05 MED ORDER — SACUBITRIL-VALSARTAN 24-26 MG PO TABS: 1.0000 | ORAL_TABLET | Freq: Two times a day (BID) | ORAL | 2 refills | Status: DC

## 2022-12-05 MED ORDER — TORSEMIDE 20 MG PO TABS: 20.0000 mg | ORAL_TABLET | Freq: Two times a day (BID) | ORAL | Status: DC

## 2022-12-05 MED ORDER — SACUBITRIL-VALSARTAN 24-26 MG PO TABS: 1.0000 | ORAL_TABLET | Freq: Two times a day (BID) | ORAL | 2 refills | Status: AC

## 2022-12-05 MED ORDER — TORSEMIDE 20 MG PO TABS: 20.0000 mg | ORAL_TABLET | Freq: Two times a day (BID) | ORAL | 2 refills | Status: AC

## 2022-12-05 MED ORDER — SACUBITRIL-VALSARTAN 24-26 MG PO TABS
1.0000 | ORAL_TABLET | Freq: Two times a day (BID) | ORAL | Status: DC
  Administered 2022-12-05: 1 via ORAL
  Filled 2022-12-05: qty 1

## 2022-12-05 NOTE — TOC Transition Note (Signed)
Transition of Care [facility]) - CM/SW Discharge Note   Patient Details  Name: Debbie [name] MRN: [id number] Date of Birth: March 26, 1941  Transition of Care (TOC) CM/SW Contact:  Catalina Gravel, LCSW Phone Number: 12/05/2022, 12:25 PM   Clinical Narrative:    Pt DC ready, CSW contacted [facility] advising that the pt will DC today.  No further TOC needs.    Final next level of care: Home w Home Health Services Barriers to Discharge: No Barriers Identified   Patient Goals and CMS Choice CMS Medicare.gov Compare Post Acute Care list provided to:: Patient    Discharge Placement                         Discharge Plan and Services Additional resources added to the After Visit Summary for                              [facility] Agency: [facility] Date [facility] Agency Contacted: 12/04/22 Time HH Agency Contacted: [phone] Representative spoke with at [facility] Agency: Clifton Custard  Social Determinants of Health (SDOH) Interventions SDOH Screenings   Food Insecurity: No Food Insecurity (12/04/2022)  Housing: Low Risk  (12/04/2022)  Transportation Needs: No Transportation Needs (12/04/2022)  Utilities: Not At Risk (12/04/2022)  Financial Resource Strain: Low Risk  (11/28/2022)  Physical Activity: Inactive (09/17/2022)  Social Connections: Unknown (12/31/2018)  Stress: Unknown (12/31/2018)  Tobacco Use: Low Risk  (11/28/2022)     Readmission Risk Interventions    12/04/2022    1:41 PM 11/29/2021    3:17 PM  Readmission Risk Prevention Plan  Post Dischage Appt Not Complete   Medication Screening Complete   Transportation Screening Complete Complete  PCP or Specialist Appt within 5-7 Days  Complete  Home Care Screening  Complete  Medication Review (RN CM)  Complete

## 2022-12-05 NOTE — Discharge Summary (Addendum)
Physician Discharge Summary   Patient: Debbie [name] MRN: [id number] DOB: 1940/08/27  Admit date:     12/03/2022  Discharge date: 12/05/22  Discharge Physician: Kendell Bane   PCP: Richardean Chimera, MD   Recommendations at discharge:  Follow-up with a cardiologist in 2-4 weeks, current medication changes or torsemide 20 mg twice a day, Entresto 24-26 mg twice a day, spironolactone can be started as an outpatient with cardiologist  -Daily weight recommended, any increase in weight 3-5 pounds (or symptoms such as lower extremity swelling, shortness of breath ) extra dose of torsemide 20 mg may be given Discharge Diagnoses: Principal Problem:   CHF, acute on chronic (HCC) Active Problems:   Acquired hypothyroidism   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 *  Hospital Course: Debbie [name] is a 82 y.o. female with medical history significant of hyperlipidemia, GERD, CAD s/p CABG, hypertension, hypothyroidism who presents to the emergency department due to shortness of breath that has been ongoing for about a year but which worsened within the last 2 to 3 weeks.  She went to her cardiologist yesterday for shortness of breath evaluation and complaint of weakness in the morning that has been consistent with low BP in the mornings.  She complained of bilateral leg swelling despite taking Lasix 80 mg twice daily at home (she was prescribed 120 mg Lasix daily).  Yesterday, she states that she was unable to get out of bed due to weakness and also complained of tightness around her chest with symptoms similar to prior MI.   ED Course:  In the emergency department, she was hemodynamically stable.  Workup in the ED showed normal CBC and BMP except for blood glucose of 112, troponin x 2 was flat at 29, D-dimer  0.70, BNP 710, urinalysis was unimpressive for UTI.  SARS coronavirus 2 was negative. Chest x-ray shows small left pleural effusion CT chest without contrast showed no evidence of active pulmonary disease She was treated with Tylenol, Lasix and Robaxin. Hospitalist was asked to admit patient for further evaluation and management    Acute on chronic systolic CHF - EF was previously at 25-30% by echo in 11/2021, normalized to 55-60% by echo in 02/2022. - BNP elevated at 710 on admission  -  CXR showed a small left pleural effusion.  - Continue total input/output, daily weights and fluid restriction IV Lasix 80 mg x 1 was given in the ED, then IV Lasix 60 mg twice daily Zebeta held at this time due to bradycardia  Echocardiogram:  -Cardiology consulted, appreciate further evaluation recommendations   ReDs 20 this a.m.  Patient made 2.6L urine output in the last 24 hours with net -2.1 L  Intake/Output Summary (Last 24 hours) at 12/05/2022 1208 Last data filed at 12/05/2022 0900 Gross per 24 hour  Intake 540 ml  Output 2825 ml  Net -2285 ml    - was on IV Lasix 60 mg twice daily. Stop IV Lasix and start  - p.o. torsemide 20 mg twice daily. -Home bisoprolol held due to HR 40 to 50s -Start Entresto 24-26 mg twice daily -Spironolactone can be initiated outpatient -Patient had cystitis when she was initiated on SGLT2 inhibitors in the prior admission. Not a candidate for SGLT2 inhibitors,    Essential hypertension Resume medication as above   Mixed hyperlipidemia  Continue Lipitor, Zetia   Acquired hypothyroidism Continue Synthroid   GERD Continue Protonix   CAD s/p CABG 2006-non-STEMI 11/2021 11/2021 with cath showing patent LIMA-LAD and occlusion of all vein grafts --> underwent DES to LM and LCx and staged DES to SVG-D1.  Continue Plavix, atorvastatin, Zetia, Imdur, not on beta-blocker due to bradycardia     Consultants: Cardiologist Procedures performed: 2D  echocardiogram Disposition: Home Diet recommendation:  Discharge Diet Orders (From admission, onward)     Start     Ordered   12/05/22 0000  Diet - low sodium heart healthy        12/05/22 1203           Cardiac diet DISCHARGE MEDICATION: Allergies as of 12/05/2022       Reactions   Ivp Dye [iodinated Contrast Media] Swelling   Kidney Dye   Betadine [povidone Iodine] Rash   Codeine Nausea And Vomiting   Other Nausea And Vomiting   Narcotics - nausea,vomiting   Penicillins Rash   Did it involve swelling of the face/tongue/throat, SOB, or low BP? No Did it involve sudden or severe rash/hives, skin peeling, or any reaction on the inside of your mouth or nose? No Did you need to seek medical attention at a hospital or doctor's office? No When did it last happen?       If all above answers are "NO", may proceed with cephalosporin use.   Sulfa Antibiotics Rash        Medication List     STOP taking these medications    bisoprolol 5 MG tablet Commonly known as: ZEBETA   cephALEXin 500 MG capsule Commonly known as: KEFLEX   ezetimibe 10 MG tablet Commonly known as: ZETIA   famotidine 20 MG tablet Commonly known as: PEPCID   furosemide 80 MG tablet Commonly known as: LASIX   ondansetron 4 MG tablet Commonly known as: ZOFRAN       TAKE these medications    acetaminophen 650 MG CR tablet Commonly known as: TYLENOL Take 650 mg by mouth in the morning.   atorvastatin 80 MG tablet Commonly known as: LIPITOR Take 1 tablet (80 mg total) by mouth at bedtime.   clopidogrel 75 MG tablet Commonly known as: PLAVIX Take 1 tablet (75 mg total) by mouth daily.   isosorbide mononitrate 30 MG 24 hr tablet Commonly known as: IMDUR Take 1 tablet (30 mg total) by mouth daily.   levocetirizine 5 MG tablet Commonly known as: XYZAL Take 5 mg by mouth every evening. Equate levocetirizine.   levothyroxine 88 MCG tablet Commonly known as: SYNTHROID Take 88 mcg by  mouth daily.   nitroGLYCERIN 0.4 MG SL tablet Commonly known as: NITROSTAT Place 1 tablet (0.4 mg total) under the tongue every 5 (five) minutes x 3 doses as needed for chest pain (if no relief after 3rd dose, call 911 or proceed to the ED for an evaluation).   pantoprazole 40 MG tablet Commonly known as: PROTONIX Take 40 mg by mouth daily.   potassium chloride 10 MEQ tablet Commonly known as: KLOR-CON M Take 1 tablet (10 mEq total) by mouth daily. What changed: how much to take   sacubitril-valsartan 24-26 MG Commonly known as: ENTRESTO Take 1 tablet by mouth 2 (two) times daily.   tamsulosin 0.4 MG Caps capsule Commonly known as: FLOMAX Take 0.4 mg by mouth.   torsemide 20 MG tablet Commonly known as: DEMADEX Take 1 tablet (20 mg total) by mouth 2 (two) times daily.  Follow-up Information     [facility] Follow up.   Specialty: [facility] Contact information: [address] [place] [address] [phone]         Sharlene Dory, NP Follow up on 12/24/2022.   Specialty: Cardiology Why: Cardiology Follow-up on 12/24/2022 at 2:30 PM. Contact information: [address] [place] [address] [phone]                Discharge Exam: Ceasar Mons Weights   12/04/22 0101 12/04/22 0500 12/05/22 0328  Weight: 56.5 kg 56.5 kg 59.3 kg        General:  AAO x 3,  cooperative, no distress;   HEENT:  Normocephalic, PERRL, otherwise with in Normal limits   Neuro:  CNII-XII intact. , normal motor and sensation, reflexes intact   Lungs:   Clear to auscultation BL, Respirations unlabored,  No wheezes / crackles  Cardio:    S1/S2, RRR, No murmure, No Rubs or Gallops   Abdomen:  Soft, non-tender, bowel sounds active all four quadrants, no guarding or peritoneal signs.  Muscular  skeletal:  Limited exam -global generalized weaknesses - in bed, able to move all 4 extremities,   2+ pulses,  symmetric, No pitting edema  Skin:   Dry, warm to touch, negative for any Rashes,  Wounds: Please see nursing documentation          Condition at discharge: good  The results of significant diagnostics from this hospitalization (including imaging, microbiology, ancillary and laboratory) are listed below for reference.   Imaging Studies: ECHOCARDIOGRAM COMPLETE  Result Date: 12/04/2022    ECHOCARDIOGRAM REPORT   Patient Name:   Debbie [name] Date of Exam: 12/04/2022 Medical Rec #:  [id number]           Height:       63.0 in Accession #:    [id number]          Weight:       124.6 lb Date of Birth:  [birth year]           BSA:          1.581 m Patient Age:    81 years            BP:           116/68 mmHg Patient Gender: F                   HR:           60 bpm. [facility] Procedure: 2D Echo, Cardiac Doppler and Color Doppler Indications:    CHF-Acute Systolic I50.21  History:        Patient has prior history of Echocardiogram examinations, most                 recent 03/14/2022. CHF and Cardiomyopathy, CAD and Previous                 Myocardial Infarction, Prior CABG, COPD; Risk                 Factors:Hypertension and Dyslipidemia.  Sonographer:    Celesta Gentile RCS Referring Phys: [id number] OLADAPO ADEFESO IMPRESSIONS  1. Left ventricular ejection fraction, by estimation, is 40 to 45%. The left ventricle has mildly decreased function. The left ventricle demonstrates regional wall motion abnormalities (see scoring diagram/findings for description). Left ventricular diastolic parameters are consistent with Grade III diastolic dysfunction (restrictive).  2. Right ventricular  systolic function is normal. The right ventricular size is normal. There is moderately elevated pulmonary artery systolic pressure.  3. Left atrial size was severely dilated.  4. Right atrial size was mildly dilated.  5. The mitral valve is normal in structure. Mild mitral valve regurgitation. No evidence of mitral stenosis.  6. The aortic valve is  tricuspid. Aortic valve regurgitation is not visualized. No aortic stenosis is present.  7. The inferior vena cava is normal in size with greater than 50% respiratory variability, suggesting right atrial pressure of 3 mmHg. Comparison(s): Changes from prior study are noted. LVEF worsened from 55% in 10/23 to 40-45% now. FINDINGS  Left Ventricle: Left ventricular ejection fraction, by estimation, is 40 to 45%. The left ventricle has mildly decreased function. The left ventricle demonstrates regional wall motion abnormalities. The left ventricular internal cavity size was normal in size. There is no left ventricular hypertrophy. Left ventricular diastolic parameters are consistent with Grade III diastolic dysfunction (restrictive).  LV Wall Scoring: The apex is akinetic. The entire anterior wall, antero-lateral wall, anterior septum, and apical lateral segment are hypokinetic. The inferior septum, entire inferior wall, and posterior wall are normal. Right Ventricle: The right ventricular size is normal. No increase in right ventricular wall thickness. Right ventricular systolic function is normal. There is moderately elevated pulmonary artery systolic pressure. The tricuspid regurgitant velocity is 3.32 m/s, and with an assumed right atrial pressure of 3 mmHg, the estimated right ventricular systolic pressure is 47.1 mmHg. Left Atrium: Left atrial size was severely dilated. Right Atrium: Right atrial size was mildly dilated. Pericardium: There is no evidence of pericardial effusion. Mitral Valve: The mitral valve is normal in structure. Mild mitral valve regurgitation. No evidence of mitral valve stenosis. Tricuspid Valve: The tricuspid valve is normal in structure. Tricuspid valve regurgitation is mild . No evidence of tricuspid stenosis. Aortic Valve: The aortic valve is tricuspid. Aortic valve regurgitation is not visualized. No aortic stenosis is present. Pulmonic Valve: The pulmonic valve was normal in structure.  Pulmonic valve regurgitation is trivial. No evidence of pulmonic stenosis. Aorta: The aortic root is normal in size and structure. Venous: The inferior vena cava is normal in size with greater than 50% respiratory variability, suggesting right atrial pressure of 3 mmHg. IAS/Shunts: No atrial level shunt detected by color flow Doppler.  LEFT VENTRICLE PLAX 2D LVIDd:         4.90 cm   Diastology LVIDs:         3.50 cm   LV e' medial:    4.46 cm/s LV PW:         1.00 cm   LV E/e' medial:  20.3 LV IVS:        0.90 cm   LV e' lateral:   8.38 cm/s LVOT diam:     1.90 cm   LV E/e' lateral: 10.8 LV SV:         54 LV SV Index:   34 LVOT Area:     2.84 cm  RIGHT VENTRICLE RV S prime:     8.38 cm/s TAPSE (M-mode): 1.7 cm LEFT ATRIUM             Index        RIGHT ATRIUM           Index LA diam:        5.00 cm 3.16 cm/m   RA Area:     17.60 cm LA Vol (A2C):   73.7 ml 46.61 ml/m  RA Volume:   49.60 ml  31.37 ml/m LA Vol (A4C):   85.9 ml 54.33 ml/m LA Biplane Vol: 80.0 ml 50.60 ml/m  AORTIC VALVE LVOT Vmax:   82.50 cm/s LVOT Vmean:  55.400 cm/s LVOT VTI:    0.190 m  AORTA Ao Root diam: 3.00 cm MITRAL VALVE                  TRICUSPID VALVE MV Area (PHT): 4.15 cm       TR Peak grad:   44.1 mmHg MV Decel Time: 183 msec       TR Vmax:        332.00 cm/s MR Peak grad:    100.0 mmHg MR Mean grad:    63.0 mmHg    SHUNTS MR Vmax:         500.00 cm/s  Systemic VTI:  0.19 m MR Vmean:        368.0 cm/s   Systemic Diam: 1.90 cm MR PISA:         2.26 cm MR PISA Eff ROA: 11 mm MR PISA Radius:  0.60 cm MV E velocity: 90.50 cm/s MV A velocity: 37.40 cm/s MV E/A ratio:  2.42 Vishnu Priya Mallipeddi Electronically signed by Winfield Rast Mallipeddi Signature Date/Time: 12/04/2022/1:37:32 PM    Final    CT Chest Wo Contrast  Result Date: 12/03/2022 CLINICAL DATA:  Chronic dyspnea of unclear etiology. Decreased energy over the past year. Swelling in the feet. Pleural effusion on x-ray. IV contrast allergy. EXAM: CT CHEST WITHOUT CONTRAST  TECHNIQUE: Multidetector CT imaging of the chest was performed following the standard protocol without IV contrast. RADIATION DOSE REDUCTION: This exam was performed according to the departmental dose-optimization program which includes automated exposure control, adjustment of the mA and/or kV according to patient size and/or use of iterative reconstruction technique. COMPARISON:  Chest radiograph 12/03/2022 FINDINGS: Cardiovascular: Normal heart size. No pericardial effusion. Normal caliber thoracic aorta. Calcification of the aorta and coronary arteries. Postoperative changes in the mediastinum consistent with coronary bypass. Mediastinum/Nodes: Thyroid gland is unremarkable. Esophagus is decompressed. Moderate-sized esophageal hiatal hernia. No significant lymphadenopathy. Lungs/Pleura: Mild dependent atelectasis in the lung bases. No airspace disease or consolidation. No interstitial infiltration. Bronchial wall thickening suggesting chronic bronchitis. No pleural effusions. No pneumothorax. Upper Abdomen: No acute abnormality. Musculoskeletal: Degenerative changes in the spine. Sternotomy wires. IMPRESSION: 1. No evidence of active pulmonary disease. 2. Coronary artery and aortic calcification post coronary bypass. 3. Moderate-sized esophageal hiatal hernia. Electronically Signed   By: Burman Nieves M.D.   On: 12/03/2022 23:37   DG Chest 2 View  Result Date: 12/03/2022 CLINICAL DATA:  Pedal edema EXAM: CHEST - 2 VIEW COMPARISON:  Radiograph 12/05/2021 FINDINGS: Stable enlargement of the cardiomediastinal silhouette. Sternotomy and CABG. Aortic atherosclerotic calcification. Hyperinflation and chronic bronchitic changes. Small left pleural effusion. No focal consolidation pneumothorax. IMPRESSION: Small left pleural effusion. Emphysema. Cardiomegaly. Electronically Signed   By: Minerva Fester M.D.   On: 12/03/2022 19:54    Microbiology: Results for orders placed or performed during the hospital  encounter of 12/03/22  SARS Coronavirus 2 by RT PCR (hospital order, performed in [facility] The hospital lab) *cepheid single result test* Urine, Clean Catch     Status: None   Collection Time: 12/03/22 10:57 PM   Specimen: Urine, Clean Catch; Nasal Swab  Result Value Ref Range Status   SARS Coronavirus 2 by RT PCR NEGATIVE NEGATIVE Final    Comment: (NOTE) SARS-CoV-2 target nucleic acids are NOT  DETECTED.  The SARS-CoV-2 RNA is generally detectable in upper and lower respiratory specimens during the acute phase of infection. The lowest concentration of SARS-CoV-2 viral copies this assay can detect is 250 copies / mL. A negative result does not preclude SARS-CoV-2 infection and should not be used as the sole basis for treatment or other patient management decisions.  A negative result may occur with improper specimen collection / handling, submission of specimen other than nasopharyngeal swab, presence of viral mutation(s) within the areas targeted by this assay, and inadequate number of viral copies (<250 copies / mL). A negative result must be combined with clinical observations, patient history, and epidemiological information.  Fact Sheet for Patients:   RoadLapTop.co.za  Fact Sheet for Healthcare Providers: http://kim-miller.com/  This test is not yet approved or  cleared by the [place] FDA and has been authorized for detection and/or diagnosis of SARS-CoV-2 by FDA under an Emergency Use Authorization (EUA).  This EUA will remain in effect (meaning this test can be used) for the duration of the COVID-19 declaration under Section 564(b)(1) of the Act, 21 U.S.C. section 360bbb-3(b)(1), unless the authorization is terminated or revoked sooner.  Performed at [facility], [address], [place], [place] [address]     Labs: CBC: Recent Labs  Lab 12/03/22 1925 12/04/22 0602 12/05/22 0409  WBC 7.6 7.1 6.5  HGB 12.3 11.8*  11.5*  HCT 39.4 36.8 37.5  MCV 82.4 81.1 83.0  PLT 366 343 354   Basic Metabolic Panel: Recent Labs  Lab 12/03/22 1925 12/04/22 0602 12/05/22 0409  NA 137 139 138  K 3.5 3.2* 3.3*  CL 103 104 103  CO2 25 30 27   GLUCOSE 112* 106* 103*  BUN 11 10 12   CREATININE 1.00 0.89 0.96  CALCIUM 9.2 9.1 8.9  MG  --  2.0  --   PHOS  --  4.4  --    Liver Function Tests: Recent Labs  Lab 12/04/22 0602  AST 15  ALT 14  ALKPHOS 85  BILITOT 0.8  PROT 5.8*  ALBUMIN 3.2*   CBG: No results for input(s): "GLUCAP" in the last 168 hours.  Discharge time spent: greater than 40 minutes.  Signed: Kendell Bane, MD [facility] 12/05/2022

## 2022-12-05 NOTE — Progress Notes (Signed)
   12/05/22 0800  ReDS Vest / Clip  Station Marker A  Ruler Value 34  ReDS Value Range < 36  ReDS Actual Value 20

## 2022-12-05 NOTE — Care Management Important Message (Signed)
Important Message  Patient Details  Name: Debbie [name] MRN: [id number] Date of Birth: 1940/08/25   Medicare Important Message Given:  N/A - LOS <3 / Initial given by admissions     Corey Harold 12/05/2022, 10:33 AM

## 2022-12-05 NOTE — Progress Notes (Signed)
Progress Note  Patient Name: Debbie [name] Date of Encounter: 12/05/2022  Primary Cardiologist: [name], MD  Subjective   No acute events overnight. Patient reports improvement in her symptoms of SOB. No leg swelling. Echo yesterday showed LVEF 40 to 45% with RWMA, G3 DD, normal RV function and mild MR. The LVEF worsened from normal LVEF in 10/23.  Inpatient Medications    Scheduled Meds:  atorvastatin  80 mg Oral QHS   clopidogrel  75 mg Oral Daily   ezetimibe  10 mg Oral Daily   furosemide  60 mg Intravenous Q12H   isosorbide mononitrate  30 mg Oral Daily   levothyroxine  88 mcg Oral Daily   pantoprazole  40 mg Oral Daily   potassium chloride  40 mEq Oral Once   Continuous Infusions:  PRN Meds: nitroGLYCERIN   Vital Signs    Vitals:   12/04/22 1047 12/04/22 1235 12/04/22 2005 12/05/22 0328  BP: 122/61 124/74 104/68 121/66  Pulse: (!) 47 (!) 51 (!) 56 60  Resp: 16 16 17 19   Temp: 98.3 F (36.8 C) 97.8 F (36.6 C) 98.7 F (37.1 C) 97.6 F (36.4 C)  TempSrc:  Oral Oral Oral  SpO2: 99% 97% 96% 97%  Weight:    59.3 kg  Height:        Intake/Output Summary (Last 24 hours) at 12/05/2022 0936 Last data filed at 12/05/2022 0300 Gross per 24 hour  Intake 420 ml  Output 2350 ml  Net -1930 ml   Filed Weights   12/04/22 0101 12/04/22 0500 12/05/22 0328  Weight: 56.5 kg 56.5 kg 59.3 kg    Telemetry     Personally reviewed, NSR.  ECG    Not performed today  Physical Exam   GEN: No acute distress.   Neck: No JVD. Cardiac: RRR, no murmur, rub, or gallop.  Respiratory: Nonlabored. Clear to auscultation bilaterally. GI: Soft, nontender, bowel sounds present. MS: No edema; No deformity. Neuro:  Nonfocal. Psych: Alert and oriented x 3. Normal affect.  Labs    Chemistry Recent Labs  Lab 12/03/22 1925 12/04/22 0602 12/05/22 0409  NA 137 139 138  K 3.5 3.2* 3.3*  CL 103 104 103  CO2 25 30 27   GLUCOSE 112* 106* 103*  BUN 11 10 12    CREATININE 1.00 0.89 0.96  CALCIUM 9.2 9.1 8.9  PROT  --  5.8*  --   ALBUMIN  --  3.2*  --   AST  --  15  --   ALT  --  14  --   ALKPHOS  --  85  --   BILITOT  --  0.8  --   GFRNONAA 57* >60 59*  ANIONGAP 9 5 8      Hematology Recent Labs  Lab 12/03/22 1925 12/04/22 0602 12/05/22 0409  WBC 7.6 7.1 6.5  RBC 4.78 4.54 4.52  HGB 12.3 11.8* 11.5*  HCT 39.4 36.8 37.5  MCV 82.4 81.1 83.0  MCH 25.7* 26.0 25.4*  MCHC 31.2 32.1 30.7  RDW 14.8 14.9 15.0  PLT 366 343 354    Cardiac Enzymes Recent Labs  Lab 12/03/22 1925 12/03/22 2115  TROPONINIHS 29* 29*    BNP Recent Labs  Lab 12/03/22 1835 12/05/22 0409  BNP 710.0* 390.0*     DDimer Recent Labs  Lab 12/03/22 1925  DDIMER 0.70*     Radiology    ECHOCARDIOGRAM COMPLETE  Result Date: 12/04/2022    ECHOCARDIOGRAM REPORT   Patient Name:  Debbie [name] Date of Exam: 12/04/2022 Medical Rec #:  [id number]           Height:       63.0 in Accession #:    [id number]          Weight:       124.6 lb Date of Birth:  06/18/1940           BSA:          1.581 m Patient Age:    81 years            BP:           116/68 mmHg Patient Gender: F                   HR:           60 bpm. [facility] Procedure: 2D Echo, Cardiac Doppler and Color Doppler Indications:    CHF-Acute Systolic I50.21  History:        Patient has prior history of Echocardiogram examinations, most                 recent 03/14/2022. CHF and Cardiomyopathy, CAD and Previous                 Myocardial Infarction, Prior CABG, COPD; Risk                 Factors:Hypertension and Dyslipidemia.  Sonographer:    Celesta Gentile RCS Referring Phys: [id number] OLADAPO ADEFESO IMPRESSIONS  1. Left ventricular ejection fraction, by estimation, is 40 to 45%. The left ventricle has mildly decreased function. The left ventricle demonstrates regional wall motion abnormalities (see scoring diagram/findings for description). Left ventricular diastolic parameters are  consistent with Grade III diastolic dysfunction (restrictive).  2. Right ventricular systolic function is normal. The right ventricular size is normal. There is moderately elevated pulmonary artery systolic pressure.  3. Left atrial size was severely dilated.  4. Right atrial size was mildly dilated.  5. The mitral valve is normal in structure. Mild mitral valve regurgitation. No evidence of mitral stenosis.  6. The aortic valve is tricuspid. Aortic valve regurgitation is not visualized. No aortic stenosis is present.  7. The inferior vena cava is normal in size with greater than 50% respiratory variability, suggesting right atrial pressure of 3 mmHg. Comparison(s): Changes from prior study are noted. LVEF worsened from 55% in 10/23 to 40-45% now. FINDINGS  Left Ventricle: Left ventricular ejection fraction, by estimation, is 40 to 45%. The left ventricle has mildly decreased function. The left ventricle demonstrates regional wall motion abnormalities. The left ventricular internal cavity size was normal in size. There is no left ventricular hypertrophy. Left ventricular diastolic parameters are consistent with Grade III diastolic dysfunction (restrictive).  LV Wall Scoring: The apex is akinetic. The entire anterior wall, antero-lateral wall, anterior septum, and apical lateral segment are hypokinetic. The inferior septum, entire inferior wall, and posterior wall are normal. Right Ventricle: The right ventricular size is normal. No increase in right ventricular wall thickness. Right ventricular systolic function is normal. There is moderately elevated pulmonary artery systolic pressure. The tricuspid regurgitant velocity is 3.32 m/s, and with an assumed right atrial pressure of 3 mmHg, the estimated right ventricular systolic pressure is 47.1 mmHg. Left Atrium: Left atrial size was severely dilated. Right Atrium: Right atrial size was mildly dilated. Pericardium: There is no evidence of pericardial effusion. Mitral  Valve: The mitral valve is normal in structure.  Mild mitral valve regurgitation. No evidence of mitral valve stenosis. Tricuspid Valve: The tricuspid valve is normal in structure. Tricuspid valve regurgitation is mild . No evidence of tricuspid stenosis. Aortic Valve: The aortic valve is tricuspid. Aortic valve regurgitation is not visualized. No aortic stenosis is present. Pulmonic Valve: The pulmonic valve was normal in structure. Pulmonic valve regurgitation is trivial. No evidence of pulmonic stenosis. Aorta: The aortic root is normal in size and structure. Venous: The inferior vena cava is normal in size with greater than 50% respiratory variability, suggesting right atrial pressure of 3 mmHg. IAS/Shunts: No atrial level shunt detected by color flow Doppler.  LEFT VENTRICLE PLAX 2D LVIDd:         4.90 cm   Diastology LVIDs:         3.50 cm   LV e' medial:    4.46 cm/s LV PW:         1.00 cm   LV E/e' medial:  20.3 LV IVS:        0.90 cm   LV e' lateral:   8.38 cm/s LVOT diam:     1.90 cm   LV E/e' lateral: 10.8 LV SV:         54 LV SV Index:   34 LVOT Area:     2.84 cm  RIGHT VENTRICLE RV S prime:     8.38 cm/s TAPSE (M-mode): 1.7 cm LEFT ATRIUM             Index        RIGHT ATRIUM           Index LA diam:        5.00 cm 3.16 cm/m   RA Area:     17.60 cm LA Vol (A2C):   73.7 ml 46.61 ml/m  RA Volume:   49.60 ml  31.37 ml/m LA Vol (A4C):   85.9 ml 54.33 ml/m LA Biplane Vol: 80.0 ml 50.60 ml/m  AORTIC VALVE LVOT Vmax:   82.50 cm/s LVOT Vmean:  55.400 cm/s LVOT VTI:    0.190 m  AORTA Ao Root diam: 3.00 cm MITRAL VALVE                  TRICUSPID VALVE MV Area (PHT): 4.15 cm       TR Peak grad:   44.1 mmHg MV Decel Time: 183 msec       TR Vmax:        332.00 cm/s MR Peak grad:    100.0 mmHg MR Mean grad:    63.0 mmHg    SHUNTS MR Vmax:         500.00 cm/s  Systemic VTI:  0.19 m MR Vmean:        368.0 cm/s   Systemic Diam: 1.90 cm MR PISA:         2.26 cm MR PISA Eff ROA: 11 mm MR PISA Radius:  0.60 cm MV  E velocity: 90.50 cm/s MV A velocity: 37.40 cm/s MV E/A ratio:  2.42 Taqwa Deem Priya Kolston [place] Electronically signed by Winfield Rast Raidon Swanner Signature Date/Time: 12/04/2022/1:37:32 PM    Final    CT Chest Wo Contrast  Result Date: 12/03/2022 CLINICAL DATA:  Chronic dyspnea of unclear etiology. Decreased energy over the past year. Swelling in the feet. Pleural effusion on x-ray. IV contrast allergy. EXAM: CT CHEST WITHOUT CONTRAST TECHNIQUE: Multidetector CT imaging of the chest was performed following the standard protocol without IV contrast. RADIATION DOSE REDUCTION: This exam was  performed according to the departmental dose-optimization program which includes automated exposure control, adjustment of the mA and/or kV according to patient size and/or use of iterative reconstruction technique. COMPARISON:  Chest radiograph 12/03/2022 FINDINGS: Cardiovascular: Normal heart size. No pericardial effusion. Normal caliber thoracic aorta. Calcification of the aorta and coronary arteries. Postoperative changes in the mediastinum consistent with coronary bypass. Mediastinum/Nodes: Thyroid gland is unremarkable. Esophagus is decompressed. Moderate-sized esophageal hiatal hernia. No significant lymphadenopathy. Lungs/Pleura: Mild dependent atelectasis in the lung bases. No airspace disease or consolidation. No interstitial infiltration. Bronchial wall thickening suggesting chronic bronchitis. No pleural effusions. No pneumothorax. Upper Abdomen: No acute abnormality. Musculoskeletal: Degenerative changes in the spine. Sternotomy wires. IMPRESSION: 1. No evidence of active pulmonary disease. 2. Coronary artery and aortic calcification post coronary bypass. 3. Moderate-sized esophageal hiatal hernia. Electronically Signed   By: Burman Nieves M.D.   On: 12/03/2022 23:37   DG Chest 2 View  Result Date: 12/03/2022 CLINICAL DATA:  Pedal edema EXAM: CHEST - 2 VIEW COMPARISON:  Radiograph 12/05/2021 FINDINGS: Stable  enlargement of the cardiomediastinal silhouette. Sternotomy and CABG. Aortic atherosclerotic calcification. Hyperinflation and chronic bronchitic changes. Small left pleural effusion. No focal consolidation pneumothorax. IMPRESSION: Small left pleural effusion. Emphysema. Cardiomegaly. Electronically Signed   By: Minerva Fester M.D.   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 BNP 710. Echocardiogram on admission showed LVEF 40 to 45% with RWMA (prior echo from 10/23 showed normal LVEF, 7/23 showed LVEF 25 to 30%, 6/23 showed 60 to 65%), G3 DD, normal RV function and mild MR. -CVP 3 mmHg, ReDs 20 this a.m.  Patient made 2.6L urine output in the last 24 hours with net -2.1 L on IV Lasix 60 mg twice daily. Stop IV Lasix and start p.o. torsemide 20 mg twice daily. -Home bisoprolol held due to HR 40 to 50s -Start Entresto 24-26 mg twice daily -Spironolactone can be initiated outpatient -Patient had cystitis when she was initiated on SGLT2 inhibitors in the prior admission. Not a candidate for SGLT2 inhibitors, hence. -No indication of LHC  # CAD s/p CABG in 2006, multiple PCI, angina free -Continue Plavix 100 mg once daily, atorvastatin 80 mg nightly, Zetia 10 mg once daily and Imdur 30 mg once daily.  [facility] will sign off.   Medication Recommendations: P.o. torsemide 20 mg twice daily, Entresto 24-26 mg twice daily.  Stop Lasix and bisoprolol upon discharge. Other recommendations (labs, testing, etc): None Follow up as an outpatient: Outpatient cardiology follow-up in 2 to 3 weeks upon discharge.    Signed, Marjo Bicker, MD  12/05/2022, 9:36 AM

## 2022-12-05 NOTE — Plan of Care (Signed)
  Problem: Clinical Measurements: Goal: Ability to maintain clinical measurements within normal limits will improve Outcome: Progressing   Problem: Clinical Measurements: Goal: Cardiovascular complication will be avoided Outcome: Progressing   Problem: Activity: Goal: Risk for activity intolerance will decrease Outcome: Progressing   

## 2022-12-06 LAB — T3, FREE: T3, Free: 2.4 pg/mL (ref 2.0–4.4)

## 2022-12-07 DIAGNOSIS — M359 Systemic involvement of connective tissue, unspecified: Secondary | ICD-10-CM | POA: Diagnosis not present

## 2022-12-07 DIAGNOSIS — F419 Anxiety disorder, unspecified: Secondary | ICD-10-CM | POA: Diagnosis not present

## 2022-12-07 DIAGNOSIS — K219 Gastro-esophageal reflux disease without esophagitis: Secondary | ICD-10-CM | POA: Diagnosis not present

## 2022-12-07 DIAGNOSIS — N8110 Cystocele, unspecified: Secondary | ICD-10-CM | POA: Diagnosis not present

## 2022-12-07 DIAGNOSIS — I5023 Acute on chronic systolic (congestive) heart failure: Secondary | ICD-10-CM | POA: Diagnosis not present

## 2022-12-07 DIAGNOSIS — I779 Disorder of arteries and arterioles, unspecified: Secondary | ICD-10-CM | POA: Diagnosis not present

## 2022-12-07 DIAGNOSIS — J4489 Other specified chronic obstructive pulmonary disease: Secondary | ICD-10-CM | POA: Diagnosis not present

## 2022-12-07 DIAGNOSIS — M1990 Unspecified osteoarthritis, unspecified site: Secondary | ICD-10-CM | POA: Diagnosis not present

## 2022-12-07 DIAGNOSIS — I110 Hypertensive heart disease with heart failure: Secondary | ICD-10-CM | POA: Diagnosis not present

## 2022-12-09 DIAGNOSIS — I10 Essential (primary) hypertension: Secondary | ICD-10-CM | POA: Diagnosis not present

## 2022-12-09 DIAGNOSIS — I5030 Unspecified diastolic (congestive) heart failure: Secondary | ICD-10-CM | POA: Diagnosis not present

## 2022-12-09 DIAGNOSIS — E7849 Other hyperlipidemia: Secondary | ICD-10-CM | POA: Diagnosis not present

## 2022-12-09 NOTE — Consult Note (Signed)
   [facility] Inpatient Consult   12/09/2022  DEVLYNN KNOFF 1940-06-28 [id number]  Primary Care Provider:  Donzetta Sprung  Patient is currently active with Care Management for chronic disease management services.  Patient has been engaged by a Gaffer.  Our community based plan of care has focused on disease management and community resource support.   Patient will receive a post hospital call and will be evaluated for assessments and disease process education.   Plan: Pt discharged home with [facility])   Of note, Care Management services does not replace or interfere with any services that are needed or arranged by inpatient [facility] care management team.   For additional questions or referrals please contact:  Elliot Cousin, RN, [facility] Liaison [place]   Population Health Office Hours MTWF  8:00 am-6:00 pm Off on Thursday [phone] mobile [phone] [Office toll free line] Office Hours are M-F 8:30 - 5 pm 24 hour nurse advise line [phone] Concierge  [email or web address]

## 2022-12-12 ENCOUNTER — Ambulatory Visit: Payer: Self-pay | Admitting: *Deleted

## 2022-12-12 DIAGNOSIS — F419 Anxiety disorder, unspecified: Secondary | ICD-10-CM | POA: Diagnosis not present

## 2022-12-12 DIAGNOSIS — M1990 Unspecified osteoarthritis, unspecified site: Secondary | ICD-10-CM | POA: Diagnosis not present

## 2022-12-12 DIAGNOSIS — N8110 Cystocele, unspecified: Secondary | ICD-10-CM | POA: Diagnosis not present

## 2022-12-12 DIAGNOSIS — M359 Systemic involvement of connective tissue, unspecified: Secondary | ICD-10-CM | POA: Diagnosis not present

## 2022-12-12 DIAGNOSIS — J4489 Other specified chronic obstructive pulmonary disease: Secondary | ICD-10-CM | POA: Diagnosis not present

## 2022-12-12 DIAGNOSIS — I779 Disorder of arteries and arterioles, unspecified: Secondary | ICD-10-CM | POA: Diagnosis not present

## 2022-12-12 DIAGNOSIS — I110 Hypertensive heart disease with heart failure: Secondary | ICD-10-CM | POA: Diagnosis not present

## 2022-12-12 DIAGNOSIS — K219 Gastro-esophageal reflux disease without esophagitis: Secondary | ICD-10-CM | POA: Diagnosis not present

## 2022-12-12 DIAGNOSIS — I5023 Acute on chronic systolic (congestive) heart failure: Secondary | ICD-10-CM | POA: Diagnosis not present

## 2022-12-13 ENCOUNTER — Encounter: Payer: Self-pay | Admitting: *Deleted

## 2022-12-13 NOTE — Patient Outreach (Signed)
Care Coordination   Follow Up Visit Note   12/12/2022 Name: Debbie [name] MRN: [id number] DOB: 11-07-1940  Debbie [name] is a 82 y.o. year old female who sees Richardean Chimera, MD for primary care. I spoke with  Debbie [name] by phone today.  What matters to the patients health and wellness today?  Managing blood pressure and straightening out her medications    Goals Addressed             This Visit's Progress    Care Coordination Services: Medication Management       Care Coordination Goals: Patient will keep all medical appointments Patient will take medications as prescribed Patient will consider a different system other than the monthly prepackaged meds since when medication changes happen, it is difficult for her to remove the correct ones Patient will reach out to RN Care Coordinator with any resource or care coordination needs [phone]      Manage Blood Pressure   On track    Care Coordination Goals: Patient will take medications as directed and report any negative side effects to provider  Patient will talk with provider about whether prepackaged meds are still appropriate for her since she has difficulty removing ones that have been discontinued Patient will monitor and record blood pressure daily and as needed and will call PCP or specialist with any readings outside of recommended range Patient will keep all recommended follow-up appointments with PCP and specialists  Patient will take blood pressure log to PCP and specialty appointments for review Patient will follow a low sodium/DASH diet  Patient will reach out to RN Care Coordinator [phone] with any care coordination or resource needs       Manage Heart Failure   On track    Care Coordination Goals: Patient will take medications as directed Patient will follow a low sodium/DASH Diet Patient will weigh and record weight each morning after urinating and call cardiologist with any weight  gain of >2 lbs overnight or >5 lbs in one week Patient will monitor for swelling in lower legs/feet and abdomen call cardiologist with any increase in swelling Patient will reach out to cardiologist with any increase in shortness of breath Patient will seek emergency medical attention if needed Patient will obtain a pulse ox and check oxygen level daily and as needed Patient will f/u with PCP as directed  Patient will reach out to [facility] at [phone] with any care coordination or resource needs          SDOH assessments and interventions completed:  Yes  SDOH Interventions Today    Flowsheet Row Most Recent Value  SDOH Interventions   Transportation Interventions Intervention Not Indicated  Financial Strain Interventions Intervention Not Indicated        Care Coordination Interventions:  Yes, provided  Interventions Today    Flowsheet Row Most Recent Value  Chronic Disease   Chronic disease during today's visit Hypertension (HTN)  General Interventions   General Interventions Discussed/Reviewed General Interventions Discussed, General Interventions Reviewed, Doctor Visits, Communication with  [Weight is stable 120, Blood pressure is 130/70 hr 75. Encouraged to ask PCP pulse ox order or purchase OTC so she can monitor O2 Sats]  Doctor Visits Discussed/Reviewed Doctor Visits Discussed, Doctor Visits Reviewed, PCP, Specialist  [reviewed and discussed hosp notes and d/c summary. Follow up with PCP within 2 weeks of hospital discharge and keep appt with cardio on 12/24/22]  Durable Medical Equipment (DME) BP Cuff  [scales]  PCP/Specialist  Visits Compliance with follow-up visit  Communication with PCP/Specialists  [SM to Sharlene Dory, NP (cardio) re: meds. D/c summary has torsemide 20g BID & Entresto BID but patient is taking both once a day & says she was told to do so b/c of hypotension. Call to [facility] to confirm [facility] PT & request nursing per order on 12/04/22]  Exercise  Interventions   Exercise Discussed/Reviewed Physical Activity  Physical Activity Discussed/Reviewed Physical Activity Discussed, Physical Activity Reviewed  [facility] PT through [facility] to start today]  Education Interventions   Education Provided Provided Education  Provided Verbal Education On Nutrition, Other, When to see the doctor  [blood pressure monitoring]  Nutrition Interventions   Nutrition Discussed/Reviewed Nutrition Discussed, Nutrition Reviewed, Fluid intake, Decreasing salt  Pharmacy Interventions   Pharmacy Dicussed/Reviewed Pharmacy Topics Discussed, Pharmacy Topics Reviewed, Medications and their functions  [thoroughly reviewed and reconcilled home medications with discharge meds. Advised to talk w/ PCP about whether ontly prepackaging is still a good idea for her since she has trouble removing d/c meds. Beta Blocker d/c due to bradycardia.]  Safety Interventions   Safety Discussed/Reviewed Safety Discussed, Safety Reviewed, Fall Risk, Home Safety       Follow up plan: Follow up call scheduled for 12/17/22    Encounter Outcome:  Pt. Visit Completed   Demetrios Loll, BSN, RN-BC RN Care Coordinator [facility]  [facility] Direct Dial: [phone] Main #: [phone]

## 2022-12-17 ENCOUNTER — Ambulatory Visit: Payer: Self-pay | Admitting: *Deleted

## 2022-12-17 NOTE — Patient Outreach (Signed)
  Care Coordination   12/17/2022 Name: Debbie [name] MRN: [id number] DOB: Jul 18, 1940   Care Coordination Outreach Attempts:  An unsuccessful telephone outreach was attempted for a scheduled appointment today.  Follow Up Plan:  Additional outreach attempts will be made to offer the patient care coordination information and services.   Encounter Outcome:  No Answer   Care Coordination Interventions:  No, not indicated    Demetrios Loll, BSN, RN-BC RN Care Coordinator [facility]  [facility] Direct Dial: [phone] Main #: [phone]

## 2022-12-18 DIAGNOSIS — I10 Essential (primary) hypertension: Secondary | ICD-10-CM | POA: Diagnosis not present

## 2022-12-18 DIAGNOSIS — K2100 Gastro-esophageal reflux disease with esophagitis, without bleeding: Secondary | ICD-10-CM | POA: Diagnosis not present

## 2022-12-19 DIAGNOSIS — R030 Elevated blood-pressure reading, without diagnosis of hypertension: Secondary | ICD-10-CM | POA: Diagnosis not present

## 2022-12-19 DIAGNOSIS — I509 Heart failure, unspecified: Secondary | ICD-10-CM | POA: Diagnosis not present

## 2022-12-19 DIAGNOSIS — J449 Chronic obstructive pulmonary disease, unspecified: Secondary | ICD-10-CM | POA: Diagnosis not present

## 2022-12-19 DIAGNOSIS — Z20828 Contact with and (suspected) exposure to other viral communicable diseases: Secondary | ICD-10-CM | POA: Diagnosis not present

## 2022-12-19 DIAGNOSIS — R051 Acute cough: Secondary | ICD-10-CM | POA: Diagnosis not present

## 2022-12-19 DIAGNOSIS — R0602 Shortness of breath: Secondary | ICD-10-CM | POA: Diagnosis not present

## 2022-12-19 DIAGNOSIS — Z6823 Body mass index (BMI) 23.0-23.9, adult: Secondary | ICD-10-CM | POA: Diagnosis not present

## 2022-12-23 ENCOUNTER — Encounter (HOSPITAL_COMMUNITY): Payer: Self-pay

## 2022-12-23 ENCOUNTER — Ambulatory Visit (HOSPITAL_COMMUNITY): Payer: Medicare HMO

## 2022-12-23 DIAGNOSIS — M359 Systemic involvement of connective tissue, unspecified: Secondary | ICD-10-CM | POA: Diagnosis not present

## 2022-12-23 DIAGNOSIS — I779 Disorder of arteries and arterioles, unspecified: Secondary | ICD-10-CM | POA: Diagnosis not present

## 2022-12-23 DIAGNOSIS — K219 Gastro-esophageal reflux disease without esophagitis: Secondary | ICD-10-CM | POA: Diagnosis not present

## 2022-12-23 DIAGNOSIS — M1990 Unspecified osteoarthritis, unspecified site: Secondary | ICD-10-CM | POA: Diagnosis not present

## 2022-12-23 DIAGNOSIS — J4489 Other specified chronic obstructive pulmonary disease: Secondary | ICD-10-CM | POA: Diagnosis not present

## 2022-12-23 DIAGNOSIS — I110 Hypertensive heart disease with heart failure: Secondary | ICD-10-CM | POA: Diagnosis not present

## 2022-12-23 DIAGNOSIS — N8110 Cystocele, unspecified: Secondary | ICD-10-CM | POA: Diagnosis not present

## 2022-12-23 DIAGNOSIS — F419 Anxiety disorder, unspecified: Secondary | ICD-10-CM | POA: Diagnosis not present

## 2022-12-23 DIAGNOSIS — I5023 Acute on chronic systolic (congestive) heart failure: Secondary | ICD-10-CM | POA: Diagnosis not present

## 2022-12-24 ENCOUNTER — Ambulatory Visit: Payer: Medicare HMO | Admitting: Nurse Practitioner

## 2022-12-25 DIAGNOSIS — M1611 Unilateral primary osteoarthritis, right hip: Secondary | ICD-10-CM | POA: Diagnosis not present

## 2022-12-25 DIAGNOSIS — M7061 Trochanteric bursitis, right hip: Secondary | ICD-10-CM | POA: Diagnosis not present

## 2022-12-25 DIAGNOSIS — R030 Elevated blood-pressure reading, without diagnosis of hypertension: Secondary | ICD-10-CM | POA: Diagnosis not present

## 2022-12-25 DIAGNOSIS — Z6822 Body mass index (BMI) 22.0-22.9, adult: Secondary | ICD-10-CM | POA: Diagnosis not present

## 2022-12-31 ENCOUNTER — Ambulatory Visit: Payer: Self-pay | Admitting: *Deleted

## 2022-12-31 NOTE — Patient Outreach (Signed)
  Care Coordination   12/31/2022 Name: Debbie [name] MRN: [id number] DOB: [birth year]   Care Coordination Outreach Attempts:  An unsuccessful telephone outreach was attempted for a scheduled appointment today.  Follow Up Plan:  Additional outreach attempts will be made to offer the patient care coordination information and services.   Encounter Outcome:  No Answer. Unable to leave a VM.   Care Coordination Interventions:  No, not indicated    Demetrios Loll, BSN, RN-BC RN Care Coordinator [facility]  [facility] Direct Dial: [phone] Main #: [phone]

## 2023-01-02 DIAGNOSIS — N8110 Cystocele, unspecified: Secondary | ICD-10-CM | POA: Diagnosis not present

## 2023-01-02 DIAGNOSIS — F419 Anxiety disorder, unspecified: Secondary | ICD-10-CM | POA: Diagnosis not present

## 2023-01-02 DIAGNOSIS — J4489 Other specified chronic obstructive pulmonary disease: Secondary | ICD-10-CM | POA: Diagnosis not present

## 2023-01-02 DIAGNOSIS — I110 Hypertensive heart disease with heart failure: Secondary | ICD-10-CM | POA: Diagnosis not present

## 2023-01-02 DIAGNOSIS — M1990 Unspecified osteoarthritis, unspecified site: Secondary | ICD-10-CM | POA: Diagnosis not present

## 2023-01-02 DIAGNOSIS — M359 Systemic involvement of connective tissue, unspecified: Secondary | ICD-10-CM | POA: Diagnosis not present

## 2023-01-02 DIAGNOSIS — K219 Gastro-esophageal reflux disease without esophagitis: Secondary | ICD-10-CM | POA: Diagnosis not present

## 2023-01-02 DIAGNOSIS — I779 Disorder of arteries and arterioles, unspecified: Secondary | ICD-10-CM | POA: Diagnosis not present

## 2023-01-02 DIAGNOSIS — I5023 Acute on chronic systolic (congestive) heart failure: Secondary | ICD-10-CM | POA: Diagnosis not present

## 2023-01-03 DIAGNOSIS — I5030 Unspecified diastolic (congestive) heart failure: Secondary | ICD-10-CM | POA: Diagnosis not present

## 2023-01-03 DIAGNOSIS — E7849 Other hyperlipidemia: Secondary | ICD-10-CM | POA: Diagnosis not present

## 2023-01-03 DIAGNOSIS — I10 Essential (primary) hypertension: Secondary | ICD-10-CM | POA: Diagnosis not present

## 2023-01-06 DIAGNOSIS — N8110 Cystocele, unspecified: Secondary | ICD-10-CM | POA: Diagnosis not present

## 2023-01-06 DIAGNOSIS — F419 Anxiety disorder, unspecified: Secondary | ICD-10-CM | POA: Diagnosis not present

## 2023-01-06 DIAGNOSIS — M359 Systemic involvement of connective tissue, unspecified: Secondary | ICD-10-CM | POA: Diagnosis not present

## 2023-01-06 DIAGNOSIS — Z6823 Body mass index (BMI) 23.0-23.9, adult: Secondary | ICD-10-CM | POA: Diagnosis not present

## 2023-01-06 DIAGNOSIS — T63441A Toxic effect of venom of bees, accidental (unintentional), initial encounter: Secondary | ICD-10-CM | POA: Diagnosis not present

## 2023-01-06 DIAGNOSIS — M1990 Unspecified osteoarthritis, unspecified site: Secondary | ICD-10-CM | POA: Diagnosis not present

## 2023-01-06 DIAGNOSIS — I5023 Acute on chronic systolic (congestive) heart failure: Secondary | ICD-10-CM | POA: Diagnosis not present

## 2023-01-06 DIAGNOSIS — R030 Elevated blood-pressure reading, without diagnosis of hypertension: Secondary | ICD-10-CM | POA: Diagnosis not present

## 2023-01-06 DIAGNOSIS — I110 Hypertensive heart disease with heart failure: Secondary | ICD-10-CM | POA: Diagnosis not present

## 2023-01-06 DIAGNOSIS — J4489 Other specified chronic obstructive pulmonary disease: Secondary | ICD-10-CM | POA: Diagnosis not present

## 2023-01-06 DIAGNOSIS — I779 Disorder of arteries and arterioles, unspecified: Secondary | ICD-10-CM | POA: Diagnosis not present

## 2023-01-06 DIAGNOSIS — K219 Gastro-esophageal reflux disease without esophagitis: Secondary | ICD-10-CM | POA: Diagnosis not present

## 2023-01-09 DIAGNOSIS — J4489 Other specified chronic obstructive pulmonary disease: Secondary | ICD-10-CM | POA: Diagnosis not present

## 2023-01-09 DIAGNOSIS — F419 Anxiety disorder, unspecified: Secondary | ICD-10-CM | POA: Diagnosis not present

## 2023-01-09 DIAGNOSIS — I110 Hypertensive heart disease with heart failure: Secondary | ICD-10-CM | POA: Diagnosis not present

## 2023-01-09 DIAGNOSIS — N8110 Cystocele, unspecified: Secondary | ICD-10-CM | POA: Diagnosis not present

## 2023-01-09 DIAGNOSIS — K219 Gastro-esophageal reflux disease without esophagitis: Secondary | ICD-10-CM | POA: Diagnosis not present

## 2023-01-09 DIAGNOSIS — I5023 Acute on chronic systolic (congestive) heart failure: Secondary | ICD-10-CM | POA: Diagnosis not present

## 2023-01-09 DIAGNOSIS — I779 Disorder of arteries and arterioles, unspecified: Secondary | ICD-10-CM | POA: Diagnosis not present

## 2023-01-09 DIAGNOSIS — M359 Systemic involvement of connective tissue, unspecified: Secondary | ICD-10-CM | POA: Diagnosis not present

## 2023-01-09 DIAGNOSIS — M1990 Unspecified osteoarthritis, unspecified site: Secondary | ICD-10-CM | POA: Diagnosis not present

## 2023-01-13 DIAGNOSIS — M1990 Unspecified osteoarthritis, unspecified site: Secondary | ICD-10-CM | POA: Diagnosis not present

## 2023-01-13 DIAGNOSIS — F419 Anxiety disorder, unspecified: Secondary | ICD-10-CM | POA: Diagnosis not present

## 2023-01-13 DIAGNOSIS — I110 Hypertensive heart disease with heart failure: Secondary | ICD-10-CM | POA: Diagnosis not present

## 2023-01-13 DIAGNOSIS — K219 Gastro-esophageal reflux disease without esophagitis: Secondary | ICD-10-CM | POA: Diagnosis not present

## 2023-01-13 DIAGNOSIS — M359 Systemic involvement of connective tissue, unspecified: Secondary | ICD-10-CM | POA: Diagnosis not present

## 2023-01-13 DIAGNOSIS — I5023 Acute on chronic systolic (congestive) heart failure: Secondary | ICD-10-CM | POA: Diagnosis not present

## 2023-01-13 DIAGNOSIS — J4489 Other specified chronic obstructive pulmonary disease: Secondary | ICD-10-CM | POA: Diagnosis not present

## 2023-01-13 DIAGNOSIS — I779 Disorder of arteries and arterioles, unspecified: Secondary | ICD-10-CM | POA: Diagnosis not present

## 2023-01-13 DIAGNOSIS — N8110 Cystocele, unspecified: Secondary | ICD-10-CM | POA: Diagnosis not present

## 2023-01-15 DIAGNOSIS — Z20822 Contact with and (suspected) exposure to covid-19: Secondary | ICD-10-CM | POA: Diagnosis not present

## 2023-01-15 DIAGNOSIS — I2510 Atherosclerotic heart disease of native coronary artery without angina pectoris: Secondary | ICD-10-CM | POA: Diagnosis not present

## 2023-01-15 DIAGNOSIS — R062 Wheezing: Secondary | ICD-10-CM | POA: Diagnosis not present

## 2023-01-15 DIAGNOSIS — I10 Essential (primary) hypertension: Secondary | ICD-10-CM | POA: Diagnosis not present

## 2023-01-15 DIAGNOSIS — I509 Heart failure, unspecified: Secondary | ICD-10-CM | POA: Diagnosis not present

## 2023-01-15 DIAGNOSIS — R0602 Shortness of breath: Secondary | ICD-10-CM | POA: Diagnosis not present

## 2023-01-15 DIAGNOSIS — Z880 Allergy status to penicillin: Secondary | ICD-10-CM | POA: Diagnosis not present

## 2023-01-15 DIAGNOSIS — E785 Hyperlipidemia, unspecified: Secondary | ICD-10-CM | POA: Diagnosis not present

## 2023-01-15 DIAGNOSIS — J189 Pneumonia, unspecified organism: Secondary | ICD-10-CM | POA: Diagnosis not present

## 2023-01-15 DIAGNOSIS — J441 Chronic obstructive pulmonary disease with (acute) exacerbation: Secondary | ICD-10-CM | POA: Diagnosis not present

## 2023-01-15 DIAGNOSIS — I110 Hypertensive heart disease with heart failure: Secondary | ICD-10-CM | POA: Diagnosis not present

## 2023-01-15 DIAGNOSIS — Z882 Allergy status to sulfonamides status: Secondary | ICD-10-CM | POA: Diagnosis not present

## 2023-01-15 DIAGNOSIS — R918 Other nonspecific abnormal finding of lung field: Secondary | ICD-10-CM | POA: Diagnosis not present

## 2023-01-15 DIAGNOSIS — R000 Tachycardia, unspecified: Secondary | ICD-10-CM | POA: Diagnosis not present

## 2023-01-15 DIAGNOSIS — R0989 Other specified symptoms and signs involving the circulatory and respiratory systems: Secondary | ICD-10-CM | POA: Diagnosis not present

## 2023-01-15 DIAGNOSIS — E079 Disorder of thyroid, unspecified: Secondary | ICD-10-CM | POA: Diagnosis not present

## 2023-01-17 DIAGNOSIS — R42 Dizziness and giddiness: Secondary | ICD-10-CM | POA: Diagnosis not present

## 2023-01-17 DIAGNOSIS — K2100 Gastro-esophageal reflux disease with esophagitis, without bleeding: Secondary | ICD-10-CM | POA: Diagnosis not present

## 2023-01-17 DIAGNOSIS — I5020 Unspecified systolic (congestive) heart failure: Secondary | ICD-10-CM | POA: Diagnosis not present

## 2023-01-17 DIAGNOSIS — E7849 Other hyperlipidemia: Secondary | ICD-10-CM | POA: Diagnosis not present

## 2023-01-17 DIAGNOSIS — N1831 Chronic kidney disease, stage 3a: Secondary | ICD-10-CM | POA: Diagnosis not present

## 2023-01-17 DIAGNOSIS — R5381 Other malaise: Secondary | ICD-10-CM | POA: Diagnosis not present

## 2023-01-17 DIAGNOSIS — F331 Major depressive disorder, recurrent, moderate: Secondary | ICD-10-CM | POA: Diagnosis not present

## 2023-01-17 DIAGNOSIS — J449 Chronic obstructive pulmonary disease, unspecified: Secondary | ICD-10-CM | POA: Diagnosis not present

## 2023-01-17 DIAGNOSIS — I10 Essential (primary) hypertension: Secondary | ICD-10-CM | POA: Diagnosis not present

## 2023-01-17 DIAGNOSIS — E1151 Type 2 diabetes mellitus with diabetic peripheral angiopathy without gangrene: Secondary | ICD-10-CM | POA: Diagnosis not present

## 2023-01-17 DIAGNOSIS — R5383 Other fatigue: Secondary | ICD-10-CM | POA: Diagnosis not present

## 2023-02-03 ENCOUNTER — Encounter: Payer: Self-pay | Admitting: Nurse Practitioner

## 2023-02-03 ENCOUNTER — Ambulatory Visit: Payer: Medicare HMO | Attending: Nurse Practitioner

## 2023-02-03 ENCOUNTER — Other Ambulatory Visit: Payer: Self-pay | Admitting: Nurse Practitioner

## 2023-02-03 ENCOUNTER — Telehealth: Payer: Self-pay | Admitting: Nurse Practitioner

## 2023-02-03 ENCOUNTER — Ambulatory Visit: Payer: Medicare HMO | Attending: Nurse Practitioner | Admitting: Nurse Practitioner

## 2023-02-03 VITALS — BP 130/60 | HR 63 | Ht 63.0 in | Wt 132.2 lb

## 2023-02-03 DIAGNOSIS — R0609 Other forms of dyspnea: Secondary | ICD-10-CM

## 2023-02-03 DIAGNOSIS — J449 Chronic obstructive pulmonary disease, unspecified: Secondary | ICD-10-CM | POA: Diagnosis not present

## 2023-02-03 DIAGNOSIS — I5043 Acute on chronic combined systolic (congestive) and diastolic (congestive) heart failure: Secondary | ICD-10-CM

## 2023-02-03 DIAGNOSIS — E782 Mixed hyperlipidemia: Secondary | ICD-10-CM

## 2023-02-03 DIAGNOSIS — R002 Palpitations: Secondary | ICD-10-CM | POA: Diagnosis not present

## 2023-02-03 DIAGNOSIS — I5042 Chronic combined systolic (congestive) and diastolic (congestive) heart failure: Secondary | ICD-10-CM | POA: Diagnosis not present

## 2023-02-03 DIAGNOSIS — E876 Hypokalemia: Secondary | ICD-10-CM

## 2023-02-03 DIAGNOSIS — I10 Essential (primary) hypertension: Secondary | ICD-10-CM | POA: Diagnosis not present

## 2023-02-03 DIAGNOSIS — E785 Hyperlipidemia, unspecified: Secondary | ICD-10-CM | POA: Diagnosis not present

## 2023-02-03 DIAGNOSIS — I5032 Chronic diastolic (congestive) heart failure: Secondary | ICD-10-CM

## 2023-02-03 DIAGNOSIS — R600 Localized edema: Secondary | ICD-10-CM

## 2023-02-03 DIAGNOSIS — R531 Weakness: Secondary | ICD-10-CM

## 2023-02-03 DIAGNOSIS — I2510 Atherosclerotic heart disease of native coronary artery without angina pectoris: Secondary | ICD-10-CM

## 2023-02-03 DIAGNOSIS — R319 Hematuria, unspecified: Secondary | ICD-10-CM

## 2023-02-03 DIAGNOSIS — Z79899 Other long term (current) drug therapy: Secondary | ICD-10-CM | POA: Diagnosis not present

## 2023-02-03 DIAGNOSIS — I5022 Chronic systolic (congestive) heart failure: Secondary | ICD-10-CM

## 2023-02-03 DIAGNOSIS — I272 Pulmonary hypertension, unspecified: Secondary | ICD-10-CM | POA: Diagnosis not present

## 2023-02-03 DIAGNOSIS — R413 Other amnesia: Secondary | ICD-10-CM

## 2023-02-03 NOTE — Telephone Encounter (Signed)
Checking percert on the following patient   ZIO XT Palpitations 7 days

## 2023-02-03 NOTE — Progress Notes (Unsigned)
Cardiology Office Note:  .   Date:  02/03/2023 ID:  Debbie [name], DOB [birth year], MRN [id number] PCP: Richardean Chimera, MD  [facility] Providers Cardiologist:  [name], MD    History of Present Illness: .   Debbie [name] is a 82 y.o. 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   Last seen by Randall An, PA-C on April 16, 2022. She was overall doing well at the time from a cardiac perspective. Note states Eliquis was d/c earlier 03/2022 d/t progressive bruising and d/t last echo 02/2022 showed loosely organized thrombotic material and slow flow at LV apex but no clearly formed mural thrombus. Reported improvement in symptoms at last OV since Eliquis was stopped.   I last saw patient for [facility] evaluation on 12/03/2022. Noted consistent weakness in the morning, blood pressure has been intermittently low in the mornings, 76/66 in the mornings, rechecked to be 119/64. Did not feel good, knew something was not right as she felt weakness right before she had her heart attack previously. Orthostatics negative in office. Noted stable, consistent shortness of breath related to her COPD. Denied any chest pain, however she never had chest pain with her past MI. Denied any palpitations, syncope, presyncope, dizziness, orthopnea, PND, or significant weight changes, acute bleeding, or claudication. Based on her symptoms, was sent to ED for further evaluation.   Hospital admission 11/2022 for CHF exacerbation. CXR showed small left pleural effusion. BNP 710. Tx with Lasix. Rest of ED workup overall negative. Zebeta was held d/t bradycardia. Entresto started. Recommended starting Aldactone in OP setting. Not a candidate for SGLT2i as she has hx of prior cystitis with medication.   ED visit on 8/28 for [facility]. CXR showed bilateral interstitial  and bibasilar patchy opacities, was treated with IV Levaquin, diagnosed with COPD acute exacerbation related to community-acquired pneumonia.  Today she presents for hospital follow-up.  She is doing better from when I last saw her.  She is only taking Entresto 1 tablet daily and taking torsemide 20 mg daily.  Taking 20 mill equivalents daily of potassium supplement.  She notes dyspnea on exertion, improved from last office visit.  She does admit to a sensation of fast heartbeats when laying down at night.  Admits to some weakness. Denies any chest pain, syncope, presyncope, dizziness, orthopnea, PND, swelling or significant weight changes, acute bleeding, or claudication.  SH: Worked on [employer] for 8 years.   Studies Reviewed: .    Echocardiogram 11/2022:    1. Left ventricular ejection fraction, by estimation, is 40 to 45%. The left ventricle has mildly decreased function. The left ventricle  demonstrates regional wall motion abnormalities (see scoring  diagram/findings for description). Left ventricular  diastolic parameters are consistent with Grade III diastolic dysfunction (restrictive).   2. Right ventricular systolic function is normal. The right ventricular  size is normal. There is moderately elevated pulmonary artery systolic pressure.   3. Left atrial size was severely dilated.   4. Right atrial size was mildly dilated.   5. The mitral valve is normal in structure. Mild mitral valve  regurgitation. No evidence of mitral stenosis.   6. The aortic valve is tricuspid. Aortic valve regurgitation is not  visualized. No aortic stenosis is present.   7. The inferior vena cava is normal in size with greater than  50%  respiratory variability, suggesting right atrial pressure of 3 mmHg.   Comparison(s): Changes from prior study are noted. LVEF worsened from 55%  in 10/23 to 40-45% now.   Limited echo 02/2022: 1. Limited study.   2. Left ventricular ejection fraction, by estimation,  is 55 to 60%. The  left ventricle has normal function. The left ventricle demonstrates  regional wall motion abnormalities (see scoring diagram/findings for  description).   3. Definity contrast defines loosely organized thormbotic material and  slow flow at LV apex, but no clearly formed mural thrombus.   4. Right ventricular systolic function is normal. The right ventricular  size is normal.   5. The aortic valve is tricuspid. Aortic valve regurgitation is not  visualized.   Comparison(s): Prior images reviewed side by side. LVEF has improved and  although LV apical thrombotic material and slow flow are noted, there is  no clearly formed mural thrombus.  LHC 11/24/2021:    Dist LM to Prox LAD lesion is 100% stenosed.   Ost LM to Dist LM lesion is 25% stenosed.   Dist Cx lesion is 50% stenosed.   Origin to Prox Graft lesion is 99% stenosed.   Previously placed Ost Cx to Prox Cx stent of unknown type is  widely patent.   A drug-eluting stent was successfully placed using a STENT ONYX FRONTIER 3.0X18.   Post intervention, there is a 0% residual stenosis.  LHC 11/23/2021:  1. Severe native vessel coronary artery disease with severe ostial left main stenosis, total occlusion of the LAD, severe stenosis in the distal left main/circumflex junction, severe diffuse stenosis of the distal AV circumflex, and a diffusely diseased nondominant RCA 2. Status post aortocoronary bypass surgery with continued patency of the LIMA to LAD and occlusion of the saphenous vein grafts 3. Successful PCI of the native left main and native circumflex using a 4.0 x 24 mm Synergy DES  Recommendation: Dual antiplatelet therapy with aspirin and ticagrelor at least 12 months, aggressive risk reduction measures, suspect the patient occluded a vein graft causing her acute coronary syndrome. There were no vein graft targets for PCI. Her native left main into the circumflex is treated as this large territory is now  potentially ischemic after occlusion of the sequential saphenous vein graft to OM and PLA.  Physical Exam:   VS:  BP 130/60   Pulse 63   Ht 5\' 3"  (1.6 m)   Wt 132 lb 3.2 oz (60 kg)   SpO2 97%   BMI 23.42 kg/m    Wt Readings from Last 3 Encounters:  02/03/23 132 lb 3.2 oz (60 kg)  12/05/22 130 lb 11.7 oz (59.3 kg)  12/03/22 128 lb 9.6 oz (58.3 kg)    GEN: Well nourished, well developed in no acute distress NECK: No JVD; No carotid bruits CARDIAC: S1/S2, RRR, no murmurs, rubs, gallops RESPIRATORY:  Clear to auscultation without rales, wheezing or rhonchi  ABDOMEN: Soft, non-tender, non-distended EXTREMITIES:  Trace, nonpitting edema; No deformity   ASSESSMENT AND PLAN: .    Chronic combined CHF, HFmrEF, leg edema, medication management Stage C, NYHA class II-III symptoms. EF 40-45%, grade 3 DD. Overall euvolemic and well compensated on exam, does note some leg edema- see PE above.  Will increase torsemide to 20 mg twice daily for 3 days, then reduce torsemide to 20 mg daily.  Continue potassium supplement.  In 1 week, we will obtain BMET, magnesium, proBNP.  Discussed with her to take Entresto 1 tablet (current  dosage) twice a day.  Continue Imdur.  Based on labs that will be obtained next week, plan to add MRA at next office visit. Low sodium diet, fluid restriction <2L, and daily weights encouraged. Educated to contact our office for weight gain of 2 lbs overnight or 5 lbs in one week.  CAD, s/p CABG and s/p NSTEMI Denies any chest pain. No indication for ischemic evaluation.  Continue Plavix, Imdur, nitroglycerin as needed, and instructed to take Entresto twice a day instead of once a day.  She is not on beta-blocker due to history of bradycardia. Heart healthy diet encouraged.  Care and ED precautions discussed.  HTN Blood pressure stable and at goal.  Instructed to take Entresto twice a day instead of once a day.  Increasing torsemide as mentioned above.  Will be getting labs in 1  week as mentioned above.  No other medication changes at this time. Discussed to monitor BP at home at least 2 hours after medications and sitting for 5-10 minutes. Heart healthy diet encouraged.   HLD LDL 1 year ago 67.  Continue atorvastatin.  Not addressed, but plan to request labs at next office visit from PCP. Heart healthy diet encouraged.   5. Palpitations Admits to nightly sensation of palpitations while laying down at night.  Etiology unclear.  She is no longer on beta-blocker as she has had some bradycardia.  Will arrange ZIO monitor for further evaluation.  6. Pulmonary HTN, COPD, DOE She admits that her breathing has improved since I last saw her in the office.  Recent echo revealed moderately elevated PASP.  Most likely related to [facility] group 2 and group 3.  Increasing torsemide as mentioned above.  Recommended to take Entresto twice a day instead of once a day.  At next office visit, plan to discuss starting MRA. Continue to follow with PCP. Recommended to follow-up with Pulmonology.  7. Weakness Patient admits to some generalized weakness, and I recommended home health therapy to improve her strength and mobility.  Will contact Nilsa Nutting with [facility] for referral as patient is agreeable.   Signed, Sharlene Dory, NP

## 2023-02-03 NOTE — Patient Instructions (Addendum)
Medication Instructions:  Your physician has recommended you make the following change in your medication:  Increase Torsemide to 20 Mg twice daily for 3 days, then reduce back down to 20 Mg Daily  Continue all other medications as prescribed   Labwork: BMET, MAG, BNP 1 week   Testing/Procedures: Your physician has recommended that you wear a Zio monitor.   This monitor is a medical device that records the heart's electrical activity. Doctors most often use these monitors to diagnose arrhythmias. Arrhythmias are problems with the speed or rhythm of the heartbeat. The monitor is a small device applied to your chest. You can wear one while you do your normal daily activities. While wearing this monitor if you have any symptoms to push the button and record what you felt. Once you have worn this monitor for the period of time provider prescribed (for 7 days), you will return the monitor device in the postage paid box. Once it is returned they will download the data collected and provide [place] with a report which the provider will then review and we will call you with those results. Important tips:  Avoid showering during the first 24 hours of wearing the monitor. Avoid excessive sweating to help maximize wear time. Do not submerge the device, no hot tubs, and no swimming pools. Keep any lotions or oils away from the patch. After 24 hours you may shower with the patch on. Take brief showers with your back facing the shower head.  Do not remove patch once it has been placed because that will interrupt data and decrease adhesive wear time. Push the button when you have any symptoms and write down what you were feeling. Once you have completed wearing your monitor, remove and place into box which has postage paid and place in your outgoing mailbox.  If for some reason you have misplaced your box then call our office and we can provide another box and/or mail it off for you. Follow-Up: Your physician  recommends that you schedule a follow-up appointment in: 4-6 weeks   Any Other Special Instructions Will Be Listed Below (If Applicable).  If you need a refill on your cardiac medications before your next appointment, please call your pharmacy.

## 2023-02-11 DIAGNOSIS — E1122 Type 2 diabetes mellitus with diabetic chronic kidney disease: Secondary | ICD-10-CM | POA: Diagnosis not present

## 2023-02-11 DIAGNOSIS — N1831 Chronic kidney disease, stage 3a: Secondary | ICD-10-CM | POA: Diagnosis not present

## 2023-02-11 DIAGNOSIS — E1165 Type 2 diabetes mellitus with hyperglycemia: Secondary | ICD-10-CM | POA: Diagnosis not present

## 2023-02-11 DIAGNOSIS — I5030 Unspecified diastolic (congestive) heart failure: Secondary | ICD-10-CM | POA: Diagnosis not present

## 2023-02-11 DIAGNOSIS — E1151 Type 2 diabetes mellitus with diabetic peripheral angiopathy without gangrene: Secondary | ICD-10-CM | POA: Diagnosis not present

## 2023-02-11 DIAGNOSIS — E7849 Other hyperlipidemia: Secondary | ICD-10-CM | POA: Diagnosis not present

## 2023-02-11 DIAGNOSIS — E039 Hypothyroidism, unspecified: Secondary | ICD-10-CM | POA: Diagnosis not present

## 2023-02-17 DIAGNOSIS — E1122 Type 2 diabetes mellitus with diabetic chronic kidney disease: Secondary | ICD-10-CM | POA: Diagnosis not present

## 2023-02-17 DIAGNOSIS — E782 Mixed hyperlipidemia: Secondary | ICD-10-CM | POA: Diagnosis not present

## 2023-02-17 DIAGNOSIS — E039 Hypothyroidism, unspecified: Secondary | ICD-10-CM | POA: Diagnosis not present

## 2023-02-18 DIAGNOSIS — R002 Palpitations: Secondary | ICD-10-CM | POA: Diagnosis not present

## 2023-02-24 DIAGNOSIS — J449 Chronic obstructive pulmonary disease, unspecified: Secondary | ICD-10-CM | POA: Diagnosis not present

## 2023-02-24 DIAGNOSIS — Z0001 Encounter for general adult medical examination with abnormal findings: Secondary | ICD-10-CM | POA: Diagnosis not present

## 2023-02-24 DIAGNOSIS — N182 Chronic kidney disease, stage 2 (mild): Secondary | ICD-10-CM | POA: Diagnosis not present

## 2023-02-24 DIAGNOSIS — I10 Essential (primary) hypertension: Secondary | ICD-10-CM | POA: Diagnosis not present

## 2023-02-24 DIAGNOSIS — E785 Hyperlipidemia, unspecified: Secondary | ICD-10-CM | POA: Diagnosis not present

## 2023-02-24 DIAGNOSIS — I25111 Atherosclerotic heart disease of native coronary artery with angina pectoris with documented spasm: Secondary | ICD-10-CM | POA: Diagnosis not present

## 2023-02-24 DIAGNOSIS — I2510 Atherosclerotic heart disease of native coronary artery without angina pectoris: Secondary | ICD-10-CM | POA: Diagnosis not present

## 2023-02-24 DIAGNOSIS — E039 Hypothyroidism, unspecified: Secondary | ICD-10-CM | POA: Diagnosis not present

## 2023-02-24 DIAGNOSIS — D649 Anemia, unspecified: Secondary | ICD-10-CM | POA: Diagnosis not present

## 2023-03-04 ENCOUNTER — Ambulatory Visit: Payer: Medicare HMO | Admitting: Nurse Practitioner

## 2023-03-04 NOTE — Progress Notes (Deleted)
Cardiology Office Note:  .   Date:  02/03/2023 ID:  Debbie [name], DOB 1940/11/25, MRN [id number] PCP: Richardean Chimera, MD  [name] Providers Cardiologist:  [name], MD    History of Present Illness: .   Debbie [name] is a 82 y.o. 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   Last seen by Debbie An, PA-C on April 16, 2022. She was overall doing well at the time from a cardiac perspective. Note states Debbie [name] was d/c earlier 03/2022 d/t progressive bruising and d/t last echo 02/2022 showed loosely organized thrombotic material and slow flow at LV apex but no clearly formed mural thrombus. Reported improvement in symptoms at last OV since Debbie [name] was stopped.   I last saw patient for [facility] evaluation on 12/03/2022. Noted consistent weakness in the morning, blood pressure has been intermittently low in the mornings, 76/66 in the mornings, rechecked to be 119/64. Did not feel good, knew something was not right as she felt weakness right before she had her heart attack previously. Orthostatics negative in office. Noted stable, consistent shortness of breath related to her COPD. Denied any chest pain, however she never had chest pain with her past MI. Denied any palpitations, syncope, presyncope, dizziness, orthopnea, PND, or significant weight changes, acute bleeding, or claudication. Based on her symptoms, was sent to ED for further evaluation.   Hospital admission 11/2022 for CHF exacerbation. CXR showed small left pleural effusion. BNP 710. Tx with Lasix. Rest of ED workup overall negative. Zebeta was held d/t bradycardia. Entresto started. Recommended starting Aldactone in OP setting. Not a candidate for SGLT2i as she has hx of prior cystitis with medication.   ED visit on 8/28 for Debbie [name]. CXR showed bilateral interstitial  and bibasilar patchy opacities, was treated with IV Levaquin, diagnosed with COPD acute exacerbation related to community-acquired pneumonia.  Today she presents for hospital follow-up.  She is doing better from when I last saw her.  She is only taking Entresto 1 tablet daily and taking torsemide 20 mg daily.  Taking 20 mill equivalents daily of potassium supplement.  She notes dyspnea on exertion, improved from last office visit.  She does admit to a sensation of fast heartbeats when laying down at night.  Admits to some weakness. Denies any chest pain, syncope, presyncope, dizziness, orthopnea, PND, swelling or significant weight changes, acute bleeding, or claudication.  SH: Worked on [employer] for 8 years.   Studies Reviewed: .    Echocardiogram 11/2022:    1. Left ventricular ejection fraction, by estimation, is 40 to 45%. The left ventricle has mildly decreased function. The left ventricle  demonstrates regional wall motion abnormalities (see scoring  diagram/findings for description). Left ventricular  diastolic parameters are consistent with Grade III diastolic dysfunction (restrictive).   2. Right ventricular systolic function is normal. The right ventricular  size is normal. There is moderately elevated pulmonary artery systolic pressure.   3. Left atrial size was severely dilated.   4. Right atrial size was mildly dilated.   5. The mitral valve is normal in structure. Mild mitral valve  regurgitation. No evidence of mitral stenosis.   6. The aortic valve is tricuspid. Aortic valve regurgitation is not  visualized. No aortic stenosis is present.   7. The inferior vena cava is normal in size with greater than  50%  respiratory variability, suggesting right atrial pressure of 3 mmHg.   Comparison(s): Changes from prior study are noted. LVEF worsened from 55%  in 10/23 to 40-45% now.   Limited echo 02/2022: 1. Limited study.   2. Left ventricular ejection fraction, by estimation,  is 55 to 60%. The  left ventricle has normal function. The left ventricle demonstrates  regional wall motion abnormalities (see scoring diagram/findings for  description).   3. Definity contrast defines loosely organized thormbotic material and  slow flow at LV apex, but no clearly formed mural thrombus.   4. Right ventricular systolic function is normal. The right ventricular  size is normal.   5. The aortic valve is tricuspid. Aortic valve regurgitation is not  visualized.   Comparison(s): Prior images reviewed side by side. LVEF has improved and  although LV apical thrombotic material and slow flow are noted, there is  no clearly formed mural thrombus.  LHC 11/24/2021:    Dist LM to Prox LAD lesion is 100% stenosed.   Ost LM to Dist LM lesion is 25% stenosed.   Dist Cx lesion is 50% stenosed.   Origin to Prox Graft lesion is 99% stenosed.   Previously placed Ost Cx to Prox Cx stent of unknown type is  widely patent.   A drug-eluting stent was successfully placed using a STENT ONYX FRONTIER 3.0X18.   Post intervention, there is a 0% residual stenosis.  LHC 11/23/2021:  1. Severe native vessel coronary artery disease with severe ostial left main stenosis, total occlusion of the LAD, severe stenosis in the distal left main/circumflex junction, severe diffuse stenosis of the distal AV circumflex, and a diffusely diseased nondominant RCA 2. Status post aortocoronary bypass surgery with continued patency of the LIMA to LAD and occlusion of the saphenous vein grafts 3. Successful PCI of the native left main and native circumflex using a 4.0 x 24 mm Synergy DES  Recommendation: Dual antiplatelet therapy with aspirin and ticagrelor at least 12 months, aggressive risk reduction measures, suspect the patient occluded a vein graft causing her acute coronary syndrome. There were no vein graft targets for PCI. Her native left main into the circumflex is treated as this large territory is now  potentially ischemic after occlusion of the sequential saphenous vein graft to OM and PLA.  Physical Exam:   VS:  There were no vitals taken for this visit.   Wt Readings from Last 3 Encounters:  02/03/23 132 lb 3.2 oz (60 kg)  12/05/22 130 lb 11.7 oz (59.3 kg)  12/03/22 128 lb 9.6 oz (58.3 kg)    GEN: Well nourished, well developed in no acute distress NECK: No JVD; No carotid bruits CARDIAC: S1/S2, RRR, no murmurs, rubs, gallops RESPIRATORY:  Clear to auscultation without rales, wheezing or rhonchi  ABDOMEN: Soft, non-tender, non-distended EXTREMITIES:  Trace, nonpitting edema; No deformity   ASSESSMENT AND PLAN: .    Chronic combined CHF, HFmrEF, leg edema, medication management Stage C, NYHA class II-III symptoms. EF 40-45%, grade 3 DD. Overall euvolemic and well compensated on exam, does note some leg edema- see PE above.  Will increase torsemide to 20 mg twice daily for 3 days, then reduce torsemide to 20 mg daily.  Continue potassium supplement.  In 1 week, we will obtain BMET, magnesium, proBNP.  Discussed with her to take Entresto 1 tablet (current dosage) twice a day.  Continue Imdur.  Based on labs that will be obtained next week, plan to add MRA at next office  visit. Low sodium diet, fluid restriction <2L, and daily weights encouraged. Educated to contact our office for weight gain of 2 lbs overnight or 5 lbs in one week.  CAD, s/p CABG and s/p NSTEMI Denies any chest pain. No indication for ischemic evaluation.  Continue Plavix, Imdur, nitroglycerin as needed, and instructed to take Entresto twice a day instead of once a day.  She is not on beta-blocker due to history of bradycardia. Heart healthy diet encouraged.  Care and ED precautions discussed.  HTN Blood pressure stable and at goal.  Instructed to take Entresto twice a day instead of once a day.  Increasing torsemide as mentioned above.  Will be getting labs in 1 week as mentioned above.  No other medication changes at  this time. Discussed to monitor BP at home at least 2 hours after medications and sitting for 5-10 minutes. Heart healthy diet encouraged.   HLD LDL 1 year ago 67.  Continue atorvastatin.  Not addressed, but plan to request labs at next office visit from PCP. Heart healthy diet encouraged.   5. Palpitations Admits to nightly sensation of palpitations while laying down at night.  Etiology unclear.  She is no longer on beta-blocker as she has had some bradycardia.  Will arrange ZIO monitor for further evaluation.  6. Pulmonary HTN, COPD, DOE She admits that her breathing has improved since I last saw her in the office.  Recent echo revealed moderately elevated PASP.  Most likely related to [facility] group 2 and group 3.  Increasing torsemide as mentioned above.  Recommended to take Entresto twice a day instead of once a day.  At next office visit, plan to discuss starting MRA. Continue to follow with PCP. Recommended to follow-up with Pulmonology.  7. Weakness Patient admits to some generalized weakness, and I recommended home health therapy to improve her strength and mobility.  Will contact Nilsa Nutting with [name] for referral as patient is agreeable.   Signed, Sharlene Dory, NP

## 2023-03-12 DIAGNOSIS — R030 Elevated blood-pressure reading, without diagnosis of hypertension: Secondary | ICD-10-CM | POA: Diagnosis not present

## 2023-03-12 DIAGNOSIS — Z6822 Body mass index (BMI) 22.0-22.9, adult: Secondary | ICD-10-CM | POA: Diagnosis not present

## 2023-03-12 DIAGNOSIS — I739 Peripheral vascular disease, unspecified: Secondary | ICD-10-CM | POA: Diagnosis not present

## 2023-03-27 DIAGNOSIS — M79605 Pain in left leg: Secondary | ICD-10-CM | POA: Diagnosis not present

## 2023-03-27 DIAGNOSIS — M79604 Pain in right leg: Secondary | ICD-10-CM | POA: Diagnosis not present

## 2023-04-09 ENCOUNTER — Telehealth: Payer: Self-pay | Admitting: Cardiology

## 2023-04-09 NOTE — Telephone Encounter (Signed)
Pharmacist returning nurses phone call. Please advise

## 2023-04-09 NOTE — Telephone Encounter (Signed)
Returned call, no answer, left msg to call back.

## 2023-04-09 NOTE — Telephone Encounter (Signed)
Pt c/o medication issue:  1. Name of Medication:   sacubitril-valsartan (ENTRESTO) 24-26 MG (Expired)    2. How are you currently taking this medication (dosage and times per day)?   3. Are you having a reaction (difficulty breathing--STAT)? No  4. What is your medication issue? Office would like to know if the pt should be currently taking this medication. Please advise

## 2023-04-10 NOTE — Telephone Encounter (Signed)
Advised pharmacist per last patient visit patient was taking this medication twice daily. [facility] is handling PAF for this medication for patient

## 2023-04-18 DIAGNOSIS — I5020 Unspecified systolic (congestive) heart failure: Secondary | ICD-10-CM | POA: Diagnosis not present

## 2023-04-18 DIAGNOSIS — E782 Mixed hyperlipidemia: Secondary | ICD-10-CM | POA: Diagnosis not present

## 2023-04-18 DIAGNOSIS — E039 Hypothyroidism, unspecified: Secondary | ICD-10-CM | POA: Diagnosis not present

## 2023-05-08 DIAGNOSIS — E039 Hypothyroidism, unspecified: Secondary | ICD-10-CM | POA: Diagnosis not present

## 2023-05-08 DIAGNOSIS — Z0001 Encounter for general adult medical examination with abnormal findings: Secondary | ICD-10-CM | POA: Diagnosis not present

## 2023-05-08 DIAGNOSIS — E7849 Other hyperlipidemia: Secondary | ICD-10-CM | POA: Diagnosis not present

## 2023-05-08 DIAGNOSIS — I5020 Unspecified systolic (congestive) heart failure: Secondary | ICD-10-CM | POA: Diagnosis not present

## 2023-05-08 DIAGNOSIS — J449 Chronic obstructive pulmonary disease, unspecified: Secondary | ICD-10-CM | POA: Diagnosis not present

## 2023-05-08 DIAGNOSIS — I10 Essential (primary) hypertension: Secondary | ICD-10-CM | POA: Diagnosis not present

## 2023-05-08 DIAGNOSIS — I25119 Atherosclerotic heart disease of native coronary artery with unspecified angina pectoris: Secondary | ICD-10-CM | POA: Diagnosis not present

## 2023-05-08 DIAGNOSIS — E1151 Type 2 diabetes mellitus with diabetic peripheral angiopathy without gangrene: Secondary | ICD-10-CM | POA: Diagnosis not present

## 2023-05-08 DIAGNOSIS — N1831 Chronic kidney disease, stage 3a: Secondary | ICD-10-CM | POA: Diagnosis not present

## 2023-10-06 DIAGNOSIS — Z6822 Body mass index (BMI) 22.0-22.9, adult: Secondary | ICD-10-CM | POA: Diagnosis not present

## 2023-10-06 DIAGNOSIS — J449 Chronic obstructive pulmonary disease, unspecified: Secondary | ICD-10-CM | POA: Diagnosis not present

## 2023-10-06 DIAGNOSIS — R051 Acute cough: Secondary | ICD-10-CM | POA: Diagnosis not present

## 2023-10-17 DIAGNOSIS — E1122 Type 2 diabetes mellitus with diabetic chronic kidney disease: Secondary | ICD-10-CM | POA: Diagnosis not present

## 2023-10-17 DIAGNOSIS — J449 Chronic obstructive pulmonary disease, unspecified: Secondary | ICD-10-CM | POA: Diagnosis not present

## 2023-10-17 DIAGNOSIS — I5020 Unspecified systolic (congestive) heart failure: Secondary | ICD-10-CM | POA: Diagnosis not present

## 2023-10-17 DIAGNOSIS — E782 Mixed hyperlipidemia: Secondary | ICD-10-CM | POA: Diagnosis not present

## 2023-10-27 DIAGNOSIS — N3281 Overactive bladder: Secondary | ICD-10-CM | POA: Diagnosis not present

## 2023-10-27 DIAGNOSIS — R300 Dysuria: Secondary | ICD-10-CM | POA: Diagnosis not present

## 2023-10-27 DIAGNOSIS — Z6822 Body mass index (BMI) 22.0-22.9, adult: Secondary | ICD-10-CM | POA: Diagnosis not present

## 2023-11-17 DIAGNOSIS — E782 Mixed hyperlipidemia: Secondary | ICD-10-CM | POA: Diagnosis not present

## 2023-11-17 DIAGNOSIS — J449 Chronic obstructive pulmonary disease, unspecified: Secondary | ICD-10-CM | POA: Diagnosis not present

## 2023-11-17 DIAGNOSIS — E1122 Type 2 diabetes mellitus with diabetic chronic kidney disease: Secondary | ICD-10-CM | POA: Diagnosis not present

## 2023-11-17 DIAGNOSIS — I5020 Unspecified systolic (congestive) heart failure: Secondary | ICD-10-CM | POA: Diagnosis not present

## 2023-11-20 DIAGNOSIS — R079 Chest pain, unspecified: Secondary | ICD-10-CM | POA: Diagnosis not present

## 2023-11-20 DIAGNOSIS — Z6822 Body mass index (BMI) 22.0-22.9, adult: Secondary | ICD-10-CM | POA: Diagnosis not present

## 2023-11-20 DIAGNOSIS — R5383 Other fatigue: Secondary | ICD-10-CM | POA: Diagnosis not present

## 2023-11-20 DIAGNOSIS — R0602 Shortness of breath: Secondary | ICD-10-CM | POA: Diagnosis not present

## 2023-12-18 ENCOUNTER — Encounter: Payer: Self-pay | Admitting: Internal Medicine

## 2023-12-18 DIAGNOSIS — I5020 Unspecified systolic (congestive) heart failure: Secondary | ICD-10-CM | POA: Diagnosis not present

## 2023-12-18 DIAGNOSIS — E1122 Type 2 diabetes mellitus with diabetic chronic kidney disease: Secondary | ICD-10-CM | POA: Diagnosis not present

## 2023-12-18 DIAGNOSIS — J449 Chronic obstructive pulmonary disease, unspecified: Secondary | ICD-10-CM | POA: Diagnosis not present

## 2023-12-18 DIAGNOSIS — E782 Mixed hyperlipidemia: Secondary | ICD-10-CM | POA: Diagnosis not present

## 2023-12-19 DIAGNOSIS — R0602 Shortness of breath: Secondary | ICD-10-CM | POA: Diagnosis not present

## 2023-12-19 DIAGNOSIS — Z6821 Body mass index (BMI) 21.0-21.9, adult: Secondary | ICD-10-CM | POA: Diagnosis not present

## 2023-12-23 DIAGNOSIS — M25561 Pain in right knee: Secondary | ICD-10-CM | POA: Diagnosis not present

## 2023-12-23 DIAGNOSIS — M6281 Muscle weakness (generalized): Secondary | ICD-10-CM | POA: Diagnosis not present

## 2023-12-23 DIAGNOSIS — M25551 Pain in right hip: Secondary | ICD-10-CM | POA: Diagnosis not present

## 2023-12-26 ENCOUNTER — Ambulatory Visit: Admitting: Pulmonary Disease

## 2023-12-26 NOTE — Progress Notes (Deleted)
 New Patient Pulmonology Office Visit   Subjective:  Patient ID: TAVIA STAVE, female    DOB: 11-Jun-1940  MRN: [id number]  Referred by: [name], MD  CC: No chief complaint on file.   HPI Debbie [name] is a 83 y.o. female with PMH significant for CAD s/p CABG and COPD (FEV1 1.16 L , 63% predicted) who presents for initial consultation.  {PULM QUESTIONNAIRES (Optional):33196}  ROS  Allergies: Ivp dye [iodinated contrast media], Betadine [povidone iodine], Codeine, Other, Penicillins, and Sulfa antibiotics  Current Outpatient Medications:    acetaminophen  (TYLENOL ) 650 MG CR tablet, Take 650 mg by mouth in the morning., Disp: , Rfl:    atorvastatin  (LIPITOR ) 80 MG tablet, Take 1 tablet (80 mg total) by mouth at bedtime., Disp: 30 tablet, Rfl: 3   clopidogrel  (PLAVIX ) 75 MG tablet, Take 1 tablet (75 mg total) by mouth daily., Disp: 30 tablet, Rfl: 0   isosorbide  mononitrate (IMDUR ) 30 MG 24 hr tablet, Take 1 tablet (30 mg total) by mouth daily., Disp: 30 tablet, Rfl: 1   levocetirizine (XYZAL) 5 MG tablet, Take 5 mg by mouth every evening. Equate levocetirizine., Disp: , Rfl:    levothyroxine  (SYNTHROID ) 88 MCG tablet, Take 88 mcg by mouth daily., Disp: , Rfl:    nitroGLYCERIN  (NITROSTAT ) 0.4 MG SL tablet, Place 1 tablet (0.4 mg total) under the tongue every 5 (five) minutes x 3 doses as needed for chest pain (if no relief after 3rd dose, call 911 or proceed to the ED for an evaluation)., Disp: 25 tablet, Rfl: 2   pantoprazole  (PROTONIX ) 40 MG tablet, Take 40 mg by mouth daily., Disp: , Rfl:    potassium chloride  (KLOR-CON  M) 10 MEQ tablet, Take 1 tablet (10 mEq total) by mouth daily. (Patient taking differently: Take 20 mEq by mouth daily.), Disp: 120 tablet, Rfl: 3   tamsulosin  (FLOMAX ) 0.4 MG CAPS capsule, Take 0.4 mg by mouth., Disp: , Rfl:    torsemide  (DEMADEX ) 20 MG tablet, Take 1 tablet (20 mg total) by mouth 2 (two) times daily. (Patient taking differently:  Take 20 mg by mouth once.), Disp: 60 tablet, Rfl: 2 Past Medical History:  Diagnosis Date   Allergic rhinitis    Anxiety    Arthritis    Asthma    Bladder spasms    Carotid artery disease (HCC)    Collagen vascular disease (HCC)    COPD (chronic obstructive pulmonary disease) (HCC)    Cystocele    Essential hypertension    GERD (gastroesophageal reflux disease)    Hyperlipidemia    Hypothyroidism    Ischemic cardiomyopathy 12/02/2021   Echo 11/27/2021:     Multiple vessel coronary artery disease => CABG (LIMA-dLAD, SVG-Diag, Seq SVG-OM-OM-PDA)    a. s/p CABG in 2006 b. NSTEMI in 11/2021 with DES to LM and LCx and repeat cath later in admission with DES to SVG-D1   NSTEMI (non-ST elevated myocardial infarction) (HCC) 11/22/2021   Osteoporosis    Renal insufficiency    Vaginal vault prolapse    Past Surgical History:  Procedure Laterality Date   ANTERIOR AND POSTERIOR REPAIR N/A 04/06/2013   Procedure: CYSTOCELE REPAIR ;  Surgeon: Glendia DELENA Ramiyah, MD;  Location: [facility];  Service: Urology;  Laterality: N/A;   CATARACTS     REMOVED   COLONOSCOPY     CORNEA LACERATION REPAIR     CORONARY ARTERY BYPASS GRAFT     X 5 VESSELS   CORONARY STENT INTERVENTION N/A 11/23/2021  Procedure: CORONARY STENT INTERVENTION;  Surgeon: Wonda Sharper, MD;  Location: [facility] INVASIVE CV LAB;  Service: Cardiovascular;  Laterality: N/A;   CORONARY/GRAFT ACUTE MI REVASCULARIZATION N/A 11/24/2021   Procedure: Coronary/Graft Acute MI Revascularization;  Surgeon: Court Dorn PARAS, MD;  Location: [facility] INVASIVE CV LAB;  Service: Cardiovascular;  Laterality: N/A;   CYSTOSCOPY N/A 04/06/2013   Procedure: CYSTOSCOPY;  Surgeon: Glendia DELENA Bethanie, MD;  Location: [facility];  Service: Urology;  Laterality: N/A;   HERNIA REPAIR     LEFT HEART CATH AND CORONARY ANGIOGRAPHY N/A 11/24/2021   Procedure: LEFT HEART CATH AND CORONARY ANGIOGRAPHY;  Surgeon: Court Dorn PARAS, MD;  Location: [facility] INVASIVE CV LAB;  Service:  Cardiovascular;  Laterality: N/A;   LEFT HEART CATH AND CORS/GRAFTS ANGIOGRAPHY N/A 11/23/2021   Procedure: LEFT HEART CATH AND CORS/GRAFTS ANGIOGRAPHY;  Surgeon: Wonda Sharper, MD;  Location: [facility] INVASIVE CV LAB;  Service: Cardiovascular;  Laterality: N/A;   SEPTOPLASTY N/A 04/28/2015   Procedure: SEPTOPLASTY;  Surgeon: Merilee Kraft, MD;  Location: [facility] OR;  Service: ENT;  Laterality: N/A;   SINUS ENDO W/FUSION Bilateral 04/28/2015   Procedure: ENDOSCOPIC SINUS SURGERY WITH NAVIGATION;  Surgeon: Merilee Kraft, MD;  Location: [facility] OR;  Service: ENT;  Laterality: Bilateral;   TUBAL LIGATION     Family History  Problem Relation Age of Onset   Cirrhosis Father    Lung disease Father    Diabetes Mellitus II Mother    Heart Problems Mother    Social History   Socioeconomic History   Marital status: Legally Separated    Spouse name: Not on file   Number of children: 5   Years of education: Not on file   Highest education level: Not on file  Occupational History   Occupation: Retired  Tobacco Use   Smoking status: Never   Smokeless tobacco: Never  Vaping Use   Vaping status: Never Used  Substance and Sexual Activity   Alcohol  use: Yes    Alcohol /week: 0.0 standard drinks of alcohol     Comment: 1 drink 1 time a year   Drug use: No   Sexual activity: Not on file  Other Topics Concern   Not on file  Social History Narrative   Not on file   Social Drivers of Health   Financial Resource Strain: Low Risk  (12/12/2022)   Overall Financial Resource Strain (CARDIA)    Difficulty of Paying Living Expenses: Not very hard  Food Insecurity: No Food Insecurity (12/04/2022)   Hunger Vital Sign    Worried About Running Out of Food in the Last Year: Never true    Ran Out of Food in the Last Year: Never true  Transportation Needs: No Transportation Needs (12/12/2022)   PRAPARE - Administrator, Civil Service (Medical): No    Lack of Transportation (Non-Medical): No  Physical Activity:  Inactive (09/17/2022)   Exercise Vital Sign    Days of Exercise per Week: 0 days    Minutes of Exercise per Session: 0 min  Stress: Unknown (12/31/2018)   [employer] of Occupational Health - Occupational Stress Questionnaire    Feeling of Stress : Patient declined  Social Connections: Unknown (12/31/2018)   Social Connection and Isolation Panel    Frequency of Communication with Friends and Family: Patient declined    Frequency of Social Gatherings with Friends and Family: Patient declined    Attends Religious Services: Patient declined    Database administrator or Organizations: Patient declined  Attends Banker Meetings: Patient declined    Marital Status: Patient declined  Intimate Partner Violence: Patient Declined (12/04/2022)   Humiliation, Afraid, Rape, and Kick questionnaire    Fear of Current or Ex-Partner: Patient declined    Emotionally Abused: Patient declined    Physically Abused: Patient declined    Sexually Abused: Patient declined       Objective:  There were no vitals taken for this visit. {Pulm Vitals (Optional):32837}  Physical Exam  Diagnostic Review:  {Labs (Optional):32838} I reviewed CXR on 12/03/2022 which appears to be hyperinflated, previous sternotomy wires identified.  CT Chest 11/2022 reviewed independently and patient has bronchial wall thickening but no evidence of emphysematous changes.  PFTs 2021 reviewed independently and patient has a moderately severe obstructive defect with a bronchodilator response. RV is elevated suggestive of air trapping.    Assessment & Plan:   Assessment & Plan   No orders of the defined types were placed in this encounter.     No follow-ups on file.   Quantrell Splitt, MD

## 2024-01-04 DIAGNOSIS — K219 Gastro-esophageal reflux disease without esophagitis: Secondary | ICD-10-CM | POA: Diagnosis not present

## 2024-01-04 DIAGNOSIS — R069 Unspecified abnormalities of breathing: Secondary | ICD-10-CM | POA: Diagnosis not present

## 2024-01-04 DIAGNOSIS — J441 Chronic obstructive pulmonary disease with (acute) exacerbation: Secondary | ICD-10-CM | POA: Diagnosis not present

## 2024-01-04 DIAGNOSIS — E785 Hyperlipidemia, unspecified: Secondary | ICD-10-CM | POA: Diagnosis not present

## 2024-01-04 DIAGNOSIS — Z91041 Radiographic dye allergy status: Secondary | ICD-10-CM | POA: Diagnosis not present

## 2024-01-04 DIAGNOSIS — Z880 Allergy status to penicillin: Secondary | ICD-10-CM | POA: Diagnosis not present

## 2024-01-04 DIAGNOSIS — R0600 Dyspnea, unspecified: Secondary | ICD-10-CM | POA: Diagnosis not present

## 2024-01-04 DIAGNOSIS — Z885 Allergy status to narcotic agent status: Secondary | ICD-10-CM | POA: Diagnosis not present

## 2024-01-04 DIAGNOSIS — I110 Hypertensive heart disease with heart failure: Secondary | ICD-10-CM | POA: Diagnosis not present

## 2024-01-04 DIAGNOSIS — I509 Heart failure, unspecified: Secondary | ICD-10-CM | POA: Diagnosis not present

## 2024-01-04 DIAGNOSIS — R062 Wheezing: Secondary | ICD-10-CM | POA: Diagnosis not present

## 2024-01-04 DIAGNOSIS — Z20822 Contact with and (suspected) exposure to covid-19: Secondary | ICD-10-CM | POA: Diagnosis not present

## 2024-01-04 DIAGNOSIS — Z882 Allergy status to sulfonamides status: Secondary | ICD-10-CM | POA: Diagnosis not present

## 2024-01-08 DIAGNOSIS — M6281 Muscle weakness (generalized): Secondary | ICD-10-CM | POA: Diagnosis not present

## 2024-01-08 DIAGNOSIS — M25551 Pain in right hip: Secondary | ICD-10-CM | POA: Diagnosis not present

## 2024-01-08 DIAGNOSIS — M25561 Pain in right knee: Secondary | ICD-10-CM | POA: Diagnosis not present

## 2024-01-14 DIAGNOSIS — R051 Acute cough: Secondary | ICD-10-CM | POA: Diagnosis not present

## 2024-01-14 DIAGNOSIS — Z6822 Body mass index (BMI) 22.0-22.9, adult: Secondary | ICD-10-CM | POA: Diagnosis not present

## 2024-01-14 DIAGNOSIS — Z20828 Contact with and (suspected) exposure to other viral communicable diseases: Secondary | ICD-10-CM | POA: Diagnosis not present

## 2024-01-14 DIAGNOSIS — J449 Chronic obstructive pulmonary disease, unspecified: Secondary | ICD-10-CM | POA: Diagnosis not present

## 2024-01-16 DIAGNOSIS — I5020 Unspecified systolic (congestive) heart failure: Secondary | ICD-10-CM | POA: Diagnosis not present

## 2024-01-16 DIAGNOSIS — J449 Chronic obstructive pulmonary disease, unspecified: Secondary | ICD-10-CM | POA: Diagnosis not present

## 2024-01-16 DIAGNOSIS — E782 Mixed hyperlipidemia: Secondary | ICD-10-CM | POA: Diagnosis not present

## 2024-01-16 DIAGNOSIS — E1122 Type 2 diabetes mellitus with diabetic chronic kidney disease: Secondary | ICD-10-CM | POA: Diagnosis not present

## 2024-01-21 DIAGNOSIS — M7061 Trochanteric bursitis, right hip: Secondary | ICD-10-CM | POA: Diagnosis not present

## 2024-02-15 NOTE — Progress Notes (Deleted)
 D  New Patient Pulmonology Office Visit   Subjective:  Patient ID: Debbie [name], female    DOB: 1941-02-26  MRN: [id number]  Referred by: [name], MD  CC: No chief complaint on file.   HPI Debbie [name] is a 83 y.o. female with a PMH significant for CAD s/p CABG, chronic systolic and diastolic heart failure who presents for initial evaluation of suspected COPD.  CTD Symptoms:   PMH:  - Hx of TB ***  - Hx of endemic fungal infections ***  - Hx of Sarcoidosis ***  - Hx of CTD ***  - Hx of lymphoma or other hematological malignancies ***  - Hx of irradiation to the chest or immunotherapy ***   PSH:   Medications:   Allergies:   Social Hx: - Smoking ***  - Second hand smoking ***  - Vaping ***  - Alcohol  ***  - Illicit substances ***  - Indoor emission from household combustion ***  - Hobbies (e.g. wood work, Surveyor, quantity, bird breeding etc...)  - Landscape architect (e.g. mold) ***  - Pets ***  - Birds ***   {Occupational Exposures:33652}  Family Hx:  - Lung disease ***  - CTD ***  - Sarcoidosis ***  - Cancer ***    {PULM QUESTIONNAIRES (Optional):33196}  ROS  Allergies: Ivp dye [iodinated contrast media], Betadine [povidone iodine], Codeine, Other, Penicillins, and Sulfa antibiotics  Current Outpatient Medications:    acetaminophen  (TYLENOL ) 650 MG CR tablet, Take 650 mg by mouth in the morning., Disp: , Rfl:    atorvastatin  (LIPITOR ) 80 MG tablet, Take 1 tablet (80 mg total) by mouth at bedtime., Disp: 30 tablet, Rfl: 3   clopidogrel  (PLAVIX ) 75 MG tablet, Take 1 tablet (75 mg total) by mouth daily., Disp: 30 tablet, Rfl: 0   isosorbide  mononitrate (IMDUR ) 30 MG 24 hr tablet, Take 1 tablet (30 mg total) by mouth daily., Disp: 30 tablet, Rfl: 1   levocetirizine (XYZAL) 5 MG tablet, Take 5 mg by mouth every evening. Equate levocetirizine., Disp: , Rfl:    levothyroxine  (SYNTHROID ) 88 MCG tablet, Take 88 mcg by mouth daily., Disp: , Rfl:     nitroGLYCERIN  (NITROSTAT ) 0.4 MG SL tablet, Place 1 tablet (0.4 mg total) under the tongue every 5 (five) minutes x 3 doses as needed for chest pain (if no relief after 3rd dose, call 911 or proceed to the ED for an evaluation)., Disp: 25 tablet, Rfl: 2   pantoprazole  (PROTONIX ) 40 MG tablet, Take 40 mg by mouth daily., Disp: , Rfl:    potassium chloride  (KLOR-CON  M) 10 MEQ tablet, Take 1 tablet (10 mEq total) by mouth daily. (Patient taking differently: Take 20 mEq by mouth daily.), Disp: 120 tablet, Rfl: 3   tamsulosin  (FLOMAX ) 0.4 MG CAPS capsule, Take 0.4 mg by mouth., Disp: , Rfl:    torsemide  (DEMADEX ) 20 MG tablet, Take 1 tablet (20 mg total) by mouth 2 (two) times daily. (Patient taking differently: Take 20 mg by mouth once.), Disp: 60 tablet, Rfl: 2 Past Medical History:  Diagnosis Date   Allergic rhinitis    Anxiety    Arthritis    Asthma    Bladder spasms    Carotid artery disease    Collagen vascular disease    COPD (chronic obstructive pulmonary disease) (HCC)    Cystocele    Essential hypertension    GERD (gastroesophageal reflux disease)    Hyperlipidemia    Hypothyroidism    Ischemic cardiomyopathy 12/02/2021  Echo 11/27/2021:     Multiple vessel coronary artery disease => CABG (LIMA-dLAD, SVG-Diag, Seq SVG-OM-OM-PDA)    a. s/p CABG in 2006 b. NSTEMI in 11/2021 with DES to LM and LCx and repeat cath later in admission with DES to SVG-D1   NSTEMI (non-ST elevated myocardial infarction) (HCC) 11/22/2021   Osteoporosis    Renal insufficiency    Vaginal vault prolapse    Past Surgical History:  Procedure Laterality Date   ANTERIOR AND POSTERIOR REPAIR N/A 04/06/2013   Procedure: CYSTOCELE REPAIR ;  Surgeon: Glendia DELENA Nolah, MD;  Location: [facility];  Service: Urology;  Laterality: N/A;   CATARACTS     REMOVED   COLONOSCOPY     CORNEA LACERATION REPAIR     CORONARY ARTERY BYPASS GRAFT     X 5 VESSELS   CORONARY STENT INTERVENTION N/A 11/23/2021   Procedure:  CORONARY STENT INTERVENTION;  Surgeon: Wonda Sharper, MD;  Location: [facility] INVASIVE CV LAB;  Service: Cardiovascular;  Laterality: N/A;   CORONARY/GRAFT ACUTE MI REVASCULARIZATION N/A 11/24/2021   Procedure: Coronary/Graft Acute MI Revascularization;  Surgeon: Court Dorn PARAS, MD;  Location: [facility] INVASIVE CV LAB;  Service: Cardiovascular;  Laterality: N/A;   CYSTOSCOPY N/A 04/06/2013   Procedure: CYSTOSCOPY;  Surgeon: Glendia DELENA Rubi, MD;  Location: [facility];  Service: Urology;  Laterality: N/A;   HERNIA REPAIR     LEFT HEART CATH AND CORONARY ANGIOGRAPHY N/A 11/24/2021   Procedure: LEFT HEART CATH AND CORONARY ANGIOGRAPHY;  Surgeon: Court Dorn PARAS, MD;  Location: [facility] CV LAB;  Service: Cardiovascular;  Laterality: N/A;   LEFT HEART CATH AND CORS/GRAFTS ANGIOGRAPHY N/A 11/23/2021   Procedure: LEFT HEART CATH AND CORS/GRAFTS ANGIOGRAPHY;  Surgeon: Wonda Sharper, MD;  Location: [facility] INVASIVE CV LAB;  Service: Cardiovascular;  Laterality: N/A;   SEPTOPLASTY N/A 04/28/2015   Procedure: SEPTOPLASTY;  Surgeon: Merilee Kraft, MD;  Location: [facility] OR;  Service: ENT;  Laterality: N/A;   SINUS ENDO W/FUSION Bilateral 04/28/2015   Procedure: ENDOSCOPIC SINUS SURGERY WITH NAVIGATION;  Surgeon: Merilee Kraft, MD;  Location: [facility] OR;  Service: ENT;  Laterality: Bilateral;   TUBAL LIGATION     Family History  Problem Relation Age of Onset   Cirrhosis Father    Lung disease Father    Diabetes Mellitus II Mother    Heart Problems Mother    Social History   Socioeconomic History   Marital status: Legally Separated    Spouse name: Not on file   Number of children: 5   Years of education: Not on file   Highest education level: Not on file  Occupational History   Occupation: Retired  Tobacco Use   Smoking status: Never   Smokeless tobacco: Never  Vaping Use   Vaping status: Never Used  Substance and Sexual Activity   Alcohol  use: Yes    Alcohol /week: 0.0 standard drinks of alcohol     Comment: 1  drink 1 time a year   Drug use: No   Sexual activity: Not on file  Other Topics Concern   Not on file  Social History Narrative   Not on file   Social Drivers of Health   Financial Resource Strain: Low Risk  (12/12/2022)   Overall Financial Resource Strain (CARDIA)    Difficulty of Paying Living Expenses: Not very hard  Food Insecurity: No Food Insecurity (12/04/2022)   Hunger Vital Sign    Worried About Running Out of Food in the Last Year: Never true    Ran  Out of Food in the Last Year: Never true  Transportation Needs: No Transportation Needs (12/12/2022)   PRAPARE - Administrator, Civil Service (Medical): No    Lack of Transportation (Non-Medical): No  Physical Activity: Inactive (09/17/2022)   Exercise Vital Sign    Days of Exercise per Week: 0 days    Minutes of Exercise per Session: 0 min  Stress: Unknown (12/31/2018)   [employer] of Occupational Health - Occupational Stress Questionnaire    Feeling of Stress : Patient declined  Social Connections: Unknown (12/31/2018)   Social Connection and Isolation Panel    Frequency of Communication with Friends and Family: Patient declined    Frequency of Social Gatherings with Friends and Family: Patient declined    Attends Religious Services: Patient declined    Database administrator or Organizations: Patient declined    Attends Banker Meetings: Patient declined    Marital Status: Patient declined  Intimate Partner Violence: Patient Declined (12/04/2022)   Humiliation, Afraid, Rape, and Kick questionnaire    Fear of Current or Ex-Partner: Patient declined    Emotionally Abused: Patient declined    Physically Abused: Patient declined    Sexually Abused: Patient declined       Objective:  There were no vitals taken for this visit. {Pulm Vitals (Optional):32837}  Physical Exam  Diagnostic Review:  {Labs (Optional):32838}  Bicarb 20s AEC 200 cells/uL CXR 11/2023: hyperinflation CT chest  2024: bronchiectasis in lower lobes, no significant emphysema, hiatal hernia PFT completed 2014: FEV1 1.14 L, 49% predicted, ratio < 70% c/w with moderately severe COPD    Assessment & Plan:   Assessment & Plan   No orders of the defined types were placed in this encounter.     No follow-ups on file.   Sriman Tally, MD

## 2024-02-16 ENCOUNTER — Ambulatory Visit: Admitting: Pulmonary Disease

## 2024-02-16 ENCOUNTER — Encounter: Payer: Self-pay | Admitting: Pulmonary Disease

## 2024-02-17 DIAGNOSIS — E1122 Type 2 diabetes mellitus with diabetic chronic kidney disease: Secondary | ICD-10-CM | POA: Diagnosis not present

## 2024-02-17 DIAGNOSIS — I5020 Unspecified systolic (congestive) heart failure: Secondary | ICD-10-CM | POA: Diagnosis not present

## 2024-02-17 DIAGNOSIS — E782 Mixed hyperlipidemia: Secondary | ICD-10-CM | POA: Diagnosis not present

## 2024-02-17 DIAGNOSIS — J449 Chronic obstructive pulmonary disease, unspecified: Secondary | ICD-10-CM | POA: Diagnosis not present

## 2024-02-25 DIAGNOSIS — J449 Chronic obstructive pulmonary disease, unspecified: Secondary | ICD-10-CM | POA: Diagnosis not present

## 2024-02-25 DIAGNOSIS — Z23 Encounter for immunization: Secondary | ICD-10-CM | POA: Diagnosis not present

## 2024-02-25 DIAGNOSIS — Z6821 Body mass index (BMI) 21.0-21.9, adult: Secondary | ICD-10-CM | POA: Diagnosis not present

## 2024-03-10 ENCOUNTER — Telehealth: Payer: Self-pay

## 2024-03-10 DIAGNOSIS — J439 Emphysema, unspecified: Secondary | ICD-10-CM

## 2024-03-11 ENCOUNTER — Telehealth: Payer: Self-pay

## 2024-03-11 NOTE — Progress Notes (Signed)
 Complex Care Management Note  Care Guide Note 03/11/2024 Name: Debbie [name] MRN: [id number] DOB: [birth year]  Debbie [name] is a 83 y.o. year old female who sees [name], MD for primary care. I reached out to Almarie JAYSON Register by phone today to offer complex care management services.  [name] was given information about Complex Care Management services today including:   The Complex Care Management services include support from the care team which includes your Nurse Care Manager, Clinical Social Worker, or Pharmacist.  The Complex Care Management team is here to help remove barriers to the health concerns and goals most important to you. Complex Care Management services are voluntary, and the patient may decline or stop services at any time by request to their care team member.   Complex Care Management Consent Status: Patient agreed to services and verbal consent obtained.   Follow up plan:  Telephone appointment with complex care management team member scheduled for:  04/01/24 @ 11 AM  Encounter Outcome:  Patient Scheduled  [name] [facility], [facility] Guide  Direct Dial: [phone]  Fax [phone]

## 2024-03-19 DIAGNOSIS — E782 Mixed hyperlipidemia: Secondary | ICD-10-CM | POA: Diagnosis not present

## 2024-03-19 DIAGNOSIS — E1122 Type 2 diabetes mellitus with diabetic chronic kidney disease: Secondary | ICD-10-CM | POA: Diagnosis not present

## 2024-03-19 DIAGNOSIS — I5020 Unspecified systolic (congestive) heart failure: Secondary | ICD-10-CM | POA: Diagnosis not present

## 2024-03-19 DIAGNOSIS — J449 Chronic obstructive pulmonary disease, unspecified: Secondary | ICD-10-CM | POA: Diagnosis not present

## 2024-03-30 DIAGNOSIS — D631 Anemia in chronic kidney disease: Secondary | ICD-10-CM | POA: Diagnosis not present

## 2024-03-30 DIAGNOSIS — I25119 Atherosclerotic heart disease of native coronary artery with unspecified angina pectoris: Secondary | ICD-10-CM | POA: Diagnosis not present

## 2024-03-30 DIAGNOSIS — Z87440 Personal history of urinary (tract) infections: Secondary | ICD-10-CM | POA: Diagnosis not present

## 2024-03-30 DIAGNOSIS — J439 Emphysema, unspecified: Secondary | ICD-10-CM | POA: Diagnosis not present

## 2024-03-30 DIAGNOSIS — M810 Age-related osteoporosis without current pathological fracture: Secondary | ICD-10-CM | POA: Diagnosis not present

## 2024-03-30 DIAGNOSIS — I700 Atherosclerosis of aorta: Secondary | ICD-10-CM | POA: Diagnosis not present

## 2024-03-30 DIAGNOSIS — Z79890 Hormone replacement therapy (postmenopausal): Secondary | ICD-10-CM | POA: Diagnosis not present

## 2024-03-30 DIAGNOSIS — E039 Hypothyroidism, unspecified: Secondary | ICD-10-CM | POA: Diagnosis not present

## 2024-03-30 DIAGNOSIS — Z809 Family history of malignant neoplasm, unspecified: Secondary | ICD-10-CM | POA: Diagnosis not present

## 2024-03-30 DIAGNOSIS — E876 Hypokalemia: Secondary | ICD-10-CM | POA: Diagnosis not present

## 2024-03-30 DIAGNOSIS — I739 Peripheral vascular disease, unspecified: Secondary | ICD-10-CM | POA: Diagnosis not present

## 2024-03-30 DIAGNOSIS — I255 Ischemic cardiomyopathy: Secondary | ICD-10-CM | POA: Diagnosis not present

## 2024-03-30 DIAGNOSIS — Z91041 Radiographic dye allergy status: Secondary | ICD-10-CM | POA: Diagnosis not present

## 2024-03-30 DIAGNOSIS — F324 Major depressive disorder, single episode, in partial remission: Secondary | ICD-10-CM | POA: Diagnosis not present

## 2024-03-30 DIAGNOSIS — Z833 Family history of diabetes mellitus: Secondary | ICD-10-CM | POA: Diagnosis not present

## 2024-03-30 DIAGNOSIS — I130 Hypertensive heart and chronic kidney disease with heart failure and stage 1 through stage 4 chronic kidney disease, or unspecified chronic kidney disease: Secondary | ICD-10-CM | POA: Diagnosis not present

## 2024-03-30 DIAGNOSIS — E785 Hyperlipidemia, unspecified: Secondary | ICD-10-CM | POA: Diagnosis not present

## 2024-03-30 DIAGNOSIS — I252 Old myocardial infarction: Secondary | ICD-10-CM | POA: Diagnosis not present

## 2024-03-30 DIAGNOSIS — N1831 Chronic kidney disease, stage 3a: Secondary | ICD-10-CM | POA: Diagnosis not present

## 2024-03-30 DIAGNOSIS — Z9989 Dependence on other enabling machines and devices: Secondary | ICD-10-CM | POA: Diagnosis not present

## 2024-03-30 DIAGNOSIS — M1990 Unspecified osteoarthritis, unspecified site: Secondary | ICD-10-CM | POA: Diagnosis not present

## 2024-03-30 DIAGNOSIS — I509 Heart failure, unspecified: Secondary | ICD-10-CM | POA: Diagnosis not present

## 2024-03-30 DIAGNOSIS — F419 Anxiety disorder, unspecified: Secondary | ICD-10-CM | POA: Diagnosis not present

## 2024-03-30 DIAGNOSIS — Z791 Long term (current) use of non-steroidal anti-inflammatories (NSAID): Secondary | ICD-10-CM | POA: Diagnosis not present

## 2024-03-30 DIAGNOSIS — G3184 Mild cognitive impairment, so stated: Secondary | ICD-10-CM | POA: Diagnosis not present

## 2024-04-01 ENCOUNTER — Other Ambulatory Visit: Payer: Self-pay | Admitting: Licensed Clinical Social Worker

## 2024-04-01 NOTE — Patient Outreach (Signed)
 Complex Care Management   Visit Note  04/01/2024  Name:  Debbie [name] MRN: [id number] DOB: [birth year]  Situation: Referral received for Complex Care Management related to {Criteria:32550} I obtained verbal consent from {CHL AMB Patient/Caregiver:28184}.  Visit completed with {CHL AMB Patient/Caregiver:28184}  {VISIT LOCATION:32553}  Background:   Past Medical History:  Diagnosis Date   Allergic rhinitis    Anxiety    Arthritis    Asthma    Bladder spasms    Carotid artery disease    Collagen vascular disease    COPD (chronic obstructive pulmonary disease) (HCC)    Cystocele    Essential hypertension    GERD (gastroesophageal reflux disease)    Hyperlipidemia    Hypothyroidism    Ischemic cardiomyopathy 12/02/2021   Echo 11/27/2021:     Multiple vessel coronary artery disease => CABG (LIMA-dLAD, SVG-Diag, Seq SVG-OM-OM-PDA)    a. s/p CABG in 2006 b. NSTEMI in 11/2021 with DES to LM and LCx and repeat cath later in admission with DES to SVG-D1   NSTEMI (non-ST elevated myocardial infarction) (HCC) 11/22/2021   Osteoporosis    Renal insufficiency    Vaginal vault prolapse     Assessment: Patient Reported Symptoms:  Cognitive Cognitive Status: Able to follow simple commands, Alert and oriented to person, place, and time, Struggling with memory recall Cognitive/Intellectual Conditions Management [RPT]: None reported or documented in medical history or problem list   Health Maintenance Behaviors: Annual physical exam Healing Pattern: Slow Health Facilitated by: Rest, Stress management  Neurological      HEENT        Cardiovascular      Respiratory      Endocrine      Gastrointestinal        Genitourinary      Integumentary      Musculoskeletal          Psychosocial       Quality of Family Relationships: helpful, involved Do you feel physically threatened by others?: No    04/01/2024    PHQ2-9 Depression Screening   Little interest or pleasure  in doing things    Feeling down, depressed, or hopeless    PHQ-2 - Total Score    Trouble falling or staying asleep, or sleeping too much    Feeling tired or having little energy    Poor appetite or overeating     Feeling bad about yourself - or that you are a failure or have let yourself or your family down    Trouble concentrating on things, such as reading the newspaper or watching television    Moving or speaking so slowly that other people could have noticed.  Or the opposite - being so fidgety or restless that you have been moving around a lot more than usual    Thoughts that you would be better off dead, or hurting yourself in some way    PHQ2-9 Total Score    If you checked off any problems, how difficult have these problems made it for you to do your work, take care of things at home, or get along with other people    Depression Interventions/Treatment      There were no vitals filed for this visit.    Medications Reviewed Today     Reviewed by Merlynn Lyle CROME, LCSW (Social Worker) on 04/01/24 at 1546  Med List Status: <None>   Medication Order Taking? Sig Documenting Provider Last Dose Status Informant  acetaminophen  (TYLENOL ) 650 MG CR  tablet [id number] No Take 650 mg by mouth in the morning. [provider] Taking Active Self, Pharmacy Records  atorvastatin  (LIPITOR ) 80 MG tablet [id number] No Take 1 tablet (80 mg total) by mouth at bedtime. Pearlean Manus, MD Taking Active Self, Pharmacy Records  clopidogrel  (PLAVIX ) 75 MG tablet [id number] No Take 1 tablet (75 mg total) by mouth daily. Debera Jayson MATSU, MD Taking Active Self, Pharmacy Records  isosorbide  mononitrate (IMDUR ) 30 MG 24 hr tablet [id number] No Take 1 tablet (30 mg total) by mouth daily. Willette Adriana LABOR, MD Taking Active   levocetirizine (XYZAL) 5 MG tablet [id number] No Take 5 mg by mouth every evening. Equate levocetirizine. [provider] Taking Active Self, Pharmacy Records  levothyroxine   (SYNTHROID ) 88 MCG tablet [id number] No Take 88 mcg by mouth daily. [provider] Taking Active Self, Pharmacy Records  nitroGLYCERIN  (NITROSTAT ) 0.4 MG SL tablet [id number] No Place 1 tablet (0.4 mg total) under the tongue every 5 (five) minutes x 3 doses as needed for chest pain (if no relief after 3rd dose, call 911 or proceed to the ED for an evaluation). Richarda Prentice LITTIE Mickey., NP Taking Active Self, Pharmacy Records           Med Note [facility], JON HERO   Fri Nov 23, 2021 12:14 PM) Pt states she keeps in her pocket book. No fill history on dispense report/Dr. First - may be expired.  pantoprazole  (PROTONIX ) 40 MG tablet [id number] No Take 40 mg by mouth daily. [provider] Taking Active Self, Pharmacy Records  potassium chloride  (KLOR-CON  M) 10 MEQ tablet [id number] No Take 1 tablet (10 mEq total) by mouth daily.  Patient taking differently: Take 20 mEq by mouth daily.   Vicci Afton LITTIE, MD Taking Active Self, Pharmacy Records  tamsulosin  (FLOMAX ) 0.4 MG CAPS capsule [id number] No Take 0.4 mg by mouth. [provider] Taking Active Self, Pharmacy Records  torsemide  (DEMADEX ) 20 MG tablet [id number] No Take 1 tablet (20 mg total) by mouth 2 (two) times daily.  Patient taking differently: Take 20 mg by mouth once.   Willette Adriana LABOR, MD Taking Expired 02/03/23 2359 Self            Recommendation:   {RECOMMENDATONS:32554}  Follow Up Plan:   {FOLLOWUP:32559}  SIG ***

## 2024-04-02 NOTE — Patient Instructions (Signed)
 Visit Information  Thank you for taking time to visit with me today. Please don't hesitate to contact me if I can be of assistance to you before our next scheduled appointment.  Our next appointment is by telephone on 04/22/24 at 945 Please call the care guide team at [phone] if you need to cancel or reschedule your appointment.   Following is a copy of your care plan:   Goals Addressed             This Visit's Progress    LCSW VBCI Social Work Care Plan       Problems:   Cognitive Deficits and Disease Management support and education needs related to Anxiety with memory loss and Grief  CSW Clinical Goal(s):   Over the next 90 days the Patient will attend all scheduled medical appointments as evidenced by patient report and care team review of appointment completion in electronic MEDICAL RECORD NUMBERby VBCI LCSW demonstrate a reduction in symptoms related to Grief  .  Interventions:  Dementia Care:   Current level of care: home, alone Evaluation of patient safety in current living environment and review of Dementia resources and support (support network includes friend, daughter and son) ADL's Assessed needs, level of care concerns, how currently meeting needs and barriers to care Discussed family support and building support system : Friend comes by often to support patient's care giving needs Discussed private pay options for personal care needs (Patient does not wish to consider this out of pocket expense resource at this time and does not have Medicaid for [facility]) Active listening / Reflection utilized Behavioral Activation reviewed Consideration on in-home help encouraged : options discussed Financial Risk Analyst / information provided Discussed caregiver resources and support: Pt was encouraged to increase family support Emotional Support Provided Motivational Interviewing employed PHQ2/PHQ9 completed Problem Solving /Task Center strategies reviewed Quality of sleep  assessed & Sleep Hygiene techniques promoted Solution-Focued Strategies employed: Suicidal Ideation/Homicidal Ideation assessed: No SI/HI  Patient Goals/Self-Care Activities:  Increase coping skills, healthy habits, self-management skills, and stress reduction  Plan:   The care management team will reach out to the patient again over the next 30 days.        Please call the Suicide and Crisis Lifeline: 988 call the [place]  National Suicide Prevention Lifeline: 971-880-7381 or TTY: (458) 266-2057 TTY 860-333-0095) to talk to a trained counselor call 1-800-273-TALK (toll free, 24 hour hotline) go to [facility] [address], [place] [phone]) call the [facility] Crisis Line: [phone] call 911 if you are experiencing a Mental Health or Behavioral Health Crisis or need someone to talk to.  Patient verbalized understanding of Care plan and visit instructions communicated this visit  [name], BSW, MSW, LCSW Licensed Clinical Social Worker [employer]   [facility] [email or web address] Direct Dial: [phone]

## 2024-04-07 DIAGNOSIS — Z20828 Contact with and (suspected) exposure to other viral communicable diseases: Secondary | ICD-10-CM | POA: Diagnosis not present

## 2024-04-07 DIAGNOSIS — R051 Acute cough: Secondary | ICD-10-CM | POA: Diagnosis not present

## 2024-04-07 DIAGNOSIS — Z6821 Body mass index (BMI) 21.0-21.9, adult: Secondary | ICD-10-CM | POA: Diagnosis not present

## 2024-04-07 DIAGNOSIS — J0100 Acute maxillary sinusitis, unspecified: Secondary | ICD-10-CM | POA: Diagnosis not present

## 2024-04-16 DIAGNOSIS — I5020 Unspecified systolic (congestive) heart failure: Secondary | ICD-10-CM | POA: Diagnosis not present

## 2024-04-16 DIAGNOSIS — E1122 Type 2 diabetes mellitus with diabetic chronic kidney disease: Secondary | ICD-10-CM | POA: Diagnosis not present

## 2024-04-16 DIAGNOSIS — J449 Chronic obstructive pulmonary disease, unspecified: Secondary | ICD-10-CM | POA: Diagnosis not present

## 2024-04-16 DIAGNOSIS — E782 Mixed hyperlipidemia: Secondary | ICD-10-CM | POA: Diagnosis not present

## 2024-04-22 ENCOUNTER — Telehealth: Payer: Self-pay | Admitting: Licensed Clinical Social Worker

## 2024-04-22 ENCOUNTER — Encounter: Payer: Self-pay | Admitting: Licensed Clinical Social Worker

## 2024-04-22 NOTE — Patient Instructions (Signed)
 Debbie [name] - I am sorry I was unable to reach you today for our scheduled appointment. I work with [name], MD and am calling to support your healthcare needs. Please contact me at [phone] at your earliest convenience. I look forward to speaking with you soon.   Thank you,  [name], BSW, MSW, LCSW Licensed Clinical Social Worker [employer]   [facility] [email or web address] Direct Dial: [phone]

## 2024-04-27 ENCOUNTER — Ambulatory Visit: Payer: Self-pay | Admitting: Internal Medicine

## 2024-04-27 NOTE — Telephone Encounter (Addendum)
 FYI Only or Action Required?: FYI only for provider: ED advised and refused.  Patient is followed in Pulmonology for: re-establishing care .  Called Nurse Triage reporting Shortness of Breath.  Symptoms began several weeks ago.  Interventions attempted: Nebulizer treatments and Other: lasix .  Symptoms are: gradually worsening.  Triage Disposition: See HCP Within 4 Hours (Or PCP Triage)  Patient/caregiver understands and will follow disposition?: Unsure  Copied from CRM [phone]. Topic: Clinical - Red Word Triage >> Apr 27, 2024  3:00 PM Devaughn RAMAN wrote: Red Word that prompted transfer to Nurse Triage: Difficulty breathing Reason for Disposition  [1] Longstanding difficulty breathing (e.g., CHF, COPD, emphysema) AND [2] WORSE than normal  Answer Assessment - Initial Assessment Questions Attempted to schedule appt with pt for sooner appt with Dr. Victory but pt states she does not drive and will need to call her son. States he is very busy, discussed if patient cannot come tomorrow, this RN would recommend ED d/t breathing and pt reporting increased fluid in legs despite taking lasix . Pt to call back after she speaks to her son. Unable to complete triage scheduling.     1. RESPIRATORY STATUS: Describe your breathing? (e.g., wheezing, shortness of breath, unable to speak, severe coughing)      SOB; wheezing   2. ONSET: When did this breathing problem begin?      Chronic COPD; appt with Dr. Darlean to establish care but not until Jan 2026  3. PATTERN Does the difficult breathing come and go, or has it been constant since it started?      Comes and goes; worse with cooler weather   4. SEVERITY: How bad is your breathing? (e.g., mild, moderate, severe)      Mod-severe   5. RECURRENT SYMPTOM: Have you had difficulty breathing before? If Yes, ask: When was the last time? and What happened that time?      Yes; COPD   6. CARDIAC HISTORY: Do you have any history of heart  disease? (e.g., heart attack, angina, bypass surgery, angioplasty)      N/a  7. LUNG HISTORY: Do you have any history of lung disease?  (e.g., pulmonary embolus, asthma, emphysema)     COPD   8. CAUSE: What do you think is causing the breathing problem?      COPD and oxygen  demands; pt reports using face mask   9. OTHER SYMPTOMS: Do you have any other symptoms? (e.g., chest pain, cough, dizziness, fever, runny nose)     Cough  Protocols used: Breathing Difficulty-A-AH

## 2024-04-27 NOTE — Telephone Encounter (Signed)
 Patient calling back , patient sounds really winded and taking breaths between words, advised ED. Patient cannot afford to go to ED and is refusing. States she is using her inhaler 3-4 times daily. Patient not yet established with Dr. Cristie, new patient appointment on 1/14, advised patient to call her PCP in the mean time or go to ED for symptoms. She states she is seeing PCP tomorrow.

## 2024-04-29 ENCOUNTER — Other Ambulatory Visit: Payer: Self-pay | Admitting: Licensed Clinical Social Worker

## 2024-04-29 NOTE — Patient Outreach (Signed)
 Complex Care Management   Visit Note  04/29/2024  Name:  Debbie [name] MRN: [id number] DOB: May 06, 1941  Situation: Referral received for Complex Care Management related to Mental/Behavioral Health diagnosis anxiety. I obtained verbal consent from Patient.  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 s/p CABG in 2006 b. NSTEMI in 11/2021 with DES to LM and LCx and repeat cath later in admission with DES to SVG-D1   NSTEMI (non-ST elevated myocardial infarction) (HCC) 11/22/2021   Osteoporosis    Renal insufficiency    Vaginal vault prolapse     Assessment: Patient Reported Symptoms:  Cognitive Cognitive Status: Struggling with memory recall, Able to follow simple commands Cognitive/Intellectual Conditions Management [RPT]: None reported or documented in medical history or problem list   Health Maintenance Behaviors: Annual physical exam Healing Pattern: Slow Health Facilitated by: Rest, Stress management  Neurological Neurological Review of Symptoms: Weakness Neurological Management Strategies: Coping strategies, Adequate rest, Routine screening Neurological Self-Management Outcome: 3 (uncertain)  HEENT HEENT Symptoms Reported: No symptoms reported HEENT Management Strategies: Routine screening    Cardiovascular Cardiovascular Symptoms Reported: No symptoms reported Does patient have uncontrolled Hypertension?: Yes Is patient checking Blood Pressure at home?:  No Cardiovascular Management Strategies: Coping strategies, Routine screening, Medication therapy  Respiratory Respiratory Symptoms Reported: Shortness of breath Other Respiratory Symptoms: pulmonologist contact number provided for pt to call triage nurse, pt denied needing to go to urgent care Respiratory Management Strategies: Breathing exercise, Routine screening, Breathing techniques Respiratory Self-Management Outcome: 2 (bad)  Endocrine Endocrine Symptoms Reported: No symptoms reported    Gastrointestinal Gastrointestinal Symptoms Reported: No symptoms reported      Genitourinary Genitourinary Symptoms Reported: No symptoms reported    Integumentary Integumentary Symptoms Reported: No symptoms reported    Musculoskeletal Musculoskelatal Symptoms Reviewed: Unsteady gait, Weakness Musculoskeletal Management Strategies: Routine screening Musculoskeletal Self-Management Outcome: 2 (bad)      Psychosocial Psychosocial Symptoms Reported: Other Other Psychosocial Conditions: Memory loss Behavioral Management Strategies: Coping strategies, Adequate rest Behavioral Health Self-Management Outcome: 3 (uncertain) Major Change/Loss/Stressor/Fears (CP): Medical condition, self Techniques to Cope with Loss/Stress/Change: Diversional activities Quality of Family Relationships: helpful Do you feel physically threatened by others?: No    04/29/2024    PHQ2-9 Depression Screening   Little interest or pleasure in doing things Not at all  Feeling down, depressed, or hopeless Several days  PHQ-2 - Total Score 1  Trouble falling or staying asleep, or sleeping too much    Feeling tired or having little energy    Poor appetite or overeating     Feeling bad about yourself - or that you are a failure or have let yourself or your family down    Trouble concentrating on things, such as reading the newspaper or watching television    Moving or speaking so slowly that other people could have noticed.   Or the opposite - being so fidgety  or restless that you have been moving around a lot more than usual    Thoughts that you would be better off dead, or hurting yourself in some way    PHQ2-9 Total Score    If you checked off any problems, how difficult have these problems made it for you to do your work, take care of things at home, or get along with other people    Depression Interventions/Treatment      There were no vitals filed for this visit.    Medications Reviewed Today     Reviewed by Merlynn Lyle CROME, LCSW (Social Worker) on 04/29/24 at 1430  Med List Status: <None>   Medication Order Taking? Sig Documenting Provider Last Dose Status Informant  acetaminophen  (TYLENOL ) 650 MG CR tablet [id number] No Take 650 mg by mouth in the morning. [provider] Taking Active Self, Pharmacy Records  atorvastatin  (LIPITOR ) 80 MG tablet [id number] No Take 1 tablet (80 mg total) by mouth at bedtime. Pearlean Manus, MD Taking Active Self, Pharmacy Records  clopidogrel  (PLAVIX ) 75 MG tablet [id number] No Take 1 tablet (75 mg total) by mouth daily. Debera Jayson MATSU, MD Taking Active Self, Pharmacy Records  isosorbide  mononitrate (IMDUR ) 30 MG 24 hr tablet [id number] No Take 1 tablet (30 mg total) by mouth daily. Willette Adriana LABOR, MD Taking Active   levocetirizine (XYZAL) 5 MG tablet [id number] No Take 5 mg by mouth every evening. Equate levocetirizine. [provider] Taking Active Self, Pharmacy Records  levothyroxine  (SYNTHROID ) 88 MCG tablet [id number] No Take 88 mcg by mouth daily. [provider] Taking Active Self, Pharmacy Records  nitroGLYCERIN  (NITROSTAT ) 0.4 MG SL tablet [id number] No Place 1 tablet (0.4 mg total) under the tongue every 5 (five) minutes x 3 doses as needed for chest pain (if no relief after 3rd dose, call 911 or proceed to the ED for an evaluation). Richarda Prentice CROME Mickey., NP Taking Active Self, Pharmacy Records           Med Note [facility], JON HERO    Fri Nov 23, 2021 12:14 PM) Pt states she keeps in her pocket book. No fill history on dispense report/Dr. First - may be expired.  pantoprazole  (PROTONIX ) 40 MG tablet [id number] No Take 40 mg by mouth daily. [provider] Taking Active Self, Pharmacy Records  potassium chloride  (KLOR-CON  M) 10 MEQ tablet [id number] No Take 1 tablet (10 mEq total) by mouth daily.  Patient taking differently: Take 20 mEq by mouth daily.   Vicci Afton CROME, MD Taking Active Self, Pharmacy Records  tamsulosin  (FLOMAX ) 0.4 MG CAPS capsule [id number] No Take 0.4 mg by mouth. [provider] Taking Active Self, Pharmacy Records  torsemide  (DEMADEX ) 20 MG tablet [id number] No Take 1 tablet (20 mg total) by mouth 2 (two) times daily.  Patient taking differently: Take 20 mg by mouth once.   Willette Adriana LABOR, MD Taking Expired 02/03/23 2359 Self            Recommendation:   PCP Follow-up Specialty provider follow-up Pulm Continue Current Plan of Care  Follow Up Plan:   Telephone follow-up in 1 month  Lyle Merlynn, BSW, MSW, LCSW Licensed Clinical Social Worker [employer]   [facility] [email or web address] Direct Dial: [phone]

## 2024-04-29 NOTE — Patient Instructions (Signed)
 Visit Information  Thank you for taking time to visit with me today. Please don't hesitate to contact me if I can be of assistance to you before our next scheduled appointment.  Our next appointment is by telephone on 05/22/23 at 11 am Please call the care guide team at [phone] if you need to cancel or reschedule your appointment.   Following is a copy of your care plan:   Goals Addressed             This Visit's Progress    LCSW VBCI Social Work Care Plan       Problems:   Cognitive Deficits and Disease Management support and education needs related to Anxiety with memory loss and Grief  CSW Clinical Goal(s):   Over the next 90 days the Patient will attend all scheduled medical appointments as evidenced by patient report and care team review of appointment completion in electronic MEDICAL RECORD NUMBERby VBCI LCSW demonstrate a reduction in symptoms related to Grief  .  Interventions:  Dementia Care:   Current level of care: home, alone Evaluation of patient safety in current living environment and review of Dementia resources and support (support network includes friend, daughter and son) ADL's Assessed needs, level of care concerns, how currently meeting needs and barriers to care Discussed family support and building support system : Friend comes by often to support patient's care giving needs Discussed private pay options for personal care needs (Patient does not wish to consider this out of pocket expense resource at this time and does not have Medicaid for [facility]) Active listening / Reflection utilized Behavioral Activation reviewed Consideration on in-home help encouraged : options discussed Financial Risk Analyst / information provided Discussed caregiver resources and support: Pt was encouraged to increase family support Emotional Support Provided Motivational Interviewing employed PHQ2/PHQ9 completed Problem Solving /Task Center strategies reviewed Quality of  sleep assessed & Sleep Hygiene techniques promoted Solution-Focued Strategies employed: Suicidal Ideation/Homicidal Ideation assessed: No SI/HI Patient reports SOB during 04/29/24 visit and was advised to contact her pulmonologist nurse triage line for direction. Number provided.  Patient agreeable to [facility] referral which was placed today  Patient Goals/Self-Care Activities:  Increase coping skills, healthy habits, self-management skills, and stress reduction  Plan:   The care management team will reach out to the patient again over the next 30 days.        Please call the Suicide and Crisis Lifeline: 988 call the [place]  National Suicide Prevention Lifeline: (662) 343-1754 or TTY: (303)820-2265 TTY 662 538 5462) to talk to a trained counselor call 1-800-273-TALK (toll free, 24 hour hotline) go to [facility] [address], [place] [phone]) call the [facility] Crisis Line: 626-237-4629 call 911 if you are experiencing a Mental Health or Behavioral Health Crisis or need someone to talk to.  Patient verbalized understanding of Care plan and visit instructions communicated this visit  [name], BSW, MSW, LCSW Licensed Clinical Social Worker [employer]   [email or web address] [email or web address] Direct Dial: [phone]

## 2024-05-10 ENCOUNTER — Other Ambulatory Visit: Payer: Self-pay

## 2024-05-10 ENCOUNTER — Encounter: Payer: Self-pay | Admitting: *Deleted

## 2024-05-10 ENCOUNTER — Other Ambulatory Visit: Payer: Self-pay | Admitting: *Deleted

## 2024-05-10 NOTE — Patient Outreach (Signed)
 Complex Care Management   Visit Note  05/10/2024  Name:  Debbie [name] MRN: [id number] DOB: 1940/11/27  Situation: Referral received for Complex Care Management related to Heart Failure and COPD I obtained verbal consent from Patient.  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 s/p CABG in 2006 b. NSTEMI in 11/2021 with DES to LM and LCx and repeat cath later in admission with DES to SVG-D1   NSTEMI (non-ST elevated myocardial infarction) (HCC) 11/22/2021   Osteoporosis    Renal insufficiency    Vaginal vault prolapse     Assessment: Patient Reported Symptoms:  Cognitive Cognitive Status: Able to follow simple commands, Struggling with memory recall Cognitive/Intellectual Conditions Management [RPT]: None reported or documented in medical history or problem list   Health Maintenance Behaviors: Annual physical exam, Sleep adequate Healing Pattern: Slow Health Facilitated by: Stress management, Rest  Neurological      HEENT        Cardiovascular Cardiovascular Symptoms Reported: Swelling in legs or feet Does patient have uncontrolled Hypertension?: Yes Is patient checking Blood Pressure at home?: Yes Patient's Recent BP reading at home: unavailable at time of call Cardiovascular Management Strategies: Medication therapy, Activity, Fluid modification Cardiovascular Comment: bilateral lower extremity edema. Takes furosemide  40mg  BID as prescribed. Urinating  appropriately. Swelling goes down overnight. Patient does prop her feet up when seated but staes that she stays up moving around most of the day.  Respiratory Respiratory Symptoms Reported: Shortness of breath Additional Respiratory Details: SOB with exertion. Recommended pulmonary rehab. Patient has not had this before. RN Care Manager to communicate this to Dr Darlean. Scheduled for new patient appointment on 06/02/24. Verified that patient is able to drive herself to this appointment since it is in [place]. Respiratory Management Strategies: Breathing techniques, Breathing exercise, Routine screening, Medication therapy Respiratory Self-Management Outcome: 2 (bad)  Endocrine Endocrine Symptoms Reported: No symptoms reported Is patient diabetic?: No    Gastrointestinal Gastrointestinal Symptoms Reported: No symptoms reported      Genitourinary Genitourinary Symptoms Reported: No symptoms reported    Integumentary Integumentary Symptoms Reported: No symptoms reported    Musculoskeletal Musculoskelatal Symptoms Reviewed: Joint pain, Weakness, Unsteady gait Additional Musculoskeletal Details: chronic right knee pain. Takes ibuprofen  as needed with good relief. Musculoskeletal Management Strategies: Routine screening Musculoskeletal Self-Management Outcome: 2 (bad) Musculoskeletal Comment: uses cane for ambulation to help with balance Falls in the past year?: No Number of falls in past year: 1 or less Was there an injury with Fall?: No Fall Risk Category Calculator: 0 Patient Fall Risk Level: Low Fall Risk Patient at Risk for Falls Due to: Impaired balance/gait Fall risk Follow up: Falls evaluation completed, Falls prevention discussed  Psychosocial Psychosocial Symptoms Reported: No symptoms reported     Quality of Family Relationships: helpful, supportive, involved Do you feel physically threatened by others?: No    05/10/2024    PHQ2-9 Depression  Screening   Little interest or  pleasure in doing things    Feeling down, depressed, or hopeless    PHQ-2 - Total Score    Trouble falling or staying asleep, or sleeping too much    Feeling tired or having little energy    Poor appetite or overeating     Feeling bad about yourself - or that you are a failure or have let yourself or your family down    Trouble concentrating on things, such as reading the newspaper or watching television    Moving or speaking so slowly that other people could have noticed.  Or the opposite - being so fidgety or restless that you have been moving around a lot more than usual    Thoughts that you would be better off dead, or hurting yourself in some way    PHQ2-9 Total Score    If you checked off any problems, how difficult have these problems made it for you to do your work, take care of things at home, or get along with other people    Depression Interventions/Treatment      There were no vitals filed for this visit.    Medications Reviewed Today     Reviewed by Charlsie Josette SAILOR, RN (Registered Nurse) on 05/10/24 at 1141  Med List Status: <None>   Medication Order Taking? Sig Documenting Provider Last Dose Status Informant  acetaminophen  (TYLENOL ) 650 MG CR tablet [id number] Yes Take 650 mg by mouth in the morning. [provider]  Active Self, Pharmacy Records  atorvastatin  (LIPITOR ) 80 MG tablet [id number] Yes Take 1 tablet (80 mg total) by mouth at bedtime. Pearlean Manus, MD  Active Self, Pharmacy Records  bisoprolol  (ZEBETA ) 5 MG tablet [id number] Yes Take 2.5 mg by mouth every morning. [provider]  Active   clopidogrel  (PLAVIX ) 75 MG tablet [id number] Yes Take 1 tablet (75 mg total) by mouth daily. Debera Jayson MATSU, MD  Active Self, Pharmacy Records  furosemide  (LASIX ) 40 MG tablet [id number] Yes Take 80 mg by mouth 2 (two) times daily. [provider]  Active   isosorbide  mononitrate (IMDUR ) 30 MG 24 hr tablet [id number] Yes Take 1 tablet (30 mg total)  by mouth daily. Willette Adriana LABOR, MD  Active   levocetirizine (XYZAL) 5 MG tablet [id number] Yes Take 5 mg by mouth every evening. Equate levocetirizine. [provider]  Active Self, Pharmacy Records  levothyroxine  (SYNTHROID ) 88 MCG tablet [id number] Yes Take 88 mcg by mouth daily. [provider]  Active Self, Pharmacy Records  nitroGLYCERIN  (NITROSTAT ) 0.4 MG SL tablet [id number] Yes Place 1 tablet (0.4 mg total) under the tongue every 5 (five) minutes x 3 doses as needed for chest pain (if no relief after 3rd dose, call 911 or proceed to the ED for an evaluation). Richarda Prentice LITTIE Mickey., NP  Active Self, Pharmacy Records           Med Note [facility], JON HERO   Fri Nov 23, 2021 12:14 PM) Pt states she keeps in her pocket book. No fill history on dispense report/Dr. First - may be expired.  pantoprazole  (PROTONIX ) 40 MG tablet [id number] Yes Take 40 mg by mouth daily. [provider]  Active Self, Pharmacy Records  potassium chloride  (KLOR-CON  M) 10 MEQ tablet [id number] Yes Take 1 tablet (10 mEq total) by mouth daily.  Patient taking differently: Take 20 mEq by mouth daily.   Vicci Afton LITTIE, MD  Active Self, Pharmacy Records  tamsulosin  (FLOMAX )  0.4 MG CAPS capsule [id number] Yes Take 0.4 mg by mouth. [provider]  Active Self, Pharmacy Records  torsemide  (DEMADEX ) 20 MG tablet [id number]  Take 1 tablet (20 mg total) by mouth 2 (two) times daily.  Patient taking differently: Take 20 mg by mouth once.   Willette Adriana LABOR, MD  Expired 02/03/23 2359 Self            Recommendation:   PCP Follow-up Specialty provider follow-up :Pulmonary new patient visit 06/02/24 Continue Current Plan of Care  Follow Up Plan:   Telephone follow up appointment date/time:  05/26/24 at 3:30  Josette Pellet, RN, BSN [place]  [facility] RN Care Manager Direct Dial: [phone]  Fax: [phone]

## 2024-05-10 NOTE — Patient Instructions (Signed)
 Visit Information  Thank you for taking time to visit with me today. Please don't hesitate to contact me if I can be of assistance to you before our next scheduled appointment.  Our next appointment is by telephone on 05/26/24 at 3:30 Please call the care guide team at [phone] if you need to cancel or reschedule your appointment.   Following is a copy of your care plan:   Goals Addressed             This Visit's Progress    VBCI RN Care Plan: CHF       Problems:  Chronic Disease Management support and education needs related to CHF  Goal: Over the next 6 months the Patient will demonstrate Ongoing health management independence as evidenced by no ED visits or hospitalizations for exacerbations documented in EMR        Over the next 3 months, patient will keep all medical appointments and will notify provider of any new or worsening symptoms Over the next 30 days, the patient will continue to take medications as prescribed  Interventions:   Heart Failure Interventions: Provided education on low sodium diet Reviewed role of diuretics in prevention of fluid overload and management of heart failure; Discussed the importance of keeping all appointments with provider Assessed social determinant of health barriers  Discussed lower extremity edema.  Patient has swelling in both feet and lower legs daily. Swelling resolves over night. She props them up when seated, but doesn't sit for long periods. She's active around her home.  Encouraged to monitor for new or worsening symptoms and to notify provider if any noted Discussed use of diuretics twice a day. 2nd dose is around 4PM. It does not affect her sleep.  Patient Self-Care Activities:  Attend all scheduled provider appointments Call provider office for new concerns or questions  Take medications as prescribed   keep legs up while sitting watch for swelling in feet, ankles and legs every day  Plan:  Telephone follow up  appointment with care management team member scheduled for:  05/26/24          VBCI RN Care Plan: COPD       Problems:  Chronic Disease Management support and education needs related to COPD  Goal: Over the next 6 months the Patient will demonstrate Improved adherence to prescribed treatment plan for COPD as evidenced by patient verbalization of improvement in SOB Over the next 30 days, patient will keep all medical appointments, especially new patient appointment with pulmonologist  Over the next 30 days, patient will reach out to provider with any new or worsening symptoms  Interventions:   COPD Interventions: Advised patient to track and manage COPD triggers Discussed Pulmonary Rehab and offered to assist with referral placement Discussed the importance of adequate rest and management of fatigue with COPD Provided instruction about proper use of medications used for management of COPD including inhalers Reviewed upcoming appointment with Dr Darlean, Pulmonologist, on 06/02/24 in the [place] office and verified that she will be able to drive herself to that appointment Reviewed office notes and medication list in [facility] EMR and reconciled medication list in [facility] with their current list and what patient has in her home Medications are prepackaged and delivered by [facility] She takes her medications as prescribed Explained the difference between maintenance inhalers and rescue inhaler and how they are to be used Discussed using a pulse oximeter as needed to measure her oxygen  level  She has one but isn't  sure where it is. She can purchase another if she can't find it Encouraged to reach out to provider with any new or worsening symptoms Encouraged to reach out to [facility] as needed Discussed breathing exercises and breathing techniques  Patient Self-Care Activities:  Attend all scheduled provider appointments Call provider office for new concerns or questions  Take  medications as prescribed   do breathing exercises every day  Plan:  Telephone follow up appointment with care management team member scheduled for:  05/26/24             Please call the [facility]: [phone] call 911 if you are experiencing a Mental Health or Behavioral Health Crisis or need someone to talk to.  Patient verbalized understanding of Care plan and visit instructions communicated this visit  Josette Pellet, RN, BSN [place]  [facility] RN Care Manager Direct Dial: [phone]  Fax: [phone]

## 2024-05-21 ENCOUNTER — Other Ambulatory Visit: Payer: Self-pay | Admitting: Licensed Clinical Social Worker

## 2024-05-21 NOTE — Patient Outreach (Signed)
 Complex Care Management   Visit Note  05/21/2024  Name:  Debbie [name] MRN: [id number] DOB: [birth year]  Situation: Referral received for Complex Care Management related to Mental/Behavioral Health diagnosis level of care concerns. I obtained verbal consent from Patient.  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 s/p CABG in 2006 b. NSTEMI in 11/2021 with DES to LM and LCx and repeat cath later in admission with DES to SVG-D1   NSTEMI (non-ST elevated myocardial infarction) (HCC) 11/22/2021   Osteoporosis    Renal insufficiency    Vaginal vault prolapse     Assessment: Patient Reported Symptoms:  Cognitive Cognitive Status: Able to follow simple commands Cognitive/Intellectual Conditions Management [RPT]: None reported or documented in medical history or problem list   Health Maintenance Behaviors: Annual physical exam Healing Pattern: Slow Health Facilitated by: Stress management, Rest  Neurological Neurological Review of Symptoms: Weakness Neurological Management Strategies: Coping strategies, Adequate rest, Routine screening Neurological Self-Management Outcome: 3 (uncertain)  HEENT HEENT Symptoms Reported: No symptoms reported HEENT Management Strategies: Routine screening HEENT Self-Management Outcome: 4 (good)    Cardiovascular Cardiovascular Symptoms Reported: Swelling in legs or feet Does patient have uncontrolled Hypertension?: Yes Is patient checking  Blood Pressure at home?: Yes Cardiovascular Management Strategies: Medication therapy, Fluid modification, Routine screening Cardiovascular Self-Management Outcome: 4 (good)  Respiratory Respiratory Symptoms Reported: Shortness of breath Additional Respiratory Details: SOB with exertion. Recommended pulmonary rehab. Patient has not had this before. RN Care Manager to communicate this to Dr Darlean. Scheduled for new patient appointment on 06/02/24. Verified that patient is able to drive herself to this appointment since it is in [place]. Respiratory Management Strategies: Breathing exercise, Breathing techniques, Routine screening, Medication therapy Respiratory Self-Management Outcome: 2 (bad)  Endocrine Endocrine Symptoms Reported: No symptoms reported Is patient diabetic?: No Endocrine Self-Management Outcome: 4 (good)  Gastrointestinal Gastrointestinal Symptoms Reported: No symptoms reported Gastrointestinal Management Strategies: Coping strategies, Adequate rest Gastrointestinal Self-Management Outcome: 4 (good)    Genitourinary Genitourinary Symptoms Reported: No symptoms reported    Integumentary Integumentary Symptoms Reported: No symptoms reported Skin Management Strategies: Routine screening Skin Self-Management Outcome: 4 (good)  Musculoskeletal Musculoskelatal Symptoms Reviewed: Joint pain, Unsteady gait, Weakness Musculoskeletal Management Strategies: Routine screening Musculoskeletal Self-Management Outcome: 2 (bad) Falls in the past year?: No Number of falls in past year: 1 or less Was there an injury with Fall?: No Fall Risk Category Calculator: 0 Patient Fall Risk Level: Low Fall Risk    Psychosocial Psychosocial Symptoms Reported: No symptoms reported Other Psychosocial Conditions: Memory Loss, ALF placement set for 05/22/24 Behavioral Management Strategies: Adequate rest, Coping strategies Behavioral Health Self-Management Outcome: 4 (good) Behavioral Health Comment: I  am excited to be moving into an assisted living facility tomorrow Major Change/Loss/Stressor/Fears (CP): Medical condition, self      05/21/2024    PHQ2-9 Depression Screening   Little interest or pleasure in doing things Not  at all  Feeling down, depressed, or hopeless Several days  PHQ-2 - Total Score 1  Trouble falling or staying asleep, or sleeping too much    Feeling tired or having little energy    Poor appetite or overeating     Feeling bad about yourself - or that you are a failure or have let yourself or your family down    Trouble concentrating on things, such as reading the newspaper or watching television    Moving or speaking so slowly that other people could have noticed.  Or the opposite - being so fidgety or restless that you have been moving around a lot more than usual    Thoughts that you would be better off dead, or hurting yourself in some way    PHQ2-9 Total Score    If you checked off any problems, how difficult have these problems made it for you to do your work, take care of things at home, or get along with other people    Depression Interventions/Treatment      There were no vitals filed for this visit.    Medications Reviewed Today     Reviewed by Merlynn Lyle CROME, LCSW (Social Worker) on 05/21/24 at 1330  Med List Status: <None>   Medication Order Taking? Sig Documenting Provider Last Dose Status Informant  acetaminophen  (TYLENOL ) 650 MG CR tablet [id number]  Take 650 mg by mouth in the morning. [provider]  Active Self, Pharmacy Records  atorvastatin  (LIPITOR ) 80 MG tablet [id number]  Take 1 tablet (80 mg total) by mouth at bedtime. Pearlean Manus, MD  Active Self, Pharmacy Records  bisoprolol  (ZEBETA ) 5 MG tablet [id number]  Take 2.5 mg by mouth every morning. [provider]  Active   clopidogrel  (PLAVIX ) 75 MG tablet [id number]  Take 1 tablet (75 mg total) by mouth daily. Debera Jayson MATSU, MD  Active Self, Pharmacy Records   furosemide  (LASIX ) 40 MG tablet [id number]  Take 80 mg by mouth 2 (two) times daily. [provider]  Active   Glycopyrrolate -Formoterol  (BEVESPI IN) [id number]  Inhale 1 puff into the lungs 2 (two) times daily. [provider]  Active            Med Note SCHUYLER JOSETTE LOISE Pablo May 10, 2024 12:19 PM) Gets samples from PCP office  isosorbide  mononitrate (IMDUR ) 30 MG 24 hr tablet [id number]  Take 1 tablet (30 mg total) by mouth daily. Willette Adriana LABOR, MD  Active   levocetirizine (XYZAL) 5 MG tablet [id number]  Take 5 mg by mouth every evening. Equate levocetirizine. [provider]  Active Self, Pharmacy Records  levothyroxine  (SYNTHROID ) 88 MCG tablet [id number]  Take 88 mcg by mouth daily. [provider]  Active Self, Pharmacy Records  nitroGLYCERIN  (NITROSTAT ) 0.4 MG SL tablet [id number]  Place 1 tablet (0.4 mg total) under the tongue every 5 (five) minutes x 3 doses as needed for chest pain (if no relief after 3rd dose, call 911 or proceed to the ED for an evaluation). Richarda Prentice CROME Mickey., NP  Active Self, Pharmacy Records           Med Note [facility], JON HERO   Fri Nov 23, 2021 12:14 PM) Pt states she keeps in her pocket book. No fill history on dispense report/Dr. First - may be expired.  pantoprazole  (PROTONIX ) 40 MG tablet [id number]  Take 40 mg by mouth daily. [provider]  Active Self, Pharmacy Records  potassium chloride  (KLOR-CON  M)  10 MEQ tablet [id number]  Take 1 tablet (10 mEq total) by mouth daily.  Patient taking differently: Take 20 mEq by mouth daily.   Vicci Afton CROME, MD  Active Self, Pharmacy Records  tamsulosin  (FLOMAX ) 0.4 MG CAPS capsule [id number]  Take 0.4 mg by mouth. [provider]  Active Self, Pharmacy Records  torsemide  (DEMADEX ) 20 MG tablet [id number] No Take 1 tablet (20 mg total) by mouth 2 (two) times daily.  Patient taking differently: Take 20 mg by mouth once.   Willette Adriana LABOR, MD Taking Expired  02/03/23 2359 Self  Med List Note Schuyler Josette SAILOR, RN 05/10/24 1142): Prepackaged medications are delivered from [facility]            Recommendation:   PCP Follow-up Continue Current Plan of Care  Follow Up Plan:   Telephone follow-up in 1 month  [name], BSW, MSW, LCSW Licensed Clinical Social Worker [employer]   [email or web address] Direct Dial: [phone]

## 2024-05-21 NOTE — Patient Instructions (Signed)
 Visit Information  Thank you for taking time to visit with me today. Please don't hesitate to contact me if I can be of assistance to you before our next scheduled appointment.  Our next appointment is by telephone on 06/03/24 at 1015 am Please call the care guide team at [phone] if you need to cancel or reschedule your appointment.   Following is a copy of your care plan:   Goals Addressed             This Visit's Progress    LCSW VBCI Social Work Care Plan       Problems:   Cognitive Deficits and Disease Management support and education needs related to Anxiety with memory loss and Grief  CSW Clinical Goal(s):   Over the next 90 days the Patient will attend all scheduled medical appointments as evidenced by patient report and care team review of appointment completion in electronic MEDICAL RECORD NUMBERby VBCI LCSW demonstrate a reduction in symptoms related to Grief  .  Interventions:  Dementia Care:   Current level of care: home, alone Evaluation of patient safety in current living environment and review of Dementia resources and support (support network includes friend, daughter and son) ADL's Assessed needs, level of care concerns, how currently meeting needs and barriers to care Discussed family support and building support system : Friend comes by often to support patient's care giving needs Discussed private pay options for personal care needs (Patient does not wish to consider this out of pocket expense resource at this time and does not have Medicaid for [facility]) Active listening / Reflection utilized Behavioral Activation reviewed Consideration on in-home help encouraged : options discussed Financial Risk Analyst / information provided Discussed caregiver resources and support: Pt was encouraged to increase family support Emotional Support Provided Motivational Interviewing employed PHQ2/PHQ9 completed Problem Solving /Task Center strategies reviewed Quality of  sleep assessed & Sleep Hygiene techniques promoted Solution-Focued Strategies employed: Suicidal Ideation/Homicidal Ideation assessed: No SI/HI Patient reports SOB during 04/29/24 visit and was advised to contact her pulmonologist nurse triage line for direction. Number provided. Appointment confirmed with patient today on 12/26. Patient reports her son or daughter will transport her to this appointment. Patient agreeable to [facility] referral which was placed today Patient will be placed at [facility] tomorrow on 05/22/24. Patient will no longer have land line and prefers all calls to be made to her mobile device to ensure stable transition from home to ALF.  Patient Goals/Self-Care Activities:  Increase coping skills, healthy habits, self-management skills, and stress reduction  Plan:   The care management team will reach out to the patient again over the next 30 days.        Please call the Suicide and Crisis Lifeline: 988 call the [place]  National Suicide Prevention Lifeline: (601)107-6421 or TTY: 954 015 1884 TTY 414-180-7178) to talk to a trained counselor call 1-800-273-TALK (toll free, 24 hour hotline) go to [facility] [address] [place] [phone]) call the [facility] Crisis Line: (438) [phone] call 911 if you are experiencing a Mental Health or Behavioral Health Crisis or need someone to talk to.  Patient verbalized understanding of Care plan and visit instructions communicated this visit  [name], BSW, MSW, LCSW Licensed Clinical Social Worker [employer]   [email or web address] Direct Dial: [phone]

## 2024-05-26 ENCOUNTER — Telehealth: Payer: Self-pay | Admitting: *Deleted

## 2024-05-26 ENCOUNTER — Encounter: Payer: Self-pay | Admitting: *Deleted

## 2024-05-26 NOTE — Patient Instructions (Signed)
 Debbie [name] - I am sorry I was unable to reach you today for our scheduled appointment. I work with [name], MD and am calling to support your healthcare needs. Please contact me at [phone] at your earliest convenience. I look forward to speaking with you soon.   Thank you,   Josette Pellet, RN, BSN [place]  [facility] RN Care Manager Direct Dial: [phone]  Fax: [phone]

## 2024-05-27 ENCOUNTER — Telehealth: Payer: Self-pay | Admitting: *Deleted

## 2024-06-02 ENCOUNTER — Ambulatory Visit: Admitting: Internal Medicine

## 2024-06-02 NOTE — Progress Notes (Unsigned)
 "   Debbie [name], female    DOB: Aug 13, 1940    MRN: [id number]   Brief patient profile:  84  yo*** *** referred to pulmonary clinic in [place]  06/02/2024 by *** for ***   Pt not previously seen by PCCM service.     History of Present Illness  06/02/2024  Pulmonary/ 1st office eval/ Debbie [name] / [place] [facility]  No chief complaint on file.    Dyspnea:  *** Cough: *** Sleep: *** SABA use: *** 02: *** LDSCT:***  No obvious day to day or daytime pattern/variability or assoc excess/ purulent sputum or mucus plugs or hemoptysis or cp or chest tightness, subjective wheeze or overt sinus or hb symptoms.    Also denies any obvious fluctuation of symptoms with weather or environmental changes or other aggravating or alleviating factors except as outlined above   No unusual exposure hx or h/o childhood pna/ asthma or knowledge of premature birth.  Current Allergies, Complete Past Medical History, Past Surgical History, Family History, and Social History were reviewed in [employer] record.  ROS  The following are not active complaints unless bolded Hoarseness, sore throat, dysphagia, dental problems, itching, sneezing,  nasal congestion or discharge of excess mucus or purulent secretions, ear ache,   fever, chills, sweats, unintended wt loss or wt gain, classically pleuritic or exertional cp,  orthopnea pnd or arm/hand swelling  or leg swelling, presyncope, palpitations, abdominal pain, anorexia, nausea, vomiting, diarrhea  or change in bowel habits or change in bladder habits, change in stools or change in urine, dysuria, hematuria,  rash, arthralgias, visual complaints, headache, numbness, weakness or ataxia or problems with walking or coordination,  change in mood or  memory.            Outpatient Medications Prior to Visit  Medication Sig Dispense Refill   acetaminophen  (TYLENOL ) 650 MG CR tablet Take 650 mg by mouth in the morning.     atorvastatin  (LIPITOR )  80 MG tablet Take 1 tablet (80 mg total) by mouth at bedtime. 30 tablet 3   bisoprolol  (ZEBETA ) 5 MG tablet Take 2.5 mg by mouth every morning.     clopidogrel  (PLAVIX ) 75 MG tablet Take 1 tablet (75 mg total) by mouth daily. 30 tablet 0   furosemide  (LASIX ) 40 MG tablet Take 80 mg by mouth 2 (two) times daily.     Glycopyrrolate -Formoterol  (BEVESPI IN) Inhale 1 puff into the lungs 2 (two) times daily.     isosorbide  mononitrate (IMDUR ) 30 MG 24 hr tablet Take 1 tablet (30 mg total) by mouth daily. 30 tablet 1   levocetirizine (XYZAL) 5 MG tablet Take 5 mg by mouth every evening. Equate levocetirizine.     levothyroxine  (SYNTHROID ) 88 MCG tablet Take 88 mcg by mouth daily.     nitroGLYCERIN  (NITROSTAT ) 0.4 MG SL tablet Place 1 tablet (0.4 mg total) under the tongue every 5 (five) minutes x 3 doses as needed for chest pain (if no relief after 3rd dose, call 911 or proceed to the ED for an evaluation). 25 tablet 2   pantoprazole  (PROTONIX ) 40 MG tablet Take 40 mg by mouth daily.     potassium chloride  (KLOR-CON  M) 10 MEQ tablet Take 1 tablet (10 mEq total) by mouth daily. (Patient taking differently: Take 20 mEq by mouth daily.) 120 tablet 3   tamsulosin  (FLOMAX ) 0.4 MG CAPS capsule Take 0.4 mg by mouth.     torsemide  (DEMADEX ) 20 MG tablet Take 1 tablet (20 mg total)  by mouth 2 (two) times daily. (Patient taking differently: Take 20 mg by mouth once.) 60 tablet 2   No facility-administered medications prior to visit.    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 s/p CABG in 2006 b. NSTEMI in 11/2021 with DES to LM and LCx and repeat cath later in admission with DES to SVG-D1   NSTEMI (non-ST elevated myocardial infarction) (HCC) 11/22/2021   Osteoporosis    Renal insufficiency    Vaginal vault prolapse       Objective:     There were no vitals taken for this visit.         Assessment       "

## 2024-06-03 ENCOUNTER — Other Ambulatory Visit: Payer: Self-pay | Admitting: Licensed Clinical Social Worker

## 2024-06-03 ENCOUNTER — Telehealth: Admitting: Licensed Clinical Social Worker

## 2024-06-03 NOTE — Patient Instructions (Signed)
 Visit Information  Thank you for taking time to visit with me today. Please don't hesitate to contact me if I can be of assistance to you before our next scheduled appointment.  Our next appointment is by telephone on 06/17/24 at 1045 am Please call the care guide team at [phone] if you need to cancel or reschedule your appointment.   Following is a copy of your care plan:   Goals Addressed             This Visit's Progress    LCSW VBCI Social Work Care Plan       Problems:   Cognitive Deficits and Disease Management support and education needs related to Anxiety with memory loss and Grief  CSW Clinical Goal(s):   Over the next 90 days the Patient will attend all scheduled medical appointments as evidenced by patient report and care team review of appointment completion in electronic MEDICAL RECORD NUMBERby VBCI LCSW demonstrate a reduction in symptoms related to Grief  .  Interventions:  Dementia Care:   Current level of care: home, alone Evaluation of patient safety in current living environment and review of Dementia resources and support (support network includes friend, daughter and son) ADL's Assessed needs, level of care concerns, how currently meeting needs and barriers to care Discussed family support and building support system : Friend comes by often to support patient's care giving needs Discussed private pay options for personal care needs (Patient does not wish to consider this out of pocket expense resource at this time and does not have Medicaid for [facility]) Active listening / Reflection utilized Behavioral Activation reviewed Consideration on in-home help encouraged : options discussed Financial Risk Analyst / information provided Discussed caregiver resources and support: Pt was encouraged to increase family support Emotional Support Provided Motivational Interviewing employed PHQ2/PHQ9 completed Problem Solving /Task Center strategies reviewed Quality of  sleep assessed & Sleep Hygiene techniques promoted Solution-Focued Strategies employed: Suicidal Ideation/Homicidal Ideation assessed: No SI/HI Patient reports SOB during 04/29/24 visit and was advised to contact her pulmonologist nurse triage line for direction. Number provided. Appointment confirmed with patient today on 12/26. Patient reports her son or daughter will transport her to this appointment. Patient agreeable to [facility] referral which was placed today Patient will be placed at [facility]F tomorrow on 05/22/24. Patient will no longer have land line and prefers all calls to be made to her mobile device to ensure stable transition from home to ALF. [facility] placement on 05/22/24 confirmed. However, they do NOT assist with medication management and patient admits to ongoing issues with remembering to take her medications. Patient was advised to consider private pay CNA medication management assistance at facility or to ask family to come daily to facility for assistance.   Patient Goals/Self-Care Activities:  Increase coping skills, healthy habits, self-management skills, and stress reduction  Plan:   The care management team will reach out to the patient again over the next 30 days.        Please call the Suicide and Crisis Lifeline: 988 call the [place]  National Suicide Prevention Lifeline: 918-449-8097 or TTY: 319-712-8408 TTY (858)888-5597) to talk to a trained counselor call 1-800-273-TALK (toll free, 24 hour hotline) go to [facility] [address] [place] [phone]) call the [facility] Crisis Line: [phone] call 911 if you are experiencing a Mental Health or Behavioral Health Crisis or need someone to talk to.  Patient verbalized understanding of Care plan and visit  instructions communicated this visit  [name], BSW, MSW, LCSW Licensed Clinical Social Worker [employer]   [email or web address] Direct Dial: [phone]

## 2024-06-03 NOTE — Patient Outreach (Signed)
 Complex Care Management   Visit Note  06/03/2024  Name:  Debbie [name] MRN: [id number] DOB: [birth year]  Situation: Referral received for Complex Care Management related to Mental/Behavioral Health diagnosis dementia/depression. I obtained verbal consent from Patient.  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 s/p CABG in 2006 b. NSTEMI in 11/2021 with DES to LM and LCx and repeat cath later in admission with DES to SVG-D1   NSTEMI (non-ST elevated myocardial infarction) (HCC) 11/22/2021   Osteoporosis    Renal insufficiency    Vaginal vault prolapse     Assessment: Patient Reported Symptoms:  Cognitive Cognitive Status: Able to follow simple commands, Alert and oriented to person, place, and time Cognitive/Intellectual Conditions Management [RPT]: None reported or documented in medical history or problem list   Health Maintenance Behaviors: Annual physical exam Healing Pattern: Slow Health Facilitated by: Rest, Stress management  Neurological Neurological Review of Symptoms: Weakness Neurological Management Strategies: Coping strategies, Adequate rest, Routine screening Neurological Self-Management Outcome: 3 (uncertain)  HEENT HEENT Symptoms Reported: No symptoms reported HEENT Management Strategies: Routine screening    Cardiovascular Cardiovascular Symptoms Reported: Swelling in legs or feet Does patient have uncontrolled Hypertension?: Yes Is patient checking  Blood Pressure at home?: Yes    Respiratory Respiratory Symptoms Reported: Shortness of breath, Dry cough    Endocrine Is patient diabetic?: No Endocrine Self-Management Outcome: 4 (good)  Gastrointestinal Gastrointestinal Symptoms Reported: No symptoms reported Gastrointestinal Management Strategies: Adequate rest, Coping strategies Gastrointestinal Self-Management Outcome: 4 (good)    Genitourinary Genitourinary Symptoms Reported: No symptoms reported    Integumentary Integumentary Symptoms Reported: No symptoms reported    Musculoskeletal Musculoskelatal Symptoms Reviewed: Joint pain, Unsteady gait, Weakness Musculoskeletal Management Strategies: Routine screening      Psychosocial Psychosocial Symptoms Reported: No symptoms reported Other Psychosocial Conditions: [facility] placement on 05/22/24 confirmed Behavioral Management Strategies: Adequate rest, Coping strategies Behavioral Health Self-Management Outcome: 4 (good) Major Change/Loss/Stressor/Fears (CP): Denies Techniques to Cope with Loss/Stress/Change: Not applicable Quality of Family Relationships: involved, helpful, supportive Do you feel physically threatened by others?: No    06/03/2024    PHQ2-9 Depression Screening   Little interest or pleasure in doing things Not at all  Feeling down, depressed, or hopeless Not at all  PHQ-2 - Total Score 0  Trouble falling or staying asleep, or sleeping too much    Feeling tired or having little energy    Poor appetite or overeating     Feeling bad about yourself - or that you are a failure or have let yourself or your family down    Trouble concentrating on things, such as reading the newspaper or watching television    Moving or speaking so slowly that other people could have noticed.  Or the opposite - being so fidgety or restless that you have been moving around a lot more than usual    Thoughts  that you would be better off dead, or hurting yourself in some way     PHQ2-9 Total Score    If you checked off any problems, how difficult have these problems made it for you to do your work, take care of things at home, or get along with other people    Depression Interventions/Treatment      There were no vitals filed for this visit.    Medications Reviewed Today     Reviewed by Merlynn Lyle CROME, LCSW (Social Worker) on 06/03/24 at [id number]  Med List Status: <None>   Medication Order Taking? Sig Documenting Provider Last Dose Status Informant  acetaminophen  (TYLENOL ) 650 MG CR tablet [id number]  Take 650 mg by mouth in the morning. [provider]  Active Self, Pharmacy Records  atorvastatin  (LIPITOR ) 80 MG tablet [id number]  Take 1 tablet (80 mg total) by mouth at bedtime. Pearlean Manus, MD  Active Self, Pharmacy Records  bisoprolol  (ZEBETA ) 5 MG tablet [id number]  Take 2.5 mg by mouth every morning. [provider]  Active   clopidogrel  (PLAVIX ) 75 MG tablet [id number]  Take 1 tablet (75 mg total) by mouth daily. Debera Jayson MATSU, MD  Active Self, Pharmacy Records  furosemide  (LASIX ) 40 MG tablet [id number]  Take 80 mg by mouth 2 (two) times daily. [provider]  Active   Glycopyrrolate -Formoterol  (BEVESPI IN) [id number]  Inhale 1 puff into the lungs 2 (two) times daily. [provider]  Active            Med Note SCHUYLER JOSETTE LOISE Pablo May 10, 2024 12:19 PM) Gets samples from PCP office  isosorbide  mononitrate (IMDUR ) 30 MG 24 hr tablet [id number]  Take 1 tablet (30 mg total) by mouth daily. Willette Adriana LABOR, MD  Active   levocetirizine (XYZAL) 5 MG tablet [id number]  Take 5 mg by mouth every evening. Equate levocetirizine. [provider]  Active Self, Pharmacy Records  levothyroxine  (SYNTHROID ) 88 MCG tablet [id number]  Take 88 mcg by mouth daily. [provider]  Active Self, Pharmacy Records  nitroGLYCERIN  (NITROSTAT ) 0.4 MG SL tablet [id number]  Place 1 tablet (0.4 mg total) under the tongue every 5  (five) minutes x 3 doses as needed for chest pain (if no relief after 3rd dose, call 911 or proceed to the ED for an evaluation). Richarda Prentice CROME Mickey., NP  Active Self, Pharmacy Records           Med Note [facility], JON HERO   Fri Nov 23, 2021 12:14 PM) Pt states she keeps in her pocket book. No fill history on dispense report/Dr. First - may be expired.  pantoprazole  (PROTONIX ) 40 MG tablet [id number]  Take 40 mg by mouth daily. [provider]  Active Self, Pharmacy Records  potassium chloride  (KLOR-CON  M) 10 MEQ tablet [id number]  Take 1 tablet (10 mEq total) by mouth daily.  Patient taking differently: Take 20 mEq by mouth daily.   Vicci Afton CROME, MD  Active Self, Pharmacy Records  tamsulosin  (FLOMAX ) 0.4 MG CAPS capsule [id number]  Take 0.4 mg by mouth. [provider]  Active Self, Pharmacy Records  torsemide  (DEMADEX ) 20 MG tablet [id number] No Take 1 tablet (20 mg total) by mouth 2 (two) times daily.  Patient taking differently: Take 20 mg by mouth once.   Willette Adriana LABOR, MD Taking Expired 02/03/23 2359 Self  Med List Note Schuyler Josette LOISE, RN 05/10/24 1142): Prepackaged medications are delivered from [facility]  Recommendation:   PCP Follow-up Continue Current Plan of Care  Follow Up Plan:   Telephone follow-up in 1 month  [name], BSW, MSW, LCSW Licensed Clinical Social Worker [employer]   [facility] [email or web address] Direct Dial: [phone]

## 2024-06-17 ENCOUNTER — Other Ambulatory Visit: Payer: Self-pay | Admitting: Licensed Clinical Social Worker

## 2024-06-17 NOTE — Patient Instructions (Signed)
 Visit Information  Thank you for taking time to visit with me today. Please don't hesitate to contact me if I can be of assistance to you before our next scheduled appointment.  Our next appointment is by telephone on 07/15/24 at 11 Please call the care guide team at [phone] if you need to cancel or reschedule your appointment.   Following is a copy of your care plan:   Goals Addressed             This Visit's Progress    LCSW VBCI Social Work Care Plan       Problems:   Cognitive Deficits and Disease Management support and education needs related to Anxiety with memory loss and Grief  CSW Clinical Goal(s):   Over the next 90 days the Patient will attend all scheduled medical appointments as evidenced by patient report and care team review of appointment completion in electronic MEDICAL RECORD NUMBERby VBCI LCSW demonstrate a reduction in symptoms related to Grief  .  Interventions:  Dementia Care:   Current level of care: home, alone Evaluation of patient safety in current living environment and review of Dementia resources and support (support network includes friend, daughter and son) ADL's Assessed needs, level of care concerns, how currently meeting needs and barriers to care Discussed family support and building support system : Friend comes by often to support patient's care giving needs Discussed private pay options for personal care needs (Patient does not wish to consider this out of pocket expense resource at this time and does not have Medicaid for [facility]) Active listening / Reflection utilized Behavioral Activation reviewed Consideration on in-home help encouraged : options discussed Financial Risk Analyst / information provided Discussed caregiver resources and support: Pt was encouraged to increase family support Emotional Support Provided Motivational Interviewing employed PHQ2/PHQ9 completed Problem Solving /Task Center strategies reviewed Quality of sleep  assessed & Sleep Hygiene techniques promoted Solution-Focued Strategies employed: Suicidal Ideation/Homicidal Ideation assessed: No SI/HI Patient reports SOB during 04/29/24 visit and was advised to contact her pulmonologist nurse triage line for direction. Number provided. Appointment confirmed with patient today on 12/26. Patient reports her son or daughter will transport her to this appointment. Patient agreeable to [facility] referral which was placed today Patient will be placed at [facility]F tomorrow on 05/22/24. Patient will no longer have land line and prefers all calls to be made to her mobile device to ensure stable transition from home to ALF. [facility] placement on 05/22/24 confirmed. However, they do NOT assist with medication management and patient admits to ongoing issues with remembering to take her medications. Patient was advised to consider private pay CNA medication management assistance at facility or to ask family to come daily to facility for assistance. 06/17/24- Patient was a no show for her pulmonologist appointment this month but did reschedule this for 07/07/24 and was advised to arrange transportation prior. Patient reports successfully making friends at ALF. She was advised to contact [place] PCP office to reschedule in office visit for appropriate follow up.  Patient Goals/Self-Care Activities:  Increase coping skills, healthy habits, self-management skills, and stress reduction  Plan:   The care management team will reach out to the patient again over the next 30 days.        Please call the Suicide and Crisis Lifeline: 988 call the [place]  National Suicide Prevention Lifeline: 571-351-9750 or TTY: (320)848-5855 TTY 646-600-2683) to talk to a trained counselor call 1-800-273-TALK (toll free, 24 hour hotline)  go to [facility] [address], [place] [phone]) call the [facility] Crisis Line:  [phone] call 911 if you are experiencing a Mental Health or Behavioral Health Crisis or need someone to talk to.  Patient verbalized understanding of Care plan and visit instructions communicated this visit  [name], BSW, MSW, LCSW Licensed Clinical Social Worker [employer]   [email or web address] Direct Dial: [phone]

## 2024-06-17 NOTE — Patient Outreach (Signed)
 Complex Care Management   Visit Note  06/17/2024  Name:  Debbie [name] MRN: [id number] DOB: 1941-02-03  Situation: Referral received for Complex Care Management related to Mental/Behavioral Health diagnosis dementia. I obtained verbal consent from Patient.  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 s/p CABG in 2006 b. NSTEMI in 11/2021 with DES to LM and LCx and repeat cath later in admission with DES to SVG-D1   NSTEMI (non-ST elevated myocardial infarction) (HCC) 11/22/2021   Osteoporosis    Renal insufficiency    Vaginal vault prolapse     Assessment: Patient Reported Symptoms:  Cognitive Cognitive Status: Able to follow simple commands, Struggling with memory recall Cognitive/Intellectual Conditions Management [RPT]: None reported or documented in medical history or problem list   Health Maintenance Behaviors: Annual physical exam Healing Pattern: Slow Health Facilitated by: Rest, Stress management  Neurological Neurological Review of Symptoms: Weakness Neurological Management Strategies: Coping strategies  HEENT HEENT Symptoms Reported: No symptoms reported HEENT Management Strategies: Routine screening HEENT Self-Management Outcome: 3 (uncertain)    Cardiovascular Cardiovascular Symptoms Reported: Swelling in legs or feet Does patient have uncontrolled Hypertension?: Yes Is patient checking Blood Pressure at home?: Yes Cardiovascular Management Strategies:  Medication therapy, Fluid modification, Routine screening Cardiovascular Self-Management Outcome: 4 (good)  Respiratory Respiratory Symptoms Reported: Dry cough, Shortness of breath Respiratory Management Strategies: Breathing techniques, Breathing exercise, Routine screening, Medication therapy Respiratory Self-Management Outcome: 2 (bad)  Endocrine Endocrine Symptoms Reported: No symptoms reported Is patient diabetic?: No Endocrine Self-Management Outcome: 5 (very good)  Gastrointestinal Gastrointestinal Symptoms Reported: No symptoms reported      Genitourinary Genitourinary Symptoms Reported: Not assessed    Integumentary Integumentary Symptoms Reported: Not assessed    Musculoskeletal Musculoskelatal Symptoms Reviewed: Weakness, Unsteady gait, Joint pain Musculoskeletal Management Strategies: Routine screening      Psychosocial Psychosocial Symptoms Reported: No symptoms reported Behavioral Management Strategies: Adequate rest, Coping strategies Behavioral Health Self-Management Outcome: 4 (good) Major Change/Loss/Stressor/Fears (CP): Denies Quality of Family Relationships: involved Do you feel physically threatened by others?: No    06/17/2024    PHQ2-9 Depression Screening   Little interest or pleasure in doing things Not at all  Feeling down, depressed, or hopeless Not at all  PHQ-2 - Total Score 0  Trouble falling or staying asleep, or sleeping too much    Feeling tired or having little energy    Poor appetite or overeating     Feeling bad about yourself - or that you are a failure or have let yourself or your family down    Trouble concentrating on things, such as reading the newspaper or watching television    Moving or speaking so slowly that other people could have noticed.  Or the opposite - being so fidgety or restless that you have been moving around a lot more than usual    Thoughts that you would be better off  dead, or hurting yourself in some way    PHQ2-9  Total Score    If you checked off any problems, how difficult have these problems made it for you to do your work, take care of things at home, or get along with other people    Depression Interventions/Treatment      There were no vitals filed for this visit.    Medications Reviewed Today     Reviewed by Merlynn Lyle CROME, LCSW (Social Worker) on 06/17/24 at 0935  Med List Status: <None>   Medication Order Taking? Sig Documenting Provider Last Dose Status Informant  acetaminophen  (TYLENOL ) 650 MG CR tablet [id number]  Take 650 mg by mouth in the morning. [provider]  Active Self, Pharmacy Records  atorvastatin  (LIPITOR ) 80 MG tablet [id number]  Take 1 tablet (80 mg total) by mouth at bedtime. Pearlean Manus, MD  Active Self, Pharmacy Records  bisoprolol  (ZEBETA ) 5 MG tablet [id number]  Take 2.5 mg by mouth every morning. [provider]  Active   clopidogrel  (PLAVIX ) 75 MG tablet [id number]  Take 1 tablet (75 mg total) by mouth daily. Debera Jayson MATSU, MD  Active Self, Pharmacy Records  furosemide  (LASIX ) 40 MG tablet [id number]  Take 80 mg by mouth 2 (two) times daily. [provider]  Active   Glycopyrrolate -Formoterol  (BEVESPI IN) [id number]  Inhale 1 puff into the lungs 2 (two) times daily. [provider]  Active            Med Note SCHUYLER JOSETTE LOISE Pablo May 10, 2024 12:19 PM) Gets samples from PCP office  isosorbide  mononitrate (IMDUR ) 30 MG 24 hr tablet [id number]  Take 1 tablet (30 mg total) by mouth daily. Willette Adriana LABOR, MD  Active   levocetirizine (XYZAL) 5 MG tablet [id number]  Take 5 mg by mouth every evening. Equate levocetirizine. [provider]  Active Self, Pharmacy Records  levothyroxine  (SYNTHROID ) 88 MCG tablet [id number]  Take 88 mcg by mouth daily. [provider]  Active Self, Pharmacy Records  nitroGLYCERIN  (NITROSTAT ) 0.4 MG SL tablet [id number]  Place 1 tablet (0.4 mg total) under the tongue every 5 (five)  minutes x 3 doses as needed for chest pain (if no relief after 3rd dose, call 911 or proceed to the ED for an evaluation). Richarda Prentice CROME Mickey., NP  Active Self, Pharmacy Records           Med Note [facility], JON HERO   Fri Nov 23, 2021 12:14 PM) Pt states she keeps in her pocket book. No fill history on dispense report/Dr. First - may be expired.  pantoprazole  (PROTONIX ) 40 MG tablet [id number]  Take 40 mg by mouth daily. [provider]  Active Self, Pharmacy Records  potassium chloride  (KLOR-CON  M) 10 MEQ tablet [id number]  Take 1 tablet (10 mEq total) by mouth daily.  Patient taking differently: Take 20 mEq by mouth daily.   Vicci Afton CROME, MD  Active Self, Pharmacy Records  tamsulosin  (FLOMAX ) 0.4 MG CAPS capsule [id number]  Take 0.4 mg by mouth. [provider]  Active Self, Pharmacy Records  torsemide  (DEMADEX ) 20 MG tablet [id number] No Take 1 tablet (20 mg total) by mouth 2 (two) times daily.  Patient taking differently: Take 20 mg by mouth once.   Willette Adriana LABOR, MD Taking Expired 02/03/23 2359 Self  Med List Note Schuyler Josette LOISE, RN 05/10/24 1142): Prepackaged medications are delivered from [facility]  Recommendation:   PCP Follow-up Continue Current Plan of Care  Follow Up Plan:   Telephone follow-up in 1 month  [name], BSW, MSW, LCSW Licensed Clinical Social Worker [employer]   [facility] [email or web address] Direct Dial: [phone]

## 2024-07-07 ENCOUNTER — Ambulatory Visit: Admitting: Internal Medicine

## 2024-07-15 ENCOUNTER — Telehealth: Admitting: Licensed Clinical Social Worker
